# Patient Record
Sex: Female | Born: 1965 | Race: Black or African American | Hispanic: No | Marital: Single | Smoking: Current some day smoker
Health system: Southern US, Community
[De-identification: ages and names within clinical notes are randomized; demographics above are authoritative.]

## PROBLEM LIST (undated history)

## (undated) DIAGNOSIS — J449 Chronic obstructive pulmonary disease, unspecified: Secondary | ICD-10-CM

## (undated) DIAGNOSIS — R569 Unspecified convulsions: Secondary | ICD-10-CM

## (undated) DIAGNOSIS — D128 Benign neoplasm of rectum: Secondary | ICD-10-CM

## (undated) DIAGNOSIS — I509 Heart failure, unspecified: Secondary | ICD-10-CM

## (undated) DIAGNOSIS — G8929 Other chronic pain: Secondary | ICD-10-CM

## (undated) DIAGNOSIS — K222 Esophageal obstruction: Secondary | ICD-10-CM

## (undated) DIAGNOSIS — J939 Pneumothorax, unspecified: Secondary | ICD-10-CM

## (undated) DIAGNOSIS — E119 Type 2 diabetes mellitus without complications: Secondary | ICD-10-CM

## (undated) DIAGNOSIS — R200 Anesthesia of skin: Secondary | ICD-10-CM

## (undated) DIAGNOSIS — E669 Obesity, unspecified: Secondary | ICD-10-CM

## (undated) DIAGNOSIS — K635 Polyp of colon: Secondary | ICD-10-CM

## (undated) DIAGNOSIS — K5790 Diverticulosis of intestine, part unspecified, without perforation or abscess without bleeding: Secondary | ICD-10-CM

## (undated) DIAGNOSIS — I10 Essential (primary) hypertension: Secondary | ICD-10-CM

## (undated) DIAGNOSIS — M1990 Unspecified osteoarthritis, unspecified site: Secondary | ICD-10-CM

## (undated) HISTORY — DX: Chronic obstructive pulmonary disease, unspecified: J44.9

## (undated) HISTORY — DX: Diverticulosis of intestine, part unspecified, without perforation or abscess without bleeding: K57.90

## (undated) HISTORY — PX: TUBAL LIGATION: SHX77

## (undated) HISTORY — DX: Unspecified osteoarthritis, unspecified site: M19.90

## (undated) HISTORY — DX: Heart failure, unspecified: I50.9

## (undated) HISTORY — PX: CHEST TUBE INSERTION: SHX231

## (undated) HISTORY — DX: Polyp of colon: K63.5

## (undated) HISTORY — DX: Type 2 diabetes mellitus without complications: E11.9

## (undated) HISTORY — DX: Benign neoplasm of rectum: D12.8

## (undated) HISTORY — DX: Esophageal obstruction: K22.2

---

## 1998-01-05 ENCOUNTER — Emergency Department (HOSPITAL_COMMUNITY): Admission: EM | Admit: 1998-01-05 | Discharge: 1998-01-05 | Payer: Self-pay | Admitting: *Deleted

## 2004-03-16 ENCOUNTER — Emergency Department (HOSPITAL_COMMUNITY): Admission: EM | Admit: 2004-03-16 | Discharge: 2004-03-16 | Payer: Self-pay | Admitting: Family Medicine

## 2004-04-22 ENCOUNTER — Emergency Department (HOSPITAL_COMMUNITY): Admission: EM | Admit: 2004-04-22 | Discharge: 2004-04-23 | Payer: Self-pay

## 2004-07-23 ENCOUNTER — Emergency Department (HOSPITAL_COMMUNITY): Admission: EM | Admit: 2004-07-23 | Discharge: 2004-07-23 | Payer: Self-pay | Admitting: Emergency Medicine

## 2004-09-22 ENCOUNTER — Emergency Department (HOSPITAL_COMMUNITY): Admission: EM | Admit: 2004-09-22 | Discharge: 2004-09-22 | Payer: Self-pay | Admitting: Family Medicine

## 2005-01-11 ENCOUNTER — Emergency Department (HOSPITAL_COMMUNITY): Admission: EM | Admit: 2005-01-11 | Discharge: 2005-01-11 | Payer: Self-pay | Admitting: Emergency Medicine

## 2005-01-27 ENCOUNTER — Emergency Department (HOSPITAL_COMMUNITY): Admission: EM | Admit: 2005-01-27 | Discharge: 2005-01-27 | Payer: Self-pay | Admitting: Family Medicine

## 2005-01-29 ENCOUNTER — Emergency Department (HOSPITAL_COMMUNITY): Admission: EM | Admit: 2005-01-29 | Discharge: 2005-01-29 | Payer: Self-pay | Admitting: Family Medicine

## 2005-08-13 ENCOUNTER — Emergency Department (HOSPITAL_COMMUNITY): Admission: EM | Admit: 2005-08-13 | Discharge: 2005-08-13 | Payer: Self-pay | Admitting: Emergency Medicine

## 2006-06-09 ENCOUNTER — Ambulatory Visit: Payer: Self-pay | Admitting: Nurse Practitioner

## 2006-07-26 ENCOUNTER — Emergency Department (HOSPITAL_COMMUNITY): Admission: EM | Admit: 2006-07-26 | Discharge: 2006-07-26 | Payer: Self-pay | Admitting: Emergency Medicine

## 2006-07-30 ENCOUNTER — Emergency Department (HOSPITAL_COMMUNITY): Admission: EM | Admit: 2006-07-30 | Discharge: 2006-07-30 | Payer: Self-pay | Admitting: Family Medicine

## 2006-08-02 ENCOUNTER — Emergency Department (HOSPITAL_COMMUNITY): Admission: EM | Admit: 2006-08-02 | Discharge: 2006-08-02 | Payer: Self-pay | Admitting: Family Medicine

## 2006-08-04 ENCOUNTER — Emergency Department (HOSPITAL_COMMUNITY): Admission: EM | Admit: 2006-08-04 | Discharge: 2006-08-04 | Payer: Self-pay | Admitting: Family Medicine

## 2006-10-10 ENCOUNTER — Emergency Department (HOSPITAL_COMMUNITY): Admission: EM | Admit: 2006-10-10 | Discharge: 2006-10-10 | Payer: Self-pay | Admitting: Emergency Medicine

## 2006-12-09 ENCOUNTER — Emergency Department (HOSPITAL_COMMUNITY): Admission: EM | Admit: 2006-12-09 | Discharge: 2006-12-09 | Payer: Self-pay | Admitting: Emergency Medicine

## 2006-12-11 ENCOUNTER — Emergency Department (HOSPITAL_COMMUNITY): Admission: EM | Admit: 2006-12-11 | Discharge: 2006-12-11 | Payer: Self-pay | Admitting: Family Medicine

## 2007-03-11 ENCOUNTER — Emergency Department (HOSPITAL_COMMUNITY): Admission: EM | Admit: 2007-03-11 | Discharge: 2007-03-11 | Payer: Self-pay | Admitting: Emergency Medicine

## 2007-05-07 ENCOUNTER — Emergency Department (HOSPITAL_COMMUNITY): Admission: EM | Admit: 2007-05-07 | Discharge: 2007-05-07 | Payer: Self-pay | Admitting: Emergency Medicine

## 2007-05-19 ENCOUNTER — Emergency Department (HOSPITAL_COMMUNITY): Admission: EM | Admit: 2007-05-19 | Discharge: 2007-05-19 | Payer: Self-pay | Admitting: Emergency Medicine

## 2007-06-07 ENCOUNTER — Encounter: Admission: RE | Admit: 2007-06-07 | Discharge: 2007-09-05 | Payer: Self-pay | Admitting: Internal Medicine

## 2007-06-23 ENCOUNTER — Encounter: Admission: RE | Admit: 2007-06-23 | Discharge: 2007-06-23 | Payer: Self-pay | Admitting: Occupational Medicine

## 2008-02-13 ENCOUNTER — Emergency Department (HOSPITAL_COMMUNITY): Admission: EM | Admit: 2008-02-13 | Discharge: 2008-02-13 | Payer: Self-pay | Admitting: Emergency Medicine

## 2008-03-07 ENCOUNTER — Emergency Department (HOSPITAL_COMMUNITY): Admission: EM | Admit: 2008-03-07 | Discharge: 2008-03-08 | Payer: Self-pay | Admitting: Emergency Medicine

## 2008-03-11 ENCOUNTER — Emergency Department (HOSPITAL_COMMUNITY): Admission: EM | Admit: 2008-03-11 | Discharge: 2008-03-11 | Payer: Self-pay | Admitting: Family Medicine

## 2008-04-11 ENCOUNTER — Emergency Department (HOSPITAL_COMMUNITY): Admission: EM | Admit: 2008-04-11 | Discharge: 2008-04-11 | Payer: Self-pay | Admitting: Emergency Medicine

## 2008-04-26 ENCOUNTER — Emergency Department (HOSPITAL_COMMUNITY): Admission: EM | Admit: 2008-04-26 | Discharge: 2008-04-26 | Payer: Self-pay | Admitting: Family Medicine

## 2008-05-08 ENCOUNTER — Emergency Department (HOSPITAL_COMMUNITY): Admission: EM | Admit: 2008-05-08 | Discharge: 2008-05-08 | Payer: Self-pay | Admitting: Emergency Medicine

## 2008-05-17 ENCOUNTER — Emergency Department (HOSPITAL_COMMUNITY): Admission: EM | Admit: 2008-05-17 | Discharge: 2008-05-17 | Payer: Self-pay | Admitting: Family Medicine

## 2008-06-13 ENCOUNTER — Emergency Department (HOSPITAL_COMMUNITY): Admission: EM | Admit: 2008-06-13 | Discharge: 2008-06-13 | Payer: Self-pay | Admitting: Emergency Medicine

## 2008-06-15 ENCOUNTER — Emergency Department (HOSPITAL_COMMUNITY): Admission: EM | Admit: 2008-06-15 | Discharge: 2008-06-15 | Payer: Self-pay | Admitting: Emergency Medicine

## 2008-07-12 ENCOUNTER — Emergency Department (HOSPITAL_COMMUNITY): Admission: EM | Admit: 2008-07-12 | Discharge: 2008-07-12 | Payer: Self-pay | Admitting: Family Medicine

## 2008-11-23 ENCOUNTER — Emergency Department (HOSPITAL_COMMUNITY): Admission: EM | Admit: 2008-11-23 | Discharge: 2008-11-23 | Payer: Self-pay | Admitting: Emergency Medicine

## 2008-12-18 ENCOUNTER — Emergency Department (HOSPITAL_COMMUNITY): Admission: EM | Admit: 2008-12-18 | Discharge: 2008-12-19 | Payer: Self-pay | Admitting: Emergency Medicine

## 2008-12-21 ENCOUNTER — Emergency Department (HOSPITAL_COMMUNITY): Admission: EM | Admit: 2008-12-21 | Discharge: 2008-12-21 | Payer: Self-pay | Admitting: Emergency Medicine

## 2009-06-28 ENCOUNTER — Emergency Department (HOSPITAL_COMMUNITY): Admission: EM | Admit: 2009-06-28 | Discharge: 2009-06-29 | Payer: Self-pay | Admitting: Emergency Medicine

## 2009-08-29 ENCOUNTER — Emergency Department (HOSPITAL_COMMUNITY): Admission: EM | Admit: 2009-08-29 | Discharge: 2009-08-29 | Payer: Self-pay | Admitting: Family Medicine

## 2009-09-24 ENCOUNTER — Emergency Department (HOSPITAL_COMMUNITY): Admission: EM | Admit: 2009-09-24 | Discharge: 2009-09-24 | Payer: Self-pay | Admitting: Emergency Medicine

## 2010-08-14 ENCOUNTER — Emergency Department (HOSPITAL_COMMUNITY): Admission: EM | Admit: 2010-08-14 | Discharge: 2010-08-14 | Payer: Self-pay | Admitting: Emergency Medicine

## 2011-01-01 LAB — DIFFERENTIAL
Basophils Absolute: 0 10*3/uL (ref 0.0–0.1)
Basophils Relative: 0 % (ref 0–1)
Eosinophils Absolute: 0.5 10*3/uL (ref 0.0–0.7)
Eosinophils Relative: 5 % (ref 0–5)
Lymphocytes Relative: 32 % (ref 12–46)
Lymphs Abs: 3.3 10*3/uL (ref 0.7–4.0)
Monocytes Absolute: 0.5 10*3/uL (ref 0.1–1.0)
Monocytes Relative: 5 % (ref 3–12)
Neutro Abs: 5.8 10*3/uL (ref 1.7–7.7)
Neutrophils Relative %: 58 % (ref 43–77)

## 2011-01-01 LAB — GC/CHLAMYDIA PROBE AMP, GENITAL
Chlamydia, DNA Probe: NEGATIVE
GC Probe Amp, Genital: NEGATIVE

## 2011-01-01 LAB — CBC
HCT: 35.5 % — ABNORMAL LOW (ref 36.0–46.0)
Hemoglobin: 12.2 g/dL (ref 12.0–15.0)
MCHC: 34.4 g/dL (ref 30.0–36.0)
MCV: 93.7 fL (ref 78.0–100.0)
Platelets: 282 10*3/uL (ref 150–400)
RBC: 3.79 MIL/uL — ABNORMAL LOW (ref 3.87–5.11)
RDW: 13.9 % (ref 11.5–15.5)
WBC: 10.1 10*3/uL (ref 4.0–10.5)

## 2011-01-01 LAB — WET PREP, GENITAL
Trich, Wet Prep: NONE SEEN
Yeast Wet Prep HPF POC: NONE SEEN

## 2011-01-01 LAB — RPR: RPR Ser Ql: NONREACTIVE

## 2011-01-08 LAB — URINE MICROSCOPIC-ADD ON

## 2011-01-08 LAB — URINALYSIS, ROUTINE W REFLEX MICROSCOPIC
Bilirubin Urine: NEGATIVE
Glucose, UA: NEGATIVE mg/dL
Ketones, ur: NEGATIVE mg/dL
Nitrite: NEGATIVE
Protein, ur: NEGATIVE mg/dL
Specific Gravity, Urine: 1.037 — ABNORMAL HIGH (ref 1.005–1.030)
Urobilinogen, UA: 1 mg/dL (ref 0.0–1.0)
pH: 6 (ref 5.0–8.0)

## 2011-01-08 LAB — POCT PREGNANCY, URINE: Preg Test, Ur: NEGATIVE

## 2011-01-08 LAB — URINE CULTURE: Colony Count: 15000

## 2011-01-08 LAB — CULTURE, ROUTINE-ABSCESS

## 2011-04-15 ENCOUNTER — Emergency Department (HOSPITAL_COMMUNITY)
Admission: EM | Admit: 2011-04-15 | Discharge: 2011-04-16 | Disposition: A | Discharge status: Home / Self Care | Payer: No Typology Code available for payment source | Attending: Emergency Medicine | Admitting: Emergency Medicine

## 2011-04-15 DIAGNOSIS — M545 Low back pain, unspecified: Secondary | ICD-10-CM | POA: Insufficient documentation

## 2011-04-15 DIAGNOSIS — S335XXA Sprain of ligaments of lumbar spine, initial encounter: Secondary | ICD-10-CM | POA: Insufficient documentation

## 2011-04-15 DIAGNOSIS — M25559 Pain in unspecified hip: Secondary | ICD-10-CM | POA: Insufficient documentation

## 2011-04-15 DIAGNOSIS — M25519 Pain in unspecified shoulder: Secondary | ICD-10-CM | POA: Insufficient documentation

## 2011-04-15 DIAGNOSIS — M1990 Unspecified osteoarthritis, unspecified site: Secondary | ICD-10-CM | POA: Insufficient documentation

## 2011-04-15 DIAGNOSIS — G8929 Other chronic pain: Secondary | ICD-10-CM | POA: Insufficient documentation

## 2011-04-16 ENCOUNTER — Emergency Department (HOSPITAL_COMMUNITY): Payer: Self-pay

## 2011-05-10 ENCOUNTER — Emergency Department (HOSPITAL_COMMUNITY)
Admission: EM | Admit: 2011-05-10 | Discharge: 2011-05-11 | Disposition: A | Discharge status: Home / Self Care | Payer: Self-pay | Attending: Emergency Medicine | Admitting: Emergency Medicine

## 2011-05-10 DIAGNOSIS — Y92009 Unspecified place in unspecified non-institutional (private) residence as the place of occurrence of the external cause: Secondary | ICD-10-CM | POA: Insufficient documentation

## 2011-05-10 DIAGNOSIS — W269XXA Contact with unspecified sharp object(s), initial encounter: Secondary | ICD-10-CM | POA: Insufficient documentation

## 2011-05-10 DIAGNOSIS — S0180XA Unspecified open wound of other part of head, initial encounter: Secondary | ICD-10-CM | POA: Insufficient documentation

## 2011-05-14 ENCOUNTER — Emergency Department (HOSPITAL_COMMUNITY)
Admission: EM | Admit: 2011-05-14 | Discharge: 2011-05-14 | Disposition: A | Discharge status: Home / Self Care | Payer: Self-pay | Attending: Emergency Medicine | Admitting: Emergency Medicine

## 2011-05-14 DIAGNOSIS — Z4802 Encounter for removal of sutures: Secondary | ICD-10-CM | POA: Insufficient documentation

## 2011-06-25 LAB — DIFFERENTIAL
Basophils Absolute: 0
Basophils Relative: 0
Eosinophils Absolute: 0.2
Eosinophils Relative: 2
Lymphocytes Relative: 21
Lymphs Abs: 3.1
Monocytes Absolute: 0.7
Monocytes Relative: 5
Neutro Abs: 10.9 — ABNORMAL HIGH
Neutrophils Relative %: 73

## 2011-06-25 LAB — CBC
HCT: 36.7
Hemoglobin: 12.6
MCHC: 34.4
MCV: 91.7
Platelets: 305
RBC: 4
RDW: 13.6
WBC: 15.1 — ABNORMAL HIGH

## 2011-06-25 LAB — POCT I-STAT, CHEM 8
BUN: 8
Calcium, Ion: 1.22
Chloride: 101
Creatinine, Ser: 0.9
Glucose, Bld: 104 — ABNORMAL HIGH
HCT: 40
Hemoglobin: 13.6
Potassium: 3.4 — ABNORMAL LOW
Sodium: 135
TCO2: 28

## 2011-06-26 LAB — POCT CARDIAC MARKERS
CKMB, poc: 3.1
CKMB, poc: <1 — ABNORMAL LOW
Myoglobin, poc: 61.4
Operator id: 272551
Troponin i, poc: <0.05
Troponin i, poc: <0.05

## 2011-06-29 LAB — GLUCOSE, CAPILLARY

## 2011-07-10 LAB — POCT URINALYSIS DIP (DEVICE)
Bilirubin Urine: NEGATIVE
Glucose, UA: NEGATIVE
Ketones, ur: NEGATIVE
Nitrite: NEGATIVE
Operator id: 247071
pH: 6

## 2011-07-10 LAB — URINE CULTURE
Colony Count: NO GROWTH
Culture: NO GROWTH

## 2011-07-10 LAB — WET PREP, GENITAL
Clue Cells Wet Prep HPF POC: NONE SEEN
WBC, Wet Prep HPF POC: NONE SEEN

## 2011-07-10 LAB — POCT PREGNANCY, URINE: Preg Test, Ur: NEGATIVE

## 2011-07-10 LAB — GC/CHLAMYDIA PROBE AMP, GENITAL: Chlamydia, DNA Probe: NEGATIVE

## 2011-07-10 LAB — RPR: RPR Ser Ql: NONREACTIVE

## 2012-09-16 ENCOUNTER — Encounter (HOSPITAL_COMMUNITY): Payer: Self-pay | Admitting: Adult Health

## 2012-09-16 ENCOUNTER — Emergency Department (HOSPITAL_COMMUNITY)
Admission: EM | Admit: 2012-09-16 | Discharge: 2012-09-17 | Disposition: A | Discharge status: Home / Self Care | Payer: Self-pay | Attending: Emergency Medicine | Admitting: Emergency Medicine

## 2012-09-16 DIAGNOSIS — M545 Low back pain, unspecified: Secondary | ICD-10-CM | POA: Insufficient documentation

## 2012-09-16 DIAGNOSIS — E669 Obesity, unspecified: Secondary | ICD-10-CM | POA: Insufficient documentation

## 2012-09-16 DIAGNOSIS — Z8709 Personal history of other diseases of the respiratory system: Secondary | ICD-10-CM | POA: Insufficient documentation

## 2012-09-16 DIAGNOSIS — F17200 Nicotine dependence, unspecified, uncomplicated: Secondary | ICD-10-CM | POA: Insufficient documentation

## 2012-09-16 DIAGNOSIS — M25519 Pain in unspecified shoulder: Secondary | ICD-10-CM | POA: Insufficient documentation

## 2012-09-16 DIAGNOSIS — M79609 Pain in unspecified limb: Secondary | ICD-10-CM | POA: Insufficient documentation

## 2012-09-16 DIAGNOSIS — G8929 Other chronic pain: Secondary | ICD-10-CM | POA: Insufficient documentation

## 2012-09-16 DIAGNOSIS — M549 Dorsalgia, unspecified: Secondary | ICD-10-CM

## 2012-09-16 HISTORY — DX: Pneumothorax, unspecified: J93.9

## 2012-09-16 NOTE — ED Notes (Signed)
Presents with left leg numbness from the hip to the top of the knee. The numbness began 2 months ago. Pt also c/o right pointer and middle finger numbness that began 2 months ago as well. CMS intact. Pt is ambulatory. She reports that she can not lift both arms up without shoulder discomfort this began when the cold weather came. Pt is ambulatory, alert, oriented and MAEx4.

## 2012-09-16 NOTE — ED Notes (Signed)
Pt with multi. Complaints.  St's she has been having bil shoulder pain for several months st's at times she has pain just lifting a bottle of water.  Pt c/o bil knee pain with numbness in left hip down to knee, st's she has arthritis.  Pt also c/o  3 fingers on right hand being numb for a long time and st's finger nail are starting to come off.

## 2012-09-17 MED ORDER — TRAMADOL HCL 50 MG PO TABS
50.0000 mg | ORAL_TABLET | Freq: Once | ORAL | Status: AC
  Administered 2012-09-17: 50 mg via ORAL
  Filled 2012-09-17: qty 1

## 2012-09-17 MED ORDER — TRAMADOL HCL 50 MG PO TABS: 50.0000 mg | ORAL_TABLET | Freq: Four times a day (QID) | ORAL | Status: DC | PRN

## 2012-09-17 NOTE — ED Provider Notes (Signed)
Medical screening examination/treatment/procedure(s) were performed by non-physician practitioner and as supervising physician I was immediately available for consultation/collaboration.  Olivia Mackie, MD 09/17/12 [phone]

## 2012-09-17 NOTE — ED Provider Notes (Signed)
History     CSN: [id number]  Arrival date & time 09/16/12  2225   First MD Initiated Contact with Patient 09/16/12 2320      Chief Complaint  Patient presents with  . Numbness    (Consider location/radiation/quality/duration/timing/severity/associated sxs/prior treatment) HPI Comments: This is a 46 year old female, who presents emergency department with chief complaint of chronic leg pain and shoulder pain. Patient denies any health problems. She is not in any acute distress. She states that her pain as 5/10. She requests to be checked for diabetes. She's not fasting. Patient has managed her pain with Tylenol. She states that the pain is worsened with cold weather. She denies headache, chest pain, shortness of breath, nausea, vomiting, diarrhea, constipation.  The history is provided by the patient. No language interpreter was used.    Past Medical History  Diagnosis Date  . Pneumothorax     History reviewed. No pertinent past surgical history.  History reviewed. No pertinent family history.  History  Substance Use Topics  . Smoking status: Current Some Day Smoker  . Smokeless tobacco: Not on file  . Alcohol Use: Yes    OB History    Grav Para Term Preterm Abortions TAB SAB Ect Mult Living                  Review of Systems  All other systems reviewed and are negative.    Allergies  Review of patient's allergies indicates no known allergies.  Home Medications   Current Outpatient Rx  Name  Route  Sig  Dispense  Refill  . TRAMADOL HCL 50 MG PO TABS   Oral   Take 1 tablet (50 mg total) by mouth every 6 (six) hours as needed for pain.   15 tablet   0     BP 131/71  Pulse 80  Temp 98.5 F (36.9 C) (Oral)  Resp 18  SpO2 99%  Physical Exam  Nursing note and vitals reviewed. Constitutional: She is oriented to person, place, and time. She appears well-developed and well-nourished.       Obese  HENT:  Head: Normocephalic and atraumatic.  Eyes:  Conjunctivae normal and EOM are normal. Pupils are equal, round, and reactive to light.  Neck: Normal range of motion. Neck supple.  Cardiovascular: Normal rate and regular rhythm.  Exam reveals no gallop and no friction rub.   No murmur heard. Pulmonary/Chest: Effort normal and breath sounds normal. No respiratory distress. She has no wheezes. She has no rales. She exhibits no tenderness.  Abdominal: Soft. Bowel sounds are normal. She exhibits no distension and no mass. There is no tenderness. There is no rebound and no guarding.  Musculoskeletal: Normal range of motion. She exhibits no edema and no tenderness.       Range of motion 5 out of 5, strength 5 out of 5 in all extremities. Tenderness to palpation in lumbar paraspinal muscles.  Neurological: She is alert and oriented to person, place, and time.       Sensation intact bilaterally.  Skin: Skin is warm and dry.  Psychiatric: She has a normal mood and affect. Her behavior is normal. Judgment and thought content normal.    ED Course  Procedures (including critical care time)  Labs Reviewed - No data to display No results found.   1. Back pain       MDM  46 year old female with chronic pain. I recommended that the patient followup with primary care. I've given her the resource  guide. She understands and agrees with the plan. Will also give her tramadol for pain. Patient is stable and ready for discharge. Vital signs and nurses notes been reviewed.        Roxy Horseman, PA-C 09/17/12 [phone]

## 2013-03-22 ENCOUNTER — Emergency Department (HOSPITAL_COMMUNITY)
Admission: EM | Admit: 2013-03-22 | Discharge: 2013-03-22 | Disposition: A | Discharge status: Home / Self Care | Payer: Self-pay | Source: Home / Self Care

## 2013-03-30 ENCOUNTER — Encounter (HOSPITAL_COMMUNITY): Payer: Self-pay | Admitting: Emergency Medicine

## 2013-03-30 ENCOUNTER — Emergency Department (INDEPENDENT_AMBULATORY_CARE_PROVIDER_SITE_OTHER)
Admission: EM | Admit: 2013-03-30 | Discharge: 2013-03-30 | Disposition: A | Discharge status: Home / Self Care | Payer: Self-pay | Source: Home / Self Care | Attending: Emergency Medicine | Admitting: Emergency Medicine

## 2013-03-30 DIAGNOSIS — E611 Iron deficiency: Secondary | ICD-10-CM

## 2013-03-30 DIAGNOSIS — R5383 Other fatigue: Secondary | ICD-10-CM

## 2013-03-30 DIAGNOSIS — R5381 Other malaise: Secondary | ICD-10-CM

## 2013-03-30 DIAGNOSIS — D509 Iron deficiency anemia, unspecified: Secondary | ICD-10-CM

## 2013-03-30 LAB — CBC WITH DIFFERENTIAL/PLATELET
Basophils Absolute: 0 10*3/uL (ref 0.0–0.1)
Basophils Relative: 0 % (ref 0–1)
Eosinophils Relative: 4 % (ref 0–5)
HCT: 43.6 % (ref 36.0–46.0)
Hemoglobin: 15.1 g/dL — ABNORMAL HIGH (ref 12.0–15.0)
MCH: 31.2 pg (ref 26.0–34.0)
MCHC: 34.6 g/dL (ref 30.0–36.0)
MCV: 90.1 fL (ref 78.0–100.0)
Monocytes Absolute: 0.5 10*3/uL (ref 0.1–1.0)
Monocytes Relative: 5 % (ref 3–12)
Neutro Abs: 6.6 10*3/uL (ref 1.7–7.7)
RDW: 13.5 % (ref 11.5–15.5)

## 2013-03-30 LAB — COMPREHENSIVE METABOLIC PANEL
Albumin: 3.5 g/dL (ref 3.5–5.2)
BUN: 15 mg/dL (ref 6–23)
CO2: 25 mEq/L (ref 19–32)
Calcium: 9.2 mg/dL (ref 8.4–10.5)
Chloride: 102 mEq/L (ref 96–112)
Creatinine, Ser: 0.63 mg/dL (ref 0.50–1.10)
GFR calc non Af Amer: >90 mL/min (ref >90)
Total Bilirubin: 0.3 mg/dL (ref 0.3–1.2)

## 2013-03-30 NOTE — ED Provider Notes (Signed)
Medical screening examination/treatment/procedure(s) were performed by non-physician practitioner and as supervising physician I was immediately available for consultation/collaboration.  Naia Ruff, M.D.  Tonda Wiederhold C Avis Mcmahill, MD 03/30/13 1933 

## 2013-03-30 NOTE — ED Provider Notes (Signed)
History    CSN: [id number] Arrival date & time 03/30/13  1439  First MD Initiated Contact with Patient 03/30/13 1557     Chief Complaint  Patient presents with  . Labs Only   (Consider location/radiation/quality/duration/timing/severity/associated sxs/prior Treatment) HPI Comments: 47 year old female presents requesting to have her iron levels checked. She also denies plasma and states that her hematocrits have been falling. She also states she has been getting tired a lot more recently. She has tried changing her diet to maximize her iron consumption and has also tried taking oral iron supplements but these do not seem to have helped her much. She denies any bleeding, dark stools, hematuria. She does admit to sometimes having very frequent bowel movements, but never dark or bloody.  Past Medical History  Diagnosis Date  . Pneumothorax    History reviewed. No pertinent past surgical history. No family history on file. History  Substance Use Topics  . Smoking status: Current Some Day Smoker  . Smokeless tobacco: Not on file  . Alcohol Use: Yes   OB History   Grav Para Term Preterm Abortions TAB SAB Ect Mult Living                 Review of Systems  Constitutional: Positive for fatigue. Negative for fever and chills.  Eyes: Negative for visual disturbance.  Respiratory: Negative for cough and shortness of breath.   Cardiovascular: Negative for chest pain, palpitations and leg swelling.  Gastrointestinal: Negative for nausea, vomiting and abdominal pain.  Endocrine: Negative for polydipsia and polyuria.  Genitourinary: Negative for dysuria, urgency and frequency.  Musculoskeletal: Negative for myalgias and arthralgias.  Skin: Negative for rash.  Neurological: Negative for dizziness, weakness and light-headedness.    Allergies  Review of patient's allergies indicates no known allergies.  Home Medications   Current Outpatient Rx  Name  Route  Sig  Dispense  Refill  .  traMADol (ULTRAM) 50 MG tablet   Oral   Take 1 tablet (50 mg total) by mouth every 6 (six) hours as needed for pain.   15 tablet   0    BP 131/67  Pulse 75  Temp(Src) 98 F (36.7 C) (Oral)  Resp 16  SpO2 100%  LMP 02/28/2013 Physical Exam  Nursing note and vitals reviewed. Constitutional: She is oriented to person, place, and time. Vital signs are normal. She appears well-developed and well-nourished. No distress.  Morbidly obese habitus  HENT:  Head: Atraumatic.  Eyes: EOM are normal. Pupils are equal, round, and reactive to light.  Cardiovascular: Normal rate, regular rhythm and normal heart sounds.  Exam reveals no gallop and no friction rub.   No murmur heard. Pulmonary/Chest: Effort normal and breath sounds normal. No respiratory distress. She has no wheezes. She has no rales.  Abdominal: Soft. There is no tenderness.  Neurological: She is alert and oriented to person, place, and time. She has normal strength.  Skin: Skin is warm and dry. She is not diaphoretic.  Psychiatric: She has a normal mood and affect. Her behavior is normal. Judgment normal.    ED Course  Procedures (including critical care time) Labs Reviewed  CBC WITH DIFFERENTIAL - Abnormal; Notable for the following:    WBC 10.8 (*)    Hemoglobin 15.1 (*)    All other components within normal limits  COMPREHENSIVE METABOLIC PANEL   No results found. 1. Fatigue   2. Iron deficiency     MDM  H&H are both normal. The CMP is  abnormal we will call her. Otherwise she should followup at [facility] for workup of fatigue.   Graylon Good, PA-C 03/30/13 1757

## 2013-03-30 NOTE — ED Notes (Signed)
Patient reports giving plasma routinely and recently the clinic has noted iron dropping and suggested she come to [facility] for evaluation

## 2013-05-19 ENCOUNTER — Emergency Department (HOSPITAL_COMMUNITY)
Admission: EM | Admit: 2013-05-19 | Discharge: 2013-05-19 | Disposition: A | Discharge status: Home / Self Care | Payer: Self-pay | Attending: Emergency Medicine | Admitting: Emergency Medicine

## 2013-05-19 ENCOUNTER — Encounter (HOSPITAL_COMMUNITY): Payer: Self-pay | Admitting: Emergency Medicine

## 2013-05-19 DIAGNOSIS — M5430 Sciatica, unspecified side: Secondary | ICD-10-CM | POA: Insufficient documentation

## 2013-05-19 DIAGNOSIS — M5432 Sciatica, left side: Secondary | ICD-10-CM

## 2013-05-19 DIAGNOSIS — X58XXXA Exposure to other specified factors, initial encounter: Secondary | ICD-10-CM | POA: Insufficient documentation

## 2013-05-19 DIAGNOSIS — M7989 Other specified soft tissue disorders: Secondary | ICD-10-CM

## 2013-05-19 DIAGNOSIS — Y939 Activity, unspecified: Secondary | ICD-10-CM | POA: Insufficient documentation

## 2013-05-19 DIAGNOSIS — Y929 Unspecified place or not applicable: Secondary | ICD-10-CM | POA: Insufficient documentation

## 2013-05-19 DIAGNOSIS — M25569 Pain in unspecified knee: Secondary | ICD-10-CM | POA: Insufficient documentation

## 2013-05-19 DIAGNOSIS — S335XXA Sprain of ligaments of lumbar spine, initial encounter: Secondary | ICD-10-CM | POA: Insufficient documentation

## 2013-05-19 DIAGNOSIS — S39012A Strain of muscle, fascia and tendon of lower back, initial encounter: Secondary | ICD-10-CM

## 2013-05-19 DIAGNOSIS — M79662 Pain in left lower leg: Secondary | ICD-10-CM

## 2013-05-19 DIAGNOSIS — Z8709 Personal history of other diseases of the respiratory system: Secondary | ICD-10-CM | POA: Insufficient documentation

## 2013-05-19 DIAGNOSIS — M79609 Pain in unspecified limb: Secondary | ICD-10-CM | POA: Insufficient documentation

## 2013-05-19 DIAGNOSIS — F17200 Nicotine dependence, unspecified, uncomplicated: Secondary | ICD-10-CM | POA: Insufficient documentation

## 2013-05-19 MED ORDER — HYDROCODONE-ACETAMINOPHEN 5-325 MG PO TABS: 1.0000 | ORAL_TABLET | Freq: Four times a day (QID) | ORAL | Status: DC | PRN

## 2013-05-19 MED ORDER — CYCLOBENZAPRINE HCL 10 MG PO TABS: 10.0000 mg | ORAL_TABLET | Freq: Three times a day (TID) | ORAL | Status: DC | PRN

## 2013-05-19 MED ORDER — PREDNISONE 50 MG PO TABS: 50.0000 mg | ORAL_TABLET | Freq: Every day | ORAL | Status: DC

## 2013-05-19 NOTE — ED Provider Notes (Signed)
Medical screening examination/treatment/procedure(s) were performed by non-physician practitioner and as supervising physician I was immediately available for consultation/collaboration.    Bralee Feldt R Jazae Gandolfi, MD 05/19/13 1407 

## 2013-05-19 NOTE — ED Notes (Signed)
Pt c/o left leg numbness and swelling x 2 days.

## 2013-05-19 NOTE — ED Provider Notes (Signed)
  CSN: [id number]     Arrival date & time 05/19/13  1111 History     First MD Initiated Contact with Patient 05/19/13 1126     Chief Complaint  Patient presents with  . Leg Pain   (Consider location/radiation/quality/duration/timing/severity/associated sxs/prior Treatment) HPI Patient presents emergency department with left calf pain and lower back pain.  Patient, states, that her calf and lower leg has been swollen.  Patient, states, that she has pain with palpation of her lower back.  Patient denies numbness, dizziness, abdominal pain, headache, blurred vision, nausea, vomiting, diarrhea, dysuria, incontinence, fever, or syncope.  Patient, states, that she did not try any medications prior to arrival.  Patient, states, that she does stand on her feet a lot at work.  Patient, states, that nothing seems to make her condition, better, but movement and palpation make her pain, worse. Past Medical History  Diagnosis Date  . Pneumothorax    Past Surgical History  Procedure Laterality Date  . Chest tube insertion     History reviewed. No pertinent family history. History  Substance Use Topics  . Smoking status: Current Some Day Smoker  . Smokeless tobacco: Not on file  . Alcohol Use: Yes   OB History   Grav Para Term Preterm Abortions TAB SAB Ect Mult Living                 Review of Systems All other systems negative except as documented in the HPI. All pertinent positives and negatives as reviewed in the HPI. Allergies  Review of patient's allergies indicates no known allergies.  Home Medications   Current Outpatient Rx  Name  Route  Sig  Dispense  Refill  . naproxen sodium (ANAPROX) 220 MG tablet   Oral   Take 440 mg by mouth daily as needed (For pain).          BP 139/90  Pulse 84  Temp(Src) 98.7 F (37.1 C) (Oral)  Resp 18  SpO2 97% Physical Exam  Nursing note and vitals reviewed. Constitutional: She is oriented to person, place, and time. She appears  well-developed and well-nourished. No distress.  Eyes: Pupils are equal, round, and reactive to light.  Neck: Normal range of motion. Neck supple.  Musculoskeletal:       Lumbar back: She exhibits tenderness.       Back:  Patient has pain in her left calf with some mild swelling in the lower extremity  Neurological: She is alert and oriented to person, place, and time. No sensory deficit. She exhibits normal muscle tone. Coordination and gait normal.  Reflex Scores:      Patellar reflexes are 2+ on the right side and 2+ on the left side.      Achilles reflexes are 2+ on the right side and 2+ on the left side. Skin: Skin is warm and dry. No rash noted.    ED Course   Procedures (including critical care time) Patient had DVT study due to the calf pain.  Patient is advised to use ice and heat on her lower back.  Told to return here as needed.  The patient does not have any neurological deficits. MDM    Carlyle Dolly, PA-C 05/19/13 [phone]

## 2013-05-19 NOTE — Progress Notes (Signed)
Left lower extremity venous duplex completed.  Left:  No evidence of DVT, superficial thrombosis, or Baker's cyst.  Right:  Negative for DVT in the common femoral vein.  

## 2013-12-08 ENCOUNTER — Encounter (HOSPITAL_COMMUNITY): Payer: Self-pay | Admitting: Emergency Medicine

## 2013-12-08 ENCOUNTER — Emergency Department (HOSPITAL_COMMUNITY)
Admission: EM | Admit: 2013-12-08 | Discharge: 2013-12-08 | Disposition: A | Discharge status: Home / Self Care | Payer: BC Managed Care – PPO | Attending: Emergency Medicine | Admitting: Emergency Medicine

## 2013-12-08 ENCOUNTER — Emergency Department (HOSPITAL_COMMUNITY): Payer: BC Managed Care – PPO

## 2013-12-08 DIAGNOSIS — Z791 Long term (current) use of non-steroidal anti-inflammatories (NSAID): Secondary | ICD-10-CM | POA: Insufficient documentation

## 2013-12-08 DIAGNOSIS — M79672 Pain in left foot: Secondary | ICD-10-CM

## 2013-12-08 DIAGNOSIS — Z8709 Personal history of other diseases of the respiratory system: Secondary | ICD-10-CM | POA: Insufficient documentation

## 2013-12-08 DIAGNOSIS — M79609 Pain in unspecified limb: Secondary | ICD-10-CM | POA: Insufficient documentation

## 2013-12-08 DIAGNOSIS — F17200 Nicotine dependence, unspecified, uncomplicated: Secondary | ICD-10-CM | POA: Insufficient documentation

## 2013-12-08 DIAGNOSIS — IMO0002 Reserved for concepts with insufficient information to code with codable children: Secondary | ICD-10-CM | POA: Insufficient documentation

## 2013-12-08 DIAGNOSIS — M5380 Other specified dorsopathies, site unspecified: Secondary | ICD-10-CM | POA: Insufficient documentation

## 2013-12-08 MED ORDER — PREDNISONE 20 MG PO TABS: 40.0000 mg | ORAL_TABLET | Freq: Every day | ORAL | Status: DC

## 2013-12-08 MED ORDER — MELOXICAM 7.5 MG PO TABS: 15.0000 mg | ORAL_TABLET | Freq: Every day | ORAL | Status: DC

## 2013-12-08 MED ORDER — MELOXICAM 15 MG PO TABS
15.0000 mg | ORAL_TABLET | Freq: Once | ORAL | Status: AC
  Administered 2013-12-08: 15 mg via ORAL
  Filled 2013-12-08: qty 1

## 2013-12-08 NOTE — ED Notes (Signed)
Waiting on medication.

## 2013-12-08 NOTE — ED Provider Notes (Signed)
CSN: [id number]     Arrival date & time 12/08/13  1154 History  This chart was scribed for non-physician practitioner Fransisco Beau, PA-C working with Leota Jacobsen, MD by Eston Mould, ED Scribe. This patient was seen in room TR10C/TR10C and the patient's care was started at 1:25 PM .   Chief Complaint  Patient presents with  . Foot Pain   The history is provided by the patient. No language interpreter was used.   HPI Comments: Elizabeth Diaz is a 48 y.o. female who presents to the Emergency Department complaining of ongoing worsening L foot pain that began 2 weeks ago but has worsened recently over the last few days. She describes her pain as sharp. She endorses a burning sensation to the dorsum of her foot. She rates her current pain 8/10 and states walking worsens the pain. Pt states she has tried elevation, soaked in hot water, and has taken OTC Advil, Tylenol, Advar and states the swelling has gone down but she still complains of having pain. Pt denies fever and chills.  Pt also c/o of having lower back spasms lately while at work. She states she does strenuous lifting and work while at work and believes the back spasms are due to her work load. Denies any symptoms currently. No fevers, chills, bladder or bowel incontinence, numbness or weakness, hx of cancer or IVDA.   Past Medical History  Diagnosis Date  . Pneumothorax    Past Surgical History  Procedure Laterality Date  . Chest tube insertion     History reviewed. No pertinent family history. History  Substance Use Topics  . Smoking status: Current Some Day Smoker  . Smokeless tobacco: Not on file  . Alcohol Use: Yes   OB History   Grav Para Term Preterm Abortions TAB SAB Ect Mult Living                 Review of Systems  Constitutional: Negative for fever and chills.  Musculoskeletal: Positive for myalgias.  Skin: Negative for wound.  All other systems reviewed and are negative.   Allergies   Review of patient's allergies indicates no known allergies.  Home Medications   Current Outpatient Rx  Name  Route  Sig  Dispense  Refill  . cyclobenzaprine (FLEXERIL) 10 MG tablet   Oral   Take 1 tablet (10 mg total) by mouth 3 (three) times daily as needed for muscle spasms.   15 tablet   0   . HYDROcodone-acetaminophen (NORCO/VICODIN) 5-325 MG per tablet   Oral   Take 1 tablet by mouth every 6 (six) hours as needed for pain.   15 tablet   0   . meloxicam (MOBIC) 7.5 MG tablet   Oral   Take 2 tablets (15 mg total) by mouth daily.   20 tablet   0   . naproxen sodium (ANAPROX) 220 MG tablet   Oral   Take 440 mg by mouth daily as needed (For pain).         . predniSONE (DELTASONE) 20 MG tablet   Oral   Take 2 tablets (40 mg total) by mouth daily.   10 tablet   0   . predniSONE (DELTASONE) 50 MG tablet   Oral   Take 1 tablet (50 mg total) by mouth daily.   5 tablet   0    BP 125/60  Pulse 76  Temp(Src) 98.4 F (36.9 C) (Oral)  Resp 18  Ht 5\' 1"  (1.549 m)  Wt 265 lb (120.203 kg)  BMI 50.10 kg/m2  SpO2 100%  Physical Exam  Constitutional: She is oriented to person, place, and time. She appears well-developed and well-nourished. No distress.  HENT:  Head: Normocephalic and atraumatic.  Right Ear: External ear normal.  Left Ear: External ear normal.  Nose: Nose normal.  Mouth/Throat: Oropharynx is clear and moist. No oropharyngeal exudate.  Eyes: Conjunctivae and EOM are normal. Pupils are equal, round, and reactive to light.  Neck: Normal range of motion. Neck supple.  Cardiovascular: Normal rate, regular rhythm, normal heart sounds and intact distal pulses.   Pulmonary/Chest: Effort normal and breath sounds normal. No respiratory distress.  Abdominal: Soft. There is no tenderness.  Musculoskeletal: She exhibits no edema.       Right ankle: Normal.       Left ankle: Normal.       Cervical back: Normal.       Thoracic back: Normal.       Lumbar back:  Normal.       Right lower leg: Normal.       Left lower leg: Normal.       Right foot: Normal.       Left foot: She exhibits tenderness (dorsum) and swelling. She exhibits normal range of motion, no bony tenderness, normal capillary refill, no crepitus, no deformity and no laceration.  Neurological: She is alert and oriented to person, place, and time. She has normal strength. No cranial nerve deficit or sensory deficit. Gait normal. GCS eye subscore is 4. GCS verbal subscore is 5. GCS motor subscore is 6.  No pronator drift. Bilateral heel-knee-shin intact.  Skin: Skin is warm and dry. She is not diaphoretic.    ED Course  Procedures Medications  meloxicam (MOBIC) tablet 15 mg (15 mg Oral Given 12/08/13 1404)    DIAGNOSTIC STUDIES: Oxygen Saturation is 98% on RA, normal by my interpretation.    COORDINATION OF CARE: 1:30 PM-Discussed treatment plan which includes X-ray and administer pain medication while in ED. Pt agreed to plan.   1:35 PM-Informed pt of radiology findings, which WNL. Will discharge pt with medication and advised pt to F/U. Pt agreed to plan.  Labs Review Labs Reviewed - No data to display Imaging Review Dg Foot Complete Left  12/08/2013   CLINICAL DATA:  Foot pain without recent trauma  EXAM: LEFT FOOT - COMPLETE 3+ VIEW  COMPARISON:  None.  FINDINGS: There is no evidence of fracture or dislocation. There is no evidence of arthropathy or other focal bone abnormality. Soft tissues are unremarkable.  IMPRESSION: No acute abnormality noted.   Electronically Signed   By: Inez Catalina M.D.   On: 12/08/2013 13:06     EKG Interpretation None      MDM   Final diagnoses:  Left foot pain    Filed Vitals:   12/08/13 1405  BP: 125/60  Pulse: 76  Temp:   Resp: 18    Afebrile, NAD, non-toxic appearing, AAOx4.   1) Patient with back pain.  Normal neuro exam.  Patient can walk without pain. No spasms noted on exam.  No loss of bowel or bladder control.  No  concern for cauda equina.  No fever, night sweats, weight loss, h/o cancer, IVDU.    2) Neurovascularly intact. Normal sensation. Imaging shows no fracture. Directed pt to ice injury, take acetaminophen or ibuprofen for pain, and to elevate and rest the injury when possible. RICE protocol  discussed with patient. Prednisone and mobic  for pain.   I personally performed the services described in this documentation, which was scribed in my presence. The recorded information has been reviewed and is accurate.     Harlow Mares, PA-C 12/08/13 1654

## 2013-12-08 NOTE — ED Notes (Signed)
Pharmacy called

## 2013-12-08 NOTE — ED Notes (Signed)
States shes had pain and swelling in her L foot for past 2 weeks. Hurts to ambulate. Denies any injuries. Cms intact

## 2013-12-08 NOTE — Discharge Instructions (Signed)
Please follow up with your primary care physician in 1-2 days. If you do not have one please call the [facility] number listed above. Please take medications as prescribed. Please read all discharge instructions and return precautions.   Plantar Fasciitis Plantar fasciitis is a common condition that causes foot pain. It is soreness (inflammation) of the band of tough fibrous tissue on the bottom of the foot that runs from the heel bone (calcaneus) to the ball of the foot. The cause of this soreness may be from excessive standing, poor fitting shoes, running on hard surfaces, being overweight, having an abnormal walk, or overuse (this is common in runners) of the painful foot or feet. It is also common in aerobic exercise dancers and ballet dancers. SYMPTOMS  Most people with plantar fasciitis complain of:  Severe pain in the morning on the bottom of their foot especially when taking the first steps out of bed. This pain recedes after a few minutes of walking.  Severe pain is experienced also during walking following a long period of inactivity.  Pain is worse when walking barefoot or up stairs DIAGNOSIS   Your caregiver will diagnose this condition by examining and feeling your foot.  Special tests such as X-rays of your foot, are usually not needed. PREVENTION   Consult a sports medicine professional before beginning a new exercise program.  Walking programs offer a good workout. With walking there is a lower chance of overuse injuries common to runners. There is less impact and less jarring of the joints.  Begin all new exercise programs slowly. If problems or pain develop, decrease the amount of time or distance until you are at a comfortable level.  Wear good shoes and replace them regularly.  Stretch your foot and the heel cords at the back of the ankle (Achilles tendon) both before and after exercise.  Run or exercise on even surfaces that are not hard. For  example, asphalt is better than pavement.  Do not run barefoot on hard surfaces.  If using a treadmill, vary the incline.  Do not continue to workout if you have foot or joint problems. Seek professional help if they do not improve. HOME CARE INSTRUCTIONS   Avoid activities that cause you pain until you recover.  Use ice or cold packs on the problem or painful areas after working out.  Only take over-the-counter or prescription medicines for pain, discomfort, or fever as directed by your caregiver.  Soft shoe inserts or athletic shoes with air or gel sole cushions may be helpful.  If problems continue or become more severe, consult a sports medicine caregiver or your own health care provider. Cortisone is a potent anti-inflammatory medication that may be injected into the painful area. You can discuss this treatment with your caregiver. MAKE SURE YOU:   Understand these instructions.  Will watch your condition.  Will get help right away if you are not doing well or get worse. Document Released: 06/09/2001 Document Revised: 12/07/2011 Document Reviewed: 08/08/2008 [facility] Patient Information 2014 [place], [place].

## 2013-12-08 NOTE — ED Notes (Signed)
PT ambulated with baseline gait; VSS; A&Ox3; no signs of distress; respirations even and unlabored; skin warm and dry; no questions upon discharge.  

## 2013-12-11 NOTE — ED Provider Notes (Signed)
Medical screening examination/treatment/procedure(s) were performed by non-physician practitioner and as supervising physician I was immediately available for consultation/collaboration.   Leota Jacobsen, MD 12/11/13 [phone]

## 2014-02-14 ENCOUNTER — Encounter (HOSPITAL_COMMUNITY): Payer: Self-pay | Admitting: Emergency Medicine

## 2014-02-14 ENCOUNTER — Emergency Department (HOSPITAL_COMMUNITY)
Admission: EM | Admit: 2014-02-14 | Discharge: 2014-02-14 | Disposition: A | Discharge status: Home / Self Care | Payer: BC Managed Care – PPO | Attending: Emergency Medicine | Admitting: Emergency Medicine

## 2014-02-14 DIAGNOSIS — R5381 Other malaise: Secondary | ICD-10-CM | POA: Insufficient documentation

## 2014-02-14 DIAGNOSIS — R42 Dizziness and giddiness: Secondary | ICD-10-CM | POA: Insufficient documentation

## 2014-02-14 DIAGNOSIS — R5383 Other fatigue: Secondary | ICD-10-CM

## 2014-02-14 DIAGNOSIS — M79673 Pain in unspecified foot: Secondary | ICD-10-CM

## 2014-02-14 DIAGNOSIS — Z8709 Personal history of other diseases of the respiratory system: Secondary | ICD-10-CM | POA: Insufficient documentation

## 2014-02-14 DIAGNOSIS — R197 Diarrhea, unspecified: Secondary | ICD-10-CM

## 2014-02-14 DIAGNOSIS — R32 Unspecified urinary incontinence: Secondary | ICD-10-CM | POA: Insufficient documentation

## 2014-02-14 DIAGNOSIS — M25579 Pain in unspecified ankle and joints of unspecified foot: Secondary | ICD-10-CM | POA: Insufficient documentation

## 2014-02-14 DIAGNOSIS — Z87891 Personal history of nicotine dependence: Secondary | ICD-10-CM | POA: Insufficient documentation

## 2014-02-14 DIAGNOSIS — R509 Fever, unspecified: Secondary | ICD-10-CM | POA: Insufficient documentation

## 2014-02-14 LAB — CBC WITH DIFFERENTIAL/PLATELET
BASOS PCT: 0 % (ref 0–1)
Basophils Absolute: 0 10*3/uL (ref 0.0–0.1)
EOS ABS: 0.4 10*3/uL (ref 0.0–0.7)
EOS PCT: 5 % (ref 0–5)
HEMATOCRIT: 36.7 % (ref 36.0–46.0)
HEMOGLOBIN: 12 g/dL (ref 12.0–15.0)
Lymphocytes Relative: 31 % (ref 12–46)
Lymphs Abs: 2.8 10*3/uL (ref 0.7–4.0)
MCH: 30.2 pg (ref 26.0–34.0)
MCHC: 32.7 g/dL (ref 30.0–36.0)
MCV: 92.2 fL (ref 78.0–100.0)
MONO ABS: 0.5 10*3/uL (ref 0.1–1.0)
MONOS PCT: 6 % (ref 3–12)
NEUTROS PCT: 58 % (ref 43–77)
Neutro Abs: 5.1 10*3/uL (ref 1.7–7.7)
Platelets: 301 10*3/uL (ref 150–400)
RBC: 3.98 MIL/uL (ref 3.87–5.11)
RDW: 13.2 % (ref 11.5–15.5)
WBC: 8.8 10*3/uL (ref 4.0–10.5)

## 2014-02-14 LAB — URINALYSIS, ROUTINE W REFLEX MICROSCOPIC
Bilirubin Urine: NEGATIVE
GLUCOSE, UA: NEGATIVE mg/dL
Hgb urine dipstick: NEGATIVE
Ketones, ur: NEGATIVE mg/dL
LEUKOCYTES UA: NEGATIVE
Nitrite: NEGATIVE
PROTEIN: NEGATIVE mg/dL
Specific Gravity, Urine: 1.016 (ref 1.005–1.030)
Urobilinogen, UA: 1 mg/dL (ref 0.0–1.0)
pH: 8 (ref 5.0–8.0)

## 2014-02-14 LAB — CBG MONITORING, ED: Glucose-Capillary: 96 mg/dL (ref 70–99)

## 2014-02-14 LAB — BASIC METABOLIC PANEL
BUN: 10 mg/dL (ref 6–23)
CALCIUM: 9.4 mg/dL (ref 8.4–10.5)
CO2: 29 mEq/L (ref 19–32)
CREATININE: 0.65 mg/dL (ref 0.50–1.10)
Chloride: 104 mEq/L (ref 96–112)
Glucose, Bld: 106 mg/dL — ABNORMAL HIGH (ref 70–99)
Potassium: 4.2 mEq/L (ref 3.7–5.3)
Sodium: 142 mEq/L (ref 137–147)

## 2014-02-14 MED ORDER — LOPERAMIDE HCL 2 MG PO CAPS: 2.0000 mg | ORAL_CAPSULE | Freq: Four times a day (QID) | ORAL | Status: DC | PRN

## 2014-02-14 MED ORDER — NAPROXEN 500 MG PO TABS: 500.0000 mg | ORAL_TABLET | Freq: Two times a day (BID) | ORAL | Status: DC

## 2014-02-14 NOTE — ED Provider Notes (Signed)
CSN: [id number]     Arrival date & time 02/14/14  1527 History   First MD Initiated Contact with Patient 02/14/14 2030     Chief Complaint  Patient presents with  . Dizziness  . Foot Pain  . Urinary Incontinence   HPI Patient has been having trouble with foot pain for the last several months. Patient was seen previously in the emergency department in March. She had an x-ray of her foot that was normal. She states the pain is in her mid foot. It increases with activity. She has not noticed any trouble with swelling or redness. She does not recall any recent injuries.  She has also been having trouble with frequent loose stools. That time she has been incontinent.  She states she's having at least 4 episodes each morning. She denies any recent travel. She denies any fever. She denies any abdominal pain. She denies any antibiotics. She does not have any trouble with nausea or vomiting.  Patient also has had some trouble with generalized fatigue and drowsiness. Patient still able to walk without difficulty. She was unable to go to work tonight because of her symptoms. Past Medical History  Diagnosis Date  . Pneumothorax    Past Surgical History  Procedure Laterality Date  . Chest tube insertion     History reviewed. No pertinent family history. History  Substance Use Topics  . Smoking status: Former Research scientist (life sciences)  . Smokeless tobacco: Not on file  . Alcohol Use: Yes   OB History   Grav Para Term Preterm Abortions TAB SAB Ect Mult Living                 Review of Systems  All other systems reviewed and are negative.     Allergies  Review of patient's allergies indicates no known allergies.  Home Medications   Prior to Admission medications   Medication Sig Start Date End Date Taking? Authorizing Provider   BP 172/75  Pulse 78  Temp(Src) 98 F (36.7 C) (Oral)  Resp 18  Ht 5\' 1"  (1.549 m)  Wt 262 lb 9.6 oz (119.115 kg)  BMI 49.64 kg/m2  SpO2 100%  LMP 02/03/2014 Physical  Exam  Nursing note and vitals reviewed. Constitutional: She appears well-developed and well-nourished. No distress.  Morbidly obese  HENT:  Head: Normocephalic and atraumatic.  Right Ear: External ear normal.  Left Ear: External ear normal.  Eyes: Conjunctivae are normal. Right eye exhibits no discharge. Left eye exhibits no discharge. No scleral icterus.  Neck: Neck supple. No tracheal deviation present.  Cardiovascular: Normal rate, regular rhythm and intact distal pulses.   Pulmonary/Chest: Effort normal and breath sounds normal. No stridor. No respiratory distress. She has no wheezes. She has no rales.  Abdominal: Soft. Bowel sounds are normal. She exhibits no distension. There is no tenderness. There is no rebound and no guarding.  Musculoskeletal: She exhibits no edema.       Lumbar back: She exhibits tenderness. She exhibits no bony tenderness, no swelling and no edema.  Tenderness to palpation left midfoot, no erythema, strong dorsalis pedis pulse, cap refill brisk  Neurological: She is alert. She has normal strength. No cranial nerve deficit (no facial droop, extraocular movements intact, no slurred speech) or sensory deficit. She exhibits normal muscle tone. She displays no seizure activity. Coordination normal.  Equal strength bilateral lower extremities, patient is able to ambulate without difficulty  Skin: Skin is warm and dry. No rash noted.  Psychiatric: She has a normal  mood and affect.    ED Course  Procedures (including critical care time) Labs Review Labs Reviewed  BASIC METABOLIC PANEL - Abnormal; Notable for the following:    Glucose, Bld 106 (*)    All other components within normal limits  CBC WITH DIFFERENTIAL  URINALYSIS, ROUTINE W REFLEX MICROSCOPIC  CBG MONITORING, ED    Imaging Review No results found.   EKG Interpretation None      MDM   Final diagnoses:  Foot pain  Diarrhea    Patient has been having trouble with intermittent episodes of  loose stools. She's not having abdominal pain she said having any fever. She does not have any risk factors for Clostridium difficile. I will have her take antidiarrheal agents I recommend followup with primary care Dr.   She has been having pain in her foot for several months. Previously she had x-rays that were unremarkable. I recommend follow up with orthopedic Dr.  At this time there does not appear to be any evidence of an acute emergency medical condition and the patient appears stable for discharge with appropriate outpatient follow up.     Kathalene Frames, MD 02/14/14 2049

## 2014-02-14 NOTE — ED Notes (Signed)
Dr.Knapp at bedside  

## 2014-02-14 NOTE — Discharge Instructions (Signed)
Diarrhea Diarrhea is frequent loose and watery bowel movements. It can cause you to feel weak and dehydrated. Dehydration can cause you to become tired and thirsty, have a dry mouth, and have decreased urination that often is dark yellow. Diarrhea is a sign of another problem, most often an infection that will not last long. In most cases, diarrhea typically lasts 2 3 days. However, it can last longer if it is a sign of something more serious. It is important to treat your diarrhea as directed by your caregive to lessen or prevent future episodes of diarrhea. CAUSES  Some common causes include:  Gastrointestinal infections caused by viruses, bacteria, or parasites.  Food poisoning or food allergies.  Certain medicines, such as antibiotics, chemotherapy, and laxatives.  Artificial sweeteners and fructose.  Digestive disorders. HOME CARE INSTRUCTIONS  Ensure adequate fluid intake (hydration): have 1 cup (8 oz) of fluid for each diarrhea episode. Avoid fluids that contain simple sugars or sports drinks, fruit juices, whole milk products, and sodas. Your urine should be clear or pale yellow if you are drinking enough fluids. Hydrate with an oral rehydration solution that you can purchase at pharmacies, retail stores, and online. You can prepare an oral rehydration solution at home by mixing the following ingredients together:    tsp table salt.   tsp baking soda.   tsp salt substitute containing potassium chloride.  1  tablespoons sugar.  1 L (34 oz) of water.  Certain foods and beverages may increase the speed at which food moves through the gastrointestinal (GI) tract. These foods and beverages should be avoided and include:  Caffeinated and alcoholic beverages.  High-fiber foods, such as raw fruits and vegetables, nuts, seeds, and whole grain breads and cereals.  Foods and beverages sweetened with sugar alcohols, such as xylitol, sorbitol, and mannitol.  Some foods may be well  tolerated and may help thicken stool including:  Starchy foods, such as rice, toast, pasta, low-sugar cereal, oatmeal, grits, baked potatoes, crackers, and bagels.  Bananas.  Applesauce.  Add probiotic-rich foods to help increase healthy bacteria in the GI tract, such as yogurt and fermented milk products.  Wash your hands well after each diarrhea episode.  Only take over-the-counter or prescription medicines as directed by your caregiver.  Take a warm bath to relieve any burning or pain from frequent diarrhea episodes. SEEK IMMEDIATE MEDICAL CARE IF:   You are unable to keep fluids down.  You have persistent vomiting.  You have blood in your stool, or your stools are black and tarry.  You do not urinate in 6 8 hours, or there is only a small amount of very dark urine.  You have abdominal pain that increases or localizes.  You have weakness, dizziness, confusion, or lightheadedness.  You have a severe headache.  Your diarrhea gets worse or does not get better.  You have a fever or persistent symptoms for more than 2 3 days.  You have a fever and your symptoms suddenly get worse. MAKE SURE YOU:   Understand these instructions.  Will watch your condition.  Will get help right away if you are not doing well or get worse. Document Released: 09/04/2002 Document Revised: 08/31/2012 Document Reviewed: 05/22/2012 [facility] Patient Information 2014 [place], [place].  Diet and Irritable Bowel Syndrome  No cure has been found for irritable bowel syndrome (IBS). Many options are available to treat the symptoms. Your caregiver will give you the best treatments available for your symptoms. He or she will also encourage  you to manage stress and to make changes to your diet. You need to work with your caregiver and Registered Dietician to find the best combination of medicine, diet, counseling, and support to control your symptoms. The following are some diet suggestions. FOODS THAT  MAKE IBS WORSE  Fatty foods, such as [place] fries.  Milk products, such as cheese or ice cream.  Chocolate.  Alcohol.  Caffeine (found in coffee and some sodas).  Carbonated drinks, such as soda. If certain foods cause symptoms, you should eat less of them or stop eating them. FOOD JOURNAL   Keep a journal of the foods that seem to cause distress. Write down:  What you are eating during the day and when.  What problems you are having after eating.  When the symptoms occur in relation to your meals.  What foods always make you feel badly.  Take your notes with you to your caregiver to see if you should stop eating certain foods. FOODS THAT MAKE IBS BETTER Fiber reduces IBS symptoms, especially constipation, because it makes stools soft, bulky, and easier to pass. Fiber is found in bran, bread, cereal, beans, fruit, and vegetables. Examples of foods with fiber include:  Apples.  Peaches.  Pears.  Berries.  Figs.  Broccoli, raw.  Cabbage.  Carrots.  Raw peas.  Kidney beans.  Lima beans.  Whole-grain bread.  Whole-grain cereal. Add foods with fiber to your diet a little at a time. This will let your body get used to them. Too much fiber at once might cause gas and swelling of your abdomen. This can trigger symptoms in a person with IBS. Caregivers usually recommend a diet with enough fiber to produce soft, painless bowel movements. High fiber diets may cause gas and bloating. However, these symptoms often go away within a few weeks, as your body adjusts. In many cases, dietary fiber may lessen IBS symptoms, particularly constipation. However, it may not help pain or diarrhea. High fiber diets keep the colon mildly enlarged (distended) with the added fiber. This may help prevent spasms in the colon. Some forms of fiber also keep water in the stool, thereby preventing hard stools that are difficult to pass.  Besides telling you to eat more foods with fiber, your  caregiver may also tell you to get more fiber by taking a fiber pill or drinking water mixed with a special high fiber powder. An example of this is a natural fiber laxative containing psyllium seed.  TIPS  Large meals can cause cramping and diarrhea in people with IBS. If this happens to you, try eating 4 or 5 small meals a day, or try eating less at each of your usual 3 meals. It may also help if your meals are low in fat and high in carbohydrates. Examples of carbohydrates are pasta, rice, whole-grain breads and cereals, fruits, and vegetables.  If dairy products cause your symptoms to flare up, you can try eating less of those foods. You might be able to handle yogurt better than other dairy products, because it contains bacteria that helps with digestion. Dairy products are an important source of calcium and other nutrients. If you need to avoid dairy products, be sure to talk with a Registered Dietitian about getting these nutrients through other food sources.  Drink enough water and fluids to keep your urine clear or pale yellow. This is important, especially if you have diarrhea. FOR MORE INFORMATION  International Foundation for Functional Gastrointestinal Disorders: www.iffgd.Boundary  Information Clearinghouse: digestive.AmenCredit.is Document Released: 12/05/2003 Document Revised: 12/07/2011 Document Reviewed: 08/22/2007 [facility] Patient Information 2014 [place], [place].

## 2014-02-14 NOTE — ED Notes (Signed)
Pt reports 3 weeks of incontinence of b&b, dizziness, polydipsia, and left sided foot. Incontinence is new for pt, states it happens even when sleeping. Associated with drowsiness and generalized weakness. She is alert, oriented, MAEx4. No wound or injury to foot, CMS intact. Reports some mild headaches over the last few days.  GRips equal bilaterally. No droop or drift.

## 2014-05-04 ENCOUNTER — Encounter: Payer: Self-pay | Admitting: Family Medicine

## 2014-05-04 ENCOUNTER — Ambulatory Visit (INDEPENDENT_AMBULATORY_CARE_PROVIDER_SITE_OTHER): Payer: BC Managed Care – PPO | Admitting: Family Medicine

## 2014-05-04 VITALS — BP 130/70 | HR 74 | Temp 98.7°F | Wt 262.0 lb

## 2014-05-04 DIAGNOSIS — R159 Full incontinence of feces: Secondary | ICD-10-CM | POA: Diagnosis not present

## 2014-05-04 DIAGNOSIS — R52 Pain, unspecified: Secondary | ICD-10-CM | POA: Diagnosis not present

## 2014-05-04 NOTE — Patient Instructions (Addendum)
Thank you for coming in,   They will call you on Monday about the appointment for Gastroenterology.   Follow up with me in 2-4 weeks and we can address your other issues.    Please feel free to call with any questions or concerns at any time, at [phone]. --Dr. Raeford Razor
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
## 2014-05-06 ENCOUNTER — Encounter (HOSPITAL_COMMUNITY): Payer: Self-pay | Admitting: Emergency Medicine

## 2014-05-06 ENCOUNTER — Emergency Department (INDEPENDENT_AMBULATORY_CARE_PROVIDER_SITE_OTHER)
Admission: EM | Admit: 2014-05-06 | Discharge: 2014-05-06 | Disposition: A | Discharge status: Home / Self Care | Payer: BC Managed Care – PPO | Source: Home / Self Care | Attending: Emergency Medicine | Admitting: Emergency Medicine

## 2014-05-06 ENCOUNTER — Emergency Department (HOSPITAL_COMMUNITY)
Admission: EM | Admit: 2014-05-06 | Discharge: 2014-05-06 | Disposition: A | Discharge status: Home / Self Care | Payer: BC Managed Care – PPO | Attending: Emergency Medicine | Admitting: Emergency Medicine

## 2014-05-06 DIAGNOSIS — M65849 Other synovitis and tenosynovitis, unspecified hand: Secondary | ICD-10-CM

## 2014-05-06 DIAGNOSIS — M65839 Other synovitis and tenosynovitis, unspecified forearm: Secondary | ICD-10-CM

## 2014-05-06 DIAGNOSIS — M778 Other enthesopathies, not elsewhere classified: Secondary | ICD-10-CM

## 2014-05-06 DIAGNOSIS — Z52008 Unspecified donor, other blood: Secondary | ICD-10-CM | POA: Insufficient documentation

## 2014-05-06 DIAGNOSIS — R52 Pain, unspecified: Secondary | ICD-10-CM | POA: Insufficient documentation

## 2014-05-06 DIAGNOSIS — Z87891 Personal history of nicotine dependence: Secondary | ICD-10-CM | POA: Insufficient documentation

## 2014-05-06 DIAGNOSIS — R159 Full incontinence of feces: Secondary | ICD-10-CM | POA: Insufficient documentation

## 2014-05-06 DIAGNOSIS — Z8709 Personal history of other diseases of the respiratory system: Secondary | ICD-10-CM | POA: Insufficient documentation

## 2014-05-06 DIAGNOSIS — M25539 Pain in unspecified wrist: Secondary | ICD-10-CM | POA: Insufficient documentation

## 2014-05-06 DIAGNOSIS — Z0189 Encounter for other specified special examinations: Secondary | ICD-10-CM | POA: Insufficient documentation

## 2014-05-06 LAB — HEMOGLOBIN AND HEMATOCRIT, BLOOD
HCT: 35.9 % — ABNORMAL LOW (ref 36.0–46.0)
Hemoglobin: 12.2 g/dL (ref 12.0–15.0)

## 2014-05-06 MED ORDER — DICLOFENAC SODIUM 75 MG PO TBEC: 75.0000 mg | DELAYED_RELEASE_TABLET | Freq: Two times a day (BID) | ORAL | Status: DC

## 2014-05-06 NOTE — Assessment & Plan Note (Signed)
Sphincter tone diminished but sensation intact. FOBT negative performed in office. Doubt atraumatic anal sphincter weakness, could be traumatic but didn't ask about any foreign objects in rectum. Patient has never been pregnant. Reports having diarrhea and not constipation but could have fecal ball. Possible for IBS or IBD  - referral to GI.  - Discussed with Dr. Lindell Noe

## 2014-05-06 NOTE — Discharge Instructions (Signed)
Please be sure to follow up with your family doctor, Dr. Raeford Razor, or any other primary care facility for ongoing healthcare needs, including further need for routine blood work if requested by the plasma center.

## 2014-05-06 NOTE — Discharge Instructions (Signed)
You have tendonitis of your wrist.   Please wear the brace as much as you can at work. STOP the aleve. START diclofenac 1 pill twice a day for the next 2 weeks. Apply ice twice a day.  Follow up with your doctor in 1-2 weeks.

## 2014-05-06 NOTE — ED Provider Notes (Signed)
CSN: [id number]     Arrival date & time 05/06/14  1322 History  This chart was scribed for Noland Fordyce, PA-C, working with Virgel Manifold, MD by Steva Colder, ED Scribe. The patient was seen in room TR05C/TR05C at 1:44 PM.   Chief Complaint  Patient presents with  . labwork      The history is provided by the patient. No language interpreter was used.   HPI Comments: Elizabeth Diaz is a 48 y.o. female who presents to the Emergency Department requesting lab work.She states that she would like to have her hemoglobin checked so that she can began donating plasma again. She states that she has not been able to donated plasma in 3 months because her hemglobin was too low. She states that she came to the ED because everyone else was closed. She states that it has been low for 3 times in a row in the past several weeks. She states that she does not have any medical concerns. She states that she is having symptoms of left wrist pain that were treated earlier today at urgent care but states their lab was not open today. She denies CP, SOB, nausea, vomiting, fever, chills, weakness, and pallor. She adamantly states that all she wants is her hemoglobin to be checked so that she can began to donate plasma again. She states that she does have a PCP but just recently got one and cannot follow up "for a long time" 8/20, but plasma center will not allow her to donate as she has had 3 consecutive low hemaglobin.    Past Medical History  Diagnosis Date  . Pneumothorax    Past Surgical History  Procedure Laterality Date  . Chest tube insertion    . Tubal ligation     Family History  Problem Relation Age of Onset  . Heart failure Father   . Diabetes Maternal Grandmother   . Diabetes Paternal Grandfather   . Diabetes Father    History  Substance Use Topics  . Smoking status: Former Smoker -- 1.50 packs/day for 20 years    Types: Cigarettes    Quit date: 05/04/2009  . Smokeless tobacco: Not on file  .  Alcohol Use: Yes     Comment: drinks liquor every night. 2-3 glasses    OB History   Grav Para Term Preterm Abortions TAB SAB Ect Mult Living                 Review of Systems  Constitutional: Negative for fever and chills.  Respiratory: Negative for shortness of breath.   Cardiovascular: Negative for chest pain.  Gastrointestinal: Negative for nausea and vomiting.  Musculoskeletal: Positive for arthralgias (left wrist pain).  Skin: Negative for pallor.  Neurological: Negative for weakness.  All other systems reviewed and are negative.     Allergies  Review of patient's allergies indicates no known allergies.  Home Medications   Prior to Admission medications   Medication Sig Start Date End Date Taking? Authorizing Provider  diclofenac (VOLTAREN) 75 MG EC tablet Take 1 tablet (75 mg total) by mouth 2 (two) times daily. 05/06/14   Melony Overly, MD   BP 120/72  Pulse 72  Temp(Src) 98.6 F (37 C) (Oral)  Resp 16  Ht 5\' 1"  (1.549 m)  Wt 262 lb (118.842 kg)  BMI 49.53 kg/m2  SpO2 98%  LMP 05/04/2014  Physical Exam  Nursing note and vitals reviewed. Constitutional: She is oriented to person, place, and time. She  appears well-developed and well-nourished.  HENT:  Head: Normocephalic and atraumatic.  Eyes: EOM are normal.  Neck: Normal range of motion.  Cardiovascular: Normal rate.   Pulmonary/Chest: Effort normal.  Musculoskeletal: Normal range of motion.  Neurological: She is alert and oriented to person, place, and time.  Skin: Skin is warm and dry.  Psychiatric: She has a normal mood and affect. Her behavior is normal.    ED Course  Procedures (including critical care time) DIAGNOSTIC STUDIES: Oxygen Saturation is 98% on room air, normal by my interpretation.    COORDINATION OF CARE: 1:46 PM-Discussed treatment plan which includes hemoglobin and hematocrit labs with pt at bedside and pt agreed to plan.   Labs Review Labs Reviewed  HEMOGLOBIN AND HEMATOCRIT,  BLOOD - Abnormal; Notable for the following:    HCT 35.9 (*)    All other components within normal limits    Imaging Review No results found.   EKG Interpretation None      MDM   Final diagnoses:  Blood donor, plasma   Pt is requesting lab work for Hgb/Hct to allow her to donate plasma. Denies symptoms at this time.  Pt persistently requesting labs to be checked as she cannot f/u with PCP until 8/20 and all the other labs are closed today.  Medical records reviewed- no hx of anemia. Pt did not bring reported results from plasma center.  Hgb/Hct performed in ED: unremarkable.  Strongly encouraged PCP f/u for additional routine lab work as the ED is not an appropriate place for routine labs if she is not having any symptoms.   I personally performed the services described in this documentation, which was scribed in my presence. The recorded information has been reviewed and is accurate.    Noland Fordyce, PA-C 05/06/14 1656

## 2014-05-06 NOTE — ED Notes (Signed)
Pt  Reports       l  Hand  Pain     With  Some  Numbness  Present    denys  Any  specefic  Injury        But  She  States  Uses her hand  A  Lot   At  Work

## 2014-05-06 NOTE — Assessment & Plan Note (Signed)
Possible for polyarthritis due to shoulder weakness. Patient is overweight so possible for OA in hips, knees. May be due to deconditioning   - will continue to monitor  - may need to be lab testing if persisting.

## 2014-05-06 NOTE — ED Notes (Signed)
Pt would like to have her hemaglobin checked so the can start donating plasma again.  States she has not donated plasma in several months b/c her hgb was too low.  Has NO medical complaints.

## 2014-05-06 NOTE — ED Notes (Signed)
Wrist splint applied to left arm as instructed by Bridgett Larsson.

## 2014-05-06 NOTE — ED Provider Notes (Signed)
CSN: [id number]     Arrival date & time 05/06/14  1152 History   First MD Initiated Contact with Patient 05/06/14 1207     Chief Complaint  Patient presents with  . Hand Pain   (Consider location/radiation/quality/duration/timing/severity/associated sxs/prior Treatment) HPI She's a 48 year old woman here for evaluation of left wrist pain. She states this has been present for about a week. It is gradually worsening. It is located on the volar aspect of the wrist, worse on the radial side. She does report some intermittent numbness that will go into her hand. Also reports some weakness in her hand. No specific injury. 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 She reports multiple issues with tendinitis in the past.  Past Medical History  Diagnosis Date  . Pneumothorax    Past Surgical History  Procedure Laterality Date  . Chest tube insertion     Family History  Problem Relation Age of Onset  . Heart failure Father   . Diabetes Maternal Grandmother   . Diabetes Paternal Grandfather   . Diabetes Father    History  Substance Use Topics  . Smoking status: Former Smoker -- 1.50 packs/day for 20 years    Types: Cigarettes    Quit date: 05/04/2009  . Smokeless tobacco: Not on file  . Alcohol Use: Yes     Comment: drinks liquor every night. 2-3 glasses    OB History   Grav Para Term Preterm Abortions TAB SAB Ect Mult Living                 Review of Systems  Constitutional: Negative.   Musculoskeletal:       Left wrist pain    Allergies  Review of patient's allergies indicates no known allergies.  Home Medications   Prior to Admission medications   Medication Sig Start Date End Date Taking? Authorizing Provider  diclofenac (VOLTAREN) 75 MG EC tablet Take 1 tablet (75 mg total) by mouth 2 (two) times daily. 05/06/14   Melony Overly, MD   BP 141/73  Pulse 74  Temp(Src) 98.8 F (37.1 C) (Oral)  Resp 20  SpO2 98%   LMP 05/04/2014 Physical Exam  Constitutional: She is oriented to person, place, and time. She appears well-developed and well-nourished. No distress.  Musculoskeletal:  Left wrist: no swelling, erythema, or deformity.  Tender over flexor radialis tendons.  Full range of motion but pain with ulnar deviation.  5-/5 strength in thumb abduction and grip.  Negative Tinel's.   Neurological: She is alert and oriented to person, place, and time.  Skin: Skin is warm and dry. No rash noted.    ED Course  Procedures (including critical care time) Labs Review Labs Reviewed - No data to display  Imaging Review No results found.   MDM   1. Tendonitis of wrist, left    We'll treat conservatively with ice and bracing. Diclofenac 75 mg twice a day for the next 2 weeks. Followup with PCP in 1-2 weeks for recheck.    Melony Overly, MD 05/06/14 1250

## 2014-05-06 NOTE — ED Notes (Signed)
Pt has no medical complaints. PT is requesting lab work so she can give plasma.

## 2014-05-06 NOTE — ED Notes (Signed)
Declined W/C at D/C and was escorted to lobby by RN. 

## 2014-05-09 ENCOUNTER — Encounter (HOSPITAL_COMMUNITY): Payer: Self-pay | Admitting: Emergency Medicine

## 2014-05-09 ENCOUNTER — Emergency Department (HOSPITAL_COMMUNITY)
Admission: EM | Admit: 2014-05-09 | Discharge: 2014-05-09 | Disposition: A | Discharge status: Home / Self Care | Payer: BC Managed Care – PPO | Attending: Emergency Medicine | Admitting: Emergency Medicine

## 2014-05-09 DIAGNOSIS — Z8709 Personal history of other diseases of the respiratory system: Secondary | ICD-10-CM | POA: Diagnosis not present

## 2014-05-09 DIAGNOSIS — Z87891 Personal history of nicotine dependence: Secondary | ICD-10-CM | POA: Diagnosis not present

## 2014-05-09 DIAGNOSIS — Z0000 Encounter for general adult medical examination without abnormal findings: Secondary | ICD-10-CM | POA: Insufficient documentation

## 2014-05-09 NOTE — ED Notes (Signed)
The pt was here Sunday to get her iron checked now she needs a paper filled out from the plasma center

## 2014-05-09 NOTE — ED Provider Notes (Signed)
CSN: [id number]     Arrival date & time 05/09/14  1658 History   First MD Initiated Contact with Patient 05/09/14 1723   This chart was scribed for non-physician practitioner Phineas Inches, [place], working with Dot Lanes, MD by Rosary Lively, ED scribe. This patient was seen in room TR04C/TR04C and the patient's care was started at 5:44 PM.     Chief Complaint  Patient presents with  . needs a paper filled out    The history is provided by the patient. No language interpreter was used.   HPI Comments:  Elizabeth Diaz is a 48 y.o. female who presents to the Emergency Department with paperwork to be approved for plasma donation. Pt states that she was unable to donate because her hematocrit levels were low on her visit to ED on 05/06/2014. Pt states that she does have PCP.   Past Medical History  Diagnosis Date  . Pneumothorax    Past Surgical History  Procedure Laterality Date  . Chest tube insertion    . Tubal ligation     Family History  Problem Relation Age of Onset  . Heart failure Father   . Diabetes Maternal Grandmother   . Diabetes Paternal Grandfather   . Diabetes Father    History  Substance Use Topics  . Smoking status: Former Smoker -- 1.50 packs/day for 20 years    Types: Cigarettes    Quit date: 05/04/2009  . Smokeless tobacco: Not on file  . Alcohol Use: Yes     Comment: drinks liquor every night. 2-3 glasses    OB History   Grav Para Term Preterm Abortions TAB SAB Ect Mult Living                 Review of Systems  Constitutional: Negative for fever, chills and fatigue.  HENT: Negative for trouble swallowing.   Eyes: Negative for visual disturbance.  Respiratory: Negative for shortness of breath.   Cardiovascular: Negative for chest pain and palpitations.  Gastrointestinal: Negative for nausea, vomiting, abdominal pain and diarrhea.  Genitourinary: Negative for dysuria and difficulty urinating.  Musculoskeletal: Negative for arthralgias and  neck pain.  Skin: Negative for color change.  Neurological: Negative for dizziness and weakness.  Psychiatric/Behavioral: Negative for dysphoric mood.  All other systems reviewed and are negative.     Allergies  Review of patient's allergies indicates no known allergies.  Home Medications   Prior to Admission medications   Not on File   BP 144/64  Pulse 85  Temp(Src) 98.6 F (37 C) (Oral)  Resp 18  SpO2 99%  LMP 05/04/2014 Physical Exam  Nursing note and vitals reviewed. Constitutional: She is oriented to person, place, and time. She appears well-developed and well-nourished. No distress.  HENT:  Head: Normocephalic and atraumatic.  Eyes: Conjunctivae and EOM are normal.  Neck: Normal range of motion. Neck supple.  Cardiovascular: Normal rate and regular rhythm.  Exam reveals no gallop and no friction rub.   No murmur heard. Pulmonary/Chest: Effort normal and breath sounds normal. She has no wheezes. She has no rales. She exhibits no tenderness.  Abdominal: Soft. There is no tenderness.  Musculoskeletal: Normal range of motion.  Neurological: She is alert and oriented to person, place, and time.  Speech is goal-oriented. Moves limbs without ataxia.   Skin: Skin is warm and dry.  Psychiatric: She has a normal mood and affect. Her behavior is normal.    ED Course  Procedures  DIAGNOSTIC STUDIES: Oxygen  Saturation is 99% on RA, normal by my interpretation.  COORDINATION OF CARE: 5:47 PM-Discussed treatment plan with pt at bedside and pt agreed to plan.   EKG Interpretation None      MDM   Final diagnoses:  Well adult health check   Patient requesting me to sign a release form for clearance to donate plasma. I am not familiar with the patient and do not know the requirements for safely donating plasma so I will not sign a release for her. Vitals stable and patient afebrile. Patient will follow up with PCP.   I personally performed the services described in  this documentation, which was scribed in my presence. The recorded information has been reviewed and is accurate.      Alvina Chou, [place] 05/09/14 1959

## 2014-05-09 NOTE — ED Notes (Signed)
Pt had low iron in past and unable to donate plasma. Pt reports her iron is normal now and needs a paper filled out stating it is okay for her to donate plasma again.

## 2014-05-10 NOTE — ED Provider Notes (Signed)
Medical screening examination/treatment/procedure(s) were performed by non-physician practitioner and as supervising physician I was immediately available for consultation/collaboration.   EKG Interpretation None       Virgel Manifold, MD 05/10/14 [id number]

## 2014-05-14 ENCOUNTER — Encounter: Payer: Self-pay | Admitting: Family Medicine

## 2014-05-14 ENCOUNTER — Ambulatory Visit (INDEPENDENT_AMBULATORY_CARE_PROVIDER_SITE_OTHER): Payer: BC Managed Care – PPO | Admitting: Family Medicine

## 2014-05-14 VITALS — BP 126/77 | HR 79 | Temp 98.6°F | Wt 260.4 lb

## 2014-05-14 DIAGNOSIS — G8929 Other chronic pain: Secondary | ICD-10-CM | POA: Insufficient documentation

## 2014-05-14 DIAGNOSIS — M549 Dorsalgia, unspecified: Secondary | ICD-10-CM

## 2014-05-14 DIAGNOSIS — M546 Pain in thoracic spine: Secondary | ICD-10-CM

## 2014-05-14 MED ORDER — CYCLOBENZAPRINE HCL 10 MG PO TABS: 5.0000 mg | ORAL_TABLET | Freq: Three times a day (TID) | ORAL | Status: DC | PRN

## 2014-05-14 NOTE — ED Provider Notes (Signed)
Medical screening examination/treatment/procedure(s) were performed by non-physician practitioner and as supervising physician I was immediately available for consultation/collaboration.    Dot Lanes, MD 05/14/14 [id number]

## 2014-05-14 NOTE — Assessment & Plan Note (Signed)
Consistent with Right mid-back muscle strain (Lat vs intercostal) - Likely due to lifting while at work, localized tenderness without radiating symptoms  Plan: 1. Rx Flexeril 10mg  (take half to whole tab) TID PRN 2. Continue Tylenol / Advil alternate, conservative therapy with heating pad / moist heat, relative rest, stretches 3. Wrote note out of work, resume 8/19 4. RTC if worsening or no improvement 2-4 weeks

## 2014-05-14 NOTE — Patient Instructions (Signed)
Dear Elizabeth Diaz, Thank you for coming in to clinic today.  Today we discussed your Back Pain 1. I think that this is a muscle strain. 2. Sent prescription for Flexeril take half to 1 whole tab up to 3 times a day as needed for pain. 3. Take Advil/Ibuprofen 200-400mg  every 6 hours as needed, and you may also alternate with Tylenol for pain relief 4. Use Heating pad or Moist Heat to help ease your pain. Work on daily stretches as discussed to stay active / loose  Please schedule a follow-up appointment with Dr. Raeford Razor for follow-up in 2-4 weeks if no improvement.  If you have any other questions or concerns, please feel free to call the clinic to contact me. You may also schedule an earlier appointment if necessary.  However, if your symptoms get significantly worse, please go to the Emergency Department to seek immediate medical attention.  Elizabeth Putnam, DO [facility]   Muscle Strain A muscle strain is an injury that occurs when a muscle is stretched beyond its normal length. Usually a small number of muscle fibers are torn when this happens. Muscle strain is rated in degrees. First-degree strains have the least amount of muscle fiber tearing and pain. Second-degree and third-degree strains have increasingly more tearing and pain.  Usually, recovery from muscle strain takes 1-2 weeks. Complete healing takes 5-6 weeks.  CAUSES  Muscle strain happens when a sudden, violent force placed on a muscle stretches it too far. This may occur with lifting, sports, or a fall.  RISK FACTORS Muscle strain is especially common in athletes.  SIGNS AND SYMPTOMS At the site of the muscle strain, there may be:  Pain.  Bruising.  Swelling.  Difficulty using the muscle due to pain or lack of normal function. DIAGNOSIS  Your health care provider will perform a physical exam and ask about your medical history. TREATMENT  Often, the best treatment for a muscle strain is  resting, icing, and applying cold compresses to the injured area.  HOME CARE INSTRUCTIONS   Use the PRICE method of treatment to promote muscle healing during the first 2-3 days after your injury. The PRICE method involves:  Protecting the muscle from being injured again.  Restricting your activity and resting the injured body part.  Icing your injury. To do this, put ice in a plastic bag. Place a towel between your skin and the bag. Then, apply the ice and leave it on from 15-20 minutes each hour. After the third day, switch to moist heat packs.  Apply compression to the injured area with a splint or elastic bandage. Be careful not to wrap it too tightly. This may interfere with blood circulation or increase swelling.  Elevate the injured body part above the level of your heart as often as you can.  Only take over-the-counter or prescription medicines for pain, discomfort, or fever as directed by your health care provider.  Warming up prior to exercise helps to prevent future muscle strains. SEEK MEDICAL CARE IF:   You have increasing pain or swelling in the injured area.  You have numbness, tingling, or a significant loss of strength in the injured area. MAKE SURE YOU:   Understand these instructions.  Will watch your condition.  Will get help right away if you are not doing well or get worse. Document Released: 09/14/2005 Document Revised: 07/05/2013 Document Reviewed: 04/13/2013 [facility] Patient Information 2015 [place], [place]. This information is not intended to replace advice given to you  by your health care provider. Make sure you discuss any questions you have with your health care provider.

## 2014-05-14 NOTE — Progress Notes (Signed)
Subjective:     Patient ID: Elizabeth Diaz, female   DOB: 06/16/1966, 48 y.o.   MRN: [id number]  Patient presents for same day appointment.  HPI  Back pain: - Hx chronic back pain, without prior hx of injury, accident, or surgery - Reported that she "thinks she pulled a back muscle" while at work on Friday 05/11/14, routinely lifts heavy boxes / cases at work (> 20 lbs). Denies any acute injury - States increased pain today, difficulty getting out of bed today, did not go to work today - Right lower back / side pain, sharp nagging 7/10 pain, worse with laying down, forward flexion, without radiation to legs - Tried prior rx of Diclofenac (discontinued due to side-effects). Currently not taking Advil or Tylenol - Denies any fevers/chills, new numbness, weakness, or tingling, urinary retention or bowel/bladder incontinence  I have reviewed and updated the following as appropriate: allergies and current medications  Social Hx: - Works at [employer]  Review of Systems  See above HPI     Objective:   Physical Exam  BP 126/77  Pulse 79  Temp(Src) 98.6 F (37 C) (Oral)  Wt 260 lb 6.4 oz (118.117 kg)  LMP 05/04/2014  Gen - obese, well-appearing, NAD Heart - RRR, no murmurs heard MSK - Back: spinous processes non-tender to palpation. Right lower thoracic region - tender paraspinal muscles across to flank with some hypertonicity appreciated vs Left on exam. Seated SLR / Slump test without radicular pain. Ext - non-tender, no edema, peripheral pulses intact +2 b/l Skin - warm, dry, no rashes Neuro - awake, alert, oriented, grossly non-focal, intact muscle strength 5/5 b/l, intact distal sensation to light touch, gait normal     Assessment:     See specific A&P problem list for details.      Plan:     See specific A&P problem list for details.

## 2014-05-21 ENCOUNTER — Ambulatory Visit: Payer: BC Managed Care – PPO | Admitting: Family Medicine

## 2014-06-08 ENCOUNTER — Encounter: Payer: Self-pay | Admitting: Family Medicine

## 2014-06-19 ENCOUNTER — Telehealth: Payer: Self-pay | Admitting: Family Medicine

## 2014-06-19 NOTE — Telephone Encounter (Signed)
Pt called and wanted the doctor to know that an online provider would be contacting him concerning back brace and braces for both knee's. She would like the doctor to approve these so that they can send them to her home. jw

## 2014-07-23 ENCOUNTER — Encounter: Payer: Self-pay | Admitting: Family Medicine

## 2014-07-23 ENCOUNTER — Ambulatory Visit (INDEPENDENT_AMBULATORY_CARE_PROVIDER_SITE_OTHER): Payer: BC Managed Care – PPO | Admitting: Family Medicine

## 2014-07-23 VITALS — BP 132/74 | HR 77 | Temp 98.4°F | Wt 259.0 lb

## 2014-07-23 DIAGNOSIS — R202 Paresthesia of skin: Secondary | ICD-10-CM | POA: Diagnosis not present

## 2014-07-23 DIAGNOSIS — R159 Full incontinence of feces: Secondary | ICD-10-CM | POA: Diagnosis not present

## 2014-07-23 DIAGNOSIS — Z52008 Unspecified donor, other blood: Secondary | ICD-10-CM

## 2014-07-23 DIAGNOSIS — R200 Anesthesia of skin: Secondary | ICD-10-CM

## 2014-07-23 LAB — POCT GLYCOSYLATED HEMOGLOBIN (HGB A1C): Hemoglobin A1C: 6.3

## 2014-07-23 MED ORDER — GABAPENTIN 100 MG PO CAPS: 100.0000 mg | ORAL_CAPSULE | Freq: Three times a day (TID) | ORAL | Status: DC

## 2014-07-23 NOTE — Patient Instructions (Signed)
It was nice to meet you today!  For your leg numbness: -checking labs and MRI -I will call you if your test results are not normal.  Otherwise, I will send you a letter.  If you do not hear from me with in 2 weeks please call our office.     -take gabapentin 100mg  three times daily for the numbness. It may make you sleepy so use caution.  For your stool problems: -call [facility] to set up an appointment. Their number is [phone]  Follow up with Dr. Raeford Razor in 3 weeks for these problems.  Be well, Dr. Ardelia Mems

## 2014-07-23 NOTE — Progress Notes (Signed)
Patient ID: Elizabeth Diaz, female   DOB: Jul 13, 1966, 48 y.o.   MRN: [id number]  HPI:  Pt presents for a same day appointment to discuss left leg numbness/tingling.  Has gone on for months but worse over the last several weeks. Sometimes loses balance but hasn't fallen. Primarily anterior & posterior aspect of left thigh. No hx of diabetes. Feels tired a lot. Has hx of back pain in mid to low back for years. Never had MRI of back. No fevers. Endorses urinary and fecal incontinence for a few years, getting worse. Was referred to to GI at her recent new patient visit but has not yet gotten appointment scheduled. No hx of this numbness before starting a few months ago. Does not wear tight belt.  No hx of diabetes. Occasional blurry vision, uses reading glasses.  She also requests anemia testing today. Has donated plasma in the past and was refused one time due to what sounds like low Hct.   ROS: See HPI  [place]: only current med is flexeril  PHYSICAL EXAM: BP 132/74  Pulse 77  Temp(Src) 98.4 F (36.9 C) (Oral)  Wt 259 lb (117.482 kg) Gen: NAD HEENT: NCAT Heart: RRR no murmurs Lungs: CTAB, NWOB Abdomen: soft nontender Neuro: speech normal. Face symmetric. Full strenght bilat lower ext. No hyperreflexia either side. Sensation intact to light touch bilat but possibly decreased L compared to R. Also slightly decr sensation to cold on L side. No clonus. Straight leg raise neg bilat. Back: no significant low back TTP  ASSESSMENT/PLAN:  1. Left sided leg numbness/tingling - concerning in setting of low back pain with both fecal and urinary incontinence. Not acute in onset, no weakness appreciable on exam today so no need for emergent imaging at this time. Decreased rectal tone documented at recent office visit.  - Will obtain lumbar spine MRI to rule out cord compression/other causes of pt's symptoms.  - Also check vit B12, BMET and A1c to rule out metabolic causes. - Start gabapentin 100mg   TID for symptomatic relief - f/u with PCP in 3 weeks to review results & decide on further plan - Precepted with Dr. Ree Kida who agrees with this plan.   2. Request for anemia testing - check B12, CBC, iron panel today.   3. Fecal & urinary incontinence - previously referred to GI, has not yet scheduled. Will give phone # for [facility] so pt can call and expedite referral process.  FOLLOW UP: F/u in 3 weeks for above problems with PCP.  [place]. Ardelia Mems, [place]

## 2014-07-24 ENCOUNTER — Encounter: Payer: Self-pay | Admitting: Internal Medicine

## 2014-07-24 LAB — CBC
HCT: 35.1 % — ABNORMAL LOW (ref 36.0–46.0)
Hemoglobin: 12.3 g/dL (ref 12.0–15.0)
MCH: 30.3 pg (ref 26.0–34.0)
MCHC: 35 g/dL (ref 30.0–36.0)
MCV: 86.5 fL (ref 78.0–100.0)
PLATELETS: 347 10*3/uL (ref 150–400)
RBC: 4.06 MIL/uL (ref 3.87–5.11)
RDW: 13.9 % (ref 11.5–15.5)
WBC: 9.2 10*3/uL (ref 4.0–10.5)

## 2014-07-24 LAB — IBC PANEL
%SAT: 16 % — AB (ref 20–55)
TIBC: 351 ug/dL (ref 250–470)
UIBC: 294 ug/dL (ref 125–400)

## 2014-07-24 LAB — IRON: Iron: 57 ug/dL (ref 42–145)

## 2014-07-24 LAB — BASIC METABOLIC PANEL
BUN: 10 mg/dL (ref 6–23)
CHLORIDE: 101 meq/L (ref 96–112)
CO2: 30 mEq/L (ref 19–32)
CREATININE: 0.63 mg/dL (ref 0.50–1.10)
Calcium: 8.9 mg/dL (ref 8.4–10.5)
Glucose, Bld: 93 mg/dL (ref 70–99)
Potassium: 4.4 mEq/L (ref 3.5–5.3)
Sodium: 139 mEq/L (ref 135–145)

## 2014-07-24 LAB — VITAMIN B12: Vitamin B-12: 475 pg/mL (ref 211–911)

## 2014-07-27 ENCOUNTER — Telehealth: Payer: Self-pay | Admitting: *Deleted

## 2014-07-27 NOTE — Telephone Encounter (Signed)
Called for pre-cert of MRI appt. Left voicemail.   Rosemarie Ax, MD PGY-2, [facility] 07/27/2014, 11:44 AM

## 2014-07-27 NOTE — Telephone Encounter (Signed)
Kylie from the Pre-service center called needing a pre-cert for MRI appt on Monday 07/30/2014.  Please give her a call when completed at [phone].  Derl Barrow, RN

## 2014-07-30 ENCOUNTER — Ambulatory Visit (HOSPITAL_COMMUNITY)
Admission: RE | Admit: 2014-07-30 | Discharge: 2014-07-30 | Disposition: A | Discharge status: Home / Self Care | Payer: BC Managed Care – PPO | Source: Ambulatory Visit | Attending: Diagnostic Radiology | Admitting: Diagnostic Radiology

## 2014-07-30 DIAGNOSIS — M5136 Other intervertebral disc degeneration, lumbar region: Secondary | ICD-10-CM | POA: Diagnosis not present

## 2014-07-30 DIAGNOSIS — M5124 Other intervertebral disc displacement, thoracic region: Secondary | ICD-10-CM | POA: Insufficient documentation

## 2014-07-30 DIAGNOSIS — M549 Dorsalgia, unspecified: Secondary | ICD-10-CM | POA: Diagnosis present

## 2014-07-30 DIAGNOSIS — M5134 Other intervertebral disc degeneration, thoracic region: Secondary | ICD-10-CM | POA: Insufficient documentation

## 2014-07-30 DIAGNOSIS — R200 Anesthesia of skin: Secondary | ICD-10-CM

## 2014-07-30 DIAGNOSIS — R202 Paresthesia of skin: Secondary | ICD-10-CM

## 2014-08-01 ENCOUNTER — Telehealth: Payer: Self-pay | Admitting: Family Medicine

## 2014-08-01 DIAGNOSIS — M546 Pain in thoracic spine: Secondary | ICD-10-CM

## 2014-08-01 NOTE — Telephone Encounter (Signed)
Pt called and would like to know what her test results and her xray results were. She would also like to have a refill on her flexeril. Elizabeth Diaz

## 2014-08-02 MED ORDER — CYCLOBENZAPRINE HCL 10 MG PO TABS: 5.0000 mg | ORAL_TABLET | Freq: Three times a day (TID) | ORAL | Status: DC | PRN

## 2014-08-02 NOTE — Telephone Encounter (Signed)
MRI showing no evidence of neural compression.  It did show facet degeneration at L4-5 and L5-S1 that could be a cause of lowback pain or referred facet syndrome pain.   Rosemarie Ax, MD PGY-2, [facility] 08/02/2014, 2:35 PM

## 2014-08-03 NOTE — Telephone Encounter (Signed)
LVM for patient to call back to inform of below MRI results

## 2014-08-06 NOTE — Telephone Encounter (Signed)
LVM for patient to call back to inform her of below  ----- Message from Rosemarie Ax, MD sent at 08/02/2014 2:37 PM EST -----    Please call patient and inform that MRI showed chronic changes indicating reasons for back pain but no nerve compression. Refilled Flexeril. Thanks.

## 2014-08-07 NOTE — Telephone Encounter (Signed)
Attempted to call patient but no answer once again. Closing phone note due to multiple attempts to reach patient, please give patient information below if she call back

## 2014-08-09 ENCOUNTER — Emergency Department (HOSPITAL_COMMUNITY): Payer: BC Managed Care – PPO

## 2014-08-09 ENCOUNTER — Emergency Department (HOSPITAL_COMMUNITY)
Admission: EM | Admit: 2014-08-09 | Discharge: 2014-08-09 | Disposition: A | Discharge status: Home / Self Care | Payer: BC Managed Care – PPO | Attending: Emergency Medicine | Admitting: Emergency Medicine

## 2014-08-09 ENCOUNTER — Encounter (HOSPITAL_COMMUNITY): Payer: Self-pay | Admitting: Emergency Medicine

## 2014-08-09 DIAGNOSIS — Z87891 Personal history of nicotine dependence: Secondary | ICD-10-CM | POA: Diagnosis not present

## 2014-08-09 DIAGNOSIS — W19XXXA Unspecified fall, initial encounter: Secondary | ICD-10-CM

## 2014-08-09 DIAGNOSIS — Y998 Other external cause status: Secondary | ICD-10-CM | POA: Insufficient documentation

## 2014-08-09 DIAGNOSIS — M549 Dorsalgia, unspecified: Secondary | ICD-10-CM

## 2014-08-09 DIAGNOSIS — Z8709 Personal history of other diseases of the respiratory system: Secondary | ICD-10-CM | POA: Insufficient documentation

## 2014-08-09 DIAGNOSIS — M25511 Pain in right shoulder: Secondary | ICD-10-CM

## 2014-08-09 DIAGNOSIS — W01198A Fall on same level from slipping, tripping and stumbling with subsequent striking against other object, initial encounter: Secondary | ICD-10-CM | POA: Diagnosis not present

## 2014-08-09 DIAGNOSIS — M779 Enthesopathy, unspecified: Secondary | ICD-10-CM

## 2014-08-09 DIAGNOSIS — S299XXA Unspecified injury of thorax, initial encounter: Secondary | ICD-10-CM | POA: Insufficient documentation

## 2014-08-09 DIAGNOSIS — S4991XA Unspecified injury of right shoulder and upper arm, initial encounter: Secondary | ICD-10-CM | POA: Insufficient documentation

## 2014-08-09 DIAGNOSIS — Y929 Unspecified place or not applicable: Secondary | ICD-10-CM | POA: Diagnosis not present

## 2014-08-09 DIAGNOSIS — Y9301 Activity, walking, marching and hiking: Secondary | ICD-10-CM | POA: Insufficient documentation

## 2014-08-09 MED ORDER — HYDROCODONE-ACETAMINOPHEN 5-325 MG PO TABS: 1.0000 | ORAL_TABLET | Freq: Four times a day (QID) | ORAL | Status: DC | PRN

## 2014-08-09 MED ORDER — HYDROCODONE-ACETAMINOPHEN 5-325 MG PO TABS
1.0000 | ORAL_TABLET | Freq: Once | ORAL | Status: AC
  Administered 2014-08-09: 1 via ORAL
  Filled 2014-08-09: qty 1

## 2014-08-09 MED ORDER — NAPROXEN 500 MG PO TABS: 500.0000 mg | ORAL_TABLET | Freq: Two times a day (BID) | ORAL | Status: DC | PRN

## 2014-08-09 NOTE — ED Provider Notes (Signed)
CSN: [id number]     Arrival date & time 08/09/14  1624 History  This chart was scribed for non-physician practitioner, Zacarias Pontes, PA-C working with Virgel Manifold, MD, by Erling Conte, ED Scribe. This patient was seen in room TR07C/TR07C and the patient's care was started at 4:40 PM.    Chief Complaint  Patient presents with  . Fall  . Back Pain    Patient is a 48 y.o. female presenting with fall, back pain, and shoulder pain. The history is provided by the patient. No language interpreter was used.  Fall This is a new problem. The current episode started 2 days ago. The problem occurs rarely. The problem has not changed since onset.Pertinent negatives include no chest pain, no abdominal pain, no headaches and no shortness of breath.  Back Pain Location:  Thoracic spine Quality: sharp. Radiates to:  Does not radiate Pain severity:  Moderate ("8/10") Pain is:  Same all the time Onset quality:  Sudden Duration:  2 days Timing:  Constant Progression:  Unchanged Chronicity:  Chronic Context: falling   Relieved by:  Heating pad Worsened by:  Movement, lying down and ambulation Ineffective treatments:  NSAIDs Associated symptoms: no abdominal pain, no bladder incontinence, no bowel incontinence, no chest pain, no dysuria, no fever, no headaches, no leg pain, no numbness, no perianal numbness, no tingling and no weakness   Risk factors: obesity   Risk factors: no hx of cancer, no hx of osteoporosis, no lack of exercise, no menopause, not pregnant, no recent surgery, no steroid use and no vascular disease   Shoulder Pain Location:  Shoulder Time since incident:  2 days Injury: yes   Mechanism of injury: fall   Fall:    Fall occurred:  Tripped   Height of fall:  Standing   Impact surface:  [employer] of impact: right shoulder.   Entrapped after fall: no   Shoulder location:  R shoulder Pain details:    Quality:  Aching   Radiates to:  Does not radiate    Severity:  Moderate   Onset quality:  Sudden   Duration:  2 days   Timing:  Constant   Progression:  Unchanged Chronicity:  New Dislocation: no   Foreign body present:  No foreign bodies Associated symptoms: back pain and decreased range of motion   Associated symptoms: no fever, no muscle weakness, no neck pain, no numbness, no stiffness, no swelling and no tingling     HPI Comments: BRITNE BORELLI is a 48 y.o. female who presents to the Emergency Department due to a fall that occurred yesterday. Pt states that ever since she has been having constant, 8/10, "aching", right shoulder pain and "sharp", back pain localized in the middle of her upper back. She states she was walking and tripped over an object on the group and she fell forward and landed on her right shoulder. Denies head inj or LOC. The pain is exacerbated by pressure and movement. Has applied heating pad to her back with mild relief. She also tried Ibuprofen with no relief. She has h/o chronic back pain and tendinitis of her right shoulder. She feels the pain in her shoulder is the same as her tendinitis pain. The pain from her back does not radiate. Dr. Clearance Coots follows her for her chronic back pain and tendinitis of her left shoulder. She has an appt with him tomorrow. She had an MRI of her lumbar spine 3 days ago, prior to the fall.  Pt is still ambulatory without issue. Pt denies any LOC from the fall. She denies any new urinary incontinence, bowel incontinence, cauda equina symptoms, fever, CP, shortness of breath, abd pain, nausea, vomiting, neck pain, swelling, erythema, numbness, tingling, or weakness.   Past Medical History  Diagnosis Date  . Pneumothorax    Past Surgical History  Procedure Laterality Date  . Chest tube insertion    . Tubal ligation     Family History  Problem Relation Age of Onset  . Heart failure Father   . Diabetes Maternal Grandmother   . Diabetes Paternal Grandfather   . Diabetes Father     History  Substance Use Topics  . Smoking status: Former Smoker -- 1.50 packs/day for 20 years    Types: Cigarettes    Quit date: 05/04/2009  . Smokeless tobacco: Not on file  . Alcohol Use: Yes     Comment: drinks liquor every night. 2-3 glasses    OB History    No data available     Review of Systems  Constitutional: Negative for fever and chills.  Respiratory: Negative for shortness of breath.   Cardiovascular: Negative for chest pain.  Gastrointestinal: Negative for nausea, vomiting, abdominal pain and bowel incontinence.       No bowel incontinence  Genitourinary: Negative for bladder incontinence, dysuria and flank pain.       No urinary incontinence  Musculoskeletal: Positive for back pain and arthralgias (right shoulder). Negative for myalgias, joint swelling, gait problem, stiffness, neck pain and neck stiffness.  Skin: Negative for color change and wound.  Neurological: Negative for tingling, syncope, weakness, numbness and headaches.  10 Systems reviewed and all are negative for acute change except as noted in the HPI.      Allergies  Review of patient's allergies indicates no known allergies.  Home Medications   Prior to Admission medications   Medication Sig Start Date End Date Taking? Authorizing Provider  cyclobenzaprine (FLEXERIL) 10 MG tablet Take 0.5-1 tablets (5-10 mg total) by mouth 3 (three) times daily as needed for muscle spasms. 08/02/14   Rosemarie Ax, MD  gabapentin (NEURONTIN) 100 MG capsule Take 1 capsule (100 mg total) by mouth 3 (three) times daily. 07/23/14   Leeanne Rio, MD   Triage Vitals: BP 165/71 mmHg  Pulse 98  Temp(Src) 98.4 F (36.9 C) (Oral)  Resp 18  SpO2 98%  Physical Exam  Constitutional: She is oriented to person, place, and time. Vital signs are normal. She appears well-developed and well-nourished.  Non-toxic appearance. No distress.  HENT:  Head: Normocephalic and atraumatic.  Eyes: Conjunctivae and EOM  are normal. Right eye exhibits no discharge. Left eye exhibits no discharge.  Neck: Normal range of motion. Neck supple. No spinous process tenderness and no muscular tenderness present. No rigidity. No edema, no erythema and normal range of motion present.  FROM intact without spinous process or paraspinous muscle TTP, no bony stepoffs or deformities, no muscle spasms. No rigidity or meningeal signs. No bruising or swelling.  Cardiovascular: Normal rate and intact distal pulses.   Intact distal pulses  Pulmonary/Chest: Effort normal. No respiratory distress.  Abdominal: Normal appearance. She exhibits no distension.  Musculoskeletal:       Right shoulder: She exhibits decreased range of motion (due to pain) and tenderness. She exhibits no bony tenderness, no swelling, no effusion, no crepitus, no deformity, normal pulse and normal strength.       Thoracic back: She exhibits tenderness. She exhibits normal  range of motion, no swelling, no deformity and no spasm.  Cspine as above Thoracic spine with FROM intact with mild midline spinous process and R sided paraspinous muscle TTP, no bony stepoffs or deformities, no muscle spasms. No bruising or swelling. R shoulder with bicipital tendon groove TTP, AROM limited due to pain but PROM full in all directions. Neg apley scratch, neg int/ext rotation vs resistance, neg empty can test. No swelling or crepitus, no deformity. Neg cross arm test. No AC joint TTP Gait WNL. Strength and sensation grossly intact in all extremities.  Neurological: She is alert and oriented to person, place, and time. She has normal strength. No sensory deficit. Gait normal.  Skin: Skin is warm, dry and intact. No bruising and no rash noted. No erythema.  No abrasions No erythema or warmth No bruising  Psychiatric: She has a normal mood and affect. Her behavior is normal.  Nursing note and vitals reviewed.   ED Course  Procedures (including critical care time)  DIAGNOSTIC  STUDIES: Oxygen Saturation is 98% on RA, normal by my interpretation.    COORDINATION OF CARE:    Labs Review Labs Reviewed - No data to display  Imaging Review Dg Thoracic Spine W/swimmers  08/09/2014   CLINICAL DATA:  Fall yesterday with resulting right-sided back pain.  EXAM: THORACIC SPINE - 2 VIEW + SWIMMERS  COMPARISON:  Chest x-ray 04/11/2008  FINDINGS: Vertebral body alignment, heights and disc spaces are within normal. There is mild spondylosis throughout the thoracic spine. There is no compression fracture or subluxation pedicles are intact.  IMPRESSION: No acute findings.  Mild spondylosis throughout the thoracic spine.   Electronically Signed   By: Marin Olp M.D.   On: 08/09/2014 18:30     EKG Interpretation None      MDM   Final diagnoses:  Back pain  Fall  Right shoulder pain  Tendinitis    48y/o female with fall and subsequent acute on chronic R shoulder pain as well as thoracic midline and R sided paraspinous muscle TTP. No focal bony TTP in shoulder, no signs of rotator cuff injury, pain located in bicipital tendon groove c/w aggravation of tendinitis. Extremities neurovascularly intact with soft compartments. Doubt need for imaging of this today. Due to midline back pain, will obtain xray imaging. No red flag s/sx for cord compression, doubt need for emergent MRI. Will give vicodin here for pain. Will reassess after imaging.  6:33 PM Xrays neg for acute change. Pain improved with vicodin. Will place sling for arm pain, discussed RICE and naprosyn/vicodin for pain and f/up at her appt tomorrow with her PCP. Discussed gentle ROM exercises. I explained the diagnosis and have given explicit precautions to return to the ER including for any other new or worsening symptoms. The patient understands and accepts the medical plan as it's been dictated and I have answered their questions. Discharge instructions concerning home care and prescriptions have been given. The  patient is STABLE and is discharged to home in good condition.   I personally performed the services described in this documentation, which was scribed in my presence. The recorded information has been reviewed and is accurate.  BP 165/71 mmHg  Pulse 98  Temp(Src) 98.4 F (36.9 C) (Oral)  Resp 18  SpO2 98%  LMP 07/15/2014  Meds ordered this encounter  Medications  . HYDROcodone-acetaminophen (NORCO/VICODIN) 5-325 MG per tablet 1 tablet    Sig:   . HYDROcodone-acetaminophen (NORCO) 5-325 MG per tablet    Sig:  Take 1-2 tablets by mouth every 6 (six) hours as needed for severe pain.    Dispense:  6 tablet    Refill:  0    Order Specific Question:  Supervising Provider    Answer:  Noemi Chapel D [[id number]]  . naproxen (NAPROSYN) 500 MG tablet    Sig: Take 1 tablet (500 mg total) by mouth 2 (two) times daily as needed for mild pain, moderate pain or headache (TAKE WITH MEALS.).    Dispense:  20 tablet    Refill:  0    Order Specific Question:  Supervising Provider    Answer:  Johnna Acosta [address] [place], PA-C 08/09/14 1836  Virgel Manifold, MD 08/16/14 1013

## 2014-08-09 NOTE — Discharge Instructions (Signed)
Wear shoulder sling for no more than 3 days, then begin performing gentle range of motion exercises. Ice your shoulder throughout the day, using an ice pack for 20 minutes at a time every hour. Alternate between naprosyn and vicodin for pain relief. Do not drive or operate machinery with pain medication use. See your regular doctor at your appointment tomorrow for ongoing care. Return to the ER for changes or worsening symptoms.   Musculoskeletal Pain Musculoskeletal pain is muscle and boney aches and pains. These pains can occur in any part of the body. Your caregiver may treat you without knowing the cause of the pain. They may treat you if blood or urine tests, X-rays, and other tests were normal.  CAUSES There is often not a definite cause or reason for these pains. These pains may be caused by a type of germ (virus). The discomfort may also come from overuse. Overuse includes working out too hard when your body is not fit. Boney aches also come from weather changes. Bone is sensitive to atmospheric pressure changes. HOME CARE INSTRUCTIONS   Ask when your test results will be ready. Make sure you get your test results.  Only take over-the-counter or prescription medicines for pain, discomfort, or fever as directed by your caregiver. If you were given medications for your condition, do not drive, operate machinery or power tools, or sign legal documents for 24 hours. Do not drink alcohol. Do not take sleeping pills or other medications that may interfere with treatment.  Continue all activities unless the activities cause more pain. When the pain lessens, slowly resume normal activities. Gradually increase the intensity and duration of the activities or exercise.  During periods of severe pain, bed rest may be helpful. Lay or sit in any position that is comfortable.  Putting ice on the injured area.  Put ice in a bag.  Place a towel between your skin and the bag.  Leave the ice on for 15 to  20 minutes, 3 to 4 times a day.  Follow up with your caregiver for continued problems and no reason can be found for the pain. If the pain becomes worse or does not go away, it may be necessary to repeat tests or do additional testing. Your caregiver may need to look further for a possible cause. SEEK IMMEDIATE MEDICAL CARE IF:  You have pain that is getting worse and is not relieved by medications.  You develop chest pain that is associated with shortness or breath, sweating, feeling sick to your stomach (nauseous), or throw up (vomit).  Your pain becomes localized to the abdomen.  You develop any new symptoms that seem different or that concern you. MAKE SURE YOU:   Understand these instructions.  Will watch your condition.  Will get help right away if you are not doing well or get worse. Document Released: 09/14/2005 Document Revised: 12/07/2011 Document Reviewed: 05/19/2013 [facility] Patient Information 2015 [place], [place]. This information is not intended to replace advice given to you by your health care provider. Make sure you discuss any questions you have with your health care provider.  Cryotherapy Cryotherapy means treatment with cold. Ice or gel packs can be used to reduce both pain and swelling. Ice is the most helpful within the first 24 to 48 hours after an injury or flare-up from overusing a muscle or joint. Sprains, strains, spasms, burning pain, shooting pain, and aches can all be eased with ice. Ice can also be used when recovering from surgery. Ice  is effective, has very few side effects, and is safe for most people to use. PRECAUTIONS  Ice is not a safe treatment option for people with:  Raynaud phenomenon. This is a condition affecting small blood vessels in the extremities. Exposure to cold may cause your problems to return.  Cold hypersensitivity. There are many forms of cold hypersensitivity, including:  Cold urticaria. Red, itchy hives appear on the skin when  the tissues begin to warm after being iced.  Cold erythema. This is a red, itchy rash caused by exposure to cold.  Cold hemoglobinuria. Red blood cells break down when the tissues begin to warm after being iced. The hemoglobin that carry oxygen are passed into the urine because they cannot combine with blood proteins fast enough.  Numbness or altered sensitivity in the area being iced. If you have any of the following conditions, do not use ice until you have discussed cryotherapy with your caregiver:  Heart conditions, such as arrhythmia, angina, or chronic heart disease.  High blood pressure.  Healing wounds or open skin in the area being iced.  Current infections.  Rheumatoid arthritis.  Poor circulation.  Diabetes. Ice slows the blood flow in the region it is applied. This is beneficial when trying to stop inflamed tissues from spreading irritating chemicals to surrounding tissues. However, if you expose your skin to cold temperatures for too long or without the proper protection, you can damage your skin or nerves. Watch for signs of skin damage due to cold. HOME CARE INSTRUCTIONS Follow these tips to use ice and cold packs safely.  Place a dry or damp towel between the ice and skin. A damp towel will cool the skin more quickly, so you may need to shorten the time that the ice is used.  For a more rapid response, add gentle compression to the ice.  Ice for no more than 10 to 20 minutes at a time. The bonier the area you are icing, the less time it will take to get the benefits of ice.  Check your skin after 5 minutes to make sure there are no signs of a poor response to cold or skin damage.  Rest 20 minutes or more between uses.  Once your skin is numb, you can end your treatment. You can test numbness by very lightly touching your skin. The touch should be so light that you do not see the skin dimple from the pressure of your fingertip. When using ice, most people will feel  these normal sensations in this order: cold, burning, aching, and numbness.  Do not use ice on someone who cannot communicate their responses to pain, such as small children or people with dementia. HOW TO MAKE AN ICE PACK Ice packs are the most common way to use ice therapy. Other methods include ice massage, ice baths, and cryosprays. Muscle creams that cause a cold, tingly feeling do not offer the same benefits that ice offers and should not be used as a substitute unless recommended by your caregiver. To make an ice pack, do one of the following:  Place crushed ice or a bag of frozen vegetables in a sealable plastic bag. Squeeze out the excess air. Place this bag inside another plastic bag. Slide the bag into a pillowcase or place a damp towel between your skin and the bag.  Mix 3 parts water with 1 part rubbing alcohol. Freeze the mixture in a sealable plastic bag. When you remove the mixture from the freezer, it will  be slushy. Squeeze out the excess air. Place this bag inside another plastic bag. Slide the bag into a pillowcase or place a damp towel between your skin and the bag. SEEK MEDICAL CARE IF:  You develop white spots on your skin. This may give the skin a blotchy (mottled) appearance.  Your skin turns blue or pale.  Your skin becomes waxy or hard.  Your swelling gets worse. MAKE SURE YOU:   Understand these instructions.  Will watch your condition.  Will get help right away if you are not doing well or get worse. Document Released: 05/11/2011 Document Revised: 01/29/2014 Document Reviewed: 05/11/2011 [facility] Patient Information 2015 [place], [place]. This information is not intended to replace advice given to you by your health care provider. Make sure you discuss any questions you have with your health care provider.  Shoulder Range of Motion Exercises The shoulder is the most flexible joint in the human body. Because of this it is also the most unstable joint in the  body. All ages can develop shoulder problems. Early treatment of problems is necessary for a good outcome. People react to shoulder pain by decreasing the movement of the joint. After a brief period of time, the shoulder can become "frozen". This is an almost complete loss of the ability to move the damaged shoulder. Following injuries your caregivers can give you instructions on exercises to keep your range of motion (ability to move your shoulder freely), or regain it if it has been lost.  [place]: Codman's Exercise or Pendulum Exercise  This exercise may be performed in a prone (face-down) lying position or standing while leaning on a chair with the opposite arm. Its purpose is to relax the muscles in your shoulder and slowly but surely increase the range of motion and to relieve pain.  Lie on your stomach close to the side edge of the bed. Let your weak arm hang over the edge of the bed. Relax your shoulder, arm and hand. Let your shoulder blade relax and drop down.  Slowly and gently swing your arm forward and back. Do not use your neck muscles; relax them. It might be easier to have someone else gently start swinging your arm.  As pain decreases, increase your swing. To start, arm swing should begin at 15 degree angles. In time and as pain lessens, move to 30-45 degree angles. Start with swinging for about 15 seconds, and work towards swinging for 3 to 5 minutes.  This exercise may also be performed in a standing/bent over position.  Stand and hold onto a sturdy chair with your good arm. Bend forward at the waist and bend your knees slightly to help protect your back. Relax your weak arm, let it hang limp. Relax your shoulder blade and let it drop.  Keep your shoulder relaxed and use body motion to swing your arm in small circles.  Stand up tall and relax.  Repeat motion and change direction of circles.  Start with swinging for about  30 seconds, and work towards swinging for 3 to 5 minutes. STRETCHING EXERCISES:  Lift your arm out in front of you with the elbow bent at 90 degrees. Using your other arm gently pull the elbow forward and across your body.  Bend one arm behind you with the palm facing outward. Using the other arm, hold a towel or rope and reach this arm up above your head, then bend it at the elbow to  move your wrist to behind your neck. Grab the free end of the towel with the hand behind your back. Gently pull the towel up with the hand behind your neck, gradually increasing the pull on the hand behind the small of your back. Then, gradually pull down with the hand behind the small of your back. This will pull the hand and arm behind your neck further. Both shoulders will have an increased range of motion with repetition of this exercise. STRENGTHENING EXERCISES:  Standing with your arm at your side and straight out from your shoulder with the elbow bent at 90 degrees, hold onto a small weight and slowly raise your hand so it points straight up in the air. Repeat this five times to begin with, and gradually increase to ten times. Do this four times per day. As you grow stronger you can gradually increase the weight.  Repeat the above exercise, only this time using an elastic band. Start with your hand up in the air and pull down until your hand is by your side. As you grow stronger, gradually increase the amount you pull by increasing the number or size of the elastic bands. Use the same amount of repetitions.  Standing with your hand at your side and holding onto a weight, gradually lift the hand in front of you until it is over your head. Do the same also with the hand remaining at your side and lift the hand away from your body until it is again over your head. Repeat this five times to begin with, and gradually increase to ten times. Do this four times per day. As you grow stronger you can gradually increase the  weight. Document Released: 06/13/2003 Document Revised: 09/19/2013 Document Reviewed: 09/14/2005 [facility] Patient Information 2015 [place], [place]. This information is not intended to replace advice given to you by your health care provider. Make sure you discuss any questions you have with your health care provider.

## 2014-08-09 NOTE — ED Notes (Signed)
Pt comfortable with discharge and follow up instructions. Pt declines wheelchair, escorted to waiting area by this RN. Prescriptions x2. 

## 2014-08-09 NOTE — ED Notes (Signed)
Pt sts tripped and fell yesterday c/o right shoulder and back pain

## 2014-08-10 ENCOUNTER — Ambulatory Visit (INDEPENDENT_AMBULATORY_CARE_PROVIDER_SITE_OTHER): Payer: BC Managed Care – PPO | Admitting: Family Medicine

## 2014-08-10 ENCOUNTER — Encounter: Payer: Self-pay | Admitting: Family Medicine

## 2014-08-10 VITALS — BP 125/79 | HR 85 | Temp 98.2°F | Ht 61.0 in | Wt 263.0 lb

## 2014-08-10 DIAGNOSIS — R159 Full incontinence of feces: Secondary | ICD-10-CM

## 2014-08-10 DIAGNOSIS — M546 Pain in thoracic spine: Secondary | ICD-10-CM | POA: Diagnosis not present

## 2014-08-10 NOTE — Patient Instructions (Signed)
Thank you for coming in,   Please try to perform the exercises for your back. This is the best thing in order to get rid of the pain.   Physical therapy is always an option.   Tylenol is good for taking away the pain.   Please follow up with me as needed.    Please feel free to call with any questions or concerns at any time, at [phone]. --Dr. Raeford Razor

## 2014-08-10 NOTE — Progress Notes (Signed)
   Subjective:    Patient ID: Elizabeth Diaz, female    DOB: 1966-05-10, 48 y.o.   MRN: [id number]  HPI  Elizabeth Diaz is here for f/u for her MRI.   Recently had MRI completed for some bowel incontenance that has been occurring for several years. She denied any sensation changes and had good rectal tone on previous exam.   She does endorse some right sided lower back pain. It was worse after her fall recently. She endorses no loss of strength or sensation.    Current Outpatient Prescriptions on File Prior to Visit  Medication Sig Dispense Refill  . cyclobenzaprine (FLEXERIL) 10 MG tablet Take 0.5-1 tablets (5-10 mg total) by mouth 3 (three) times daily as needed for muscle spasms. 60 tablet 0  . gabapentin (NEURONTIN) 100 MG capsule Take 1 capsule (100 mg total) by mouth 3 (three) times daily. 90 capsule 0  . HYDROcodone-acetaminophen (NORCO) 5-325 MG per tablet Take 1-2 tablets by mouth every 6 (six) hours as needed for severe pain. 6 tablet 0  . naproxen (NAPROSYN) 500 MG tablet Take 1 tablet (500 mg total) by mouth 2 (two) times daily as needed for mild pain, moderate pain or headache (TAKE WITH MEALS.). 20 tablet 0   No current facility-administered medications on file prior to visit.    Review of Systems See HPI     Objective:   Physical Exam BP 125/79 mmHg  Pulse 85  Temp(Src) 98.2 F (36.8 C) (Oral)  Ht 5\' 1"  (1.549 m)  Wt 263 lb (119.296 kg)  BMI 49.72 kg/m2  LMP 07/15/2014 Gen: NAD, alert, cooperative with exam, obese female  Back:  Palpation: tenderness of paraspinal muscles yes on right side, spinous process no; pelvis no  Hip:  Palpation: tenderness of greater trochanter no Rotation Reduced: internal pain in right lower back with right leg. Normal with left leg, external pain in lower back on right leg but no pain with left leg  Neuro: Strength hip flexion 5/5,  knee extension 5/5, knee flexion 5/5, dorsiflexion 5/5, plantar flexion 5/5 Reflexes: patella  1/2 Bilateral   Straight Leg Raise: negative Sensation to light touch intact: yes Neurovascularly intact     Assessment & Plan:

## 2014-08-12 NOTE — Assessment & Plan Note (Addendum)
Continue to have pain most likely related to muscle strain. MRI revealing no neural compression and negative straight leg raise. She is de-conditioned.  - Need for exercise and stretching.  - ICE or Heat  - tylenol PRN  - Could consider PT  - wrote a note excusing from work until 11/18.

## 2014-08-12 NOTE — Assessment & Plan Note (Signed)
MRI showing no neural compression.  - continue to monitor.

## 2014-08-22 ENCOUNTER — Encounter: Payer: Self-pay | Admitting: Family Medicine

## 2014-08-22 NOTE — Progress Notes (Unsigned)
Patient dropped form (Consent to donate plasma) again because they requested it to be completed and signed on the same form. Please follow up with Patient.

## 2014-08-27 NOTE — Progress Notes (Unsigned)
Form filled out and placed in PCP box for completion.

## 2014-08-29 ENCOUNTER — Telehealth: Payer: Self-pay | Admitting: Family Medicine

## 2014-08-29 ENCOUNTER — Encounter: Payer: Self-pay | Admitting: Internal Medicine

## 2014-08-29 NOTE — Telephone Encounter (Signed)
Filled out form that patient is ok to give plasma.   Rosemarie Ax, MD PGY-2, [facility] 08/29/2014, 4:46 PM

## 2014-08-30 NOTE — Telephone Encounter (Signed)
Left voice message for pt informing her that form is ready for pick up. Derl Barrow, RN

## 2014-09-20 ENCOUNTER — Encounter: Payer: Self-pay | Admitting: Internal Medicine

## 2014-09-20 ENCOUNTER — Ambulatory Visit (INDEPENDENT_AMBULATORY_CARE_PROVIDER_SITE_OTHER): Payer: BC Managed Care – PPO | Admitting: Internal Medicine

## 2014-09-20 VITALS — BP 110/66 | HR 80 | Ht 61.0 in | Wt 268.6 lb

## 2014-09-20 DIAGNOSIS — R194 Change in bowel habit: Secondary | ICD-10-CM

## 2014-09-20 DIAGNOSIS — R159 Full incontinence of feces: Secondary | ICD-10-CM

## 2014-09-20 MED ORDER — PEG-KCL-NACL-NASULF-NA ASC-C 100 G PO SOLR: 1.0000 | Freq: Once | ORAL | Status: DC

## 2014-09-20 NOTE — Patient Instructions (Addendum)
Your colonoscopy is scheduled on 11/01/2014 at 8:30am Separate instructions have been given We also have given you a work note for extra bathroom breaks Use Fiber daily (Metimucil) to bulk up your stools   1TBS Twice a day

## 2014-09-20 NOTE — Progress Notes (Signed)
Patient ID: Elizabeth Diaz, female   DOB: 22-Nov-1965, 48 y.o.   MRN: [id number] HPI: Elizabeth Diaz is a 48 yo female with PMH of arthritis who is seen in consultation at the request of Clearance Coots, M.D. to evaluate fecal incontinence. The patient is here alone today. She reports that she is had trouble with fecal incontinence over the last few years but this seems to be worsening. She states she simply cannot hold "my bowels". There are times when she passes stool and she is not aware of it. Stools are almost always loose. Occasionally she will wake up from sleep in the middle of the night and noticed she has had a small bowel movement. She is having bowel movements 5-10 times a day which are mostly loose but she denies watery diarrhea. She started taking iron pills because she was told when she was trying to donate plasma that her iron was low. With the iron pills her stools have become slightly less loose but she is still having issues with encopresis. She very occasionally has lower abdominal cramping pain but not an ongoing problem. Several months ago she had 2 days of bright red blood per rectum but none recently. She reports a good appetite. No weight loss in fact some weight gain. No nausea or vomiting. Denies a family history of known IBD or colorectal cancer. She has never had a colonoscopy. Only cancer history in her family is maternal grandfather of unknown type. She reports the fecal leakage has interfered with her ability to work and her bosses been questioning why she constantly needs to go to the bathroom. She reports she is scared of losing stool at work. She has had vaginal births for 2 now grown children. She does have regular periods usually heavy.  She did have an MRI of her lower back which showed disc disease.  Past Medical History  Diagnosis Date  . Pneumothorax   . Arthritis     Past Surgical History  Procedure Laterality Date  . Chest tube insertion    . Tubal ligation       Outpatient Prescriptions Prior to Visit  Medication Sig Dispense Refill  . cyclobenzaprine (FLEXERIL) 10 MG tablet Take 0.5-1 tablets (5-10 mg total) by mouth 3 (three) times daily as needed for muscle spasms. 60 tablet 0  . gabapentin (NEURONTIN) 100 MG capsule Take 1 capsule (100 mg total) by mouth 3 (three) times daily. 90 capsule 0  . HYDROcodone-acetaminophen (NORCO) 5-325 MG per tablet Take 1-2 tablets by mouth every 6 (six) hours as needed for severe pain. 6 tablet 0  . naproxen (NAPROSYN) 500 MG tablet Take 1 tablet (500 mg total) by mouth 2 (two) times daily as needed for mild pain, moderate pain or headache (TAKE WITH MEALS.). 20 tablet 0   No facility-administered medications prior to visit.    No Known Allergies  Family History  Problem Relation Age of Onset  . Heart failure Father   . Diabetes Maternal Grandmother   . Diabetes Paternal Grandfather   . Diabetes Father   . Cancer Maternal Grandfather     History  Substance Use Topics  . Smoking status: Former Smoker -- 1.50 packs/day for 20 years    Types: Cigarettes    Quit date: 05/04/2009  . Smokeless tobacco: Never Used  . Alcohol Use: 0.0 oz/week    0 Not specified per week     Comment: drinks liquor every night. 2-3 glasses     ROS: As per  history of present illness, otherwise negative  BP 110/66 mmHg  Pulse 80  Ht 5\' 1"  (1.549 m)  Wt 268 lb 9.6 oz (121.836 kg)  BMI 50.78 kg/m2  LMP 09/10/2014 (Approximate) Constitutional: Well-developed and well-nourished. No distress. HEENT: Normocephalic and atraumatic. Oropharynx is clear and moist. No oropharyngeal exudate. Conjunctivae are normal.  No scleral icterus. Neck: Neck supple. Trachea midline. Cardiovascular: Normal rate, regular rhythm and intact distal pulses. No M/R/G Pulmonary/chest: Effort normal and breath sounds normal. No wheezing, rales or rhonchi. Abdominal: Soft, obese, nontender, nondistended. Bowel sounds active throughout.   Extremities: no clubbing, cyanosis, or edema Neurological: Alert and oriented to person place and time. Skin: Skin is warm and dry. No rashes noted. Psychiatric: Normal mood and affect. Behavior is normal.  RELEVANT LABS AND IMAGING: CBC    Component Value Date/Time   WBC 9.2 07/23/2014 1434   RBC 4.06 07/23/2014 1434   HGB 12.3 07/23/2014 1434   HCT 35.1* 07/23/2014 1434   PLT 347 07/23/2014 1434   MCV 86.5 07/23/2014 1434   MCH 30.3 07/23/2014 1434   MCHC 35.0 07/23/2014 1434   RDW 13.9 07/23/2014 1434   LYMPHSABS 2.8 02/14/2014 1607   MONOABS 0.5 02/14/2014 1607   EOSABS 0.4 02/14/2014 1607   BASOSABS 0.0 02/14/2014 1607    CMP     Component Value Date/Time   NA 139 07/23/2014 1434   K 4.4 07/23/2014 1434   CL 101 07/23/2014 1434   CO2 30 07/23/2014 1434   GLUCOSE 93 07/23/2014 1434   BUN 10 07/23/2014 1434   CREATININE 0.63 07/23/2014 1434   CREATININE 0.65 02/14/2014 1607   CALCIUM 8.9 07/23/2014 1434   PROT 8.0 03/30/2013 1637   ALBUMIN 3.5 03/30/2013 1637   AST 13 03/30/2013 1637   ALT 13 03/30/2013 1637   ALKPHOS 80 03/30/2013 1637   BILITOT 0.3 03/30/2013 1637   GFRNONAA >90 02/14/2014 1607   GFRAA >90 02/14/2014 1607    ASSESSMENT/PLAN: 48 yo female with PMH of arthritis who is seen in consultation at the request of Clearance Coots, M.D. to evaluate fecal incontinence.  1. Fecal incontinence -- I have recommended colonoscopy for direct visualization and rule out structural abnormality causing her symptoms. Prior to sedating her for her colonoscopy I will plan to perform a thorough rectal exam to evaluate anal rectal tone/sphincter pressure. I recommended Benefiber or Metamucil to help bulk her stools and hopefully give her more control. Hemoglobin was normal in October and cells were normocytic. We discussed colonoscopy including the risks and benefits and she is agreeable to proceed. It needs to be performed in the hospital setting due to her BMI. This  will be performed with MAC. If: Structurally normal, consider anorectal manometry.

## 2014-10-22 ENCOUNTER — Other Ambulatory Visit: Payer: Self-pay

## 2014-10-22 ENCOUNTER — Encounter (HOSPITAL_COMMUNITY): Payer: Self-pay | Admitting: *Deleted

## 2014-10-22 DIAGNOSIS — R194 Change in bowel habit: Secondary | ICD-10-CM

## 2014-10-22 DIAGNOSIS — R159 Full incontinence of feces: Secondary | ICD-10-CM

## 2014-10-22 MED ORDER — PEG-KCL-NACL-NASULF-NA ASC-C 100 G PO SOLR
1.0000 | Freq: Once | ORAL | Status: DC
Start: 2014-10-22 — End: 2014-11-01

## 2014-11-01 ENCOUNTER — Ambulatory Visit (HOSPITAL_COMMUNITY): Payer: BLUE CROSS/BLUE SHIELD | Admitting: Registered Nurse

## 2014-11-01 ENCOUNTER — Encounter (HOSPITAL_COMMUNITY)
Admission: RE | Disposition: A | Discharge status: Home / Self Care | Payer: Self-pay | Source: Ambulatory Visit | Attending: Internal Medicine

## 2014-11-01 ENCOUNTER — Encounter (HOSPITAL_COMMUNITY): Payer: Self-pay

## 2014-11-01 ENCOUNTER — Ambulatory Visit (HOSPITAL_COMMUNITY)
Admission: RE | Admit: 2014-11-01 | Discharge: 2014-11-01 | Disposition: A | Discharge status: Home / Self Care | Payer: BLUE CROSS/BLUE SHIELD | Source: Ambulatory Visit | Attending: Internal Medicine | Admitting: Internal Medicine

## 2014-11-01 DIAGNOSIS — Y909 Presence of alcohol in blood, level not specified: Secondary | ICD-10-CM | POA: Insufficient documentation

## 2014-11-01 DIAGNOSIS — Z6841 Body Mass Index (BMI) 40.0 and over, adult: Secondary | ICD-10-CM | POA: Insufficient documentation

## 2014-11-01 DIAGNOSIS — K5730 Diverticulosis of large intestine without perforation or abscess without bleeding: Secondary | ICD-10-CM | POA: Diagnosis not present

## 2014-11-01 DIAGNOSIS — R197 Diarrhea, unspecified: Secondary | ICD-10-CM | POA: Diagnosis not present

## 2014-11-01 DIAGNOSIS — R194 Change in bowel habit: Secondary | ICD-10-CM | POA: Insufficient documentation

## 2014-11-01 DIAGNOSIS — K621 Rectal polyp: Secondary | ICD-10-CM | POA: Diagnosis not present

## 2014-11-01 DIAGNOSIS — R159 Full incontinence of feces: Secondary | ICD-10-CM

## 2014-11-01 DIAGNOSIS — Z87891 Personal history of nicotine dependence: Secondary | ICD-10-CM | POA: Insufficient documentation

## 2014-11-01 DIAGNOSIS — K625 Hemorrhage of anus and rectum: Secondary | ICD-10-CM | POA: Diagnosis present

## 2014-11-01 DIAGNOSIS — K589 Irritable bowel syndrome without diarrhea: Secondary | ICD-10-CM | POA: Insufficient documentation

## 2014-11-01 DIAGNOSIS — D128 Benign neoplasm of rectum: Secondary | ICD-10-CM | POA: Insufficient documentation

## 2014-11-01 DIAGNOSIS — F1099 Alcohol use, unspecified with unspecified alcohol-induced disorder: Secondary | ICD-10-CM | POA: Diagnosis not present

## 2014-11-01 HISTORY — DX: Unspecified convulsions: R56.9

## 2014-11-01 HISTORY — PX: COLONOSCOPY WITH PROPOFOL: SHX5780

## 2014-11-01 HISTORY — DX: Anesthesia of skin: R20.0

## 2014-11-01 SURGERY — COLONOSCOPY WITH PROPOFOL
Anesthesia: Monitor Anesthesia Care

## 2014-11-01 MED ORDER — PROPOFOL 10 MG/ML IV BOLUS
INTRAVENOUS | Status: AC
  Filled 2014-11-01: qty 20

## 2014-11-01 MED ORDER — LACTATED RINGERS IV SOLN
INTRAVENOUS | Status: DC
  Administered 2014-11-01: 1000 mL via INTRAVENOUS

## 2014-11-01 MED ORDER — MIDAZOLAM HCL 2 MG/2ML IJ SOLN
INTRAMUSCULAR | Status: AC
  Filled 2014-11-01: qty 2

## 2014-11-01 MED ORDER — LIDOCAINE HCL (CARDIAC) 20 MG/ML IV SOLN
INTRAVENOUS | Status: DC | PRN
  Administered 2014-11-01: 100 mg via INTRAVENOUS

## 2014-11-01 MED ORDER — PROPOFOL INFUSION 10 MG/ML OPTIME
INTRAVENOUS | Status: DC | PRN
  Administered 2014-11-01: 140 ug/kg/min via INTRAVENOUS

## 2014-11-01 MED ORDER — SODIUM CHLORIDE 0.9 % IV SOLN: INTRAVENOUS | Status: DC

## 2014-11-01 SURGICAL SUPPLY — 21 items

## 2014-11-01 NOTE — Discharge Instructions (Signed)

## 2014-11-01 NOTE — H&P (Signed)
  HPI: Elizabeth Diaz is a 49 yo female with PMH of arthritis, obesity, lumbar disc disease who is seen in the office on 09/20/2014 to evaluate fecal incontinence. She presents today for outpatient colonoscopy. She has had also issues with loose stools and several occasions of bright red blood per rectum. No upper GI symptoms. No significant GI family history.  Past Medical History  Diagnosis Date  . Pneumothorax   . Arthritis   . Seizures     hx of, no seizures last few years  . Numbness     left leg    Past Surgical History  Procedure Laterality Date  . Chest tube insertion    . Tubal ligation       (Not in an outpatient encounter)  No Known Allergies  Family History  Problem Relation Age of Onset  . Heart failure Father   . Diabetes Maternal Grandmother   . Diabetes Paternal Grandfather   . Diabetes Father   . Cancer Maternal Grandfather     History  Substance Use Topics  . Smoking status: Former Smoker -- 1.50 packs/day for 20 years    Types: Cigarettes    Quit date: 05/04/2009  . Smokeless tobacco: Never Used  . Alcohol Use: 0.0 oz/week    0 Not specified per week     Comment: drinks liquor every night. 2-3 glasses     ROS: As per history of present illness, otherwise negative  BP 149/72 mmHg  Pulse 72  Temp(Src) 98 F (36.7 C) (Oral)  Resp 20  Ht 5\' 1"  (1.549 m)  Wt 268 lb (121.564 kg)  BMI 50.66 kg/m2  SpO2 99%  LMP 10/08/2014 Gen: awake, alert, NAD HEENT: anicteric, op clear CV: RRR, no mrg Pulm: CTA b/l Abd: soft, obese, NT/ND, +BS throughout Ext: no c/c, trace LE edema Neuro: nonfocal  RELEVANT LABS AND IMAGING: CBC    Component Value Date/Time   WBC 9.2 07/23/2014 1434   RBC 4.06 07/23/2014 1434   HGB 12.3 07/23/2014 1434   HCT 35.1* 07/23/2014 1434   PLT 347 07/23/2014 1434   MCV 86.5 07/23/2014 1434   MCH 30.3 07/23/2014 1434   MCHC 35.0 07/23/2014 1434   RDW 13.9 07/23/2014 1434   LYMPHSABS 2.8 02/14/2014 1607   MONOABS 0.5  02/14/2014 1607   EOSABS 0.4 02/14/2014 1607   BASOSABS 0.0 02/14/2014 1607    CMP     Component Value Date/Time   NA 139 07/23/2014 1434   K 4.4 07/23/2014 1434   CL 101 07/23/2014 1434   CO2 30 07/23/2014 1434   GLUCOSE 93 07/23/2014 1434   BUN 10 07/23/2014 1434   CREATININE 0.63 07/23/2014 1434   CREATININE 0.65 02/14/2014 1607   CALCIUM 8.9 07/23/2014 1434   PROT 8.0 03/30/2013 1637   ALBUMIN 3.5 03/30/2013 1637   AST 13 03/30/2013 1637   ALT 13 03/30/2013 1637   ALKPHOS 80 03/30/2013 1637   BILITOT 0.3 03/30/2013 1637   GFRNONAA >90 02/14/2014 1607   GFRAA >90 02/14/2014 1607    ASSESSMENT/PLAN: 49 year old presenting for outpatient colonoscopy to evaluate fecal incontinence and loose stools.  1. Fecal incontinence, loose stools -- colonoscopy today with monitored anesthesia care. The nature of the procedure, as well as the risks, benefits, and alternatives were carefully and thoroughly reviewed with the patient. Ample time for discussion and questions allowed. The patient understood, was satisfied, and agreed to proceed.

## 2014-11-01 NOTE — Op Note (Signed)
[facility] [place], [address]   COLONOSCOPY PROCEDURE REPORT  PATIENT: Elizabeth Diaz, Elizabeth Diaz  MR#: [id number] BIRTHDATE: 1966/06/25 , 48  yrs. old GENDER: female ENDOSCOPIST: Jerene Bears, MD REFERRED BY: Clearance Coots, MD PROCEDURE DATE:  11/01/2014 PROCEDURE:   Colonoscopy with biopsy and Colonoscopy with snare polypectomy First Screening Colonoscopy - Avg.  risk and is 50 yrs.  old or older - No.  Prior Negative Screening - Now for repeat screening. N/A  History of Adenoma - Now for follow-up colonoscopy & has been > or = to 3 yrs.  N/A  Polyps Removed Today? Yes. ASA CLASS:   Class III INDICATIONS:fecal incontinence, frequent loose stools, and intermittent rectal bleeding. MEDICATIONS: Monitored anesthesia care and Per Anesthesia  DESCRIPTION OF PROCEDURE:   After the risks benefits and alternatives of the procedure were thoroughly explained, informed consent was obtained.  The digital rectal exam revealed no rectal mass.   The Pentax Adult Colonoscope [id number]  endoscope was introduced through the anus and advanced to the cecum, which was identified by both the appendix and ileocecal valve. No adverse events experienced.   The quality of the prep was good, using MoviPrep  The instrument was then slowly withdrawn as the colon was fully examined.  COLON FINDINGS: Two sessile polyps ranging between 3-19mm in size were found in the rectum.  Polypectomies were performed with a cold snare.  The resection was complete, the polyp tissue was completely retrieved and sent to histology.   There was mild diverticulosis noted in the sigmoid colon.   The colonic mucosa appeared normal throughout the colon.  Multiple random biopsies were performed using cold forceps.  Samples were sent to R/O microscopic colitis. Retroflexion was not performed. The time to cecum=8 minutes 00 seconds.  Withdrawal time=18 minutes 00 seconds.  The scope was withdrawn and the  procedure completed. COMPLICATIONS: There were no immediate complications.  ENDOSCOPIC IMPRESSION: 1.   Two sessile polyps ranging between 3-47mm in size were found in the rectum; polypectomies were performed with a cold snare 2.   Mild diverticulosis was noted in the sigmoid colon 3.   The colonic mucosa appeared normal; multiple random biopsies were performed using cold forceps  RECOMMENDATIONS: 1.  Await pathology results 2.  High fiber diet, continue Benefiber 1 tablespoon twice daily with meals 3.  Office follow with me and if symptoms persist unexplained would recommend anorectal manometry  eSigned:  Jerene Bears, MD 11/01/2014 9:41 AM   cc: The Patient; Clearance Coots, MD

## 2014-11-01 NOTE — Anesthesia Postprocedure Evaluation (Signed)
  Anesthesia Post-op Note  Patient: Elizabeth Diaz  Procedure(s) Performed: Procedure(s): COLONOSCOPY WITH PROPOFOL (N/A)  Patient Location: PACU  Anesthesia Type:MAC  Level of Consciousness: awake and alert   Airway and Oxygen Therapy: Patient Spontanous Breathing  Post-op Pain: none  Post-op Assessment: Post-op Vital signs reviewed  Post-op Vital Signs: Reviewed  Last Vitals:  Filed Vitals:   11/01/14 0932  BP: 125/54  Pulse: 100  Temp: 37 C  Resp: 22    Complications: No apparent anesthesia complications

## 2014-11-01 NOTE — Anesthesia Procedure Notes (Signed)
Date/Time: 11/01/2014 8:50 AM Performed by: Lollie Sails Oxygen Delivery Method: Simple face mask

## 2014-11-01 NOTE — Anesthesia Preprocedure Evaluation (Addendum)
Anesthesia Evaluation  Patient identified by MRN, date of birth, ID band Patient awake    Reviewed: Allergy & Precautions, NPO status , Patient's Chart, lab work & pertinent test results  Airway Mallampati: II  TM Distance: >3 FB Neck ROM: Full    Dental  (+) Teeth Intact   Pulmonary sleep apnea (Likely OSA) , former smoker,  breath sounds clear to auscultation        Cardiovascular negative cardio ROS  Rhythm:Regular Rate:Normal     Neuro/Psych Seizures -,     GI/Hepatic negative GI ROS, Neg liver ROS,   Endo/Other  Morbid obesity  Renal/GU negative Renal ROS     Musculoskeletal  (+) Arthritis -,   Abdominal   Peds  Hematology   Anesthesia Other Findings   Reproductive/Obstetrics                          Anesthesia Physical Anesthesia Plan  ASA: III  Anesthesia Plan: MAC   Post-op Pain Management:    Induction: Intravenous  Airway Management Planned: Natural Airway and Simple Face Mask  Additional Equipment:   Intra-op Plan:   Post-operative Plan:   Informed Consent: I have reviewed the patients History and Physical, chart, labs and discussed the procedure including the risks, benefits and alternatives for the proposed anesthesia with the patient or authorized representative who has indicated his/her understanding and acceptance.     Plan Discussed with: CRNA  Anesthesia Plan Comments:         Anesthesia Quick Evaluation

## 2014-11-01 NOTE — Transfer of Care (Signed)
Immediate Anesthesia Transfer of Care Note  Patient: Elizabeth Diaz  Procedure(s) Performed: Procedure(s): COLONOSCOPY WITH PROPOFOL (N/A)  Patient Location: PACU and Endoscopy Unit  Anesthesia Type:MAC  Level of Consciousness: awake, alert , oriented and patient cooperative  Airway & Oxygen Therapy: Patient Spontanous Breathing and Patient connected to face mask oxygen  Post-op Assessment: Report given to RN, Post -op Vital signs reviewed and stable and Patient moving all extremities  Post vital signs: Reviewed and stable  Last Vitals:  Filed Vitals:   11/01/14 0850  BP: 159/73  Pulse:   Temp:   Resp:     Complications: No apparent anesthesia complications

## 2014-11-02 ENCOUNTER — Encounter (HOSPITAL_COMMUNITY): Payer: Self-pay | Admitting: Internal Medicine

## 2014-11-06 ENCOUNTER — Encounter: Payer: Self-pay | Admitting: Internal Medicine

## 2014-12-07 ENCOUNTER — Encounter: Payer: Self-pay | Admitting: Family Medicine

## 2014-12-07 ENCOUNTER — Ambulatory Visit (INDEPENDENT_AMBULATORY_CARE_PROVIDER_SITE_OTHER): Payer: BLUE CROSS/BLUE SHIELD | Admitting: Family Medicine

## 2014-12-07 VITALS — BP 127/69 | HR 76 | Ht 61.0 in | Wt 261.0 lb

## 2014-12-07 DIAGNOSIS — M25561 Pain in right knee: Secondary | ICD-10-CM

## 2014-12-07 DIAGNOSIS — G8929 Other chronic pain: Secondary | ICD-10-CM | POA: Insufficient documentation

## 2014-12-07 DIAGNOSIS — M25562 Pain in left knee: Secondary | ICD-10-CM | POA: Diagnosis not present

## 2014-12-07 MED ORDER — NAPROXEN 500 MG PO TABS: 500.0000 mg | ORAL_TABLET | Freq: Two times a day (BID) | ORAL | Status: DC

## 2014-12-07 MED ORDER — TRAMADOL HCL 50 MG PO TABS: 50.0000 mg | ORAL_TABLET | Freq: Three times a day (TID) | ORAL | Status: DC | PRN

## 2014-12-07 NOTE — Progress Notes (Signed)
Patient ID: Elizabeth Diaz, female   DOB: 07-10-1966, 49 y.o.   MRN: [id number]    Subjective: CC: bilaterally knee pain HPI: Patient is a 49 y.o. female presenting to clinic today for bilateral knee pain. Concerns today include:  1. EXTREMITY PAIN  Location: Knee pain bilaterally more severe on the right Pain started: A few weeks ago that is progressively worse  Pain is: "Stiff" when standing, sharp pain when walking (goes into calf on the right side). Pain worse when she gets out of bed. Pain will improve when she's moving.  Severity: 8/10 on the right, 5/10 on the left. Pain eases when walking for a while until she stops or sits down.  Medications tried: None  Recent trauma: None (has a h/o arthritis). Fall often due to legs giving out- last fell a few weeks ago. States the frequency of her falls have not increased recently. Similar pain previously: Yes, "for a while" (several years), however getting worse since she's had a URI vs flu  Symptoms Redness: None  Swelling: None  Fever: None  Weakness: Stable from several years ago Weight loss: None Rash: None  Social History: doesn't smoke   ROS: All other systems reviewed and are negative.  Past Medical History Patient Active Problem List   Diagnosis Date Noted  . Knee pain 12/07/2014  . Change in bowel habits   . Benign neoplasm of rectum   . Frequent loose stools   . Right-sided back pain 05/14/2014  . Fecal incontinence 05/06/2014  . Pain 05/06/2014    Medications- reviewed and updated Current Outpatient Prescriptions  Medication Sig Dispense Refill  . cyclobenzaprine (FLEXERIL) 10 MG tablet Take 0.5-1 tablets (5-10 mg total) by mouth 3 (three) times daily as needed for muscle spasms. 60 tablet 0  . gabapentin (NEURONTIN) 100 MG capsule Take 1 capsule (100 mg total) by mouth 3 (three) times daily. 90 capsule 0  . ibuprofen (ADVIL,MOTRIN) 200 MG tablet Take 400-800 mg by mouth every 6 (six) hours as needed for  moderate pain.    . naproxen (NAPROSYN) 500 MG tablet Take 1 tablet (500 mg total) by mouth 2 (two) times daily with a meal. For 5 days, then take as needed for pain. 30 tablet 0  . traMADol (ULTRAM) 50 MG tablet Take 1 tablet (50 mg total) by mouth every 8 (eight) hours as needed. 30 tablet 0   No current facility-administered medications for this visit.    Objective: Office vital signs reviewed. BP 127/69 mmHg  Pulse 76  Ht 5\' 1"  (1.549 m)  Wt 261 lb (118.389 kg)  BMI 49.34 kg/m2   Physical Examination:  General: Awake, alert, well- nourished, NAD MSK: No erythema, warmth, effusions of the knees. Negative grind test. Negative anterior and posterior drawer test. Negative Lachman, normal valgus and varus stress test. Negative McMurray. Normal gait and station. 4 out of 5 strength in knee flexion and extension on the right, 5 out of 5 on the left Skin: dry, intact, no rashes or lesions Neuro: Sensation grossly intact, Normal patellar DTRs  Assessment/Plan: Knee pain This is an obese patient with a known history of arthritis (noted on spinal imaging) presenting with bilateral knee pain, more severe on the right than the left. Physical exam is not concerning for ligament or meniscus problems. Gradual onset of symptoms appears to go against fracture. This is most likely secondary to osteoarthritis. No red flags on exam. -Discussed ice and heat and strengthening of her quadriceps -Naproxen 500 mg  twice a day for 5 days, then as needed -Tramadol 50 mg as needed for pain, discussed that this could make you sleepy -In the future, the patient may require PT and/or a steroid injection -Return to clinic precautions discussed     No orders of the defined types were placed in this encounter.    Meds ordered this encounter  Medications  . DISCONTD: naproxen (NAPROSYN) 500 MG tablet    Sig: Take 1 tablet (500 mg total) by mouth 2 (two) times daily with a meal.    Dispense:  30 tablet     Refill:  0  . traMADol (ULTRAM) 50 MG tablet    Sig: Take 1 tablet (50 mg total) by mouth every 8 (eight) hours as needed.    Dispense:  30 tablet    Refill:  0  . naproxen (NAPROSYN) 500 MG tablet    Sig: Take 1 tablet (500 mg total) by mouth 2 (two) times daily with a meal. For 5 days, then take as needed for pain.    Dispense:  30 tablet    Refill:  [place] PGY-1, [place]

## 2014-12-07 NOTE — Patient Instructions (Addendum)
Arthritis is a disease that causes soreness and inflammation of a joint. It occurs when the cartilage at the affected joint wears down. Cartilage acts as a cushion, covering the ends of bones where they meet to form a joint. Osteoarthritis is the most common form of arthritis. It often occurs in older people. The joints affected most often by this condition include those in the:  Ends of the fingers.  Thumbs.  Neck.  Lower back.  Knees.  Hips. CAUSES  Over time, the cartilage that covers the ends of bones begins to wear away. This causes bone to rub on bone, producing pain and stiffness in the affected joints.  RISK FACTORS Certain factors can increase your chances of having osteoarthritis, including:  Older age.  Excessive body weight.  Overuse of joints.  Previous joint injury. SIGNS AND SYMPTOMS   Pain, swelling, and stiffness in the joint.  Over time, the joint may lose its normal shape.  Small deposits of bone (osteophytes) may grow on the edges of the joint.  Bits of bone or cartilage can break off and float inside the joint space. This may cause more pain and damage. DIAGNOSIS  Your health care provider will do a physical exam and ask about your symptoms. Various tests may be ordered, such as:  X-rays of the affected joint.  An MRI scan.  Blood tests to rule out other types of arthritis.  Joint fluid tests. This involves using a needle to draw fluid from the joint and examining the fluid under a microscope. TREATMENT  Goals of treatment are to control pain and improve joint function. Treatment plans may include:  A prescribed exercise program that allows for rest and joint relief.  A weight control plan.  Pain relief techniques, such as:  Properly applied heat and cold.  Electric pulses delivered to nerve endings under the skin (transcutaneous electrical nerve stimulation [TENS]).  Massage.  Certain nutritional supplements.  Medicines to control  pain, such as:  Acetaminophen.  Nonsteroidal anti-inflammatory drugs (NSAIDs), such as naproxen.  Narcotic or central-acting agents, such as tramadol.  Corticosteroids. These can be given orally or as an injection.  Surgery to reposition the bones and relieve pain (osteotomy) or to remove loose pieces of bone and cartilage. Joint replacement may be needed in advanced states of osteoarthritis. HOME CARE INSTRUCTIONS   Take medicines only as directed by your health care provider.  Maintain a healthy weight. Follow your health care provider's instructions for weight control. This may include dietary instructions.  Exercise as directed. Your health care provider can recommend specific types of exercise. These may include:  Strengthening exercises. These are done to strengthen the muscles that support joints affected by arthritis. They can be performed with weights or with exercise bands to add resistance.  Aerobic activities. These are exercises, such as brisk walking or low-impact aerobics, that get your heart pumping.  Range-of-motion activities. These keep your joints limber.  Balance and agility exercises. These help you maintain daily living skills.  Rest your affected joints as directed by your health care provider.  Keep all follow-up visits as directed by your health care provider. SEEK MEDICAL CARE IF:   Your skin turns red.  You develop a rash in addition to your joint pain.  You have worsening joint pain.  You have a fever along with joint or muscle aches. SEEK IMMEDIATE MEDICAL CARE IF:  You have a significant loss of weight or appetite.  You have night sweats. FOR MORE  [place] of Arthritis and Musculoskeletal and Skin Diseases: www.niams.SouthExposed.es  [employer] on Aging: http://kim-miller.com/  American College of Rheumatology: www.rheumatology.org Document Released: 09/14/2005 Document Revised: 01/29/2014 Document Reviewed:  05/22/2013 [facility] Patient Information 2015 [place], [place]. This information is not intended to replace advice given to you by your health care provider. Make sure you discuss any questions you have with your health care provider.

## 2014-12-07 NOTE — Assessment & Plan Note (Addendum)
This is an obese patient with a known history of arthritis (noted on spinal imaging) presenting with bilateral knee pain, more severe on the right than the left. Physical exam is not concerning for ligament or meniscus problems. Gradual onset of symptoms appears to go against fracture. This is most likely secondary to osteoarthritis. No red flags on exam. -Discussed ice and heat and strengthening of her quadriceps -Naproxen 500 mg twice a day for 5 days, then as needed -Tramadol 50 mg as needed for pain, discussed that this could make you sleepy -In the future, the patient may require PT and/or a steroid injection -Return to clinic precautions discussed

## 2014-12-14 ENCOUNTER — Encounter: Payer: Self-pay | Admitting: *Deleted

## 2014-12-31 ENCOUNTER — Ambulatory Visit: Payer: BLUE CROSS/BLUE SHIELD | Admitting: Internal Medicine

## 2015-01-25 ENCOUNTER — Emergency Department (HOSPITAL_COMMUNITY)
Admission: EM | Admit: 2015-01-25 | Discharge: 2015-01-25 | Disposition: A | Discharge status: Home / Self Care | Payer: BLUE CROSS/BLUE SHIELD | Attending: Emergency Medicine | Admitting: Emergency Medicine

## 2015-01-25 ENCOUNTER — Encounter (HOSPITAL_COMMUNITY): Payer: Self-pay | Admitting: Emergency Medicine

## 2015-01-25 DIAGNOSIS — G40909 Epilepsy, unspecified, not intractable, without status epilepticus: Secondary | ICD-10-CM | POA: Insufficient documentation

## 2015-01-25 DIAGNOSIS — M25561 Pain in right knee: Secondary | ICD-10-CM | POA: Diagnosis not present

## 2015-01-25 DIAGNOSIS — Z86010 Personal history of colonic polyps: Secondary | ICD-10-CM | POA: Diagnosis not present

## 2015-01-25 DIAGNOSIS — Z87891 Personal history of nicotine dependence: Secondary | ICD-10-CM | POA: Insufficient documentation

## 2015-01-25 DIAGNOSIS — M1990 Unspecified osteoarthritis, unspecified site: Secondary | ICD-10-CM | POA: Diagnosis not present

## 2015-01-25 DIAGNOSIS — M25562 Pain in left knee: Secondary | ICD-10-CM | POA: Diagnosis present

## 2015-01-25 DIAGNOSIS — Z8709 Personal history of other diseases of the respiratory system: Secondary | ICD-10-CM | POA: Diagnosis not present

## 2015-01-25 DIAGNOSIS — Z79899 Other long term (current) drug therapy: Secondary | ICD-10-CM | POA: Diagnosis not present

## 2015-01-25 MED ORDER — TRAMADOL HCL 50 MG PO TABS: 50.0000 mg | ORAL_TABLET | Freq: Four times a day (QID) | ORAL | Status: DC | PRN

## 2015-01-25 NOTE — Discharge Instructions (Signed)

## 2015-01-25 NOTE — ED Provider Notes (Signed)
CSN: [id number]     Arrival date & time 01/25/15  0935 History  This chart was scribed for non-physician practitioner, Glendell Docker, NP, working with Daleen Bo, MD by Ladene Artist, ED Scribe. This patient was seen in room TR07C/TR07C and the patient's care was started at 10:07 AM.   Chief Complaint  Patient presents with  . Knee Pain   The history is provided by the patient. No language interpreter was used.   HPI Comments: Elizabeth Diaz is a 49 y.o. female, with a h/o arthritis, seizures, who presents to the Emergency Department complaining of gradually worsening, 8/10 bilateral knee pain for the past few days. Pt reports stiffness with standing for long periods of time and within 5 minutes of sitting. She also reports bilateral hip pain. Pt denies recent falls or injury. She states that her PCP prescribes her medication for pain but she is currently out of medication. No h/o DM.  PCP: Rosemarie Ax, MD  Past Medical History  Diagnosis Date  . Pneumothorax   . Arthritis   . Seizures     hx of, no seizures last few years  . Numbness     left leg  . Hyperplastic colon polyp    Past Surgical History  Procedure Laterality Date  . Chest tube insertion    . Tubal ligation    . Colonoscopy with propofol N/A 11/01/2014    Procedure: COLONOSCOPY WITH PROPOFOL;  Surgeon: Jerene Bears, MD;  Location: [facility];  Service: Gastroenterology;  Laterality: N/A;   Family History  Problem Relation Age of Onset  . Heart failure Father   . Diabetes Maternal Grandmother   . Diabetes Paternal Grandfather   . Diabetes Father   . Cancer Maternal Grandfather     unknown type   History  Substance Use Topics  . Smoking status: Former Smoker -- 1.50 packs/day for 20 years    Types: Cigarettes    Quit date: 05/04/2009  . Smokeless tobacco: Never Used  . Alcohol Use: 0.0 oz/week    0 Standard drinks or equivalent per week     Comment: drinks liquor every night. 2-3 glasses    OB  History    No data available     Review of Systems  Musculoskeletal: Positive for arthralgias.  All other systems reviewed and are negative.  Allergies  Review of patient's allergies indicates no known allergies.  Home Medications   Prior to Admission medications   Medication Sig Start Date End Date Taking? Authorizing Provider  cyclobenzaprine (FLEXERIL) 10 MG tablet Take 0.5-1 tablets (5-10 mg total) by mouth 3 (three) times daily as needed for muscle spasms. 08/02/14   Rosemarie Ax, MD  gabapentin (NEURONTIN) 100 MG capsule Take 1 capsule (100 mg total) by mouth 3 (three) times daily. 07/23/14   Leeanne Rio, MD  ibuprofen (ADVIL,MOTRIN) 200 MG tablet Take 400-800 mg by mouth every 6 (six) hours as needed for moderate pain.    Historical Provider, MD  naproxen (NAPROSYN) 500 MG tablet Take 1 tablet (500 mg total) by mouth 2 (two) times daily with a meal. For 5 days, then take as needed for pain. 12/07/14   Archie Patten, MD  traMADol (ULTRAM) 50 MG tablet Take 1 tablet (50 mg total) by mouth every 8 (eight) hours as needed. 12/07/14   Archie Patten, MD   BP 129/64 mmHg  Pulse 85  Temp(Src) 97.5 F (36.4 C) (Oral)  Resp 20  SpO2 100% Physical  Exam  Constitutional: She is oriented to person, place, and time. She appears well-developed and well-nourished. No distress.  HENT:  Head: Normocephalic and atraumatic.  Eyes: Conjunctivae and EOM are normal.  Neck: Neck supple. No tracheal deviation present.  Cardiovascular: Normal rate.   Pulmonary/Chest: Effort normal. No respiratory distress.  Musculoskeletal: Normal range of motion.  No redness swelling or warmth to bilateral knee. Pt has full rom  Neurological: She is alert and oriented to person, place, and time.  Skin: Skin is warm and dry.  Psychiatric: She has a normal mood and affect. Her behavior is normal.  Nursing note and vitals reviewed.  ED Course  Procedures (including critical care time) DIAGNOSTIC  STUDIES: Oxygen Saturation is 100% on RA, normal by my interpretation.    COORDINATION OF CARE: 10:09 AM-Discussed treatment plan which includes Ultram with pt at bedside and pt agreed to plan.   Labs Review Labs Reviewed - No data to display  Imaging Review No results found.   EKG Interpretation None      MDM   Final diagnoses:  Arthralgia of both knees    Will treat with ultram for pain and pt can follow up with pcp for continued symptoms. Without acute injury doubt that pt needs imaging at this time  I personally performed the services described in this documentation, which was scribed in my presence. The recorded information has been reviewed and is accurate.    Glendell Docker, NP 01/25/15 [place], MD 01/25/15 [phone]

## 2015-01-25 NOTE — ED Notes (Signed)
PA at bedside.

## 2015-01-25 NOTE — ED Notes (Signed)
PT ambulated with baseline gait; VSS; A&Ox3; no signs of distress; respirations even and unlabored; skin warm and dry; no questions upon discharge.  

## 2015-01-25 NOTE — ED Notes (Signed)
Pt has hx of arthritis; could not get in with her MD. 8/10 bilateral knee and hip pain.

## 2015-01-28 ENCOUNTER — Encounter: Payer: Self-pay | Admitting: Family Medicine

## 2015-01-28 ENCOUNTER — Ambulatory Visit (INDEPENDENT_AMBULATORY_CARE_PROVIDER_SITE_OTHER): Payer: BLUE CROSS/BLUE SHIELD | Admitting: Family Medicine

## 2015-01-28 VITALS — Temp 98.8°F | Ht 61.0 in | Wt 259.0 lb

## 2015-01-28 DIAGNOSIS — M25561 Pain in right knee: Secondary | ICD-10-CM

## 2015-01-28 DIAGNOSIS — M25562 Pain in left knee: Secondary | ICD-10-CM

## 2015-01-28 DIAGNOSIS — R42 Dizziness and giddiness: Secondary | ICD-10-CM | POA: Diagnosis not present

## 2015-01-28 MED ORDER — METHYLPREDNISOLONE ACETATE 40 MG/ML IJ SUSP: 80.0000 mg | Freq: Once | INTRAMUSCULAR | Status: DC

## 2015-01-28 MED ORDER — METHYLPREDNISOLONE ACETATE 40 MG/ML IJ SUSP
80.0000 mg | Freq: Once | INTRAMUSCULAR | Status: AC
  Administered 2015-01-28: 80 mg via INTRA_ARTICULAR

## 2015-01-28 NOTE — Assessment & Plan Note (Signed)
Injections performed today.  Naproxen and tramadol not helping. 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   - naproxen PRN  - ICE and home modalities  - if no improvement then return and try PT, consider referral to sports med for orthotics. If trial of injections shows no improvement then consider referral to ports med for synvisc

## 2015-01-28 NOTE — Patient Instructions (Signed)
Thank you for coming in,   Please come back on May 9th at 10:15 am if you would like your toe nail removed.   Please schedule a pap smear in the near future.    Your knees should start feeling better soon.  Ice, elevation and compression will help reduce your pain.    If you have any redness or develop a fever, please call us or schedule a visit soon.   Please bring all of your medications with you to each visit.    Please feel free to call with any questions or concerns at any time, at [phone]. --Dr. Raeford Razor  Knee Injection Joint injections are shots. Your caregiver will place a needle into your knee joint. The needle is used to put medicine into the joint. These shots can be used to help treat different painful knee conditions such as osteoarthritis, bursitis, local flare-ups of rheumatoid arthritis, and pseudogout. Anti-inflammatory medicines such as corticosteroids and anesthetics are the most common medicines used for joint and soft tissue injections.  PROCEDURE  The skin over the kneecap will be cleaned with an antiseptic solution.  Your caregiver will inject a small amount of a local anesthetic (a medicine like Novocaine) just under the skin in the area that was cleaned.  After the area becomes numb, a second injection is done. This second injection usually includes an anesthetic and an anti-inflammatory medicine called a steroid or cortisone. The needle is carefully placed in between the kneecap and the knee, and the medicine is injected into the joint space.  After the injection is done, the needle is removed. Your caregiver may place a bandage over the injection site. The whole procedure takes no more than a couple of minutes. BEFORE THE PROCEDURE  Wash all of the skin around the entire knee area. Try to remove any loose, scaling skin. There is no other specific preparation necessary unless advised otherwise by your caregiver. LET YOUR CAREGIVER KNOW ABOUT:    Allergies.  Medications taken including herbs, eye drops, over the counter medications, and creams.  Use of steroids (by mouth or creams).  Possible pregnancy, if applicable.  Previous problems with anesthetics or Novocaine.  History of blood clots (thrombophlebitis).  History of bleeding or blood problems.  Previous surgery.  Other health problems. RISKS AND COMPLICATIONS Side effects from cortisone shots are rare. They include:   Slight bruising of the skin.  Shrinkage of the normal fatty tissue under the skin where the shot was given.  Increase in pain after the shot.  Infection.  Weakening of tendons or tendon rupture.  Allergic reaction to the medicine.  Diabetics may have a temporary increase in their blood sugar after a shot.  Cortisone can temporarily weaken the immune system. While receiving these shots, you should not get certain vaccines. Also, avoid contact with anyone who has chickenpox or measles. Especially if you have never had these diseases or have not been previously immunized. Your immune system may not be strong enough to fight off the infection while the cortisone is in your system. AFTER THE PROCEDURE   You can go home after the procedure.  You may need to put ice on the joint 15-20 minutes every 3 or 4 hours until the pain goes away.  You may need to put an elastic bandage on the joint. HOME CARE INSTRUCTIONS   Only take over-the-counter or prescription medicines for pain, discomfort, or fever as directed by your caregiver.  You should avoid stressing the joint. Unless  advised otherwise, avoid activities that put a lot of pressure on a knee joint, such as:  Jogging.  Bicycling.  Recreational climbing.  Hiking.  Laying down and elevating the leg/knee above the level of your heart can help to minimize swelling. SEEK MEDICAL CARE IF:   You have repeated or worsening swelling.  There is drainage from the puncture area.  You  develop red streaking that extends above or below the site where the needle was inserted. SEEK IMMEDIATE MEDICAL CARE IF:   You develop a fever.  You have pain that gets worse even though you are taking pain medicine.  The area is red and warm, and you have trouble moving the joint. MAKE SURE YOU:   Understand these instructions.  Will watch your condition.  Will get help right away if you are not doing well or get worse. Document Released: 12/06/2006 Document Revised: 12/07/2011 Document Reviewed: 09/02/2007 [facility] Patient Information 2015 [place], [place]. This information is not intended to replace advice given to you by your health care provider. Make sure you discuss any questions you have with your health care provider.

## 2015-01-28 NOTE — Progress Notes (Signed)
Subjective:    Patient ID: Elizabeth Diaz, female    DOB: 04-28-1966, 49 y.o.   MRN: [id number]  HPI  Elizabeth Diaz is here for knee pain and dizziness .  EXTREMITY PAIN  Location: Both knees  Pain started: always hurt but now is everyday, constant  Pain is: sharp Severity: 6-7/10 Medications tried: Tried naproxen and tramadol with no improvement  Recent trauma: sometime her left leg gives out. No locking  Similar pain previously: no   Symptoms Redness:no Swelling:no Fever: no Weakness: feels generalized fatigue  Weight loss: no Rash: no  Dizziness: lightheadedness that started recently. Occuring in the morning or when she is sitting. Occurs when she is standing up. No reports of room spinning. Just taking tramadol for her pain.    Current Outpatient Prescriptions on File Prior to Visit  Medication Sig Dispense Refill  . cyclobenzaprine (FLEXERIL) 10 MG tablet Take 0.5-1 tablets (5-10 mg total) by mouth 3 (three) times daily as needed for muscle spasms. 60 tablet 0  . gabapentin (NEURONTIN) 100 MG capsule Take 1 capsule (100 mg total) by mouth 3 (three) times daily. 90 capsule 0  . ibuprofen (ADVIL,MOTRIN) 200 MG tablet Take 400-800 mg by mouth every 6 (six) hours as needed for moderate pain.    . naproxen (NAPROSYN) 500 MG tablet Take 1 tablet (500 mg total) by mouth 2 (two) times daily with a meal. For 5 days, then take as needed for pain. 30 tablet 0  . traMADol (ULTRAM) 50 MG tablet Take 1 tablet (50 mg total) by mouth every 6 (six) hours as needed. 15 tablet 0   No current facility-administered medications on file prior to visit.    SHx: drink EtOH occasionally   Health Maintenance: Needs pap smear   Review of Systems See HPI     Objective:   Physical Exam Temp(Src) 98.8 F (37.1 C) (Oral)  Ht 5\' 1"  (1.549 m)  Wt 259 lb (117.482 kg)  BMI 48.96 kg/m2  LMP 12/25/2014 Gen: NAD, alert, cooperative with exam, obese  CV: regular rate  Pulm: non labored    Neuro: CN 2-12 intact MSK: no TTP on greater trochanter b/l, +2 DTR's in patellar b/l, normal plantar and dorsiflexion, normal grip strength  Knee Exam:  Laterality: right Appearance: no erythema, ecchymosis  Edema: no   Tenderness: yes  lateral, medial joint line  Range of Motion: Passive Extension: normal Flexion:normal Active Extension: normal Flexion: normal Laxity: none Maneuvers: Lachman's: neg McMurray's: neg Patellar Compression: neg Strength:  Quadricep: 5/5 Hamstring: 5/5 Gait: normal Neurovascularly intact  Knee Exam:  Laterality: left Appearance: no erythema, ecchymosis  Edema: no   Tenderness: yes  lateral, medial joint line  Range of Motion: Passive Extension: normal Flexion:normal Active Extension: normal Flexion: normal Laxity: none Maneuvers: Lachman's: neg McMurray's: neg Patellar Compression: neg  Strength:  Quadricep: 5/5 Hamstring: 5/5 Neurovascularly intact   INJECTION: Right knee infection  Patient was given informed consent, signed copy in the chart. Appropriate time out was taken. Area prepped and draped in usual sterile fashion. One cc of methylprednisolone 40 mg/ml plus four cc of 1% lidocaine without epinephrine was injected into the right knee joint using a(n) perpendicular infralateral approach. The patient tolerated the procedure well. There were no complications. Post procedure instructions were given.   INJECTION: Left knee injection  Patient was given informed consent, signed copy in the chart. Appropriate time out was taken. Area prepped and draped in usual sterile fashion. One cc of methylprednisolone  40 mg/ml plus four cc of 1% lidocaine without epinephrine was injected into the left knee joint using a(n) perpendicular infralateral approach. The patient tolerated the procedure well. There were no complications. Post procedure instructions were given.      Assessment & Plan:

## 2015-01-28 NOTE — Assessment & Plan Note (Signed)
Unsure of cause. Could be associated with BPPV or 2/2 postural changes. Neuro exam WNL. - will continue to monitor

## 2015-01-29 ENCOUNTER — Other Ambulatory Visit: Payer: Self-pay

## 2015-01-29 DIAGNOSIS — Z1231 Encounter for screening mammogram for malignant neoplasm of breast: Secondary | ICD-10-CM

## 2015-02-04 ENCOUNTER — Ambulatory Visit
Admission: RE | Admit: 2015-02-04 | Discharge: 2015-02-04 | Disposition: A | Discharge status: Home / Self Care | Payer: BLUE CROSS/BLUE SHIELD | Source: Ambulatory Visit

## 2015-02-04 DIAGNOSIS — Z1231 Encounter for screening mammogram for malignant neoplasm of breast: Secondary | ICD-10-CM

## 2015-02-12 ENCOUNTER — Encounter: Payer: Self-pay | Admitting: Family Medicine

## 2015-02-12 ENCOUNTER — Ambulatory Visit (INDEPENDENT_AMBULATORY_CARE_PROVIDER_SITE_OTHER): Payer: BLUE CROSS/BLUE SHIELD | Admitting: Family Medicine

## 2015-02-12 VITALS — BP 133/71 | HR 88 | Temp 98.2°F | Ht 59.75 in | Wt 264.6 lb

## 2015-02-12 DIAGNOSIS — R202 Paresthesia of skin: Secondary | ICD-10-CM | POA: Diagnosis not present

## 2015-02-12 DIAGNOSIS — R200 Anesthesia of skin: Secondary | ICD-10-CM

## 2015-02-12 DIAGNOSIS — M25561 Pain in right knee: Secondary | ICD-10-CM

## 2015-02-12 DIAGNOSIS — M25562 Pain in left knee: Secondary | ICD-10-CM

## 2015-02-12 MED ORDER — MELOXICAM 7.5 MG PO TABS: 7.5000 mg | ORAL_TABLET | Freq: Every day | ORAL | Status: DC

## 2015-02-12 MED ORDER — TRAMADOL HCL 50 MG PO TABS: 50.0000 mg | ORAL_TABLET | Freq: Four times a day (QID) | ORAL | Status: DC | PRN

## 2015-02-12 MED ORDER — GABAPENTIN 100 MG PO CAPS: 100.0000 mg | ORAL_CAPSULE | Freq: Three times a day (TID) | ORAL | Status: DC

## 2015-02-12 NOTE — Progress Notes (Signed)
   Subjective:    Patient ID: Elizabeth Diaz, female    DOB: 1966-04-01, 49 y.o.   MRN: [id number]  HPI  Elizabeth Diaz is here for b/l knee pain.   Knee pain: recently received injections in both knees. She had mild improvement for two days then the pain came back. She is standing at her work and is exacerbating her pain. Denies any trauma or injury in there interim. She denies any fever, chills or weight loss. No imaging to review upon chart review.    Current Outpatient Prescriptions on File Prior to Visit  Medication Sig Dispense Refill  . cyclobenzaprine (FLEXERIL) 10 MG tablet Take 0.5-1 tablets (5-10 mg total) by mouth 3 (three) times daily as needed for muscle spasms. 60 tablet 0  . ibuprofen (ADVIL,MOTRIN) 200 MG tablet Take 400-800 mg by mouth every 6 (six) hours as needed for moderate pain.    . naproxen (NAPROSYN) 500 MG tablet Take 1 tablet (500 mg total) by mouth 2 (two) times daily with a meal. For 5 days, then take as needed for pain. 30 tablet 0  . traMADol (ULTRAM) 50 MG tablet Take 1 tablet (50 mg total) by mouth every 6 (six) hours as needed. 15 tablet 0   No current facility-administered medications on file prior to visit.   Review of Systems See HPI     Objective:   Physical Exam BP 133/71 mmHg  Pulse 88  Temp(Src) 98.2 F (36.8 C) (Oral)  Ht 4' 11.75" (1.518 m)  Wt 264 lb 9 oz (120.005 kg)  BMI 52.08 kg/m2  LMP 01/28/2015 (Approximate) Gen: NAD, alert, cooperative with exam, morbid obese  Skin: no rashes, normal turgor  Neuro: no gross deficits.  MSK: no erythema, ecchymosis of b/l LE. No deformity or edema of b/l LE.  TTP on medial and lateral joint line of b/l knee  No laxity Normal ROM b/l  5/5 strength with knee flexion and extension b/l  +2 DTR's patellar b/l  Normal plantar and dorsiflexion b/l  Neurovascularly intact  Normal McMurray's  Normal gait      Assessment & Plan:

## 2015-02-12 NOTE — Patient Instructions (Signed)
Thank you for coming in,   I will start mobic and gabapentin. Please let me know if there is any improvement in your pain.   I will also make a referral to Physical therapy.   Please bring all of your medications with you to each visit.    Please feel free to call with any questions or concerns at any time, at [phone]. --Dr. Raeford Razor

## 2015-02-13 ENCOUNTER — Encounter: Payer: Self-pay | Admitting: Family Medicine

## 2015-02-13 NOTE — Assessment & Plan Note (Addendum)
Pain still present. Stemming from her Obesity and deconditioning.  - stop narpoxen, start mobic  - start gabapentin  - refilled tramadol PRN  - referral to PT  - if no improvement consider imaging and referral to sports med  - may benefit from referral to nutritionist

## 2015-02-26 ENCOUNTER — Ambulatory Visit: Payer: BLUE CROSS/BLUE SHIELD | Admitting: Internal Medicine

## 2015-02-27 ENCOUNTER — Ambulatory Visit: Payer: BLUE CROSS/BLUE SHIELD | Attending: Family Medicine | Admitting: Physical Therapy

## 2015-02-27 DIAGNOSIS — R269 Unspecified abnormalities of gait and mobility: Secondary | ICD-10-CM

## 2015-02-27 DIAGNOSIS — M25562 Pain in left knee: Secondary | ICD-10-CM | POA: Insufficient documentation

## 2015-02-27 DIAGNOSIS — M25561 Pain in right knee: Secondary | ICD-10-CM

## 2015-02-27 DIAGNOSIS — R293 Abnormal posture: Secondary | ICD-10-CM | POA: Diagnosis not present

## 2015-02-27 DIAGNOSIS — R29898 Other symptoms and signs involving the musculoskeletal system: Secondary | ICD-10-CM

## 2015-02-27 NOTE — Patient Instructions (Signed)
   Kristoffer Leamon PT, DPT, LAT, ATC  [facility] Phone: [phone]     

## 2015-02-27 NOTE — Therapy (Signed)
[place], [place], [address] Phone: [phone]   Fax:  [phone]  Physical Therapy Evaluation  Patient Details  Name: Elizabeth Diaz MRN: [id number] Date of Birth: 29-Mar-1966 Referring Provider:  Rosemarie Ax, MD  Encounter Date: 02/27/2015      PT End of Session - 02/27/15 1735    Visit Number 1   Number of Visits 16   Date for PT Re-Evaluation 04/24/15   PT Start Time 1630   PT Stop Time 0254   PT Time Calculation (min) 68 min   Activity Tolerance Patient limited by pain;Patient limited by fatigue   Behavior During Therapy [facility] for tasks assessed/performed      Past Medical History  Diagnosis Date  . Pneumothorax   . Arthritis   . Seizures     hx of, no seizures last few years  . Numbness     left leg  . Hyperplastic colon polyp   . Diverticulosis     Past Surgical History  Procedure Laterality Date  . Chest tube insertion    . Tubal ligation    . Colonoscopy with propofol N/A 11/01/2014    Procedure: COLONOSCOPY WITH PROPOFOL;  Surgeon: Jerene Bears, MD;  Location: [facility];  Service: Gastroenterology;  Laterality: N/A;    There were no vitals filed for this visit.  Visit Diagnosis:  Bilateral knee pain - Plan: PT plan of care cert/re-cert  Leg weakness, bilateral - Plan: PT plan of care cert/re-cert  Abnormality of gait - Plan: PT plan of care cert/re-cert  Posture abnormality - Plan: PT plan of care cert/re-cert      Subjective Assessment - 02/27/15 1635    Subjective pt is a 49 y.o F with CC of bil knee pain that has been going on for a lont time with the L>R but the R one is gradually getting worse.    Limitations Sitting;Standing;Walking;House hold activities;Lifting   How long can you sit comfortably? 5-10 min   How long can you stand comfortably? extreme difficulty unable to do comforably   How long can you walk comfortably? extreme difficulty unable to do  comforably   Diagnostic tests last time was in 2009: impression was unremarkable.    Patient Stated Goals to relieve some of the pain and pressure   Currently in Pain? Yes   Pain Score 9    Pain Location Knee   Pain Orientation Right;Left   Pain Descriptors / Indicators Aching;Other (Comment);Sharp;Constant  L burns   Pain Type Chronic pain   Pain Onset More than a month ago   Pain Frequency Constant   Aggravating Factors  walking, standing, steps, transitions sit <> stand   Pain Relieving Factors laying down, tramadol, drinking   Effect of Pain on Daily Activities limited endurance, and weakness            [facility] PT Assessment - 02/27/15 1644    Assessment   Medical Diagnosis bil knee pain   Onset Date/Surgical Date --  being going on for a while   Hand Dominance Right   Next MD Visit --  02/28/2015   Prior Therapy yes   Precautions   Precautions None   Restrictions   Weight Bearing Restrictions No   Balance Screen   Has the patient fallen in the past 6 months Yes   How many times? 1   Has the patient had a decrease in activity level because of a fear of falling?  Yes   Is the patient reluctant to leave their home because of a fear of falling?  No   Home Environment   Living Environment Private residence   Living Arrangements Alone   Type of [place] to enter   Entrance Stairs-Number of Steps 5   Entrance Stairs-Rails Left   Home Layout One level   Prior Function   Level of Independence Independent;Independent with household mobility without device;Independent with community mobility without device;Independent with homemaking with ambulation;Independent with gait;Independent with transfers   Cognition   Overall Cognitive Status Within Functional Limits for tasks assessed   Observation/Other Assessments   Focus on Therapeutic Outcomes (FOTO)  72% limitation  predicted 50%   Posture/Postural Control   Posture/Postural Control Postural  limitations   Postural Limitations Rounded Shoulders;Forward head;Decreased lumbar lordosis   ROM / Strength   AROM / PROM / Strength AROM;PROM;Strength   AROM   AROM Assessment Site Knee   Right/Left Knee Right;Left   Right Knee Extension -3  extreme difficulty getting into position   Right Knee Flexion 90  extreme difficulty getting into position   Left Knee Extension 118  pain during movement   Left Knee Flexion 0   PROM   PROM Assessment Site Knee   Right/Left Knee Right;Left   Left Knee Extension --  right knee   Strength   Overall Strength Unable to assess  due to increased pain during assessment   Strength Assessment Site Knee   Right/Left Knee Left;Right   Palpation   Patella mobility Hypomobile with pain during palpation and movement, during palpation of the R knee pt exhibit incresaed fear of pain pushing the PT's hand away.    Palpation comment tenderness located at the lateral aspect of the Left knee and ant aspect of the the R knee   Ambulation/Gait   Ambulation/Gait Yes   Gait Pattern Step-to pattern;Decreased step length - right;Decreased step length - left;Decreased stance time - right;Decreased stance time - left;Wide base of support;Antalgic;Shuffle;Abducted- right;Abducted - left   Ambulation Surface Level                           PT Education - 02/27/15 1735    Education provided Yes   Education Details evaluation findings, POC, Goals, HEP   Person(s) Educated Patient   Methods Explanation   Comprehension Verbalized understanding          PT Short Term Goals - 02/27/15 1757    PT SHORT TERM GOAL #1   Title pt will be I with basic HEP (03/29/2015)   Time 4   Period Weeks   Status New   PT SHORT TERM GOAL #2   Title pt will increase bil knee AROM by > 10 degrees to assist with funcitonal exercise progression (03/29/2015)   Time 4   Period Weeks   Status New   PT SHORT TERM GOAL #3   Title she will increase bil knee strength to  >4-/5 with < 6/10 pain to help promote functional strength and endurance (03/29/2015)   Time 4   Period Weeks   Status New   PT SHORT TERM GOAL #4   Title She will be able to verbalize and demonsrate techniques to reduce bil knee inflammation via RICE method (03/29/2015)   Time 4   Period Weeks   Status New   PT SHORT TERM GOAL #5   Title She will increase her FOTO  score by > 5 points to assist with increaed functional capacity (03/29/2015)   Time 4   Period Weeks   Status New           PT Long Term Goals - 02/27/15 1759    PT LONG TERM GOAL #1   Title pt will be I with advanced HEP progression as of last exercises given upon discharge (04/29/2015)   Time 8   Period Weeks   Status New   PT LONG TERM GOAL #2   Title pt will be able to tolerated standing/walking for > 10 min with LRAD for safety to assist with funcitonal community ambulation. (04/29/2015)   Time 8   Period Weeks   Status New   PT LONG TERM GOAL #3   Title She will increase bil knee AROM to [facility] to assist with gait efficicney and safety to progress (04/29/2015)   Time 8   Period Weeks   Status New   PT LONG TERM GOAL #4   Title She will Increase bil knee strength to > 4/5 to help with safety during weight bearing activities and funcitonal gait/edurance (04/29/2015)   Time 8   Period Weeks   Status New   PT LONG TERM GOAL #5   Title She will increase her FOTO score to > 50  to assist with increased functional capacity upon discharge (04/29/2015)   Time [place] - 02/27/15 1750    Clinical Impression Statement Malaia presents to OPPT with report of bil knee pain that has been going on for a while with the R>L. She demonstrates limited AROM/PROM secondary to increased pain that occurs during and at endrange. MMT was deferred due to the pain level during AROM assessment. She currently ambulates with an antalgic step to gait pattern with limited step lenght bil reqirig HHA from  the wall or the PT. Limited evaluation due to the amount of pain the pt was in. performed E-stim and heat which she reported felt good but didn't last when she began walking.  PT discussed the benefit of an AD such as a RW to alleviate pressure of the knees while walking as well as providing safety, as well as aquatic exercises to help promote improved movement with decreased pain. Plan to submit request for RW. She would benefit from skilled physical therapy to maximize her function and decrease her pain by addressing the impairments listed.    Pt will benefit from skilled therapeutic intervention in order to improve on the following deficits Decreased balance;Decreased activity tolerance;Decreased strength;Decreased mobility;Increased edema;Decreased endurance;Decreased range of motion;Abnormal gait;Obesity;Pain;Impaired tone;Increased fascial restricitons;Difficulty walking;Postural dysfunction;Improper body mechanics;Increased muscle spasms;Decreased safety awareness;Hypomobility   Rehab Potential Fair   PT Frequency 2x / week   PT Duration 8 weeks   PT Treatment/Interventions ADLs/Self Care Home Management;Cryotherapy;Electrical Stimulation;Iontophoresis 4mg /ml Dexamethasone;Moist Heat;Ultrasound;Gait training;Stair training;Neuromuscular re-education;Balance training;Therapeutic exercise;Therapeutic activities;Patient/family education;Dry needling;Taping;Vasopneumatic Device;Manual techniques;Passive range of motion   PT Next Visit Plan assess response to HEP, modalities PRN, knee mobility and strengthening, try to get prescription for RW, 10 MWT?   PT Home Exercise Plan see HEP handout   Consulted and Agree with Plan of Care Patient         Problem List Patient Active Problem List   Diagnosis Date Noted  . Dizziness 01/28/2015  . Knee pain 12/07/2014  . Change in bowel habits   . Benign neoplasm  of rectum   . Frequent loose stools   . Right-sided back pain 05/14/2014  . Fecal  incontinence 05/06/2014   Starr [place] PT, DPT, LAT, ATC  02/27/2015  6:09 PM     [facility] [address] [place], [place], [address] Phone: [phone]   Fax:  [phone]

## 2015-02-28 ENCOUNTER — Encounter: Payer: BLUE CROSS/BLUE SHIELD | Admitting: Family Medicine

## 2015-03-04 ENCOUNTER — Ambulatory Visit: Payer: BLUE CROSS/BLUE SHIELD | Admitting: Physical Therapy

## 2015-03-04 DIAGNOSIS — M25561 Pain in right knee: Secondary | ICD-10-CM | POA: Diagnosis not present

## 2015-03-04 DIAGNOSIS — R269 Unspecified abnormalities of gait and mobility: Secondary | ICD-10-CM

## 2015-03-04 DIAGNOSIS — R293 Abnormal posture: Secondary | ICD-10-CM

## 2015-03-04 DIAGNOSIS — R29898 Other symptoms and signs involving the musculoskeletal system: Secondary | ICD-10-CM

## 2015-03-04 DIAGNOSIS — M25562 Pain in left knee: Principal | ICD-10-CM

## 2015-03-04 NOTE — Therapy (Signed)
[place], [place], [address] Phone: [phone]   Fax:  [phone]  Physical Therapy Treatment  Patient Details  Name: Elizabeth Diaz MRN: [id number] Date of Birth: [birth year] Referring Provider:  Rosemarie Ax, MD  Encounter Date: 03/04/2015      PT End of Session - 03/04/15 1723    Visit Number 2   Number of Visits 16   Date for PT Re-Evaluation 04/24/15   PT Start Time 1630   PT Stop Time 1720   PT Time Calculation (min) 50 min   Activity Tolerance Patient tolerated treatment well   Behavior During Therapy [facility] for tasks assessed/performed      Past Medical History  Diagnosis Date  . Pneumothorax   . Arthritis   . Seizures     hx of, no seizures last few years  . Numbness     left leg  . Hyperplastic colon polyp   . Diverticulosis     Past Surgical History  Procedure Laterality Date  . Chest tube insertion    . Tubal ligation    . Colonoscopy with propofol N/A 11/01/2014    Procedure: COLONOSCOPY WITH PROPOFOL;  Surgeon: Elizabeth Bears, MD;  Location: [facility];  Service: Gastroenterology;  Laterality: N/A;    There were no vitals filed for this visit.  Visit Diagnosis:  Bilateral knee pain  Leg weakness, bilateral  Abnormality of gait  Posture abnormality      Subjective Assessment - 03/04/15 1643    Subjective "I am still feeling sore, in the knee. I am iced it and can tell that it has been decreasing in swelling"   Currently in Pain? Yes   Pain Score 8    Pain Location Knee   Pain Orientation Right;Left   Pain Descriptors / Indicators Aching   Pain Type Chronic pain   Pain Onset More than a month ago   Pain Frequency Constant            [facility] PT Assessment - 03/04/15 0001    Standardized Balance Assessment   10 Meter Walk 30 sec  .33 m/s                     [facility] Adult PT Treatment/Exercise - 03/04/15 0001    Knee/Hip Exercises: Supine   Quad Sets AROM;Strengthening;Both;2 sets;10 reps  with e-stim for pain relief   Short Arc Quad Sets AROM;Strengthening;Both;2 sets;10 reps   Modalities   Modalities Moist Heat;Electrical Stimulation   Moist Heat Therapy   Number Minutes Moist Heat 15 Minutes   Moist Heat Location Knee  bil   Electrical Stimulation   Electrical Stimulation Location R ant knee   Electrical Stimulation Action IFC   Electrical Stimulation Parameters 100% scan x 15 min to tolerance   Electrical Stimulation Goals Pain   Manual Therapy   Manual Therapy Joint mobilization;Soft tissue mobilization;Taping   Joint Mobilization grade 1-2 knee mobilization P<>A and patellar mobilizations   Soft tissue mobilization STM over the bil quads   McConnell for the R knee, medial mcconnell tape                  PT Short Term Goals - 02/27/15 1757    PT SHORT TERM GOAL #1   Title pt will be I with basic HEP (03/29/2015)   Time 4   Period Weeks   Status New   PT SHORT TERM GOAL #2   Title pt  will increase bil knee AROM by > 10 degrees to assist with funcitonal exercise progression (03/29/2015)   Time 4   Period Weeks   Status New   PT SHORT TERM GOAL #3   Title she will increase bil knee strength to >4-/5 with < 6/10 pain to help promote functional strength and endurance (03/29/2015)   Time 4   Period Weeks   Status New   PT SHORT TERM GOAL #4   Title She will be able to verbalize and demonsrate techniques to reduce bil knee inflammation via RICE method (03/29/2015)   Time 4   Period Weeks   Status New   PT SHORT TERM GOAL #5   Title She will increase her FOTO score by > 5 points to assist with increaed functional capacity (03/29/2015)   Time 4   Period Weeks   Status New           PT Long Term Goals - 02/27/15 1759    PT LONG TERM GOAL #1   Title pt will be I with advanced HEP progression as of last exercises given upon discharge (04/29/2015)   Time 8   Period Weeks   Status New   PT LONG TERM GOAL  #2   Title pt will be able to tolerated standing/walking for > 10 min with LRAD for safety to assist with funcitonal community ambulation. (04/29/2015)   Time 8   Period Weeks   Status New   PT LONG TERM GOAL #3   Title She will increase bil knee AROM to [facility] to assist with gait efficicney and safety to progress (04/29/2015)   Time 8   Period Weeks   Status New   PT LONG TERM GOAL #4   Title She will Increase bil knee strength to > 4/5 to help with safety during weight bearing activities and funcitonal gait/edurance (04/29/2015)   Time 8   Period Weeks   Status New   PT LONG TERM GOAL #5   Title She will increase her FOTO score to > 50  to assist with increased functional capacity upon discharge (04/29/2015)   Time 8   Period Weeks   Status New               Plan - 03/04/15 1724    Clinical Impression Statement Elizabeth Diaz continues to demonstrate increased pain in the bil knees with R>L. 10 MWT was assessed and she scored .33 m/s which is below community ambulaiton for safety.  PT sent a referral request to get an AD to help allieviate some pressure on her knees and am currently waiting to hear back from the [facility]. she continues to report increased pain during any and all strengthening and mobilization activities.  She may benefit from getting an orthopedic consult due to the severity of her symptoms including after heat and e-stim.    PT Next Visit Plan assess response to mcConnell tapeing, modalities PRN, knee/ patellar mobility and strengthening,    Consulted and Agree with Plan of Care Patient        Problem List Patient Active Problem List   Diagnosis Date Noted  . Dizziness 01/28/2015  . Knee pain 12/07/2014  . Change in bowel habits   . Benign neoplasm of rectum   . Frequent loose stools   . Right-sided back pain 05/14/2014  . Fecal incontinence 05/06/2014   Elizabeth Diaz PT, DPT, LAT, ATC  03/04/2015  5:31 PM    [facility] [address]  [place], [place], [address] Phone: [phone]   Fax:  [phone]

## 2015-03-07 ENCOUNTER — Ambulatory Visit: Payer: BLUE CROSS/BLUE SHIELD | Admitting: Physical Therapy

## 2015-03-11 NOTE — Progress Notes (Signed)
Patient ID: Elizabeth Diaz, female   DOB: [birth year], 49 y.o.   MRN: [id number]  The patient's chart has been reviewed by Dr. Hilarie Fredrickson  and the recommendations are noted below.  Follow-up advised. Schedule patient for next available appointment.

## 2015-03-12 ENCOUNTER — Ambulatory Visit: Payer: BLUE CROSS/BLUE SHIELD | Admitting: Physical Therapy

## 2015-03-12 ENCOUNTER — Other Ambulatory Visit: Payer: Self-pay | Admitting: Family Medicine

## 2015-03-12 DIAGNOSIS — R293 Abnormal posture: Secondary | ICD-10-CM

## 2015-03-12 DIAGNOSIS — M25561 Pain in right knee: Secondary | ICD-10-CM

## 2015-03-12 DIAGNOSIS — M25562 Pain in left knee: Principal | ICD-10-CM

## 2015-03-12 DIAGNOSIS — R269 Unspecified abnormalities of gait and mobility: Secondary | ICD-10-CM

## 2015-03-12 DIAGNOSIS — R29898 Other symptoms and signs involving the musculoskeletal system: Secondary | ICD-10-CM

## 2015-03-12 NOTE — Patient Instructions (Signed)
   Copyright  VHI. All rights reserved.  HIP: Flexion / KNEE: Extension, Straight Leg Raise   Raise leg, keeping knee straight. Perform slowly. _10__ reps per set, _1-2__ sets per day, _7__ days per week   Copyright  VHI. All rights reserved.  Heel Slide   Bend knee and pull heel toward buttocks. Hold __5__ seconds. Return. Repeat with other knee. Repeat _10___ times. Do _2___ sessions per day.  http://[email or web address].exer.us/372   Copyright  VHI. All rights reserved.     Raise leg until knee is straight. __10_ reps per set, _2__ sets per day, _7__ days per week  Copyright  VHI. All rights reserved.  Short Arc [employer] a large can or rolled towel under leg. Straighten knee and leg. Hold ____ seconds. Repeat with other leg. Repeat ____ times. Do ____ sessions per day.  http://gt2.exer.us/365   Copyright  VHI. All rights reserved.  Quad Set   Slowly tighten muscles on thigh of straight leg while counting out loud to __5__. Repeat with other leg. Repeat _10-20___ times. Do _2___ sessions per day.  http://gt2.exer.us/361   Copyright  VHI. All rights reserved.

## 2015-03-12 NOTE — Telephone Encounter (Signed)
Needs refill on tramadol She realllllllly needs it and wants it as soon as possible She wants it called in to the [facility] [address] She would like to have it refilled within the next hour

## 2015-03-12 NOTE — Therapy (Signed)
[place], [place], [address] Phone: [phone]   Fax:  [phone]  Physical Therapy Treatment  Patient Details  Name: Elizabeth Diaz MRN: [id number] Date of Birth: 1966/04/22 Referring Provider:  Rosemarie Ax, MD  Encounter Date: 03/12/2015      PT End of Session - 03/12/15 1719    Visit Number 3   Number of Visits 16   Date for PT Re-Evaluation 04/24/15   PT Start Time 0430   PT Stop Time 0518   PT Time Calculation (min) 48 min      Past Medical History  Diagnosis Date  . Pneumothorax   . Arthritis   . Seizures     hx of, no seizures last few years  . Numbness     left leg  . Hyperplastic colon polyp   . Diverticulosis     Past Surgical History  Procedure Laterality Date  . Chest tube insertion    . Tubal ligation    . Colonoscopy with propofol N/A 11/01/2014    Procedure: COLONOSCOPY WITH PROPOFOL;  Surgeon: Jerene Bears, MD;  [facility];  Service: Gastroenterology;  Laterality: N/A;    There were no vitals filed for this visit.  Visit Diagnosis:  Bilateral knee pain  Leg weakness, bilateral  Abnormality of gait  Posture abnormality          [facility] PT Assessment - 03/12/15 1711    AROM   Right Knee Flexion 95                             PT Education - 03/12/15 1712    Education provided Yes   Education Details HEP knee level 1   Person(s) Educated Patient   Methods Explanation;Handout   Comprehension Verbalized understanding          PT Short Term Goals - 02/27/15 1757    PT SHORT TERM GOAL #1   Title pt will be I with basic HEP (03/29/2015)   Time 4   Period Weeks   Status New   PT SHORT TERM GOAL #2   Title pt will increase bil knee AROM by > 10 degrees to assist with funcitonal exercise progression (03/29/2015)   Time 4   Period Weeks   Status New   PT SHORT TERM GOAL #3   Title she will increase bil knee strength  to >4-/5 with < 6/10 pain to help promote functional strength and endurance (03/29/2015)   Time 4   Period Weeks   Status New   PT SHORT TERM GOAL #4   Title She will be able to verbalize and demonsrate techniques to reduce bil knee inflammation via RICE method (03/29/2015)   Time 4   Period Weeks   Status New   PT SHORT TERM GOAL #5   Title She will increase her FOTO score by > 5 points to assist with increaed functional capacity (03/29/2015)   Time 4   Period Weeks   Status New           PT Long Term Goals - 02/27/15 1759    PT LONG TERM GOAL #1   Title pt will be I with advanced HEP progression as of last exercises given upon discharge (04/29/2015)   Time 8   Period Weeks   Status New   PT LONG TERM GOAL #2   Title pt will be able to tolerated  standing/walking for > 10 min with LRAD for safety to assist with funcitonal community ambulation. (04/29/2015)   Time 8   Period Weeks   Status New   PT LONG TERM GOAL #3   Title She will increase bil knee AROM to [facility] to assist with gait efficicney and safety to progress (04/29/2015)   Time 8   Period Weeks   Status New   PT LONG TERM GOAL #4   Title She will Increase bil knee strength to > 4/5 to help with safety during weight bearing activities and funcitonal gait/edurance (04/29/2015)   Time 8   Period Weeks   Status New   PT LONG TERM GOAL #5   Title She will increase her FOTO score to > 50  to assist with increased functional capacity upon discharge (04/29/2015)   Time 8   Period Weeks   Status New               Plan - 03/12/15 1720    Clinical Impression Statement Pt continues to rate 10/10 pain with right knee and 5/10 left knee. Issued pt RW today to decrease weight bearing through RLE. Pt reports pain is so bad already she cannot really tell if it is helping. Pt able to perform level one knee strengthening exercises during IFC and HMP. Issued for HEP as tolerated.    PT Next Visit Plan review HEP, try different tape? ionto  order signed? see previous plan        Problem List Patient Active Problem List   Diagnosis Date Noted  . Dizziness 01/28/2015  . Knee pain 12/07/2014  . Change in bowel habits   . Benign neoplasm of rectum   . Frequent loose stools   . Right-sided back pain 05/14/2014  . Fecal incontinence 05/06/2014    Dorene Ar, PTA 03/12/2015, 5:25 PM  [facility] [address] [place], [place], [address] Phone: [phone]   Fax:  [phone]

## 2015-03-13 ENCOUNTER — Telehealth: Payer: Self-pay | Admitting: Family Medicine

## 2015-03-13 NOTE — Telephone Encounter (Signed)
Form placed in PCP box. Zimmerman Rumple, Waylon Koffler D, CMA  

## 2015-03-13 NOTE — Telephone Encounter (Signed)
Patient dropped off a referral form to be filled out by the doctor.  Please call when completed.

## 2015-03-13 NOTE — Telephone Encounter (Signed)
[place] to call in tramadol 50 mg Q6 PRN #30.   Rosemarie Ax, MD PGY-2, [facility] 03/13/2015, 12:01 PM

## 2015-03-14 ENCOUNTER — Ambulatory Visit: Payer: BLUE CROSS/BLUE SHIELD | Admitting: Physical Therapy

## 2015-03-14 DIAGNOSIS — M25561 Pain in right knee: Secondary | ICD-10-CM

## 2015-03-14 DIAGNOSIS — R29898 Other symptoms and signs involving the musculoskeletal system: Secondary | ICD-10-CM

## 2015-03-14 DIAGNOSIS — R269 Unspecified abnormalities of gait and mobility: Secondary | ICD-10-CM

## 2015-03-14 DIAGNOSIS — R293 Abnormal posture: Secondary | ICD-10-CM

## 2015-03-14 DIAGNOSIS — M25562 Pain in left knee: Principal | ICD-10-CM

## 2015-03-14 NOTE — Therapy (Signed)
[place], [place], [address] Phone: [phone]   Fax:  [phone]  Physical Therapy Treatment  Patient Details  Name: Elizabeth Diaz MRN: [id number] Date of Birth: [birth year] Referring Provider:  Rosemarie Ax, MD  Encounter Date: 03/14/2015      PT End of Session - 03/14/15 1631    Visit Number 4   Number of Visits 16   Date for PT Re-Evaluation 04/24/15   PT Start Time 0037   PT Stop Time 1641   PT Time Calculation (min) 56 min   Activity Tolerance Patient limited by pain      Past Medical History  Diagnosis Date  . Pneumothorax   . Arthritis   . Seizures     hx of, no seizures last few years  . Numbness     left leg  . Hyperplastic colon polyp   . Diverticulosis     Past Surgical History  Procedure Laterality Date  . Chest tube insertion    . Tubal ligation    . Colonoscopy with propofol N/A 11/01/2014    Procedure: COLONOSCOPY WITH PROPOFOL;  Surgeon: Jerene Bears, MD;  Location: [facility];  Service: Gastroenterology;  Laterality: N/A;    There were no vitals filed for this visit.  Visit Diagnosis:  Bilateral knee pain  Leg weakness, bilateral  Abnormality of gait  Posture abnormality      Subjective Assessment - 03/14/15 1553    Subjective LLE stays numb all the time.  Both knees hurt today, not as bad as the other day.   Currently in Pain? Yes   Pain Score 4   Rt. 6/10 , L.t 4/10   Pain Orientation Left;Right   Pain Type Chronic pain   Pain Onset More than a month ago   Pain Frequency Constant           [facility] Adult PT Treatment/Exercise - 03/14/15 1555    Knee/Hip Exercises: Supine   Quad Sets Strengthening;Both;2 sets;10 reps   Short Arc Quad Sets Strengthening;Both;1 set;20 reps   Hip Adduction Isometric --   Knee Flexion AAROM;Both;1 set;10 reps   Moist Heat Therapy   Number Minutes Moist Heat 15 Minutes   Moist Heat Location Knee   Ultrasound    Ultrasound Location medial Rt. knee   Ultrasound Parameters 20%, 1.0, 5 min 1 MHZ   Ultrasound Goals Pain   Iontophoresis   Type of Iontophoresis Dexamethasone   Location Rt., knee   Dose 4   Time patch     Self care: practiced lifting wide object with staggered stance and alos small lighter object with single leg lift.  Able to demo with cues.  Bends back vs knees.  Educated on safety and needs to call help at work when possible.             PT Education - 03/14/15 1604    Education provided Yes   Education Details review HEP and lifting for work    [employer]) Educated Patient   Methods Explanation;Demonstration;Handout   Comprehension Verbalized understanding;Returned demonstration;Verbal cues required          PT Short Term Goals - 03/14/15 1634    PT SHORT TERM GOAL #1   Title pt will be I with basic HEP (03/29/2015)   Status Partially Met   PT SHORT TERM GOAL #2   Title pt will increase bil knee AROM by > 10 degrees to assist with funcitonal exercise progression (03/29/2015)  Status On-going   PT SHORT TERM GOAL #3   Title she will increase bil knee strength to >4-/5 with < 6/10 pain to help promote functional strength and endurance (03/29/2015)   Status On-going   PT SHORT TERM GOAL #4   Title She will be able to verbalize and demonsrate techniques to reduce bil knee inflammation via RICE method (03/29/2015)   Status On-going   PT SHORT TERM GOAL #5   Title She will increase her FOTO score by > 5 points to assist with increaed functional capacity (03/29/2015)   Status Unable to assess           PT Long Term Goals - 03/14/15 1634    PT LONG TERM GOAL #1   Title pt will be I with advanced HEP progression as of last exercises given upon discharge (04/29/2015)   Status On-going   PT LONG TERM GOAL #2   Title pt will be able to tolerated standing/walking for > 10 min with LRAD for safety to assist with funcitonal community ambulation. (04/29/2015)   Status On-going    PT LONG TERM GOAL #3   Title She will increase bil knee AROM to [facility] to assist with gait efficicney and safety to progress (04/29/2015)   Status On-going   PT LONG TERM GOAL #4   Title She will Increase bil knee strength to > 4/5 to help with safety during weight bearing activities and funcitonal gait/edurance (04/29/2015)   Status On-going   PT LONG TERM GOAL #5   Title She will increase her FOTO score to > 50  to assist with increased functional capacity upon discharge (04/29/2015)   Status Unable to assess               Plan - 03/14/15 1632    Clinical Impression Statement Patient able to tolerate ther ex today without IFC. Addressed lifting at work to reduce pain in back and knees, needed mod cueing. Reminded pt to use walker when not at work to ease pain.     PT Next Visit Plan assess [place], ionto and progress as tol   PT Home Exercise Plan see HEP handout   Consulted and Agree with Plan of Care Patient        Problem List Patient Active Problem List   Diagnosis Date Noted  . Dizziness 01/28/2015  . Knee pain 12/07/2014  . Change in bowel habits   . Benign neoplasm of rectum   . Frequent loose stools   . Right-sided back pain 05/14/2014  . Fecal incontinence 05/06/2014    PAA,JENNIFER 03/14/2015, 4:35 PM  [facility] [address] [place], [place], [address] Phone: [phone]   Fax:  [phone]  Raeford Razor, PT 03/14/2015 4:37 PM Phone: [phone] Fax: [phone]

## 2015-03-14 NOTE — Patient Instructions (Signed)
One Knee   Slide object up one thigh, and hold close at waist level with both hands before standing up.   Copyright  VHI. All rights reserved.  Lifting Principles .Maintain proper posture and head alignment. .Slide object as close as possible before lifting. .Move obstacles out of the way. .Test before lifting; ask for help if too heavy. .Tighten stomach muscles without holding breath. .Use smooth movements; do not jerk. .Use legs to do the work, and pivot with feet. .Distribute the work load symmetrically and close to the center of trunk. .Push instead of pull whenever possible.  Copyright  VHI. All rights reserved.  Deep Squat   Squat and lift with both arms held against upper trunk. Tighten stomach muscles without holding breath. Use smooth movements to avoid jerking.  Copyright  VHI. All rights reserved.  Low Shelf   Squat down, and bring item close to lift.   Copyright  VHI. All rights reserved.    

## 2015-03-18 ENCOUNTER — Encounter (HOSPITAL_COMMUNITY): Payer: Self-pay | Admitting: Family Medicine

## 2015-03-18 ENCOUNTER — Emergency Department (HOSPITAL_COMMUNITY)
Admission: EM | Admit: 2015-03-18 | Discharge: 2015-03-18 | Disposition: A | Discharge status: Home / Self Care | Payer: BLUE CROSS/BLUE SHIELD | Attending: Emergency Medicine | Admitting: Emergency Medicine

## 2015-03-18 ENCOUNTER — Ambulatory Visit: Payer: BLUE CROSS/BLUE SHIELD | Admitting: Physical Therapy

## 2015-03-18 ENCOUNTER — Emergency Department (HOSPITAL_COMMUNITY): Payer: BLUE CROSS/BLUE SHIELD

## 2015-03-18 DIAGNOSIS — R200 Anesthesia of skin: Secondary | ICD-10-CM | POA: Insufficient documentation

## 2015-03-18 DIAGNOSIS — M1990 Unspecified osteoarthritis, unspecified site: Secondary | ICD-10-CM | POA: Diagnosis not present

## 2015-03-18 DIAGNOSIS — Z87891 Personal history of nicotine dependence: Secondary | ICD-10-CM | POA: Diagnosis not present

## 2015-03-18 DIAGNOSIS — M25562 Pain in left knee: Principal | ICD-10-CM

## 2015-03-18 DIAGNOSIS — G8929 Other chronic pain: Secondary | ICD-10-CM | POA: Diagnosis not present

## 2015-03-18 DIAGNOSIS — Z8709 Personal history of other diseases of the respiratory system: Secondary | ICD-10-CM | POA: Insufficient documentation

## 2015-03-18 DIAGNOSIS — R269 Unspecified abnormalities of gait and mobility: Secondary | ICD-10-CM

## 2015-03-18 DIAGNOSIS — Z86010 Personal history of colonic polyps: Secondary | ICD-10-CM | POA: Diagnosis not present

## 2015-03-18 DIAGNOSIS — M25561 Pain in right knee: Secondary | ICD-10-CM

## 2015-03-18 DIAGNOSIS — R293 Abnormal posture: Secondary | ICD-10-CM

## 2015-03-18 DIAGNOSIS — R29898 Other symptoms and signs involving the musculoskeletal system: Secondary | ICD-10-CM

## 2015-03-18 MED ORDER — LIDO-CAPSAICIN-MEN-METHYL SAL 0.5-0.035-5-20 % EX PTCH: 1.0000 | MEDICATED_PATCH | Freq: Two times a day (BID) | CUTANEOUS | Status: DC | PRN

## 2015-03-18 MED ORDER — HYDROCODONE-ACETAMINOPHEN 5-325 MG PO TABS
1.0000 | ORAL_TABLET | Freq: Once | ORAL | Status: AC
  Administered 2015-03-18: 1 via ORAL
  Filled 2015-03-18: qty 1

## 2015-03-18 NOTE — Discharge Instructions (Signed)
Read the information below.  Use the prescribed medication as directed.  Please discuss all new medications with your pharmacist.  You may return to the Emergency Department at any time for worsening condition or any new symptoms that concern you.    If you develop uncontrolled pain, weakness or numbness of the extremity, severe discoloration of the skin, or you are unable to move your knee or walk, return to the ER for a recheck.      Knee Pain The knee is the complex joint between your thigh and your lower leg. It is made up of bones, tendons, ligaments, and cartilage. The bones that make up the knee are:  The femur in the thigh.  The tibia and fibula in the lower leg.  The patella or kneecap riding in the groove on the lower femur. CAUSES  Knee pain is a common complaint with many causes. A few of these causes are:  Injury, such as:  A ruptured ligament or tendon injury.  Torn cartilage.  Medical conditions, such as:  Gout  Arthritis  Infections  Overuse, over training, or overdoing a physical activity. Knee pain can be minor or severe. Knee pain can accompany debilitating injury. Minor knee problems often respond well to self-care measures or get well on their own. More serious injuries may need medical intervention or even surgery. SYMPTOMS The knee is complex. Symptoms of knee problems can vary widely. Some of the problems are:  Pain with movement and weight bearing.  Swelling and tenderness.  Buckling of the knee.  Inability to straighten or extend your knee.  Your knee locks and you cannot straighten it.  Warmth and redness with pain and fever.  Deformity or dislocation of the kneecap. DIAGNOSIS  Determining what is wrong may be very straight forward such as when there is an injury. It can also be challenging because of the complexity of the knee. Tests to make a diagnosis may include:  Your caregiver taking a history and doing a physical exam.  Routine  X-rays can be used to rule out other problems. X-rays will not reveal a cartilage tear. Some injuries of the knee can be diagnosed by:  Arthroscopy a surgical technique by which a small video camera is inserted through tiny incisions on the sides of the knee. This procedure is used to examine and repair internal knee joint problems. Tiny instruments can be used during arthroscopy to repair the torn knee cartilage (meniscus).  Arthrography is a radiology technique. A contrast liquid is directly injected into the knee joint. Internal structures of the knee joint then become visible on X-ray film.  An MRI scan is a non X-ray radiology procedure in which magnetic fields and a computer produce two- or three-dimensional images of the inside of the knee. Cartilage tears are often visible using an MRI scanner. MRI scans have largely replaced arthrography in diagnosing cartilage tears of the knee.  Blood work.  Examination of the fluid that helps to lubricate the knee joint (synovial fluid). This is done by taking a sample out using a needle and a syringe. TREATMENT The treatment of knee problems depends on the cause. Some of these treatments are:  Depending on the injury, proper casting, splinting, surgery, or physical therapy care will be needed.  Give yourself adequate recovery time. Do not overuse your joints. If you begin to get sore during workout routines, back off. Slow down or do fewer repetitions.  For repetitive activities such as cycling or running, maintain your  strength and nutrition.  Alternate muscle groups. For example, if you are a weight lifter, work the upper body on one day and the lower body the next.  Either tight or weak muscles do not give the proper support for your knee. Tight or weak muscles do not absorb the stress placed on the knee joint. Keep the muscles surrounding the knee strong.  Take care of mechanical problems.  If you have flat feet, orthotics or special shoes  may help. See your caregiver if you need help.  Arch supports, sometimes with wedges on the inner or outer aspect of the heel, can help. These can shift pressure away from the side of the knee most bothered by osteoarthritis.  A brace called an "unloader" brace also may be used to help ease the pressure on the most arthritic side of the knee.  If your caregiver has prescribed crutches, braces, wraps or ice, use as directed. The acronym for this is PRICE. This means protection, rest, ice, compression, and elevation.  Nonsteroidal anti-inflammatory drugs (NSAIDs), can help relieve pain. But if taken immediately after an injury, they may actually increase swelling. Take NSAIDs with food in your stomach. Stop them if you develop stomach problems. Do not take these if you have a history of ulcers, stomach pain, or bleeding from the bowel. Do not take without your caregiver's approval if you have problems with fluid retention, heart failure, or kidney problems.  For ongoing knee problems, physical therapy may be helpful.  Glucosamine and chondroitin are over-the-counter dietary supplements. Both may help relieve the pain of osteoarthritis in the knee. These medicines are different from the usual anti-inflammatory drugs. Glucosamine may decrease the rate of cartilage destruction.  Injections of a corticosteroid drug into your knee joint may help reduce the symptoms of an arthritis flare-up. They may provide pain relief that lasts a few months. You may have to wait a few months between injections. The injections do have a small increased risk of infection, water retention, and elevated blood sugar levels.  Hyaluronic acid injected into damaged joints may ease pain and provide lubrication. These injections may work by reducing inflammation. A series of shots may give relief for as long as 6 months.  Topical painkillers. Applying certain ointments to your skin may help relieve the pain and stiffness of  osteoarthritis. Ask your pharmacist for suggestions. Many over the-counter products are approved for temporary relief of arthritis pain.  In some countries, doctors often prescribe topical NSAIDs for relief of chronic conditions such as arthritis and tendinitis. A review of treatment with NSAID creams found that they worked as well as oral medications but without the serious side effects. PREVENTION  Maintain a healthy weight. Extra pounds put more strain on your joints.  Get strong, stay limber. Weak muscles are a common cause of knee injuries. Stretching is important. Include flexibility exercises in your workouts.  Be smart about exercise. If you have osteoarthritis, chronic knee pain or recurring injuries, you may need to change the way you exercise. This does not mean you have to stop being active. If your knees ache after jogging or playing basketball, consider switching to swimming, water aerobics, or other low-impact activities, at least for a few days a week. Sometimes limiting high-impact activities will provide relief.  Make sure your shoes fit well. Choose footwear that is right for your sport.  Protect your knees. Use the proper gear for knee-sensitive activities. Use kneepads when playing volleyball or laying carpet. Buckle your seat  belt every time you drive. Most shattered kneecaps occur in car accidents.  Rest when you are tired. SEEK MEDICAL CARE IF:  You have knee pain that is continual and does not seem to be getting better.  SEEK IMMEDIATE MEDICAL CARE IF:  Your knee joint feels hot to the touch and you have a high fever. MAKE SURE YOU:   Understand these instructions.  Will watch your condition.  Will get help right away if you are not doing well or get worse. Document Released: 07/12/2007 Document Revised: 12/07/2011 Document Reviewed: 07/12/2007 [facility] Patient Information 2015 [place], [place]. This information is not intended to replace advice given to you by  your health care provider. Make sure you discuss any questions you have with your health care provider.

## 2015-03-18 NOTE — ED Notes (Signed)
Pt reports injury to rt knee during physical therapy. Pt reports pain 9/10.

## 2015-03-18 NOTE — ED Provider Notes (Signed)
CSN: [id number]     Arrival date & time 03/18/15  1727 History  This chart was scribed for non-physician provider Clayton Bibles, PA-C, working with Sherwood Gambler, MD by Irene Pap, ED Scribe. This patient was seen in room TR10C/TR10C and patient care was started at 7:12 PM.    Chief Complaint  Patient presents with  . Knee Pain   The history is provided by the patient. No language interpreter was used.    HPI Comments: Elizabeth Diaz is a 49 y.o. Female with a history of chronic right knee pain who presents to the Emergency Department complaining of right knee pain onset one year ago. Pt was sent from physical therapy for further evaluation. She states that her physical therapist did a lot of treatments and states that her knee is swollen. She reports that her tramadol is not working.  Pt states that the pain is sharp in the inside of her knee; states that her leg has been giving out on her. She denies any new falls or injuries. She denies leg swelling, chest pain , or SOB.  No weakness or numbness of the legs. Pt is seen at [facility] practice for bilateral chronic knee pain. Her medication was switched from naproxen to Mobic. She is also on Tramadol and was started on Gabapentin. They also gave cortisone injections. She states that she has been going to physical therapy twice a week this month; states that she has been given a walker.   Past Medical History  Diagnosis Date  . Pneumothorax   . Arthritis   . Seizures     hx of, no seizures last few years  . Numbness     left leg  . Hyperplastic colon polyp   . Diverticulosis    Past Surgical History  Procedure Laterality Date  . Chest tube insertion    . Tubal ligation    . Colonoscopy with propofol N/A 11/01/2014    Procedure: COLONOSCOPY WITH PROPOFOL;  Surgeon: Jerene Bears, MD;  Location: [facility];  Service: Gastroenterology;  Laterality: N/A;   Family History  Problem Relation Age of Onset  . Heart failure Father   .  Diabetes Maternal Grandmother   . Diabetes Paternal Grandfather   . Diabetes Father   . Cancer Maternal Grandfather     unknown type   History  Substance Use Topics  . Smoking status: Former Smoker -- 1.50 packs/day for 20 years    Types: Cigarettes    Quit date: 05/04/2009  . Smokeless tobacco: Never Used  . Alcohol Use: 0.0 oz/week    0 Standard drinks or equivalent per week     Comment: drinks liquor every night. 2-3 glasses    OB History    No data available     Review of Systems  Constitutional: Negative for fever and chills.  Respiratory: Negative for shortness of breath.   Cardiovascular: Negative for chest pain and leg swelling.  Musculoskeletal: Positive for joint swelling and arthralgias.  Skin: Negative for color change, pallor, rash and wound.  Allergic/Immunologic: Negative for immunocompromised state.  Neurological: Positive for light-headedness and numbness. Negative for weakness.  Hematological: Does not bruise/bleed easily.  Psychiatric/Behavioral: Negative for self-injury.   Allergies  Review of patient's allergies indicates no known allergies.  Home Medications   Prior to Admission medications   Medication Sig Start Date End Date Taking? Authorizing Provider  cyclobenzaprine (FLEXERIL) 10 MG tablet Take 0.5-1 tablets (5-10 mg total) by mouth 3 (three) times daily  as needed for muscle spasms. Patient not taking: Reported on 02/27/2015 08/02/14   Rosemarie Ax, MD  gabapentin (NEURONTIN) 100 MG capsule Take 1 capsule (100 mg total) by mouth 3 (three) times daily. Patient not taking: Reported on 02/27/2015 02/12/15   Rosemarie Ax, MD  ibuprofen (ADVIL,MOTRIN) 200 MG tablet Take 400-800 mg by mouth every 6 (six) hours as needed for moderate pain.    Historical Provider, MD  meloxicam (MOBIC) 7.5 MG tablet Take 1 tablet (7.5 mg total) by mouth daily. Patient not taking: Reported on 02/27/2015 02/12/15   Rosemarie Ax, MD  naproxen (NAPROSYN) 500 MG tablet  Take 1 tablet (500 mg total) by mouth 2 (two) times daily with a meal. For 5 days, then take as needed for pain. Patient not taking: Reported on 02/27/2015 12/07/14   Archie Patten, MD  traMADol (ULTRAM) 50 MG tablet Take 1 tablet (50 mg total) by mouth every 6 (six) hours as needed. 02/12/15   Rosemarie Ax, MD   BP 138/85 mmHg  Pulse 73  Temp(Src) 98.1 F (36.7 C) (Oral)  Ht 5' (1.524 m)  Wt 240 lb (108.863 kg)  BMI 46.87 kg/m2  SpO2 96%  LMP 02/20/2015   Physical Exam  Constitutional: She appears well-developed and well-nourished. No distress.  HENT:  Head: Normocephalic and atraumatic.  Neck: Neck supple.  Pulmonary/Chest: Effort normal.  Musculoskeletal:  Right knee: Tenderness inferior medially to patella; no erythema, edema or warmth; no laxity of the joint; distal pulses and sensations intact.  Neurological: She is alert.  Skin: She is not diaphoretic.  Nursing note and vitals reviewed.   ED Course  Procedures (including critical care time) DIAGNOSTIC STUDIES: Oxygen Saturation is 96% on RA, normal by my interpretation.    COORDINATION OF CARE: 7:16 PM-Discussed treatment plan which includes x-ray with pt at bedside and pt agreed to plan.   Labs Review Labs Reviewed - No data to display  Imaging Review Dg Knee Complete 4 Views Right  03/18/2015   CLINICAL DATA:  Right knee injury today during physical therapy. Right knee pain, swelling.  EXAM: RIGHT KNEE - COMPLETE 4+ VIEW  COMPARISON:  None.  FINDINGS: Mild spurring within the right knee. Mild joint space narrowing in the medial and lateral compartments. No acute bony abnormality. Specifically, no fracture, subluxation, or dislocation. Soft tissues are intact. No joint effusion.  IMPRESSION: No acute bony abnormality.   Electronically Signed   By: Rolm Baptise M.D.   On: 03/18/2015 19:48     EKG Interpretation None      MDM   Final diagnoses:  Chronic knee pain, right    Afebrile, nontoxic patient with  chronic bilateral knee pain, R>L.  She is morbidly obese and spends long hours on her feet on concrete floors.  No new injury.  Neurovascularly intact.  Xray unremarkable.    D/C home with knee sleeve, lidocaine patches, orthopedic follow up (pt had already made appointment).  Norco given in ED, no prescription for home.  Discussed result, findings, treatment, and follow up  with patient.  Pt given return precautions.  Pt verbalizes understanding and agrees with plan.       I personally performed the services described in this documentation, which was scribed in my presence. The recorded information has been reviewed and is accurate.    Clayton Bibles, PA-C 03/18/15 2102  Sherwood Gambler, MD 03/22/15 1028

## 2015-03-18 NOTE — ED Notes (Signed)
Pt here for right knee pain. sts hx of chronic right knee pain but worsening. sts she took some tramadol last night and it made her sick. sts sent here from physical therapy.

## 2015-03-18 NOTE — ED Notes (Signed)
Patient able to ambulate. Patient ambulated to restroom without any assitance.

## 2015-03-18 NOTE — Therapy (Signed)
Elizabeth Diaz, [place], [address] Phone: [phone]   Fax:  [phone]  Physical Therapy Treatment  Patient Details  Name: Elizabeth Diaz MRN: [id number] Date of Birth: [birth year] Referring Provider:  Rosemarie Ax, MD  Encounter Date: 03/18/2015      PT End of Session - 03/18/15 1724    Visit Number 5   Number of Visits 16   Date for PT Re-Evaluation 04/24/15   PT Start Time 1630   PT Stop Time 1710   PT Time Calculation (min) 40 min   Activity Tolerance Patient tolerated treatment well   Behavior During Therapy [facility] for tasks assessed/performed      Past Medical History  Diagnosis Date  . Pneumothorax   . Arthritis   . Seizures     hx of, no seizures last few years  . Numbness     left leg  . Hyperplastic colon polyp   . Diverticulosis     Past Surgical History  Procedure Laterality Date  . Chest tube insertion    . Tubal ligation    . Colonoscopy with propofol N/A 11/01/2014    Procedure: COLONOSCOPY WITH PROPOFOL;  Surgeon: Jerene Bears, MD;  Location: [facility];  Service: Gastroenterology;  Laterality: N/A;    There were no vitals filed for this visit.  Visit Diagnosis:  Bilateral knee pain  Leg weakness, bilateral  Abnormality of gait  Posture abnormality      Subjective Assessment - 03/18/15 1652    Subjective " my knees are really acting up today, and have trouble taking the pain medication it really made my stomach hurt"   Currently in Pain? Yes   Pain Score 10-Worst pain ever   Pain Location Knee   Pain Orientation Left;Right   Pain Descriptors / Indicators Aching   Pain Type Chronic pain   Pain Onset More than a month ago   Pain Frequency Constant   Aggravating Factors  weight bearing activities, movement,                          [facility] Adult PT Treatment/Exercise - 03/18/15 0001    Knee/Hip Exercises: Supine   Quad Sets  Strengthening;Both;2 sets;10 reps   Modalities   Modalities Cryotherapy   Moist Heat Therapy   Number Minutes Moist Heat 15 Minutes   Moist Heat Location Knee  Left knee   Cryotherapy   Number Minutes Cryotherapy 8 Minutes   Cryotherapy Location Knee   Type of Cryotherapy Other (comment)  ice cup massage over medial knee   Electrical Stimulation   Electrical Stimulation Location R ant knee   Electrical Stimulation Action IFC   Electrical Stimulation Parameters 15 min, 100% scan, to tolerance   Electrical Stimulation Goals Pain   Manual Therapy   Joint Mobilization grade 1 knee mobilization P<>A and patellar mobilizations  to assist with decreased pain                PT Education - 03/18/15 1722    Education provided Yes   Education Details due to pain response being high and unrelenting may need to visit the ER to help decrease pain    Person(s) Educated Patient   Methods Explanation   Comprehension Verbalized understanding          PT Short Term Goals - 03/14/15 1634    PT SHORT TERM GOAL #1   Title pt will be I  with basic HEP (03/29/2015)   Status Partially Met   PT SHORT TERM GOAL #2   Title pt will increase bil knee AROM by > 10 degrees to assist with funcitonal exercise progression (03/29/2015)   Status On-going   PT SHORT TERM GOAL #3   Title she will increase bil knee strength to >4-/5 with < 6/10 pain to help promote functional strength and endurance (03/29/2015)   Status On-going   PT SHORT TERM GOAL #4   Title She will be able to verbalize and demonsrate techniques to reduce bil knee inflammation via RICE method (03/29/2015)   Status On-going   PT SHORT TERM GOAL #5   Title She will increase her FOTO score by > 5 points to assist with increaed functional capacity (03/29/2015)   Status Unable to assess           PT Long Term Goals - 03/14/15 1634    PT LONG TERM GOAL #1   Title pt will be I with advanced HEP progression as of last exercises given upon  discharge (04/29/2015)   Status On-going   PT LONG TERM GOAL #2   Title pt will be able to tolerated standing/walking for > 10 min with LRAD for safety to assist with funcitonal community ambulation. (04/29/2015)   Status On-going   PT LONG TERM GOAL #3   Title She will increase bil knee AROM to [facility] to assist with gait efficicney and safety to progress (04/29/2015)   Status On-going   PT LONG TERM GOAL #4   Title She will Increase bil knee strength to > 4/5 to help with safety during weight bearing activities and funcitonal gait/edurance (04/29/2015)   Status On-going   PT LONG TERM GOAL #5   Title She will increase her FOTO score to > 50  to assist with increased functional capacity upon discharge (04/29/2015)   Status Unable to assess               Plan - 03/18/15 1724    Clinical Impression Statement Elizabeth Diaz presents to therapy today with report of increased pain in her R knee rated at a 10/10 that is unrelenting. Attempted to perform e-stim and ice on the R knee which did not change the pain status, mobilization grade 1 did nothing to help her pain response. Due to the nature of her pain level being so elevated and nothing help recommended that she go to the ER to see if there was anything they could do to help with relief. Called [employer] to get her transportation due to her [place] transportation. Plan to follow up with her next visit to see how things went.   PT Next Visit Plan assess [place], ionto and progress as tol. Assess trip to ER        Problem List Patient Active Problem List   Diagnosis Date Noted  . Dizziness 01/28/2015  . Knee pain 12/07/2014  . Change in bowel habits   . Benign neoplasm of rectum   . Frequent loose stools   . Right-sided back pain 05/14/2014  . Fecal incontinence 05/06/2014   Starr Lake PT, DPT, LAT, ATC  03/18/2015  5:31 PM    [facility] [address] [place], [place],  [address] Phone: [phone]   Fax:  [phone]

## 2015-03-20 ENCOUNTER — Ambulatory Visit: Payer: BLUE CROSS/BLUE SHIELD | Admitting: Physical Therapy

## 2015-03-22 NOTE — Telephone Encounter (Signed)
Form completed and placed in Tamika's box.   Rosemarie Ax, MD PGY-2, [facility] 03/22/2015, 11:28 AM

## 2015-03-22 NOTE — Telephone Encounter (Signed)
Pt informed that form was completed. Form faxed to number provided and patient will pick up the original copy.  Derl Barrow, RN

## 2015-03-25 ENCOUNTER — Ambulatory Visit: Payer: BLUE CROSS/BLUE SHIELD | Admitting: Physical Therapy

## 2015-03-25 DIAGNOSIS — R29898 Other symptoms and signs involving the musculoskeletal system: Secondary | ICD-10-CM

## 2015-03-25 DIAGNOSIS — M25562 Pain in left knee: Principal | ICD-10-CM

## 2015-03-25 DIAGNOSIS — M25561 Pain in right knee: Secondary | ICD-10-CM | POA: Diagnosis not present

## 2015-03-25 DIAGNOSIS — R293 Abnormal posture: Secondary | ICD-10-CM

## 2015-03-25 DIAGNOSIS — R269 Unspecified abnormalities of gait and mobility: Secondary | ICD-10-CM

## 2015-03-25 NOTE — Therapy (Signed)
[place], [place], [address] Phone: [phone]   Fax:  [phone]  Physical Therapy Treatment  Patient Details  Name: Elizabeth Diaz MRN: [id number] Date of Birth: 1966/05/09 Referring Provider:  Rosemarie Ax, MD  Encounter Date: 03/25/2015      PT End of Session - 03/25/15 1719    Visit Number 6   Number of Visits 16   Date for PT Re-Evaluation 04/24/15   PT Start Time 1630   PT Stop Time 0254   PT Time Calculation (min) 45 min   Activity Tolerance Patient tolerated treatment well   Behavior During Therapy [facility] for tasks assessed/performed      Past Medical History  Diagnosis Date  . Pneumothorax   . Arthritis   . Seizures     hx of, no seizures last few years  . Numbness     left leg  . Hyperplastic colon polyp   . Diverticulosis     Past Surgical History  Procedure Laterality Date  . Chest tube insertion    . Tubal ligation    . Colonoscopy with propofol N/A 11/01/2014    Procedure: COLONOSCOPY WITH PROPOFOL;  Surgeon: Jerene Bears, MD;  Location: [facility];  Service: Gastroenterology;  Laterality: N/A;    There were no vitals filed for this visit.  Visit Diagnosis:  Bilateral knee pain  Leg weakness, bilateral  Abnormality of gait  Posture abnormality      Subjective Assessment - 03/25/15 1629    Subjective "things have been going ok, I couldn't hardly walk yesterday which I believe it was due to walking alot of saturday"   Currently in Pain? Yes   Pain Score 8    Pain Location Knee   Pain Orientation Right   Pain Descriptors / Indicators Aching   Pain Type Chronic pain   Pain Onset More than a month ago   Pain Frequency Constant                         [facility] Adult PT Treatment/Exercise - 03/25/15 1637    Knee/Hip Exercises: Seated   Long Arc Quad AROM;Strengthening;Both;2 sets;10 reps   Knee/Hip Exercises: Supine   Quad Sets  Strengthening;Both;2 sets;10 reps   Short Arc [employer] AROM;Strengthening;2 sets;10 reps   Bridges AROM;Strengthening;Both;2 sets;10 reps  with bolster beneath knees   Other Supine Knee/Hip Exercises clam shells 2 x10 with red theraband.    Iontophoresis   Type of Iontophoresis Dexamethasone   Location Rt., knee   Dose 1cc   Time 6 hour stat patch   Manual Therapy   Joint Mobilization grade 1 knee mobilization P<>A and patellar mobilizations   McConnell taping to push the patella down evenly to help dispurse even pressure along underside ofthe patella.                 PT Education - 03/25/15 1718    Education provided Yes   Education Details Iontophoresis, and mcconnel taping to help dispurse even pressure on the patella   Person(s) Educated Patient   Methods Explanation   Comprehension Verbalized understanding          PT Short Term Goals - 03/25/15 1722    PT SHORT TERM GOAL #1   Title pt will be I with basic HEP (03/29/2015)   Time 4   Period Weeks   Status Achieved   PT SHORT TERM GOAL #2  Title pt will increase bil knee AROM by > 10 degrees to assist with funcitonal exercise progression (03/29/2015)   Time 4   Period Weeks   Status On-going   PT SHORT TERM GOAL #3   Title she will increase bil knee strength to >4-/5 with < 6/10 pain to help promote functional strength and endurance (03/29/2015)   Time 4   Period Weeks   Status On-going   PT SHORT TERM GOAL #4   Title She will be able to verbalize and demonsrate techniques to reduce bil knee inflammation via RICE method (03/29/2015)   Time 4   Period Weeks   Status On-going   PT SHORT TERM GOAL #5   Title She will increase her FOTO score by > 5 points to assist with increaed functional capacity (03/29/2015)   Time 4   Period Weeks   Status On-going           PT Long Term Goals - 03/25/15 1722    PT LONG TERM GOAL #1   Title pt will be I with advanced HEP progression as of last exercises given upon  discharge (04/29/2015)   Time 8   Period Weeks   Status On-going   PT LONG TERM GOAL #2   Title pt will be able to tolerated standing/walking for > 10 min with LRAD for safety to assist with funcitonal community ambulation. (04/29/2015)   Period Weeks   Status On-going   PT LONG TERM GOAL #3   Title She will increase bil knee AROM to [facility] to assist with gait efficicney and safety to progress (04/29/2015)   Time 8   Period Weeks   Status On-going   PT LONG TERM GOAL #4   Title She will Increase bil knee strength to > 4/5 to help with safety during weight bearing activities and funcitonal gait/edurance (04/29/2015)   Time 8   Period Weeks   Status On-going   PT LONG TERM GOAL #5   Title She will increase her FOTO score to > 50  to assist with increased functional capacity upon discharge (04/29/2015)   Time 8   Period Weeks   Status On-going               Plan - 03/25/15 1719    Clinical Impression Statement Elizabeth Diaz reports a little more relief since the last visit at an 8/10. She was able to perform LAQ, and SAQ as well as bridges with bolster underneath the knee with minimal increase in pain and sypmtoms. performed a mcconnel tape job to push down the patella to help even dispursing even pressure throughout the underside of the patella which she reported no pain with walking.    PT Next Visit Plan ionto and progress as tolerated with strengthening, mcconnell taping?   Consulted and Agree with Plan of Care Patient        Problem List Patient Active Problem List   Diagnosis Date Noted  . Dizziness 01/28/2015  . Knee pain 12/07/2014  . Change in bowel habits   . Benign neoplasm of rectum   . Frequent loose stools   . Right-sided back pain 05/14/2014  . Fecal incontinence 05/06/2014   Starr Lake PT, DPT, LAT, ATC  03/25/2015  5:24 PM    [facility] [address] [place], [place], [address] Phone: [phone]   Fax:   [phone]

## 2015-03-28 ENCOUNTER — Ambulatory Visit: Payer: BLUE CROSS/BLUE SHIELD | Admitting: Physical Therapy

## 2015-03-28 DIAGNOSIS — M25561 Pain in right knee: Secondary | ICD-10-CM

## 2015-03-28 DIAGNOSIS — M25562 Pain in left knee: Principal | ICD-10-CM

## 2015-03-28 DIAGNOSIS — R269 Unspecified abnormalities of gait and mobility: Secondary | ICD-10-CM

## 2015-03-28 DIAGNOSIS — R293 Abnormal posture: Secondary | ICD-10-CM

## 2015-03-28 DIAGNOSIS — R29898 Other symptoms and signs involving the musculoskeletal system: Secondary | ICD-10-CM

## 2015-03-28 NOTE — Therapy (Signed)
[place], [place], [address] Phone: [phone]   Fax:  [phone]  Physical Therapy Treatment  Patient Details  Name: Elizabeth Diaz MRN: [id number] Date of Birth: September 04, 1966 Referring Provider:  Rosemarie Ax, MD  Encounter Date: 03/28/2015      PT End of Session - 03/28/15 1521    Visit Number 7   Number of Visits 16   Date for PT Re-Evaluation 04/24/15   PT Start Time 0317   PT Stop Time 0409   PT Time Calculation (min) 52 min      Past Medical History  Diagnosis Date  . Pneumothorax   . Arthritis   . Seizures     hx of, no seizures last few years  . Numbness     left leg  . Hyperplastic colon polyp   . Diverticulosis     Past Surgical History  Procedure Laterality Date  . Chest tube insertion    . Tubal ligation    . Colonoscopy with propofol N/A 11/01/2014    Procedure: COLONOSCOPY WITH PROPOFOL;  Surgeon: Jerene Bears, MD;  Location: [facility];  Service: Gastroenterology;  Laterality: N/A;    There were no vitals filed for this visit.  Visit Diagnosis:  Bilateral knee pain  Leg weakness, bilateral  Abnormality of gait  Posture abnormality      Subjective Assessment - 03/28/15 1519    Subjective Right 7/10 left 2/10 knee pain   Currently in Pain? Yes   Pain Score 7    Pain Location Knee   Pain Orientation Right   Pain Type Chronic pain   Pain Frequency Constant   Aggravating Factors  weight bearing, different position at work   Pain Relieving Factors laying down, tramadol            [facility] PT Assessment - 03/28/15 0001    Observation/Other Assessments   Focus on Therapeutic Outcomes (FOTO)  67% limited (72% limited    AROM   Right Knee Extension -3   Right Knee Flexion 100   Left Knee Extension 0   Left Knee Flexion 125   Strength   Right/Left Knee Right;Left   Right Knee Flexion 4-/5   Right Knee Extension 3+/5   Left Knee Flexion 4+/5   Left  Knee Extension 4+/5                     [facility] Adult PT Treatment/Exercise - 03/28/15 1522    Knee/Hip Exercises: Standing   Knee Flexion Right;10 reps   Knee/Hip Exercises: Seated   Long Arc Quad AROM;Strengthening;Both;2 sets;10 reps   Long Arc Quad Weight 3 lbs.  left   Knee/Hip Exercises: Supine   Short Arc Quad Sets AROM;Strengthening;2 sets;10 reps   Short Arc Quad Sets Limitations 5#  left , no wt on right   Modalities   Modalities Cryotherapy   Cryotherapy   Number Minutes Cryotherapy 15 Minutes   Cryotherapy Location Knee   Type of Cryotherapy Ice pack   Electrical Stimulation   Electrical Stimulation Location R ant knee   Electrical Stimulation Action IFC   Electrical Stimulation Parameters 15 min level 24   Electrical Stimulation Goals Pain                  PT Short Term Goals - 03/28/15 1544    PT SHORT TERM GOAL #1   Title pt will be I with basic HEP (03/29/2015)   Time  4   Period Weeks   Status Achieved   PT SHORT TERM GOAL #2   Title pt will increase bil knee AROM by > 10 degrees to assist with funcitonal exercise progression (03/29/2015)   Time 4   Period Weeks   Status Achieved   PT SHORT TERM GOAL #3   Title she will increase bil knee strength to >4-/5 with < 6/10 pain to help promote functional strength and endurance (03/29/2015)   Time 4   Period Weeks   Status Partially Met   PT SHORT TERM GOAL #4   Title She will be able to verbalize and demonsrate techniques to reduce bil knee inflammation via RICE method (03/29/2015)   Time 4   Period Weeks   Status Achieved   PT SHORT TERM GOAL #5   Title She will increase her FOTO score by > 5 points to assist with increaed functional capacity (03/29/2015)   Time 4   Period Weeks   Status Achieved           PT Long Term Goals - 03/28/15 1547    PT LONG TERM GOAL #1   Title pt will be I with advanced HEP progression as of last exercises given upon discharge (04/29/2015)   Time 8    Period Weeks   Status On-going   PT LONG TERM GOAL #2   Title pt will be able to tolerated standing/walking for > 10 min with LRAD for safety to assist with funcitonal community ambulation. (04/29/2015)   Time 8   Period Weeks   Status Achieved   PT LONG TERM GOAL #3   Title She will increase bil knee AROM to [facility] to assist with gait efficicney and safety to progress (04/29/2015)   Time 8   Period Weeks   Status Partially Met   PT LONG TERM GOAL #4   Title She will Increase bil knee strength to > 4/5 to help with safety during weight bearing activities and funcitonal gait/edurance (04/29/2015)   Time 8   Period Weeks   Status Partially Met   PT LONG TERM GOAL #5   Title She will increase her FOTO score to > 50  to assist with increased functional capacity upon discharge (04/29/2015)   Time 8   Period Weeks   Status On-going               Plan - 03/28/15 1614    Clinical Impression Statement Pt reports improvements in pain and ability to manage pain. She tolerated ROM and MMT testing today.  She improved her FOTO score by 5 points. She is able to tolerate ambulating 10 minutes or more with LRAD. She uses a RW in her home. She is unable to take it to work. She continues with antalgic gait and high levels of right knee pain. She tolerated resistance with left knee strengthening today and performed AROm on right knee with improved ROM and strength. LTG# 3, 4 partially met. Most Stgs Met.    PT Next Visit Plan issue RW, see what orthopedic said        Problem List Patient Active Problem List   Diagnosis Date Noted  . Dizziness 01/28/2015  . Knee pain 12/07/2014  . Change in bowel habits   . Benign neoplasm of rectum   . Frequent loose stools   . Right-sided back pain 05/14/2014  . Fecal incontinence 05/06/2014    Dorene Ar, PTA 03/28/2015, 5:13 PM  [facility]  [place], [place], [address] Phone:  [phone]   Fax:  [phone]

## 2015-03-29 ENCOUNTER — Ambulatory Visit: Payer: BLUE CROSS/BLUE SHIELD | Admitting: Family Medicine

## 2015-04-03 ENCOUNTER — Ambulatory Visit: Payer: BLUE CROSS/BLUE SHIELD | Admitting: Family Medicine

## 2015-04-04 ENCOUNTER — Ambulatory Visit: Payer: BLUE CROSS/BLUE SHIELD | Admitting: Physical Therapy

## 2015-04-05 ENCOUNTER — Encounter (HOSPITAL_COMMUNITY): Payer: Self-pay

## 2015-04-05 ENCOUNTER — Emergency Department (INDEPENDENT_AMBULATORY_CARE_PROVIDER_SITE_OTHER)
Admission: EM | Admit: 2015-04-05 | Discharge: 2015-04-05 | Disposition: A | Discharge status: Home / Self Care | Payer: BLUE CROSS/BLUE SHIELD | Source: Home / Self Care

## 2015-04-05 DIAGNOSIS — R600 Localized edema: Secondary | ICD-10-CM

## 2015-04-05 MED ORDER — FUROSEMIDE 40 MG PO TABS
40.0000 mg | ORAL_TABLET | Freq: Every day | ORAL | Status: DC | PRN
Start: 2015-04-05 — End: 2015-12-12

## 2015-04-05 NOTE — ED Notes (Signed)
C/o pain in both knees. Swelling in both legs and feet R>L . Swelling always went away on its own

## 2015-04-05 NOTE — Discharge Instructions (Signed)
Please return in 3 days if the swelling persists or worsens.  Try to keep legs elevated as much as possible and avoid salty foods like [place], [place], or pizza.

## 2015-04-05 NOTE — ED Provider Notes (Signed)
CSN: [id number]     Arrival date & time 04/05/15  1714 History   First MD Initiated Contact with Patient 04/05/15 1746     Chief Complaint  Patient presents with  . Leg Swelling   (Consider location/radiation/quality/duration/timing/severity/associated sxs/prior Treatment) Patient is a 49 y.o. female presenting with leg pain. The history is provided by the patient. No language interpreter was used.  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  She's seeing an orthopedic doctor for chronic right knee pain  Past Medical History  Diagnosis Date  . Pneumothorax   . Arthritis   . Seizures     hx of, no seizures last few years  . Numbness     left leg  . Hyperplastic colon polyp   . Diverticulosis    Past Surgical History  Procedure Laterality Date  . Chest tube insertion    . Tubal ligation    . Colonoscopy with propofol N/A 11/01/2014    Procedure: COLONOSCOPY WITH PROPOFOL;  Surgeon: Jerene Bears, MD;  Location: [facility];  Service: Gastroenterology;  Laterality: N/A;   Family History  Problem Relation Age of Onset  . Heart failure Father   . Diabetes Maternal Grandmother   . Diabetes Paternal Grandfather   . Diabetes Father   . Cancer Maternal Grandfather     unknown type   History  Substance Use Topics  . Smoking status: Former Smoker -- 1.50 packs/day for 20 years    Types:  Cigarettes    Quit date: 05/04/2009  . Smokeless tobacco: Never Used  . Alcohol Use: 0.0 oz/week    0 Standard drinks or equivalent per week     Comment: drinks liquor every night. 2-3 glasses    OB History    No data available     Review of Systems  Constitutional: Negative.   HENT: Negative.   Eyes: Negative.   Respiratory: Negative.   Cardiovascular: Negative.   Gastrointestinal: Negative.   Genitourinary: Negative.   Musculoskeletal: Positive for joint swelling, arthralgias, gait problem and stiffness.  Skin: Negative.   Neurological: Positive for numbness.    Allergies  Review of patient's allergies indicates no known allergies.  Home Medications   Prior to Admission medications   Medication Sig Start Date End Date Taking? Authorizing Provider  traMADol (ULTRAM) 50 MG tablet Take 1 tablet (50 mg total) by mouth every 6 (six) hours as needed. 02/12/15  Yes Rosemarie Ax, MD  cyclobenzaprine (FLEXERIL) 10 MG tablet Take 0.5-1 tablets (5-10 mg total) by mouth 3 (three) times daily as needed for muscle spasms. Patient not taking: Reported on 02/27/2015 08/02/14   Rosemarie Ax, MD  gabapentin (NEURONTIN) 100 MG capsule Take 1 capsule (100 mg total) by mouth 3 (three) times daily. Patient not  taking: Reported on 02/27/2015 02/12/15   Rosemarie Ax, MD  ibuprofen (ADVIL,MOTRIN) 200 MG tablet Take 400-800 mg by mouth every 6 (six) hours as needed for moderate pain.    Historical Provider, MD  Lido-Capsaicin-Men-Methyl Sal 0.5-0.035-5-20 % [facility] Apply 1 patch topically 2 (two) times daily as needed (pain). 03/18/15   Clayton Bibles, PA-C  meloxicam (MOBIC) 7.5 MG tablet Take 1 tablet (7.5 mg total) by mouth daily. Patient not taking: Reported on 02/27/2015 02/12/15   Rosemarie Ax, MD  naproxen (NAPROSYN) 500 MG tablet Take 1 tablet (500 mg total) by mouth 2 (two) times daily with a meal. For 5 days, then take as needed for pain. Patient not taking: Reported on 02/27/2015 12/07/14    Archie Patten, MD   BP 138/79 mmHg  Pulse 86  Temp(Src) 98.5 F (36.9 C) (Oral)  Resp 18  SpO2 98%  LMP 02/20/2015 Physical Exam  Constitutional: She appears well-developed and well-nourished.  HENT:  Head: Normocephalic and atraumatic.  Neck: Normal range of motion. Neck supple. No thyromegaly present.  Cardiovascular: Normal rate, regular rhythm, normal heart sounds and intact distal pulses.   Pulmonary/Chest: Effort normal and breath sounds normal.  Musculoskeletal: She exhibits edema and tenderness.  2 to 3+ pitting edema both lower legs.  Tender with pretibial pressure.  Nontender calves.  Skin: Skin is warm and dry. No rash noted. No erythema.  Psychiatric: She has a normal mood and affect.  Nursing note and vitals reviewed.   ED Course  Procedures (including critical care time) Labs Review Labs Reviewed - No data to display  Imaging Review No results found.   MDM  Pedal edema without specific cause.  The hot weather may play a role as her job requires 8-9 hours on her feet daily.  Will provide Lasix for prn use and keep OOW with legs elevated until Monday.  Salt restrictions discussed  Robyn Haber, MD    Robyn Haber, MD 04/05/15 Carollee Massed

## 2015-04-08 ENCOUNTER — Ambulatory Visit (INDEPENDENT_AMBULATORY_CARE_PROVIDER_SITE_OTHER): Payer: BLUE CROSS/BLUE SHIELD | Admitting: Family Medicine

## 2015-04-08 ENCOUNTER — Encounter: Payer: Self-pay | Admitting: Family Medicine

## 2015-04-08 VITALS — BP 118/42 | HR 76 | Temp 98.3°F | Wt 267.0 lb

## 2015-04-08 DIAGNOSIS — R3981 Functional urinary incontinence: Secondary | ICD-10-CM | POA: Diagnosis not present

## 2015-04-08 DIAGNOSIS — R32 Unspecified urinary incontinence: Secondary | ICD-10-CM | POA: Insufficient documentation

## 2015-04-08 DIAGNOSIS — R159 Full incontinence of feces: Secondary | ICD-10-CM

## 2015-04-08 DIAGNOSIS — M25562 Pain in left knee: Secondary | ICD-10-CM

## 2015-04-08 DIAGNOSIS — G8929 Other chronic pain: Secondary | ICD-10-CM

## 2015-04-08 DIAGNOSIS — M25561 Pain in right knee: Secondary | ICD-10-CM

## 2015-04-08 DIAGNOSIS — R600 Localized edema: Secondary | ICD-10-CM | POA: Diagnosis not present

## 2015-04-08 MED ORDER — TRAMADOL HCL 50 MG PO TABS: 50.0000 mg | ORAL_TABLET | Freq: Four times a day (QID) | ORAL | Status: DC | PRN

## 2015-04-08 MED ORDER — CYCLOBENZAPRINE HCL 10 MG PO TABS: 5.0000 mg | ORAL_TABLET | Freq: Three times a day (TID) | ORAL | Status: DC | PRN

## 2015-04-08 NOTE — Assessment & Plan Note (Signed)
Recent acute on chronic worsening b/l lower ext pitting edema, worse within past few weeks. May be multifactorial and related to limited mobility due to knee pain, h/o obesity, likely component of chronic venous stasis. No active CP / SOB or orthopnea. Previous lab work-up without clear etiology. Kidney / Liver fxn stable within past 1 year.  Plan: 1. Elevation above heart level, now off work for up to 2 weeks, stay active but relative rest. 2. Low salt diet 3. May take Lasix rx by Urgent Care (fill rx first) then take daily PRN, not for >3 days in a row unless improving and has follow-up arranged. 4. Re-check BMET at follow-up

## 2015-04-08 NOTE — Assessment & Plan Note (Signed)
Chronic prior h/o urinary incontinence, now worse with limited mobility and unable to make it to bathroom in time. - No recent worsening or other red flags  Plan: 1. Treat primary problem, knee pain / mobility

## 2015-04-08 NOTE — Patient Instructions (Signed)
Dear Elizabeth Diaz, Thank you for coming in to clinic today.  1. I think your problems are all related to your chronic knee pain - Important to follow with orthopedic surgeon. Continue physical therapy - given Flexeril and refill Tramadol rx today - Please fill Celebrex from Ortho and start taking, get patch for pain relief - May take Tylenol Extra Strength 500mg  tabs - take 1-2 every 6 hours as needed (MAX 8 in 24 hours) - DO NOT TAKE ANY ADVIL / IBUPROFEN (no mobic, meloxicam, naproxen, aleve) - while on celebrex - If you fill prior lasix rx, take 1 tablet as needed daily for a few days then stop - VERY important to elevated legs above heart level  If symptoms worsening, may need to follow-up sooner.  Please schedule a follow-up appointment with Dr. Raeford Razor in 2 to 4 weeks, also with [facility] ortho  If you have any other questions or concerns, please feel free to call the clinic to contact me. You may also schedule an earlier appointment if necessary.  However, if your symptoms get significantly worse, please go to the Emergency Department to seek immediate medical attention.  [name], [place]

## 2015-04-08 NOTE — Assessment & Plan Note (Signed)
Stable without worsening. Work-up previously negative for spinal cord compression or cauda equina. Followed by GI further work-up (recent negative colonoscopy)

## 2015-04-08 NOTE — Assessment & Plan Note (Addendum)
Chronic persistent knee pain R>L, most consistent with underlying OA, likely flare with trigger standing >9 hours at work. - Last imaging R-knee x-ray (6/20) d/t injury at PT with mild spurring and bi-compartment degeneration, otherwise no acute. - Failed multiple medical therapies, currently in PT, followed by Ortho may need surgical consultation  Plan: 1. Start taking previously rx Celebrex as prescribed by Ortho 2. Follow-up with Ortho within next 2-4 weeks, may need to discuss potential surgical options 3. Refilled Flexeril and Tramadol PRN per request. May take Tylenol 4. Avoid all other NSAIDs on celebrex 5. Relative rest, use walker, avoid prolonged standing or strenuous activity 6. Note written out of work x 2 weeks, unable to perform job 7. RTC 2-4 weeks re-evaluation, determine if can return to work

## 2015-04-08 NOTE — Progress Notes (Signed)
   Subjective:    Patient ID: Elizabeth Diaz, female    DOB: 17-Mar-1966, 49 y.o.   MRN: [id number]  Patient presents for a same day appointment.  HPI  RIGHT KNEE PAIN, CHRONIC: - Known chronic h/o R>L knee pain for >6 months, previously with known OA in spine, history suggestive of arthritis in knees. Followed by Frederik Pear (unsure when next f/u). S/p previous b/l knee steroid inj (without significant relief), reportedly waiting on "gel" (likely synvisc) injections pending authorization. - Has followed up at [facility] multiple times since 10/2014 for same complaint, trial therapies included Naproxen, Mobic, Tramadol, Gabapentin, all without significant relief. Did improve with Flexeril some. - Currently going to PT regularly with some improvement. Using walker to ambulate, limited mobility due to pain - Last Right Knee X-ray 6/20 (at PT due to reported injury), results with no acute bony abnormality or fracture, identified mild spurring with mild jt space narrowing med/lat compartments. No joint effusion. - Today presents with same current R-knee pain, which had improved with rest over weekend.  Now she was unable to go to work due to knee pain. Stated she works at [employer] and difficulty standing 9 hours without breaks. Recently went to Ortho given Celebrex and lidoderm patch but has not filled rx, plans to soon. - Admits bowel / bladder incontinence (see below) - Denies any fevers/chills, unintentional wt loss, urinary retention, numbness, weakness, tingling  B/l LE EDEMA: - History of some b/l lower ext edema. Recently worse than previous, went to [facility] on 7/8 for lower leg / pedal edema, given Lasix PRN and advised to elevate. Patient has not filled Lasix rx. Improved with elevation but worse after ambulates. - no history of DVT/PE - Denies claudication symptoms, asymmetry of swelling, redness, warmth, fevers  URINARY / BOWEL INCONTINENCE: - Fecal/bowel incontinence has been chronic problem  for past few months, followed by GI had colonoscopy negative, previously with diarrhea and loose stools. Now taking iron and has more solid stools. She has had work-up with MRI imaging and ruled out spinal cord compression. No change in current symptom. - Currently complains of urinary continence due to unable to make it to bathroom in time due to her R-knee pain and limited mobility - Admits to feeling urge to urinate and can hold / control bladder for limited time only  I have reviewed and updated the following as appropriate: allergies and current medications  Social Hx: - Currently living alone, ambulates with walker  Review of Systems  See above HPI    Objective:   Physical Exam  BP 118/42 mmHg  Pulse 76  Temp(Src) 98.3 F (36.8 C) (Oral)  Wt 267 lb (121.11 kg)  LMP 03/29/2015 (Exact Date)  Gen - chronically ill, obese, well-appearing otherwise and cooperative, NAD Heart - RRR, no murmurs heard Lungs - CTAB, no wheezing, crackles, or rhonchi. Normal work of breathing. Abd - soft, NTND, no masses, +active BS MSK: Right Knee mild effusion compared to Left, without erythema, mild anterior generalized +TTP without clear localization, pain with active ROM limited extension and reduced flexion, +crepitus. Ext - b/l +1 pitting edema below knees to feet, peripheral pulses intact +2 b/l Skin - warm, dry, no rashes Neuro - awake, alert, oriented, grossly non-focal, intact muscle strength 5/5 b/l grip and lower ext dorsiflexion, intact distal sensation to light touch, gait slow with walker antalgic with R-knee     Assessment & Plan:   See specific A&P problem list for details.

## 2015-04-09 NOTE — ED Notes (Signed)
Patient requested a copy of work note

## 2015-04-10 ENCOUNTER — Telehealth: Payer: Self-pay | Admitting: Family Medicine

## 2015-04-10 NOTE — Telephone Encounter (Signed)
Patient requests PCP to complete and sign Disability Form ASAP. Please, follow up with Patient.

## 2015-04-11 NOTE — Telephone Encounter (Signed)
Put in PCP's box. Ottis Stain, CMA

## 2015-04-15 NOTE — Telephone Encounter (Signed)
Form completed and placed up front.   Rosemarie Ax, MD PGY-3, [facility] 04/15/2015, 1:55 PM

## 2015-04-15 NOTE — Telephone Encounter (Signed)
Spoke to patient. Informed that form is up front, ready to be picked up. Ottis Stain, CMA

## 2015-04-16 ENCOUNTER — Ambulatory Visit: Payer: BLUE CROSS/BLUE SHIELD | Attending: Family Medicine | Admitting: Physical Therapy

## 2015-04-16 DIAGNOSIS — M25561 Pain in right knee: Secondary | ICD-10-CM

## 2015-04-16 DIAGNOSIS — R269 Unspecified abnormalities of gait and mobility: Secondary | ICD-10-CM

## 2015-04-16 DIAGNOSIS — R293 Abnormal posture: Secondary | ICD-10-CM | POA: Diagnosis present

## 2015-04-16 DIAGNOSIS — R29898 Other symptoms and signs involving the musculoskeletal system: Secondary | ICD-10-CM | POA: Diagnosis present

## 2015-04-16 DIAGNOSIS — M25562 Pain in left knee: Secondary | ICD-10-CM | POA: Insufficient documentation

## 2015-04-16 NOTE — Patient Instructions (Signed)
Straight Leg Raise   Tighten stomach and slowly raise locked right leg _12___ inches from floor. Repeat __5-10__ times per set. Do _2___ sets per session. Do _2___ sessions per day.    Knee Flexion: Resisted (Sitting)   Sit with band under left foot and looped around ankle of supported leg. Pull unsupported leg back. Repeat _10___ times per set. Do __2__ sets per session. Do _2___ sessions per day.  http://orth.exer.us/695   Copyright  VHI. All rights reserved.

## 2015-04-16 NOTE — Therapy (Signed)
[place], [place], [address] Phone: [phone]   Fax:  [phone]  Physical Therapy Treatment  Patient Details  Name: Elizabeth Diaz MRN: [id number] Date of Birth: [birth year] Referring Provider:  Rosemarie Ax, MD  Encounter Date: 04/16/2015      PT End of Session - 04/16/15 1648    Visit Number 8   Number of Visits 16   Date for PT Re-Evaluation 04/24/15   PT Start Time 0444   PT Stop Time 0530   PT Time Calculation (min) 46 min      Past Medical History  Diagnosis Date  . Pneumothorax   . Arthritis   . Seizures     hx of, no seizures last few years  . Numbness     left leg  . Hyperplastic colon polyp   . Diverticulosis     Past Surgical History  Procedure Laterality Date  . Chest tube insertion    . Tubal ligation    . Colonoscopy with propofol N/A 11/01/2014    Procedure: COLONOSCOPY WITH PROPOFOL;  Surgeon: Jerene Bears, MD;  Location: [facility];  Service: Gastroenterology;  Laterality: N/A;    There were no vitals filed for this visit.  Visit Diagnosis:  Bilateral knee pain  Leg weakness, bilateral  Abnormality of gait  Posture abnormality      Subjective Assessment - 04/16/15 1645    Subjective Right 5/10 left not hurting now, but it was the other day. The doctor took me out of work for 2 weeks. I have been doing my exercises   Currently in Pain? Yes   Pain Score 5    Pain Location Knee   Pain Orientation Right   Pain Descriptors / Indicators Sore;Aching   Pain Frequency Constant   Aggravating Factors  standing   Pain Relieving Factors laying down, tramadol,             [facility] PT Assessment - 04/16/15 1652    AROM   Right Knee Flexion 117   Strength   Strength Assessment Site Hip   Right/Left Hip Right;Left   Right Hip Flexion 4-/5   Left Hip Flexion 5/5   Right Knee Flexion 4-/5   Right Knee Extension 3+/5   Left Knee Flexion 5/5   Left Knee  Extension 5/5                     [facility] Adult PT Treatment/Exercise - 04/16/15 1654    Knee/Hip Exercises: Seated   Long Arc Quad Strengthening;Right;2 sets;10 reps   Long Arc Quad Weight 2 lbs.  right   Other Seated Knee/Hip Exercises seated hamstring curl with red band x 10 difficult and painful   Knee/Hip Exercises: Supine   Quad Sets 5 reps   Quad Sets Limitations painful   Short Arc Quad Sets Strengthening;Right;1 set;10 reps   Short Arc Quad Sets Limitations 3#  right   Straight Leg Raises 2 sets;5 reps   Other Supine Knee/Hip Exercises hip flexion x 10 right   Modalities   Modalities Cryotherapy   Cryotherapy   Number Minutes Cryotherapy 15 Minutes   Cryotherapy Location Knee   Type of Cryotherapy Ice pack                PT Education - 04/16/15 1716    Education provided Yes   Education Details hamstring curls with red band and SLR   Person(s) Educated Patient   Methods  Explanation;Handout   Comprehension Verbalized understanding          PT Short Term Goals - 03/28/15 1544    PT SHORT TERM GOAL #1   Title pt will be I with basic HEP (03/29/2015)   Time 4   Period Weeks   Status Achieved   PT SHORT TERM GOAL #2   Title pt will increase bil knee AROM by > 10 degrees to assist with funcitonal exercise progression (03/29/2015)   Time 4   Period Weeks   Status Achieved   PT SHORT TERM GOAL #3   Title she will increase bil knee strength to >4-/5 with < 6/10 pain to help promote functional strength and endurance (03/29/2015)   Time 4   Period Weeks   Status Partially Met   PT SHORT TERM GOAL #4   Title She will be able to verbalize and demonsrate techniques to reduce bil knee inflammation via RICE method (03/29/2015)   Time 4   Period Weeks   Status Achieved   PT SHORT TERM GOAL #5   Title She will increase her FOTO score by > 5 points to assist with increaed functional capacity (03/29/2015)   Time 4   Period Weeks   Status Achieved            PT Long Term Goals - 03/28/15 1547    PT LONG TERM GOAL #1   Title pt will be I with advanced HEP progression as of last exercises given upon discharge (04/29/2015)   Time 8   Period Weeks   Status On-going   PT LONG TERM GOAL #2   Title pt will be able to tolerated standing/walking for > 10 min with LRAD for safety to assist with funcitonal community ambulation. (04/29/2015)   Time 8   Period Weeks   Status Achieved   PT LONG TERM GOAL #3   Title She will increase bil knee AROM to [facility] to assist with gait efficicney and safety to progress (04/29/2015)   Time 8   Period Weeks   Status Partially Met   PT LONG TERM GOAL #4   Title She will Increase bil knee strength to > 4/5 to help with safety during weight bearing activities and funcitonal gait/edurance (04/29/2015)   Time 8   Period Weeks   Status Partially Met   PT LONG TERM GOAL #5   Title She will increase her FOTO score to > 50  to assist with increased functional capacity upon discharge (04/29/2015)   Time 8   Period Weeks   Status On-going               Plan - 04/16/15 1719    Clinical Impression Statement Pt reports the MD took her out of work until July 25th. The MD is trying to get gel injections approved for her knees. She is Electronics engineer. She presents with increased left knee strength  but continued right knee weakness. Her AROM for flexion has improved in her right knee to nearly the same as her left.    PT Next Visit Plan continue right knee strength        Problem List Patient Active Problem List   Diagnosis Date Noted  . Bilateral lower extremity edema 04/08/2015  . Urinary incontinence 04/08/2015  . Dizziness 01/28/2015  . Chronic knee pain 12/07/2014  . Change in bowel habits   . Benign neoplasm of rectum   . Frequent loose stools   . Right-sided back pain  05/14/2014  . Fecal incontinence 05/06/2014    Dorene Ar, PTA 04/16/2015, 5:22 PM  [facility] [address] [place], [place], [address] Phone: [phone]   Fax:  [phone]

## 2015-04-18 ENCOUNTER — Telehealth: Payer: Self-pay | Admitting: Family Medicine

## 2015-04-18 ENCOUNTER — Ambulatory Visit: Payer: BLUE CROSS/BLUE SHIELD | Admitting: Physical Therapy

## 2015-04-18 NOTE — Telephone Encounter (Signed)
Nurse with [employer]: Wants to verify is the pt was putlled out of work 04-03-15 and when she will be able to return to work How long will she be out?

## 2015-04-19 ENCOUNTER — Ambulatory Visit (INDEPENDENT_AMBULATORY_CARE_PROVIDER_SITE_OTHER): Payer: BLUE CROSS/BLUE SHIELD | Admitting: Family Medicine

## 2015-04-19 VITALS — BP 133/54 | HR 91 | Temp 98.6°F | Ht 60.0 in | Wt 261.5 lb

## 2015-04-19 DIAGNOSIS — G8929 Other chronic pain: Secondary | ICD-10-CM

## 2015-04-19 DIAGNOSIS — M25569 Pain in unspecified knee: Secondary | ICD-10-CM

## 2015-04-19 NOTE — Telephone Encounter (Signed)
[employer] spoke with the information desk. Gave them information that they're requesting.. And if they need any further material and they will fax to our office.  Rosemarie Ax, MD PGY-3, [facility] 04/19/2015, 8:48 AM

## 2015-04-19 NOTE — Progress Notes (Addendum)
   Subjective:     HPI  Elizabeth Diaz is here for chronic knee pain.   Knee pain: present for 1 year and getting worse. She has seen orthopedic and awaiting to get gel injection.  Physical therapist has recommended that she use her walker all the time. Has pain at night. No injury or trauma in the interim.  - works at [employer]. Works as a Veterinary surgeon.  - activities include washing dishes. Set up breakfast. Prepping food. Sweeps and mops - she is walking on her feet during all of these activities. Arrives as 6 am and leaves at 2 pm . - she is unable to use a walker during this.  - currently short staffed and unable to receive breaks.  - engaged in PT until the end of Aug  - short term disability paperwork has been completed  - she is on flexeril, tramadol and celebrex.  - has received steroid injections in the past w/out relief.    Health Maintenance:  Health Maintenance Due  Topic Date Due  . PAP SMEAR  12/27/1983  . TETANUS/TDAP  12/26/1984    -  reports that she quit smoking about 5 years ago. Her smoking use included Cigarettes. She has a 30 pack-year smoking history. She has never used smokeless tobacco. - Review of Systems: Per HPI. All other systems reviewed and are negative. - Past Medical History: Patient Active Problem List   Diagnosis Date Noted  . Bilateral lower extremity edema 04/08/2015  . Urinary incontinence 04/08/2015  . Dizziness 01/28/2015  . Chronic knee pain 12/07/2014  . Change in bowel habits   . Benign neoplasm of rectum   . Frequent loose stools   . Right-sided back pain 05/14/2014  . Fecal incontinence 05/06/2014   - Medications: reviewed and updated Current Outpatient Prescriptions  Medication Sig Dispense Refill  . cyclobenzaprine (FLEXERIL) 10 MG tablet Take 0.5-1 tablets (5-10 mg total) by mouth 3 (three) times daily as needed for muscle spasms. 60 tablet 0  . furosemide (LASIX) 40 MG tablet Take 1 tablet (40 mg total) by mouth daily as needed  for edema. (Patient not taking: Reported on 04/08/2015) 30 tablet 0  . gabapentin (NEURONTIN) 100 MG capsule Take 1 capsule (100 mg total) by mouth 3 (three) times daily. (Patient not taking: Reported on 02/27/2015) 90 capsule 0  . Lido-Capsaicin-Men-Methyl Sal 0.5-0.035-5-20 % PTCH Apply 1 patch topically 2 (two) times daily as needed (pain). (Patient not taking: Reported on 04/08/2015) 30 patch 0  . traMADol (ULTRAM) 50 MG tablet Take 1 tablet (50 mg total) by mouth every 6 (six) hours as needed. 30 tablet 0   No current facility-administered medications for this visit.     Review of Systems See HPI     Objective:   Physical Exam BP 133/54 mmHg  Pulse 91  Temp(Src) 98.6 F (37 C) (Oral)  Ht 5' (1.524 m)  Wt 261 lb 8 oz (118.616 kg)  BMI 51.07 kg/m2  LMP 04/17/2015 Gen: NAD, alert, cooperative with exam,  Neuro: no gross deficits.  MSK: no erythema or ecchymosis of left or right knee  TTP along medial and lateral joint line of right and left knee  Full ROM of b/l knees  5/5 strength in LE b/l  Neurovascularly intact b/l  Crepitus palpated b/l     Assessment & Plan:

## 2015-04-19 NOTE — Patient Instructions (Signed)
Thank you for coming in,   Please follow up with me at the end of September.   Please bring all of your medications with you to each visit.    Please feel free to call with any questions or concerns at any time, at [phone]. --Dr. Raeford Razor

## 2015-04-20 NOTE — Assessment & Plan Note (Signed)
Paperwork has been filled out for short term disability  Has FMLA paperwork to fill out today  She has PT sessions until the end of Aug and has f/u with ortho for gel injections Provided note for work to keep her out of work until Sept 30 at which point these interventions will be done and can re-evalute her statis.  She will continued to use walker, medications, and home exercises  - f/u at the end of Sept

## 2015-04-22 ENCOUNTER — Ambulatory Visit: Payer: BLUE CROSS/BLUE SHIELD | Admitting: Physical Therapy

## 2015-04-22 ENCOUNTER — Telehealth: Payer: Self-pay | Admitting: Family Medicine

## 2015-04-22 DIAGNOSIS — M25562 Pain in left knee: Principal | ICD-10-CM

## 2015-04-22 DIAGNOSIS — R29898 Other symptoms and signs involving the musculoskeletal system: Secondary | ICD-10-CM

## 2015-04-22 DIAGNOSIS — R269 Unspecified abnormalities of gait and mobility: Secondary | ICD-10-CM

## 2015-04-22 DIAGNOSIS — M25561 Pain in right knee: Secondary | ICD-10-CM

## 2015-04-22 DIAGNOSIS — R293 Abnormal posture: Secondary | ICD-10-CM

## 2015-04-22 NOTE — Telephone Encounter (Signed)
Elizabeth Diaz is here because she states that she left some paperwork to be filled out on Friday with her PCP. She would like to know the status of these forms as she needs them no later than Wednesday for her place of employment. Please call the pt and inform her of when they are ready. Thank you, Elizabeth Diaz, ASA

## 2015-04-22 NOTE — Therapy (Signed)
[place], [place], [address] Phone: [phone]   Fax:  [phone]  Physical Therapy Treatment / Renewal  Patient Details  Name: Elizabeth Diaz MRN: [id number] Date of Birth: 1966/05/31 Referring Provider:  Rosemarie Ax, MD  Encounter Date: 04/22/2015      PT End of Session - 04/22/15 1620    Visit Number 9   Number of Visits 17   Date for PT Re-Evaluation 05/20/15   PT Start Time 8144   PT Stop Time 1655   PT Time Calculation (min) 50 min   Activity Tolerance Patient tolerated treatment well;Patient limited by pain   Behavior During Therapy [facility] for tasks assessed/performed      Past Medical History  Diagnosis Date  . Pneumothorax   . Arthritis   . Seizures     hx of, no seizures last few years  . Numbness     left leg  . Hyperplastic colon polyp   . Diverticulosis     Past Surgical History  Procedure Laterality Date  . Chest tube insertion    . Tubal ligation    . Colonoscopy with propofol N/A 11/01/2014    Procedure: COLONOSCOPY WITH PROPOFOL;  Surgeon: Jerene Bears, MD;  Location: [facility];  Service: Gastroenterology;  Laterality: N/A;    There were no vitals filed for this visit.  Visit Diagnosis:  Bilateral knee pain - Plan: PT plan of care cert/re-cert  Leg weakness, bilateral - Plan: PT plan of care cert/re-cert  Abnormality of gait - Plan: PT plan of care cert/re-cert  Posture abnormality - Plan: PT plan of care cert/re-cert      Subjective Assessment - 04/22/15 1607    Subjective "Saturday I couldn't get out of bed because of the pain which may have been from cleaning" pt reports her PCP took her out of work until the end of September.    Currently in Pain? Yes   Pain Score 6    Pain Location Knee   Pain Orientation Right   Pain Descriptors / Indicators Aching;Sore   Pain Type Chronic pain   Pain Onset More than a month ago   Pain Frequency Constant   Aggravating Factors  standing, walking   Pain Relieving Factors laying down, tramadol            [facility] PT Assessment - 04/22/15 0001    Assessment   Medical Diagnosis bil knee pain   Hand Dominance Right   Prior Therapy yes   Precautions   Precautions None   Restrictions   Weight Bearing Restrictions No   Balance Screen   Has the patient fallen in the past 6 months No   Has the patient had a decrease in activity level because of a fear of falling?  No   Is the patient reluctant to leave their home because of a fear of falling?  No   Home Environment   Living Environment Private residence   Living Arrangements Alone   Type of [place] to enter   Entrance Stairs-Number of Steps [place] One level   [place] - 2 wheels   Prior Function   Level of Independence Independent;Independent with household mobility without device;Independent with community mobility without device;Independent with homemaking with ambulation;Independent with gait;Independent with transfers   Observation/Other Assessments   Focus on Therapeutic Outcomes (FOTO)  76% limited   AROM  Right Knee Extension -3   Right Knee Flexion 115   Left Knee Extension 0   Left Knee Flexion 128   Strength   Right Knee Flexion 3+/5  due to increased pain   Right Knee Extension 3+/5  pain during testing   Left Knee Flexion 5/5   Left Knee Extension 5/5                             PT Education - 04/22/15 1651    Education provided Yes   Education Details sit to stand exercises added to HEP, and continue with POC   Person(s) Educated Patient   Methods Explanation   Comprehension Verbalized understanding          PT Short Term Goals - 04/22/15 1638    PT SHORT TERM GOAL #1   Title pt will be I with basic HEP (03/29/2015)   Time 4   Period Weeks   Status Achieved   PT SHORT TERM GOAL #2   Title pt will increase  bil knee AROM by > 10 degrees to assist with funcitonal exercise progression (03/29/2015)   Time 4   Period Weeks   Status Achieved   PT SHORT TERM GOAL #3   Title she will increase bil knee strength to >4-/5 with < 6/10 pain to help promote functional strength and endurance (03/29/2015)   Baseline 3+/5 in R knee secondary to pain during testing   Time 4   Period Weeks   Status Partially Met   PT SHORT TERM GOAL #4   Title She will be able to verbalize and demonsrate techniques to reduce bil knee inflammation via RICE method (03/29/2015)   Time 4   Period Weeks   Status Achieved   PT SHORT TERM GOAL #5   Title She will increase her FOTO score by > 5 points to assist with increaed functional capacity (03/29/2015)   Time 4   Period Weeks   Status Achieved           PT Long Term Goals - 04/22/15 1639    PT LONG TERM GOAL #1   Title pt will be I with advanced HEP progression as of last exercises given upon discharge (04/29/2015)   Time 8   Period Weeks   Status On-going   PT LONG TERM GOAL #2   Title pt will be able to tolerated standing/walking for > 10 min with LRAD for safety to assist with funcitonal community ambulation. (04/29/2015)   Time 8   Period Weeks   Status Achieved   PT LONG TERM GOAL #3   Title She will increase bil knee AROM to [facility] to assist with gait efficicney and safety to progress (04/29/2015)   Time 8   Period Weeks   Status Partially Met   PT LONG TERM GOAL #4   Title She will Increase bil knee strength to > 4/5 to help with safety during weight bearing activities and funcitonal gait/edurance (04/29/2015)   Time 8   Period Weeks   Status Partially Met   PT LONG TERM GOAL #5   Title She will increase her FOTO score to > 50  to assist with increased functional capacity upon discharge (04/29/2015)   Time 8   Period Weeks   Status On-going               Plan - 04/22/15 1653    Clinical Impression Statement Sharae reports that  she has been taken out of work  by her primary MD until the end of september. No new goals me this visit. she continues to demonstrate limited R knee AROM and strength secondary to pain noted during testing. She continues to demonstrate an antalgic gait pattern. todays visit was limited due to pt having GI issues requiring multiple visits to the bathroom. she would benefit from continued therapy to progress with her current POC.   PT Next Visit Plan continue right knee strength, mobiliity, CKC strengthening if tolerated.    PT Home Exercise Plan sit to stand exercises   Consulted and Agree with Plan of Care Patient        Problem List Patient Active Problem List   Diagnosis Date Noted  . Bilateral lower extremity edema 04/08/2015  . Urinary incontinence 04/08/2015  . Dizziness 01/28/2015  . Chronic knee pain 12/07/2014  . Change in bowel habits   . Benign neoplasm of rectum   . Frequent loose stools   . Right-sided back pain 05/14/2014  . Fecal incontinence 05/06/2014   Starr Lake PT, DPT, LAT, ATC  04/22/2015  5:03 PM    [facility] [address] [place], [place], [address] Phone: [phone]   Fax:  [phone]

## 2015-04-22 NOTE — Patient Instructions (Signed)
   Nicolus Ose PT, DPT, LAT, ATC  [facility] Phone: [phone]     

## 2015-04-24 ENCOUNTER — Ambulatory Visit: Payer: BLUE CROSS/BLUE SHIELD | Admitting: Physical Therapy

## 2015-04-24 DIAGNOSIS — M25562 Pain in left knee: Principal | ICD-10-CM

## 2015-04-24 DIAGNOSIS — M25561 Pain in right knee: Secondary | ICD-10-CM

## 2015-04-24 DIAGNOSIS — R29898 Other symptoms and signs involving the musculoskeletal system: Secondary | ICD-10-CM

## 2015-04-24 DIAGNOSIS — R269 Unspecified abnormalities of gait and mobility: Secondary | ICD-10-CM

## 2015-04-24 DIAGNOSIS — R293 Abnormal posture: Secondary | ICD-10-CM

## 2015-04-24 NOTE — Telephone Encounter (Signed)
Ms. Zendejas is calling and checking on the status of these forms. She states that if she cannot turn them into her place of employment by tomorrow 04/25/2015 then she WILL lose her employment. Sadie Reynolds, ASA

## 2015-04-24 NOTE — Therapy (Signed)
[place], [place], [address] Phone: [phone]   Fax:  [phone]  Physical Therapy Treatment  Patient Details  Name: Elizabeth Diaz MRN: [id number] Date of Birth: 04-28-1966 Referring Provider:  Rosemarie Ax, MD  Encounter Date: 04/24/2015      PT End of Session - 04/24/15 1718    Visit Number 10   Number of Visits 17   Date for PT Re-Evaluation 05/20/15   PT Start Time 1630   PT Stop Time 0867   PT Time Calculation (min) 45 min   Activity Tolerance Patient tolerated treatment well   Behavior During Therapy [facility] for tasks assessed/performed      Past Medical History  Diagnosis Date  . Pneumothorax   . Arthritis   . Seizures     hx of, no seizures last few years  . Numbness     left leg  . Hyperplastic colon polyp   . Diverticulosis     Past Surgical History  Procedure Laterality Date  . Chest tube insertion    . Tubal ligation    . Colonoscopy with propofol N/A 11/01/2014    Procedure: COLONOSCOPY WITH PROPOFOL;  Surgeon: Jerene Bears, MD;  Location: [facility];  Service: Gastroenterology;  Laterality: N/A;    There were no vitals filed for this visit.  Visit Diagnosis:  Bilateral knee pain  Leg weakness, bilateral  Abnormality of gait  Posture abnormality      Subjective Assessment - 04/24/15 1645    Subjective "I haven't been able to get any relief today and last night because of the pain" pt presents to therapy today using her RW.   Currently in Pain? Yes   Pain Score 7    Pain Location Knee   Pain Orientation Right   Pain Descriptors / Indicators Aching   Pain Onset More than a month ago   Pain Frequency Constant   Aggravating Factors  standing, walking   Pain Relieving Factors laying down, tramadol                         [facility] Adult PT Treatment/Exercise - 04/24/15 1646    Knee/Hip Exercises: Aerobic   Stationary Bike Nu Step L3 x 8 min    Knee/Hip Exercises: Standing   Heel Raises Both;2 sets;10 reps   Forward Step Up Right;2 sets;5 reps;Step Height: 2"   Knee/Hip Exercises: Seated   Long Arc Quad Strengthening;Right;2 sets;10 reps   Long Arc Quad Weight 2 lbs.   Other Seated Knee/Hip Exercises seated hamstring curl with red band x 10, seated marching 2 x 15 with 2#   Knee/Hip Exercises: Supine   Quad Sets 5 reps   Heel Slides AROM;AAROM;Right;1 set;5 reps   Bridges AROM;Strengthening;Both;10 reps;1 set  with bolster underneath the knees   Straight Leg Raises 2 sets;5 reps   Other Supine Knee/Hip Exercises --   Iontophoresis   Type of Iontophoresis Dexamethasone   Location Rt., knee   Dose 1cc   Time 6 hour stat patch                PT Education - 04/24/15 1718    Education provided Yes   Education Details HEP review   Person(s) Educated Patient   Methods Explanation   Comprehension Verbalized understanding          PT Short Term Goals - 04/22/15 1638    PT SHORT TERM GOAL #1  Title pt will be I with basic HEP (03/29/2015)   Time 4   Period Weeks   Status Achieved   PT SHORT TERM GOAL #2   Title pt will increase bil knee AROM by > 10 degrees to assist with funcitonal exercise progression (03/29/2015)   Time 4   Period Weeks   Status Achieved   PT SHORT TERM GOAL #3   Title she will increase bil knee strength to >4-/5 with < 6/10 pain to help promote functional strength and endurance (03/29/2015)   Baseline 3+/5 in R knee secondary to pain during testing   Time 4   Period Weeks   Status Partially Met   PT SHORT TERM GOAL #4   Title She will be able to verbalize and demonsrate techniques to reduce bil knee inflammation via RICE method (03/29/2015)   Time 4   Period Weeks   Status Achieved   PT SHORT TERM GOAL #5   Title She will increase her FOTO score by > 5 points to assist with increaed functional capacity (03/29/2015)   Time 4   Period Weeks   Status Achieved           PT Long Term  Goals - 04/22/15 1639    PT LONG TERM GOAL #1   Title pt will be I with advanced HEP progression as of last exercises given upon discharge (04/29/2015)   Time 8   Period Weeks   Status On-going   PT LONG TERM GOAL #2   Title pt will be able to tolerated standing/walking for > 10 min with LRAD for safety to assist with funcitonal community ambulation. (04/29/2015)   Time 8   Period Weeks   Status Achieved   PT LONG TERM GOAL #3   Title She will increase bil knee AROM to [facility] to assist with gait efficicney and safety to progress (04/29/2015)   Time 8   Period Weeks   Status Partially Met   PT LONG TERM GOAL #4   Title She will Increase bil knee strength to > 4/5 to help with safety during weight bearing activities and funcitonal gait/edurance (04/29/2015)   Time 8   Period Weeks   Status Partially Met   PT LONG TERM GOAL #5   Title She will increase her FOTO score to > 50  to assist with increased functional capacity upon discharge (04/29/2015)   Time 8   Period Weeks   Status On-going               Plan - 04/24/15 1718    Clinical Impression Statement Melisa reports some increased pain in her knees and is utilizing her RW today for support due to the pain. She was able to perform all exercises with report of mild pain in the R knee and bil hips following SLR and bridges with bolster beneath the knees. began working on step-ups with 2 inch step, which she had mild difficulty performing with the R LE compared bil.   PT Next Visit Plan continue right knee strength, mobiliity, CKC strengthening if tolerated.    PT Home Exercise Plan HEP review   Consulted and Agree with Plan of Care Patient        Problem List Patient Active Problem List   Diagnosis Date Noted  . Bilateral lower extremity edema 04/08/2015  . Urinary incontinence 04/08/2015  . Dizziness 01/28/2015  . Chronic knee pain 12/07/2014  . Change in bowel habits   . Benign neoplasm of rectum   .  Frequent loose stools    . Right-sided back pain 05/14/2014  . Fecal incontinence 05/06/2014   Starr Lake PT, DPT, LAT, ATC  04/24/2015  5:21 PM    [facility] [address] [place], [place], [address] Phone: [phone]   Fax:  [phone]

## 2015-04-25 NOTE — Telephone Encounter (Signed)
Form completed and placed up front. Patient to pick up.   Rosemarie Ax, MD PGY-3, [facility] 04/25/2015, 2:11 PM

## 2015-04-29 ENCOUNTER — Ambulatory Visit: Payer: BLUE CROSS/BLUE SHIELD | Attending: Family Medicine | Admitting: Physical Therapy

## 2015-04-29 DIAGNOSIS — R269 Unspecified abnormalities of gait and mobility: Secondary | ICD-10-CM | POA: Diagnosis present

## 2015-04-29 DIAGNOSIS — M25562 Pain in left knee: Secondary | ICD-10-CM | POA: Insufficient documentation

## 2015-04-29 DIAGNOSIS — M25561 Pain in right knee: Secondary | ICD-10-CM | POA: Diagnosis present

## 2015-04-29 DIAGNOSIS — R293 Abnormal posture: Secondary | ICD-10-CM | POA: Diagnosis present

## 2015-04-29 DIAGNOSIS — R29898 Other symptoms and signs involving the musculoskeletal system: Secondary | ICD-10-CM | POA: Diagnosis present

## 2015-04-29 NOTE — Therapy (Signed)
[place], [place], [address] Phone: [phone]   Fax:  [phone]  Physical Therapy Treatment  Patient Details  Name: Elizabeth Diaz MRN: [id number] Date of Birth: 09/13/1966 Referring Provider:  Rosemarie Ax, MD  Encounter Date: 04/29/2015      PT End of Session - 04/29/15 1755    Visit Number 11   Number of Visits 17   Date for PT Re-Evaluation 05/20/15   PT Start Time 1630   PT Stop Time 6712   PT Time Calculation (min) 45 min   Activity Tolerance Patient tolerated treatment well   Behavior During Therapy [facility] for tasks assessed/performed      Past Medical History  Diagnosis Date  . Pneumothorax   . Arthritis   . Seizures     hx of, no seizures last few years  . Numbness     left leg  . Hyperplastic colon polyp   . Diverticulosis     Past Surgical History  Procedure Laterality Date  . Chest tube insertion    . Tubal ligation    . Colonoscopy with propofol N/A 11/01/2014    Procedure: COLONOSCOPY WITH PROPOFOL;  Surgeon: Jerene Bears, MD;  Location: [facility];  Service: Gastroenterology;  Laterality: N/A;    There were no vitals filed for this visit.  Visit Diagnosis:  Bilateral knee pain  Leg weakness, bilateral  Abnormality of gait  Posture abnormality      Subjective Assessment - 04/29/15 1639    Subjective "both my knees are bothering me since the last Friday and the pain has been getting worse" pt reports that both knees are giving her a hard time and reports pain as a "25/10"   Currently in Pain? Yes   Pain Score 10-Worst pain ever   Pain Location Knee   Pain Orientation Right;Left   Pain Descriptors / Indicators Aching   Pain Type Chronic pain   Pain Onset More than a month ago   Pain Frequency Constant   Aggravating Factors  standing, walking   Pain Relieving Factors laying down, tramadol                         [facility] Adult PT  Treatment/Exercise - 04/29/15 1644    Knee/Hip Exercises: Aerobic   Stationary Bike Nu Step L1 x 10 min   Knee/Hip Exercises: Standing   Heel Raises Both;2 sets;10 reps   Knee/Hip Exercises: Seated   Long Arc Quad Strengthening;Right;2 sets;15 reps  no weight due to increased pain today   Other Seated Knee/Hip Exercises seated hamstring curl with red band x 10, seated marching 2 x 15 with 2#   Knee/Hip Exercises: Supine   Quad Sets 5 reps   Heel Slides AROM;AAROM;Right;1 set;5 reps   Bridges AROM;Strengthening;Both;10 reps;1 set  with bolster be   Straight Leg Raises 2 sets;5 reps   Iontophoresis   Type of Iontophoresis Dexamethasone   Location Rt., knee   Dose 1cc   Time 6 hour stat patch                PT Education - 04/29/15 1755    Education provided Yes   Education Details HEP review   Person(s) Educated Patient   Methods Explanation   Comprehension Verbalized understanding          PT Short Term Goals - 04/22/15 1638    PT SHORT TERM GOAL #1   Title  pt will be I with basic HEP (03/29/2015)   Time 4   Period Weeks   Status Achieved   PT SHORT TERM GOAL #2   Title pt will increase bil knee AROM by > 10 degrees to assist with funcitonal exercise progression (03/29/2015)   Time 4   Period Weeks   Status Achieved   PT SHORT TERM GOAL #3   Title she will increase bil knee strength to >4-/5 with < 6/10 pain to help promote functional strength and endurance (03/29/2015)   Baseline 3+/5 in R knee secondary to pain during testing   Time 4   Period Weeks   Status Partially Met   PT SHORT TERM GOAL #4   Title She will be able to verbalize and demonsrate techniques to reduce bil knee inflammation via RICE method (03/29/2015)   Time 4   Period Weeks   Status Achieved   PT SHORT TERM GOAL #5   Title She will increase her FOTO score by > 5 points to assist with increaed functional capacity (03/29/2015)   Time 4   Period Weeks   Status Achieved           PT Long  Term Goals - 04/22/15 1639    PT LONG TERM GOAL #1   Title pt will be I with advanced HEP progression as of last exercises given upon discharge (04/29/2015)   Time 8   Period Weeks   Status On-going   PT LONG TERM GOAL #2   Title pt will be able to tolerated standing/walking for > 10 min with LRAD for safety to assist with funcitonal community ambulation. (04/29/2015)   Time 8   Period Weeks   Status Achieved   PT LONG TERM GOAL #3   Title She will increase bil knee AROM to [facility] to assist with gait efficicney and safety to progress (04/29/2015)   Time 8   Period Weeks   Status Partially Met   PT LONG TERM GOAL #4   Title She will Increase bil knee strength to > 4/5 to help with safety during weight bearing activities and funcitonal gait/edurance (04/29/2015)   Time 8   Period Weeks   Status Partially Met   PT LONG TERM GOAL #5   Title She will increase her FOTO score to > 50  to assist with increased functional capacity upon discharge (04/29/2015)   Time 8   Period Weeks   Status On-going               Plan - 04/29/15 1755    Clinical Impression Statement Seylah states she is increased pain today due to have to walk/stand more than usual. She presents today without her RW and is demonstrating an incresed antalgic gait pattern. She was able to complete all  exercises except for hamstring curls on her RLE due to increased pain during the activity. Attempted to utilize he to help calm down and reduce any additional swelling present today which seemed to help some.    PT Next Visit Plan continue right knee strength, mobiliity, CKC strengthening if tolerated.    PT Home Exercise Plan HEP review   Consulted and Agree with Plan of Care Patient        Problem List Patient Active Problem List   Diagnosis Date Noted  . Bilateral lower extremity edema 04/08/2015  . Urinary incontinence 04/08/2015  . Dizziness 01/28/2015  . Chronic knee pain 12/07/2014  . Change in bowel habits   .  Benign neoplasm of rectum   . Frequent loose stools   . Right-sided back pain 05/14/2014  . Fecal incontinence 05/06/2014   Starr Lake PT, DPT, LAT, ATC  04/29/2015  5:58 PM    [place] [facility] [facility] [address] [place], [place], [address] Phone: [phone]   Fax:  [phone]

## 2015-05-01 ENCOUNTER — Ambulatory Visit: Payer: BLUE CROSS/BLUE SHIELD | Admitting: Physical Therapy

## 2015-05-01 DIAGNOSIS — R29898 Other symptoms and signs involving the musculoskeletal system: Secondary | ICD-10-CM

## 2015-05-01 DIAGNOSIS — M25561 Pain in right knee: Secondary | ICD-10-CM | POA: Diagnosis not present

## 2015-05-01 DIAGNOSIS — M25562 Pain in left knee: Principal | ICD-10-CM

## 2015-05-01 DIAGNOSIS — R269 Unspecified abnormalities of gait and mobility: Secondary | ICD-10-CM

## 2015-05-01 DIAGNOSIS — R293 Abnormal posture: Secondary | ICD-10-CM

## 2015-05-01 NOTE — Therapy (Signed)
[place], [place], [address] Phone: [phone]   Fax:  [phone]  Physical Therapy Treatment  Patient Details  Name: Elizabeth Diaz MRN: [id number] Date of Birth: September 24, 1966 Referring Provider:  Rosemarie Ax, MD  Encounter Date: 05/01/2015      PT End of Session - 05/01/15 1705    Visit Number 12   Number of Visits 17   Date for PT Re-Evaluation 05/20/15   PT Start Time 1630   PT Stop Time 1725   PT Time Calculation (min) 55 min   Activity Tolerance Patient tolerated treatment well   Behavior During Therapy [facility] for tasks assessed/performed      Past Medical History  Diagnosis Date  . Pneumothorax   . Arthritis   . Seizures     hx of, no seizures last few years  . Numbness     left leg  . Hyperplastic colon polyp   . Diverticulosis     Past Surgical History  Procedure Laterality Date  . Chest tube insertion    . Tubal ligation    . Colonoscopy with propofol N/A 11/01/2014    Procedure: COLONOSCOPY WITH PROPOFOL;  Surgeon: Jerene Bears, MD;  Location: [facility];  Service: Gastroenterology;  Laterality: N/A;    There were no vitals filed for this visit.  Visit Diagnosis:  Bilateral knee pain  Leg weakness, bilateral  Abnormality of gait  Posture abnormality      Subjective Assessment - 05/01/15 1643    Subjective "I am really having trouble with both of my knees today"   Currently in Pain? Yes   Pain Score 9    Pain Location Knee   Pain Orientation Right;Left   Pain Descriptors / Indicators Aching;Sharp;Sore   Pain Type Chronic pain   Pain Onset More than a month ago   Pain Frequency Constant   Aggravating Factors  standing, walking   Pain Relieving Factors laying down, resting, tramadol                         [facility] Adult PT Treatment/Exercise - 05/01/15 0001    Self-Care   Self-Care Other Self-Care Comments   Other Self-Care Comments   performing walking in the pool to allow the natural buoyancy of the water to help relieve some tension/ weight on the knee to allow for more activity.    Knee/Hip Exercises: Aerobic   Stationary Bike Nu Step L4 x 10 min   Knee/Hip Exercises: Seated   Long Arc Quad Strengthening;Right;2 sets;15 reps   Other Seated Knee/Hip Exercises seated hamstring curl with red band x 10, seated marching 2 x 15 with 2#   Knee/Hip Exercises: Supine   Quad Sets 5 reps   Heel Slides AROM;AAROM;Right;2 sets;10 reps  bil   Cryotherapy   Number Minutes Cryotherapy 10 Minutes   Cryotherapy Location Knee   Type of Cryotherapy Ice pack  bil   Iontophoresis   Type of Iontophoresis --   Location --   Dose --   Time --   Manual Therapy   Joint Mobilization grade 1 knee mobilization P<>A and patellar mobilizations  to help decrease pain   Soft tissue mobilization instrument STM over the bil quads, and trigger point release of the lateral and vastus muscles                PT Education - 05/01/15 1729    Education provided Yes  Education Details benefit of walking in water to alleviate pressure on the knee   Person(s) Educated Patient   Methods Explanation   Comprehension Verbalized understanding          PT Short Term Goals - 05/01/15 1731    PT SHORT TERM GOAL #1   Title pt will be I with basic HEP (03/29/2015)   Time 4   Period Weeks   Status Achieved   PT SHORT TERM GOAL #2   Title pt will increase bil knee AROM by > 10 degrees to assist with funcitonal exercise progression (03/29/2015)   Time 4   Period Weeks   Status Achieved   PT SHORT TERM GOAL #3   Title she will increase bil knee strength to >4-/5 with < 6/10 pain to help promote functional strength and endurance (03/29/2015)   Baseline 3+/5 in R knee secondary to pain during testing   Time 4   Period Weeks   Status Partially Met   PT SHORT TERM GOAL #4   Title She will be able to verbalize and demonsrate techniques to reduce  bil knee inflammation via RICE method (03/29/2015)   Time 4   Period Weeks   Status Achieved   PT SHORT TERM GOAL #5   Title She will increase her FOTO score by > 5 points to assist with increaed functional capacity (03/29/2015)   Time 4   Period Weeks   Status Achieved           PT Long Term Goals - 05/01/15 1731    PT LONG TERM GOAL #1   Title pt will be I with advanced HEP progression as of last exercises given upon discharge (04/29/2015)   Time 8   Period Weeks   Status On-going   PT LONG TERM GOAL #2   Title pt will be able to tolerated standing/walking for > 10 min with LRAD for safety to assist with funcitonal community ambulation. (04/29/2015)   Time 8   Period Weeks   Status Achieved   PT LONG TERM GOAL #3   Title She will increase bil knee AROM to [facility] to assist with gait efficicney and safety to progress (04/29/2015)   Time 8   Period Weeks   Status Partially Met   PT LONG TERM GOAL #4   Title She will Increase bil knee strength to > 4/5 to help with safety during weight bearing activities and funcitonal gait/edurance (04/29/2015)   Time 8   Period Weeks   Status Partially Met   PT LONG TERM GOAL #5   Title She will increase her FOTO score to > 50  to assist with increased functional capacity upon discharge (04/29/2015)   Time 8   Period Weeks   Status On-going               Plan - 05/01/15 1705    Clinical Impression Statement Krisalyn continues to demonstrate increased pain in the R knee and pain beging to gradually get worse in her left knee. She arrived walking with her RW, which she states really helps her at home. Attempted to perform instrument asissted STM over bil quads and trigger point release. she reported some relief but continues to have 9/10 pain. Spoke with pt that we haven't seen much improvement with her current level of function following the last 12 visits and infact her pain seems be gradually worsening and moving to her L knee. Educated that if she  continues to demonstrate no functional  improvement or decreased pain than we may have exhausted the benefits of physical therapy and she may have to return to the doctor for further assessment and other options. pt agreed with this.    PT Next Visit Plan continue right knee strength, mobiliity, CKC strengthening if tolerated, assess instrument assisted STM response   PT Home Exercise Plan walking in the pool   Consulted and Agree with Plan of Care Patient        Problem List Patient Active Problem List   Diagnosis Date Noted  . Bilateral lower extremity edema 04/08/2015  . Urinary incontinence 04/08/2015  . Dizziness 01/28/2015  . Chronic knee pain 12/07/2014  . Change in bowel habits   . Benign neoplasm of rectum   . Frequent loose stools   . Right-sided back pain 05/14/2014  . Fecal incontinence 05/06/2014   Starr Lake PT, DPT, LAT, ATC  05/01/2015  5:35 PM    [place], [place], [address] Phone: [phone]   Fax:  [phone]

## 2015-05-02 ENCOUNTER — Telehealth: Payer: Self-pay | Admitting: Family Medicine

## 2015-05-02 NOTE — Telephone Encounter (Signed)
FMLA paperwork was filled out for pt; employer calling to retrieve information regarding pt visits on form. The department to reach is at [phone] "option 2". Please contact, thank you, Fonda Kinder, ASA

## 2015-05-03 NOTE — Telephone Encounter (Signed)
We do not have ROI to contact employer about office visits.  Called pt and relayed the following.  She is very appreciative for the call and she is going to call the number and see what they need, she will then come by and complete ROI if still needed. Elizabeth Diaz, [name]

## 2015-05-06 ENCOUNTER — Ambulatory Visit: Payer: BLUE CROSS/BLUE SHIELD

## 2015-05-06 DIAGNOSIS — M25562 Pain in left knee: Principal | ICD-10-CM

## 2015-05-06 DIAGNOSIS — R29898 Other symptoms and signs involving the musculoskeletal system: Secondary | ICD-10-CM

## 2015-05-06 DIAGNOSIS — R269 Unspecified abnormalities of gait and mobility: Secondary | ICD-10-CM

## 2015-05-06 DIAGNOSIS — M25561 Pain in right knee: Secondary | ICD-10-CM | POA: Diagnosis not present

## 2015-05-06 NOTE — Therapy (Signed)
Elizabeth Diaz, [place], [address] Phone: [phone]   Fax:  [phone]  Physical Therapy Treatment  Patient Details  Name: JOYLEEN Diaz MRN: [id number] Date of Birth: [birth year] Referring Provider:  Rosemarie Ax, MD  Encounter Date: 05/06/2015      PT End of Session - 05/06/15 1712    Visit Number 13   Number of Visits 17   Date for PT Re-Evaluation 05/20/15   PT Start Time 0430   PT Stop Time 0525   PT Time Calculation (min) 55 min   Activity Tolerance Patient tolerated treatment well   Behavior During Therapy [facility] for tasks assessed/performed      Past Medical History  Diagnosis Date  . Pneumothorax   . Arthritis   . Seizures     hx of, no seizures last few years  . Numbness     left leg  . Hyperplastic colon polyp   . Diverticulosis     Past Surgical History  Procedure Laterality Date  . Chest tube insertion    . Tubal ligation    . Colonoscopy with propofol N/A 11/01/2014    Procedure: COLONOSCOPY WITH PROPOFOL;  Surgeon: Jerene Bears, MD;  Location: [facility];  Service: Gastroenterology;  Laterality: N/A;    There were no vitals filed for this visit.  Visit Diagnosis:  Bilateral knee pain  Leg weakness, bilateral  Abnormality of gait      Subjective Assessment - 05/06/15 1635    Subjective im doing well today. REsted off feet last day and had a leg massage. Feels better today. Waiting for injections of gel but i don't know when i'll get them.    Currently in Pain? Yes   Pain Score 4    Pain Location Knee   Pain Orientation Right;Left   Pain Descriptors / Indicators Aching;Sharp   Pain Type Chronic pain   Pain Onset More than a month ago   Pain Frequency Constant   Aggravating Factors  Being on feet   Pain Relieving Factors resting off feet   Multiple Pain Sites No                         [facility] Adult PT Treatment/Exercise - 05/06/15 1642    Knee/Hip Exercises: Aerobic   Stationary Bike Nu Step L4 x 8 min   Knee/Hip Exercises: Standing   Functional Squat 1 set;10 reps   Functional Squat Limitations hip hige sit to stand   Knee/Hip Exercises: Seated   Long Arc Quad Strengthening;Right;Left;2 sets;15 reps   Long Arc Quad Weight 3 lbs.   Long [employer] Limitations 5 sec hold   Other Seated Knee/Hip Exercises seated hamstring curl with red band x 20, seated marching 2 x 15 with 3#   Knee/Hip Exercises: Supine   Short Arc Quad Sets Strengthening;Right;Left;1 set;15 reps  with QS and pressure into bolster.    Cryotherapy   Number Minutes Cryotherapy 12 Minutes   Cryotherapy Location Knee   Type of Cryotherapy Ice pack                  PT Short Term Goals - 05/01/15 1731    PT SHORT TERM GOAL #1   Title pt will be I with basic HEP (03/29/2015)   Time 4   Period Weeks   Status Achieved   PT SHORT TERM GOAL #2   Title pt will increase bil knee AROM by >  10 degrees to assist with funcitonal exercise progression (03/29/2015)   Time 4   Period Weeks   Status Achieved   PT SHORT TERM GOAL #3   Title she will increase bil knee strength to >4-/5 with < 6/10 pain to help promote functional strength and endurance (03/29/2015)   Baseline 3+/5 in R knee secondary to pain during testing   Time 4   Period Weeks   Status Partially Met   PT SHORT TERM GOAL #4   Title She will be able to verbalize and demonsrate techniques to reduce bil knee inflammation via RICE method (03/29/2015)   Time 4   Period Weeks   Status Achieved   PT SHORT TERM GOAL #5   Title She will increase her FOTO score by > 5 points to assist with increaed functional capacity (03/29/2015)   Time 4   Period Weeks   Status Achieved           PT Long Term Goals - 05/01/15 1731    PT LONG TERM GOAL #1   Title pt will be I with advanced HEP progression as of last exercises given upon discharge (04/29/2015)   Time 8   Period Weeks   Status On-going   PT LONG  TERM GOAL #2   Title pt will be able to tolerated standing/walking for > 10 min with LRAD for safety to assist with funcitonal community ambulation. (04/29/2015)   Time 8   Period Weeks   Status Achieved   PT LONG TERM GOAL #3   Title She will increase bil knee AROM to [facility] to assist with gait efficicney and safety to progress (04/29/2015)   Time 8   Period Weeks   Status Partially Met   PT LONG TERM GOAL #4   Title She will Increase bil knee strength to > 4/5 to help with safety during weight bearing activities and funcitonal gait/edurance (04/29/2015)   Time 8   Period Weeks   Status Partially Met   PT LONG TERM GOAL #5   Title She will increase her FOTO score to > 50  to assist with increased functional capacity upon discharge (04/29/2015)   Time 8   Period Weeks   Status On-going               Plan - 05/06/15 1712    Clinical Impression Statement She was better today nad was able to handle weight without sognificant incr pain.,  she was able to do the hip hinge from mat but this  gave her some sharp paind so after 10 reps wie stopped   PT Next Visit Plan continue right knee strength, mobiliity, CKC strengthening if tolerated, assess instrument assisted STM response   Consulted and Agree with Plan of Care Patient        Problem List Patient Active Problem List   Diagnosis Date Noted  . Bilateral lower extremity edema 04/08/2015  . Urinary incontinence 04/08/2015  . Dizziness 01/28/2015  . Chronic knee pain 12/07/2014  . Change in bowel habits   . Benign neoplasm of rectum   . Frequent loose stools   . Right-sided back pain 05/14/2014  . Fecal incontinence 05/06/2014    Darrel Hoover PT 05/06/2015, 5:14 PM  [facility] [address] [place], [place], [address] Phone: [phone]   Fax:  [phone]

## 2015-05-08 ENCOUNTER — Ambulatory Visit: Payer: BLUE CROSS/BLUE SHIELD | Admitting: Physical Therapy

## 2015-05-09 ENCOUNTER — Other Ambulatory Visit: Payer: Self-pay | Admitting: Family Medicine

## 2015-05-09 DIAGNOSIS — G8929 Other chronic pain: Secondary | ICD-10-CM

## 2015-05-09 DIAGNOSIS — M25561 Pain in right knee: Secondary | ICD-10-CM

## 2015-05-09 DIAGNOSIS — M25562 Pain in left knee: Principal | ICD-10-CM

## 2015-05-09 NOTE — Telephone Encounter (Signed)
Tramadol refill request 

## 2015-05-13 ENCOUNTER — Other Ambulatory Visit: Payer: Self-pay | Admitting: Family Medicine

## 2015-05-13 DIAGNOSIS — M25561 Pain in right knee: Secondary | ICD-10-CM

## 2015-05-13 DIAGNOSIS — G8929 Other chronic pain: Secondary | ICD-10-CM

## 2015-05-13 DIAGNOSIS — M25562 Pain in left knee: Principal | ICD-10-CM

## 2015-05-13 MED ORDER — TRAMADOL HCL 50 MG PO TABS: 50.0000 mg | ORAL_TABLET | Freq: Four times a day (QID) | ORAL | Status: DC | PRN

## 2015-05-13 NOTE — Telephone Encounter (Signed)
Tramadol called in.   Rosemarie Ax, MD PGY-3, [facility] 05/13/2015, 8:29 PM

## 2015-05-15 ENCOUNTER — Ambulatory Visit: Payer: BLUE CROSS/BLUE SHIELD | Admitting: Family Medicine

## 2015-05-20 ENCOUNTER — Ambulatory Visit: Payer: BLUE CROSS/BLUE SHIELD | Admitting: Physical Therapy

## 2015-05-20 DIAGNOSIS — R29898 Other symptoms and signs involving the musculoskeletal system: Secondary | ICD-10-CM

## 2015-05-20 DIAGNOSIS — R269 Unspecified abnormalities of gait and mobility: Secondary | ICD-10-CM

## 2015-05-20 DIAGNOSIS — R293 Abnormal posture: Secondary | ICD-10-CM

## 2015-05-20 DIAGNOSIS — M25561 Pain in right knee: Secondary | ICD-10-CM

## 2015-05-20 DIAGNOSIS — M25562 Pain in left knee: Principal | ICD-10-CM

## 2015-05-20 NOTE — Therapy (Signed)
[place], [place], [address] Phone: [phone]   Fax:  [phone]  Physical Therapy Treatment  Patient Details  Name: Elizabeth Diaz MRN: [id number] Date of Birth: 04/18/1966 Referring Provider:  Rosemarie Ax, MD  Encounter Date: 05/20/2015      PT End of Session - 05/20/15 1430    Visit Number 14   Number of Visits 17   Date for PT Re-Evaluation 05/20/15   PT Start Time 1414   PT Stop Time 1500   PT Time Calculation (min) 46 min   Activity Tolerance Patient limited by pain   Behavior During Therapy [facility] for tasks assessed/performed      Past Medical History  Diagnosis Date  . Pneumothorax   . Arthritis   . Seizures     hx of, no seizures last few years  . Numbness     left leg  . Hyperplastic colon polyp   . Diverticulosis     Past Surgical History  Procedure Laterality Date  . Chest tube insertion    . Tubal ligation    . Colonoscopy with propofol N/A 11/01/2014    Procedure: COLONOSCOPY WITH PROPOFOL;  Surgeon: Jerene Bears, MD;  Location: [facility];  Service: Gastroenterology;  Laterality: N/A;    There were no vitals filed for this visit.  Visit Diagnosis:  Bilateral knee pain  Leg weakness, bilateral  Abnormality of gait  Posture abnormality      Subjective Assessment - 05/20/15 1427    Subjective pt reports that she will be getting her 3rd bout of injections in her knee on 05/23/2015. She states that she is doing any better with the injections and just wants her physician to replace her knee   Currently in Pain? Yes   Pain Score 5    Pain Location Knee   Pain Orientation Right;Left   Pain Descriptors / Indicators Sharp;Aching   Pain Type Chronic pain   Pain Onset More than a month ago   Pain Frequency Constant   Aggravating Factors  being on the feet, laying down, and movement   Pain Relieving Factors N/A            [facility] PT Assessment - 05/20/15 0001     Observation/Other Assessments   Focus on Therapeutic Outcomes (FOTO)  76% limited   AROM   Right Knee Extension -3   Right Knee Flexion 108   Left Knee Extension 0   Left Knee Flexion 128   Strength   Right Hip Flexion 4-/5   Right Hip Extension 4-/5   Right Hip ABduction 4+/5   Right Hip ADduction 4+/5   Right Knee Flexion 3+/5  pain during testing   Right Knee Extension 3+/5  pain during testing   Left Knee Flexion 5/5   Left Knee Extension 5/5                               PT Short Term Goals - 05/20/15 1517    PT SHORT TERM GOAL #1   Title pt will be I with basic HEP (03/29/2015)   Time 4   Period Weeks   Status Achieved   PT SHORT TERM GOAL #2   Title pt will increase bil knee AROM by > 10 degrees to assist with funcitonal exercise progression (03/29/2015)   Time 4   Period Weeks   Status Achieved   PT SHORT  TERM GOAL #3   Title she will increase bil knee strength to >4-/5 with < 6/10 pain to help promote functional strength and endurance (03/29/2015)   Baseline 3+/5 in R knee secondary to pain during testing   Time 4   Period Weeks   Status Partially Met   PT SHORT TERM GOAL #4   Title She will be able to verbalize and demonsrate techniques to reduce bil knee inflammation via RICE method (03/29/2015)   Period Weeks   Status Achieved   PT SHORT TERM GOAL #5   Title She will increase her FOTO score by > 5 points to assist with increaed functional capacity (03/29/2015)   Time 4   Period Weeks   Status Achieved           PT Long Term Goals - 05/20/15 1517    PT LONG TERM GOAL #1   Title pt will be I with advanced HEP progression as of last exercises given upon discharge (04/29/2015)   Time 8   Period Weeks   Status Partially Met   PT LONG TERM GOAL #2   Title pt will be able to tolerated standing/walking for > 10 min with LRAD for safety to assist with funcitonal community ambulation. (04/29/2015)   Time 8   Period Weeks   Status Achieved    PT LONG TERM GOAL #3   Title She will increase bil knee AROM to [facility] to assist with gait efficicney and safety to progress (04/29/2015)   Time 8   Period Weeks   Status Partially Met   PT LONG TERM GOAL #4   Title She will Increase bil knee strength to > 4/5 to help with safety during weight bearing activities and funcitonal gait/edurance (04/29/2015)   Time 8   Period Weeks   Status Partially Met   PT LONG TERM GOAL #5   Title She will increase her FOTO score to > 50  to assist with increased functional capacity upon discharge (04/29/2015)   Time 8   Period Weeks   Status On-going               Plan - 05/20/15 1513    Clinical Impression Statement Lorianna reports to therapy today stating she has gotten some shots in the knee of the hylagan and has noticed no changes in her function or pain. No new goals have been met. she continues to required assistance of a [facility] for stability due to pain. She has made no progress with AROM or strength since the last assessment. Due to lack of progress and continued pain educated that she may need to return to her physician for further assessment and options for pain control, pt agreed with this and reported she plans to discuss with her Dr what her options are.  pt will be discharged from pt Today.    PT Next Visit Plan discharge   PT Home Exercise Plan HEP review   Consulted and Agree with Plan of Care Patient        Problem List Patient Active Problem List   Diagnosis Date Noted  . Bilateral lower extremity edema 04/08/2015  . Urinary incontinence 04/08/2015  . Dizziness 01/28/2015  . Chronic knee pain 12/07/2014  . Change in bowel habits   . Benign neoplasm of rectum   . Frequent loose stools   . Right-sided back pain 05/14/2014  . Fecal incontinence 05/06/2014   Starr Lake PT, DPT, LAT, ATC  05/20/2015  3:19 PM  [place], [place],  [address] Phone: [phone]   Fax:  [phone]                         PHYSICAL THERAPY DISCHARGE SUMMARY  Visits from Start of Care: 14  Current functional level related to goals / functional outcomes: FOTO 76% limited   Remaining deficits: See goals   Education / Equipment: HEP   Plan: Patient agrees to discharge.  Patient goals were partially met. Patient is being discharged due to lack of progress.  ?????

## 2015-05-22 MED ORDER — CYCLOBENZAPRINE HCL 10 MG PO TABS: 5.0000 mg | ORAL_TABLET | Freq: Three times a day (TID) | ORAL | Status: DC | PRN

## 2015-05-22 MED ORDER — GABAPENTIN 100 MG PO CAPS: 100.0000 mg | ORAL_CAPSULE | Freq: Three times a day (TID) | ORAL | Status: DC

## 2015-05-22 NOTE — Telephone Encounter (Signed)
Needs flexeril and gabapentin refilled and sent to the [facility] on [address]

## 2015-05-27 ENCOUNTER — Ambulatory Visit: Payer: BLUE CROSS/BLUE SHIELD | Admitting: Family Medicine

## 2015-06-10 ENCOUNTER — Ambulatory Visit (INDEPENDENT_AMBULATORY_CARE_PROVIDER_SITE_OTHER): Payer: BLUE CROSS/BLUE SHIELD | Admitting: Family Medicine

## 2015-06-10 ENCOUNTER — Encounter: Payer: Self-pay | Admitting: Family Medicine

## 2015-06-10 VITALS — BP 112/49 | HR 82 | Temp 98.5°F | Ht 60.0 in | Wt 268.0 lb

## 2015-06-10 DIAGNOSIS — M25562 Pain in left knee: Secondary | ICD-10-CM

## 2015-06-10 DIAGNOSIS — M25561 Pain in right knee: Secondary | ICD-10-CM

## 2015-06-10 DIAGNOSIS — R7309 Other abnormal glucose: Secondary | ICD-10-CM

## 2015-06-10 DIAGNOSIS — Z23 Encounter for immunization: Secondary | ICD-10-CM

## 2015-06-10 DIAGNOSIS — Z0000 Encounter for general adult medical examination without abnormal findings: Secondary | ICD-10-CM

## 2015-06-10 DIAGNOSIS — R7303 Prediabetes: Secondary | ICD-10-CM

## 2015-06-10 DIAGNOSIS — E119 Type 2 diabetes mellitus without complications: Secondary | ICD-10-CM

## 2015-06-10 DIAGNOSIS — G8929 Other chronic pain: Secondary | ICD-10-CM

## 2015-06-10 LAB — CBC
HCT: 38.6 % (ref 36.0–46.0)
HEMOGLOBIN: 12.8 g/dL (ref 12.0–15.0)
MCH: 30.1 pg (ref 26.0–34.0)
MCHC: 33.2 g/dL (ref 30.0–36.0)
MCV: 90.8 fL (ref 78.0–100.0)
MPV: 10.8 fL (ref 8.6–12.4)
Platelets: 301 10*3/uL (ref 150–400)
RBC: 4.25 MIL/uL (ref 3.87–5.11)
RDW: 13.7 % (ref 11.5–15.5)
WBC: 9.2 10*3/uL (ref 4.0–10.5)

## 2015-06-10 LAB — LIPID PANEL
Cholesterol: 148 mg/dL (ref 125–200)
HDL: 45 mg/dL — AB (ref >=46)
LDL Cholesterol: 74 mg/dL (ref <130)
TRIGLYCERIDES: 143 mg/dL (ref <150)
Total CHOL/HDL Ratio: 3.3 Ratio (ref <=5.0)
VLDL: 29 mg/dL (ref <30)

## 2015-06-10 LAB — BASIC METABOLIC PANEL WITH GFR
BUN: 9 mg/dL (ref 7–25)
CHLORIDE: 104 mmol/L (ref 98–110)
CO2: 27 mmol/L (ref 20–31)
Calcium: 8 mg/dL — ABNORMAL LOW (ref 8.6–10.2)
Creat: 0.63 mg/dL (ref 0.50–1.10)
GFR, Est African American: >89 mL/min (ref >=60)
GFR, Est Non African American: >89 mL/min (ref >=60)
Glucose, Bld: 94 mg/dL (ref 65–99)
POTASSIUM: 4.2 mmol/L (ref 3.5–5.3)
SODIUM: 139 mmol/L (ref 135–146)

## 2015-06-10 LAB — POCT GLYCOSYLATED HEMOGLOBIN (HGB A1C): HEMOGLOBIN A1C: 6

## 2015-06-10 LAB — TSH: TSH: 2.163 u[IU]/mL (ref 0.350–4.500)

## 2015-06-10 MED ORDER — TRAMADOL HCL 50 MG PO TABS: 50.0000 mg | ORAL_TABLET | Freq: Four times a day (QID) | ORAL | Status: DC | PRN

## 2015-06-10 MED ORDER — GABAPENTIN 300 MG PO CAPS: 300.0000 mg | ORAL_CAPSULE | Freq: Three times a day (TID) | ORAL | Status: DC

## 2015-06-10 NOTE — Assessment & Plan Note (Signed)
Overweight but hard for her to exercise with knee pain  She wanted to defer Pap today as she felt uncomfortable  Labs today  Given sheet for ophthalmology  Tdap done today  Declined flu and PNA vaccine

## 2015-06-10 NOTE — Progress Notes (Signed)
Subjective:    Elizabeth Diaz - 49 y.o. female MRN [id number]  Date of birth: May 11, 1966  HPI  Elizabeth Diaz is here for annual physical.  Annual Gynecological Exam  [id number]  Wt Readings from Last 3 Encounters:  06/10/15 268 lb (121.564 kg)  04/19/15 261 lb 8 oz (118.616 kg)  04/08/15 267 lb (121.11 kg)   Last period: 09/09/2016Current.   Regular periods: irregular Varies from 3-7 days.  Heavy bleeding: yes. Using 5-6 pads per day. Normal is 3-4 per day   Sexually active: no Birth control or hormonal therapy: no Hx of STD: no Dyspareunia: No Hot flashes: yes, mostly during the day  Vaginal discharge: normal for her  Dysuria: No   Last mammogram: last 2016  Breast mass or concerns: No Last Pap: greater than 3 years.   History of abnormal pap: No  FH of breast, uterine, ovarian, colon cancer: No  B/L knee pain:  She has been discharged.  PT has not helped.  She is now having pain in her hips, back and now having more pain in her right knee Needs refill of tramadol.  Has been seen at [facility] and received hydralronic acid injections with no improvement.  Her right knee doesn't lock in place but has given out from time to time.  She has been awoken out of her sleep from pain.  She has been placed out of work until Sept 30.  Short term disability runs out in January 2017 She is doing knee exercises.  Taking tramadol 50 mg 2 pills every 6 hours PRN.  Prediabetes:  She tries to eat correctly  Does some exercise in the water  Denies any polydipsia or polyuria    Health Maintenance:  Health Maintenance Due  Topic Date Due  . PNEUMOCOCCAL POLYSACCHARIDE VACCINE (1) 12/27/1967  . FOOT EXAM  12/27/1975  . OPHTHALMOLOGY EXAM  12/27/1975  . URINE MICROALBUMIN  12/27/1975  . PAP SMEAR  12/27/1986  . HEMOGLOBIN A1C  01/22/2015  . INFLUENZA VACCINE  04/29/2015    -  reports that she quit smoking about 6 years ago. Her smoking use included Cigarettes. She has a  30 pack-year smoking history. She has never used smokeless tobacco. - Review of Systems: Per HPI. - Past Medical History: Patient Active Problem List   Diagnosis Date Noted  . Prediabetes 06/10/2015  . Annual physical exam 06/10/2015  . Bilateral lower extremity edema 04/08/2015  . Urinary incontinence 04/08/2015  . Dizziness 01/28/2015  . Chronic knee pain 12/07/2014  . Change in bowel habits   . Benign neoplasm of rectum   . Frequent loose stools   . Right-sided back pain 05/14/2014  . Fecal incontinence 05/06/2014   - Medications: reviewed and updated Current Outpatient Prescriptions  Medication Sig Dispense Refill  . cyclobenzaprine (FLEXERIL) 10 MG tablet Take 0.5-1 tablets (5-10 mg total) by mouth 3 (three) times daily as needed for muscle spasms. 60 tablet 0  . furosemide (LASIX) 40 MG tablet Take 1 tablet (40 mg total) by mouth daily as needed for edema. (Patient not taking: Reported on 04/08/2015) 30 tablet 0  . gabapentin (NEURONTIN) 300 MG capsule Take 1 capsule (300 mg total) by mouth 3 (three) times daily. 90 capsule 3  . Lido-Capsaicin-Men-Methyl Sal 0.5-0.035-5-20 % PTCH Apply 1 patch topically 2 (two) times daily as needed (pain). (Patient not taking: Reported on 04/08/2015) 30 patch 0  . traMADol (ULTRAM) 50 MG tablet Take 1 tablet (50 mg total) by mouth every  6 (six) hours as needed. 30 tablet 0   No current facility-administered medications for this visit.     Review of Systems See HPI     Objective:   Physical Exam BP 112/49 mmHg  Pulse 82  Temp(Src) 98.5 F (36.9 C) (Oral)  Ht 5' (1.524 m)  Wt 268 lb (121.564 kg)  BMI 52.34 kg/m2  LMP 06/07/2015 Gen: NAD, alert, cooperative with exam, morbidly obese  HEENT: NCAT, PERRL, clear conjunctiva,  CV: RRR, good S1/S2, no murmur, no edema, capillary refill brisk  Resp: CTABL, no wheezes, non-labored Skin: no rashes, normal turgor  Neuro: no gross deficits.  Psych:  alert and oriented    Assessment & Plan:     Prediabetes Controlled.  Improvement in A1c  - continue dietary changes  - exercises in water aerobics.    Chronic knee pain Followed by [facility]  No improvement with conservative measures (PT, steroid injections, medications)  Imaging shows mild OA changes  - refilled tramadol  - increased gabapentin. Had discussion about changing to cymbalta  - advised to use tylenol as the main medication and tramadol as the PRN instead of scheduled  - she feels that she cannot perform her job and they don't have any light duty activities for her. She would like to have a note for keeping out of work until January at which time she would like to have an evaluation for long term disability. Will provide note to keep out of work until January   - she may need to have the discussion about replacement with her ortho   Annual physical exam Overweight but hard for her to exercise with knee pain  She wanted to defer Pap today as she felt uncomfortable  Labs today  Given sheet for ophthalmology  Tdap done today  Declined flu and PNA vaccine

## 2015-06-10 NOTE — Assessment & Plan Note (Signed)
Followed by [facility]  No improvement with conservative measures (PT, steroid injections, medications)  Imaging shows mild OA changes  - refilled tramadol  - increased gabapentin. Had discussion about changing to cymbalta  - advised to use tylenol as the main medication and tramadol as the PRN instead of scheduled  - she feels that she cannot perform her job and they don't have any light duty activities for her. She would like to have a note for keeping out of work until January at which time she would like to have an evaluation for long term disability. Will provide note to keep out of work until January   - she may need to have the discussion about replacement with her ortho

## 2015-06-10 NOTE — Patient Instructions (Signed)
Thank you for coming in,   Increase the gabapentin.  Make sure that you are not driving the day after until you get use to the medication.   Please bring all of your medications with you to each visit.    Please feel free to call with any questions or concerns at any time, at [phone]. --Dr. Raeford Razor  Diet Recommendations for Pre-Diabetes   Starchy (carb) foods include: Bread, rice, pasta, potatoes, corn, crackers, bagels, muffins, all baked goods.   Protein foods include: Meat, fish, poultry, eggs, dairy foods, and beans such as pinto and kidney beans (beans also provide carbohydrate).   1. Eat at least 3 meals and 1-2 snacks per day. Never go more than 4-5 hours while awake without eating.  2. Limit starchy foods to TWO per meal and ONE per snack. ONE portion of a starchy  food is equal to the following:   - ONE slice of bread (or its equivalent, such as half of a hamburger bun).   - 1/2 cup of a "scoopable" starchy food such as potatoes or rice.   - 1 OUNCE (28 grams) of starchy snack foods such as crackers or pretzels (look on label).   - 15 grams of carbohydrate as shown on food label.  3. Both lunch and dinner should include a protein food, a carb food, and vegetables.   - Obtain twice as many veg's as protein or carbohydrate foods for both lunch and dinner.   - Try to keep frozen veg's on hand for a quick vegetable serving.     - Fresh or frozen veg's are best.  4. Breakfast should always include protein.

## 2015-06-10 NOTE — Assessment & Plan Note (Signed)
Controlled.  Improvement in A1c  - continue dietary changes  - exercises in water aerobics.

## 2015-06-11 ENCOUNTER — Encounter: Payer: Self-pay | Admitting: Family Medicine

## 2015-06-13 ENCOUNTER — Other Ambulatory Visit: Payer: Self-pay | Admitting: Family Medicine

## 2015-06-13 DIAGNOSIS — G8929 Other chronic pain: Secondary | ICD-10-CM

## 2015-06-13 DIAGNOSIS — M25561 Pain in right knee: Secondary | ICD-10-CM

## 2015-06-13 DIAGNOSIS — M25562 Pain in left knee: Principal | ICD-10-CM

## 2015-06-13 NOTE — Telephone Encounter (Signed)
Need refill on tramadol °

## 2015-06-17 ENCOUNTER — Encounter: Payer: Self-pay | Admitting: Family Medicine

## 2015-06-17 ENCOUNTER — Ambulatory Visit (INDEPENDENT_AMBULATORY_CARE_PROVIDER_SITE_OTHER): Payer: BLUE CROSS/BLUE SHIELD | Admitting: Family Medicine

## 2015-06-17 ENCOUNTER — Other Ambulatory Visit (HOSPITAL_COMMUNITY)
Admission: RE | Admit: 2015-06-17 | Discharge: 2015-06-17 | Disposition: A | Discharge status: Home / Self Care | Payer: BLUE CROSS/BLUE SHIELD | Source: Ambulatory Visit | Attending: Family Medicine | Admitting: Family Medicine

## 2015-06-17 VITALS — BP 117/67 | HR 89 | Temp 98.5°F | Ht 60.0 in | Wt 262.1 lb

## 2015-06-17 DIAGNOSIS — Z124 Encounter for screening for malignant neoplasm of cervix: Secondary | ICD-10-CM

## 2015-06-17 DIAGNOSIS — Z01419 Encounter for gynecological examination (general) (routine) without abnormal findings: Secondary | ICD-10-CM | POA: Diagnosis present

## 2015-06-17 DIAGNOSIS — Z1151 Encounter for screening for human papillomavirus (HPV): Secondary | ICD-10-CM | POA: Insufficient documentation

## 2015-06-17 DIAGNOSIS — Z0000 Encounter for general adult medical examination without abnormal findings: Secondary | ICD-10-CM

## 2015-06-17 NOTE — Progress Notes (Signed)
   Subjective:    Elizabeth Diaz - 49 y.o. female MRN [id number]  Date of birth: 02-Jun-1966  HPI  Elizabeth Diaz is here for pap smear.   Annual gyn exam was conducted on 06/10/15.   She reports no changes since that visit. She didn't want a pap smear at that visit since she was actively on her cycle.  Health Maintenance:  Health Maintenance Due  Topic Date Due  . PNEUMOCOCCAL POLYSACCHARIDE VACCINE (1) 12/27/1967  . FOOT EXAM  12/27/1975  . OPHTHALMOLOGY EXAM  12/27/1975  . URINE MICROALBUMIN  12/27/1975  . PAP SMEAR  12/27/1986    -  reports that she quit smoking about 6 years ago. Her smoking use included Cigarettes. She has a 30 pack-year smoking history. She has never used smokeless tobacco. - Review of Systems: Per HPI. - Past Medical History: Patient Active Problem List   Diagnosis Date Noted  . Prediabetes 06/10/2015  . Annual physical exam 06/10/2015  . Bilateral lower extremity edema 04/08/2015  . Urinary incontinence 04/08/2015  . Dizziness 01/28/2015  . Chronic knee pain 12/07/2014  . Change in bowel habits   . Benign neoplasm of rectum   . Frequent loose stools   . Right-sided back pain 05/14/2014  . Fecal incontinence 05/06/2014   - Medications: reviewed and updated Current Outpatient Prescriptions  Medication Sig Dispense Refill  . cyclobenzaprine (FLEXERIL) 10 MG tablet Take 0.5-1 tablets (5-10 mg total) by mouth 3 (three) times daily as needed for muscle spasms. 60 tablet 0  . furosemide (LASIX) 40 MG tablet Take 1 tablet (40 mg total) by mouth daily as needed for edema. (Patient not taking: Reported on 04/08/2015) 30 tablet 0  . gabapentin (NEURONTIN) 300 MG capsule Take 1 capsule (300 mg total) by mouth 3 (three) times daily. 90 capsule 3  . Lido-Capsaicin-Men-Methyl Sal 0.5-0.035-5-20 % PTCH Apply 1 patch topically 2 (two) times daily as needed (pain). (Patient not taking: Reported on 04/08/2015) 30 patch 0  . traMADol (ULTRAM) 50 MG tablet Take 1  tablet (50 mg total) by mouth every 6 (six) hours as needed. 30 tablet 0   No current facility-administered medications for this visit.     Review of Systems See HPI     Objective:   Physical Exam BP 117/67 mmHg  Pulse 89  Temp(Src) 98.5 F (36.9 C) (Oral)  Ht 5' (1.524 m)  Wt 262 lb 1.6 oz (118.888 kg)  BMI 51.19 kg/m2  LMP 06/07/2015 Gen: NAD, alert, cooperative with exam, morbidly obese  GU: > External: no lesions > Vagina: no blood in vault > Cervix: no lesion;     Assessment & Plan:   Annual physical exam Pap done today.  - will call with results.

## 2015-06-17 NOTE — Assessment & Plan Note (Signed)
Pap done today.  - will call with results.

## 2015-06-17 NOTE — Patient Instructions (Signed)
Thank you for coming in,   I will call or send a letter with the results from today  Please bring all of your medications with you to each visit.   Sign up for My Chart to have easy access to your labs results, and communication with your Primary care physician   Please feel free to call with any questions or concerns at any time, at [phone]. --Dr. Raeford Razor

## 2015-06-18 LAB — CYTOLOGY - PAP

## 2015-06-19 ENCOUNTER — Encounter: Payer: Self-pay | Admitting: Family Medicine

## 2015-07-02 ENCOUNTER — Other Ambulatory Visit: Payer: BLUE CROSS/BLUE SHIELD

## 2015-07-02 ENCOUNTER — Ambulatory Visit (INDEPENDENT_AMBULATORY_CARE_PROVIDER_SITE_OTHER): Payer: BLUE CROSS/BLUE SHIELD | Admitting: Internal Medicine

## 2015-07-02 ENCOUNTER — Encounter: Payer: Self-pay | Admitting: Internal Medicine

## 2015-07-02 VITALS — BP 110/68 | HR 72 | Ht 60.0 in | Wt 269.0 lb

## 2015-07-02 DIAGNOSIS — K529 Noninfective gastroenteritis and colitis, unspecified: Secondary | ICD-10-CM | POA: Diagnosis not present

## 2015-07-02 DIAGNOSIS — R1013 Epigastric pain: Secondary | ICD-10-CM

## 2015-07-02 DIAGNOSIS — R159 Full incontinence of feces: Secondary | ICD-10-CM

## 2015-07-02 DIAGNOSIS — K219 Gastro-esophageal reflux disease without esophagitis: Secondary | ICD-10-CM | POA: Diagnosis not present

## 2015-07-02 MED ORDER — PANTOPRAZOLE SODIUM 40 MG PO TBEC: 40.0000 mg | DELAYED_RELEASE_TABLET | Freq: Every day | ORAL | Status: DC

## 2015-07-02 NOTE — Progress Notes (Signed)
Subjective:    Patient ID: Elizabeth Diaz, female    DOB: Jan 28, 1966, 49 y.o.   MRN: [id number]  HPI Elizabeth Diaz is a 49 year old female with chronic loose stools who returns for follow-up. She was initially seen in December 2015 and evaluation including colonoscopy performed on 11/01/2014. This showed 2 rectal polyps which were removed and found to be hyperplastic. Mild sigmoid diverticulosis in otherwise normal colonic mucosa. Random biopsies were negative for microscopic colitis. She has continued to have 5-10 loose stools per day including accidents and encopresis. She tried Metamucil and is also been started on oral iron. This is cost the first bowel movement of the day to be formed and occasionally hard but subsequent bowel movements throughout the day are loose. Again bowel movements 5-10 times per day. Nonbloody non-melenic. She's developed some mid and upper abdominal pain which occurs on a daily basis last 5-10 minutes. Doesn't seem to be associated with eating. She recalls a history of ulcer disease. She is having trouble with frequent heartburn and regurgitation. Denies dysphagia or odynophagia. Was using times and then baking soda. Not using H2 blocker or PPI at this point. Denies weight loss.   Review of Systems As per history of present illness, otherwise negative  Current Medications, Allergies, Past Medical History, Past Surgical History, Family History and Social History were reviewed in [employer] record.     Objective:   Physical Exam BP 110/68 mmHg  Pulse 72  Ht 5' (1.524 m)  Wt 269 lb (122.018 kg)  BMI 52.54 kg/m2  LMP 06/07/2015 (Approximate) Constitutional: Well-developed and well-nourished. No distress. HEENT: Normocephalic and atraumatic.  Conjunctivae are normal.  No scleral icterus. Neck: Neck supple. Trachea midline. Cardiovascular: Normal rate, regular rhythm and intact distal pulses.  Pulmonary/chest: Effort normal and breath  sounds normal. No wheezing, rales or rhonchi. Abdominal: Soft, obese, nontender, nondistended. Bowel sounds active throughout.  Extremities: no clubbing, cyanosis, or edema Lymphadenopathy: No cervical adenopathy noted. Neurological: Alert and oriented to person place and time. Skin: Skin is warm and dry. No rashes noted. Psychiatric: Normal mood and affect. Behavior is normal.  CBC    Component Value Date/Time   WBC 9.2 06/10/2015 1441   RBC 4.25 06/10/2015 1441   HGB 12.8 06/10/2015 1441   HCT 38.6 06/10/2015 1441   PLT 301 06/10/2015 1441   MCV 90.8 06/10/2015 1441   MCH 30.1 06/10/2015 1441   MCHC 33.2 06/10/2015 1441   RDW 13.7 06/10/2015 1441   LYMPHSABS 2.8 02/14/2014 1607   MONOABS 0.5 02/14/2014 1607   EOSABS 0.4 02/14/2014 1607   BASOSABS 0.0 02/14/2014 1607   Lab Results  Component Value Date   TSH 2.163 06/10/2015   CMP     Component Value Date/Time   NA 139 06/10/2015 1441   K 4.2 06/10/2015 1441   CL 104 06/10/2015 1441   CO2 27 06/10/2015 1441   GLUCOSE 94 06/10/2015 1441   BUN 9 06/10/2015 1441   CREATININE 0.63 06/10/2015 1441   CREATININE 0.65 02/14/2014 1607   CALCIUM 8.0* 06/10/2015 1441   PROT 8.0 03/30/2013 1637   ALBUMIN 3.5 03/30/2013 1637   AST 13 03/30/2013 1637   ALT 13 03/30/2013 1637   ALKPHOS 80 03/30/2013 1637   BILITOT 0.3 03/30/2013 1637   GFRNONAA >89 06/10/2015 1441   GFRNONAA >90 02/14/2014 1607   GFRAA >89 06/10/2015 1441   GFRAA >90 02/14/2014 1607       Assessment & Plan:  49 year old female  with chronic loose stools who returns for follow-up.   1. Chronic loose stools/occasional encopresis/new mid and upper abdominal pain and reflux -- begin pantoprazole 40 mg daily. Side effect of diarrhea mentioned and if diarrhea worsens she is asked to notify me. Upper endoscopy recommended. We discussed the risks, benefits and alternatives and she is agreeable to proceed. Celiac panel today. Stool studies to include fecal elastase,  fecal white blood cell count and GI pathogen panel. 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

## 2015-07-02 NOTE — Patient Instructions (Signed)
Your Endoscopy is scheduled at [facility] on 07/29/2015 at 11:30am  Separate instructions have been given Go to the basement for labs today  We have sent Protonix to your pharmacy today

## 2015-07-03 ENCOUNTER — Encounter (HOSPITAL_COMMUNITY): Payer: Self-pay | Admitting: *Deleted

## 2015-07-03 ENCOUNTER — Emergency Department (HOSPITAL_COMMUNITY)
Admission: EM | Admit: 2015-07-03 | Discharge: 2015-07-03 | Disposition: A | Discharge status: Home / Self Care | Payer: BLUE CROSS/BLUE SHIELD | Attending: Emergency Medicine | Admitting: Emergency Medicine

## 2015-07-03 DIAGNOSIS — Z87891 Personal history of nicotine dependence: Secondary | ICD-10-CM | POA: Diagnosis not present

## 2015-07-03 DIAGNOSIS — Z8719 Personal history of other diseases of the digestive system: Secondary | ICD-10-CM | POA: Diagnosis not present

## 2015-07-03 DIAGNOSIS — Z8709 Personal history of other diseases of the respiratory system: Secondary | ICD-10-CM | POA: Diagnosis not present

## 2015-07-03 DIAGNOSIS — M545 Low back pain: Secondary | ICD-10-CM | POA: Insufficient documentation

## 2015-07-03 DIAGNOSIS — G8929 Other chronic pain: Secondary | ICD-10-CM | POA: Insufficient documentation

## 2015-07-03 DIAGNOSIS — Z79899 Other long term (current) drug therapy: Secondary | ICD-10-CM | POA: Insufficient documentation

## 2015-07-03 DIAGNOSIS — E669 Obesity, unspecified: Secondary | ICD-10-CM | POA: Insufficient documentation

## 2015-07-03 DIAGNOSIS — M25561 Pain in right knee: Secondary | ICD-10-CM | POA: Insufficient documentation

## 2015-07-03 HISTORY — DX: Obesity, unspecified: E66.9

## 2015-07-03 LAB — TISSUE TRANSGLUTAMINASE, IGA: TISSUE TRANSGLUTAMINASE AB, IGA: 1 U/mL (ref <4)

## 2015-07-03 LAB — IGA: IGA: 178 mg/dL (ref 69–380)

## 2015-07-03 MED ORDER — KETOROLAC TROMETHAMINE 60 MG/2ML IM SOLN
60.0000 mg | Freq: Once | INTRAMUSCULAR | Status: AC
Start: 2015-07-03 — End: 2015-07-03
  Administered 2015-07-03: 60 mg via INTRAMUSCULAR
  Filled 2015-07-03: qty 2

## 2015-07-03 NOTE — ED Provider Notes (Signed)
CSN: [id number]     Arrival date & time 07/03/15  1625 History  By signing my name below, I, Meriel Pica, attest that this documentation has been prepared under the direction and in the presence of Domenic Moras, PA-C.  Electronically Signed: Meriel Pica, ED Scribe. 07/03/2015. 5:11 PM.    Chief Complaint  Patient presents with  . Back Pain  . Knee Pain   The history is provided by the patient. No language interpreter was used.   HPI Comments: Elizabeth Diaz is a 49 y.o. female, with a PMhx of osteoarthritis, left leg numbness, and obesity, who presents to the Emergency Department complaining of constant diffuse back pain and constant, 8/10, throbbing right knee pain that is chronic in nature and has been present for several years. The pt receives cortisone injections from her PCP. Additionally she is followed by Teresa Coombs, MD at [facility] where she was diagnosed with osteoarthritis in her right knee. She states she recently finished a course of hyaluronic acid injections from orthopaedist and on her last visit she had fluid removed from her right knee. She is also prescribed celebrex, flexeril and tramadol for management of her chronic arthralgias. Pt denies experiencing any relief with various treatments and medications. 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 Pt is ambulatory with a cane at baseline. Denies fevers or rashes, new numbness or weakness, history of IV drug abuse or PMhx of cancer.    Past Medical History  Diagnosis Date  . Pneumothorax   . Arthritis   . Seizures (HCC)     hx of, no seizures last few years  . Numbness     left leg  . Hyperplastic colon polyp   . Diverticulosis   . Obesity    Past Surgical History  Procedure Laterality Date  . Chest tube insertion    . Tubal ligation    . Colonoscopy with propofol N/A 11/01/2014    Procedure: COLONOSCOPY WITH  PROPOFOL;  Surgeon: Jerene Bears, MD;  Location: [facility] ENDOSCOPY;  Service: Gastroenterology;  Laterality: N/A;   Family History  Problem Relation Age of Onset  . Heart failure Father   . Diabetes Maternal Grandmother   . Diabetes Paternal Grandfather   . Diabetes Father   . Cancer Maternal Grandfather     unknown type   Social History  Substance Use Topics  . Smoking status: Former Smoker -- 1.50 packs/day for 20 years    Types: Cigarettes    Quit date: 05/04/2009  . Smokeless tobacco: Never Used  . Alcohol Use: 0.0 oz/week    0 Standard drinks or equivalent per week     Comment: drinks liquor every night. 2-3 glasses    OB History    No data available     Review of Systems  Constitutional: Negative for fever.  Musculoskeletal: Positive for back pain and arthralgias ( right knee).  Skin: Negative for rash.  Neurological: Negative for weakness and numbness.   Allergies  Review of patient's allergies indicates no known allergies.  Home Medications   Prior to Admission medications   Medication Sig Start Date End Date Taking? Authorizing Provider  celecoxib (CELEBREX) 200 MG capsule Take 200 mg by mouth 2 (two) times daily.    Historical Provider, MD  cyclobenzaprine (FLEXERIL) 10 MG tablet Take 0.5-1 tablets (5-10 mg total) by mouth 3 (three) times daily as needed for muscle spasms.  05/22/15   Rosemarie Ax, MD  furosemide (LASIX) 40 MG tablet Take 1 tablet (40 mg total) by mouth daily as needed for edema. 04/05/15   Robyn Haber, MD  gabapentin (NEURONTIN) 300 MG capsule Take 1 capsule (300 mg total) by mouth 3 (three) times daily. 06/10/15   Rosemarie Ax, MD  pantoprazole (PROTONIX) 40 MG tablet Take 1 tablet (40 mg total) by mouth daily. 07/02/15   Jerene Bears, MD  traMADol (ULTRAM) 50 MG tablet Take 1 tablet (50 mg total) by mouth every 6 (six) hours as needed. 06/10/15   Rosemarie Ax, MD   BP 135/81 mmHg  Pulse 105  Temp(Src) 98.3 F (36.8 C) (Oral)  Resp 18   SpO2 98%  LMP 06/07/2015 (Approximate) Physical Exam  Constitutional: She is oriented to person, place, and time. She appears well-developed and well-nourished.  HENT:  Head: Normocephalic and atraumatic.  Eyes: EOM are normal. Pupils are equal, round, and reactive to light.  Neck: Neck supple.  Cardiovascular: Normal rate and regular rhythm.   Pulmonary/Chest: No respiratory distress. She has no wheezes.  Abdominal: Soft. There is no tenderness.  Musculoskeletal: Normal range of motion. She exhibits tenderness. She exhibits no edema.  Right knee; tenderness noted to anterior knee and patellar region, mild effusion noted, no warmth or erythema, negative anterior and posterior drawer test, pain with Varus maneuver, normal valgus, sensation intact, pedal pulse intact. Pain along midline spine, more significant at lumbar region, no overlying skin changes, no crepitus or step-off.   Neurological: She is alert and oriented to person, place, and time. No cranial nerve deficit.  Skin: Skin is warm and dry. No erythema.  Psychiatric: She has a normal mood and affect.  Nursing note and vitals reviewed.   ED Course  Procedures  DIAGNOSTIC STUDIES: Oxygen Saturation is 98% on RA, normal by my interpretation.    COORDINATION OF CARE: 5:01 PM Discussed treatment plan with pt at bedside and pt agreed to plan. Will order Toradol injection and instructed pt to follow up with orthopedist for management of chronic right knee pain.    MDM   Final diagnoses:  Chronic low back pain  Chronic knee pain, right    BP 135/81 mmHg  Pulse 105  Temp(Src) 98.3 F (36.8 C) (Oral)  Resp 18  SpO2 98%  LMP 06/07/2015 (Approximate)   I, Mykenzie Ebanks, personally performed the services described in this documentation. All medical record entries made by the scribe were at my direction and in my presence.  I have reviewed the chart and discharge instructions and agree that the record reflects my personal  performance and is accurate and complete. Randall Rampersad.  07/03/2015. 5:20 PM.      Domenic Moras, PA-C 07/03/15 1720  Debby Freiberg, MD 07/04/15 1535

## 2015-07-03 NOTE — ED Notes (Signed)
Pt reports pain to right knee and muscle spasms to back and legs. Reports hx of same.

## 2015-07-03 NOTE — Discharge Instructions (Signed)

## 2015-07-09 ENCOUNTER — Ambulatory Visit (INDEPENDENT_AMBULATORY_CARE_PROVIDER_SITE_OTHER): Payer: BLUE CROSS/BLUE SHIELD | Admitting: Family Medicine

## 2015-07-09 ENCOUNTER — Encounter: Payer: Self-pay | Admitting: Family Medicine

## 2015-07-09 VITALS — BP 126/72 | HR 84 | Ht 60.0 in | Wt 275.0 lb

## 2015-07-09 DIAGNOSIS — G8929 Other chronic pain: Secondary | ICD-10-CM

## 2015-07-09 DIAGNOSIS — M25561 Pain in right knee: Secondary | ICD-10-CM

## 2015-07-09 DIAGNOSIS — M25562 Pain in left knee: Secondary | ICD-10-CM

## 2015-07-09 MED ORDER — TRAMADOL HCL 50 MG PO TABS: 50.0000 mg | ORAL_TABLET | Freq: Four times a day (QID) | ORAL | Status: DC | PRN

## 2015-07-09 MED ORDER — CYCLOBENZAPRINE HCL 10 MG PO TABS: 5.0000 mg | ORAL_TABLET | Freq: Three times a day (TID) | ORAL | Status: DC | PRN

## 2015-07-09 MED ORDER — MORPHINE SULFATE 10 MG/ML IJ SOLN
2.0000 mg | Freq: Once | INTRAMUSCULAR | Status: AC
  Administered 2015-07-09: 2 mg via INTRAMUSCULAR

## 2015-07-09 MED ORDER — GABAPENTIN 300 MG PO CAPS: 600.0000 mg | ORAL_CAPSULE | Freq: Three times a day (TID) | ORAL | Status: DC

## 2015-07-09 NOTE — Patient Instructions (Signed)
We will refill your tramadol and flexeril today. We will also increase your gabapentin.  Please take 2 pills at night, 1 pill in the morning, and 1 pill in the middle of the day for the next 3 days. If this goes well, please increase to 2 pills at night, 2 pills in the morning, and 1 pill in the middle of the day for the next 3 days. If this goes well, please increase to 2 pills three times per day.  You should follow up with your orthopedist and Dr Raeford Razor for your pain management.  Take care,  Dr Jerline Pain

## 2015-07-09 NOTE — Progress Notes (Signed)
    Subjective:  Elizabeth Diaz is a 49 y.o. female who presents to the [facility] today with a chief complaint of acute on chronic knee pain.   HPI:  Knee Pain Patient with a long standing history of chronic knee pain. Has previously been managed by her PCP and by [facility]. Patient reports that she has had significant pain in knees since going to work last Monday. Went to the emergency department 6 days ago and received toradol injection. Has previously has several injections in her knees and back and reports that none of these have helped in the past. Has had PT in the past which did not help.  Was not able to go to work today because of her knee pain. Reports that her job requires her to be on her feet for 9 hours at a time, which she can not tolerate. Tried to see her orthopedist today, but did not have copay and was not seen.  Pain described as sharp and achey. Rated as 9/10 today. Pain is in bilateral knees, with right>left. Has been taking celebrex per her orthopedist. Is also on flexeril and tramadol which help minimally. Recently increased her dose of gabapentin to 300mg  tid.   No fevers or chills. No weakness.   ROS: Per HPI   Objective:  Physical Exam: BP 126/72 mmHg  Pulse 84  Ht 5' (1.524 m)  Wt 275 lb (124.739 kg)  BMI 53.71 kg/m2  LMP 07/06/2015 (Approximate)  Gen: NAD, resting comfortably Lungs: NWOB MSK: -Right Knee: No deformities or effusions. Tenderness to lateral joint line. No warmth or erythema. Mild crepitus noted. FROM. Strength and sensation intact.  -Left Knee: No deformities of effusions. Tenderness along joint line. No warmth or erythema. Mild crepitus noted. FROM. Strength and sensation intact.  -Gait: Walks with a cane. No limp noted.  Skin: warm, dry Neuro: grossly normal, moves all extremities Psych: Normal affect and thought content  Assessment/Plan:  Chronic knee pain Secondary to OA. Refilled tramadol and flexeril today. Gave 2mg  of morphine  in office today given acute flare - not able to give toradol as patient on chronic NSAIDs already. Will not provide home narcotics for chronic pain. Will titrate up gabapentin to 600mg  tid. Instructed patient to follow up with orthopedist.    Algis Greenhouse. Jerline Pain, [facility] Resident PGY-2 07/09/2015 4:31 PM

## 2015-07-09 NOTE — Assessment & Plan Note (Addendum)
Secondary to OA. Refilled tramadol and flexeril today. Gave 2mg  of morphine in office today given acute flare - not able to give toradol as patient on chronic NSAIDs already. Will not provide home narcotics for chronic pain. Will titrate up gabapentin to 600mg  tid. Instructed patient to follow up with orthopedist.

## 2015-07-15 ENCOUNTER — Emergency Department (INDEPENDENT_AMBULATORY_CARE_PROVIDER_SITE_OTHER)
Admission: EM | Admit: 2015-07-15 | Discharge: 2015-07-15 | Disposition: A | Discharge status: Home / Self Care | Payer: BLUE CROSS/BLUE SHIELD | Source: Home / Self Care | Attending: Family Medicine | Admitting: Family Medicine

## 2015-07-15 ENCOUNTER — Encounter (HOSPITAL_COMMUNITY): Payer: Self-pay | Admitting: Emergency Medicine

## 2015-07-15 DIAGNOSIS — G8929 Other chronic pain: Secondary | ICD-10-CM

## 2015-07-15 DIAGNOSIS — M25569 Pain in unspecified knee: Secondary | ICD-10-CM

## 2015-07-15 MED ORDER — HYDROCODONE-ACETAMINOPHEN 5-325 MG PO TABS: 1.0000 | ORAL_TABLET | Freq: Four times a day (QID) | ORAL | Status: DC | PRN

## 2015-07-15 NOTE — ED Notes (Signed)
Pt here with chronic bilateral knee pain with past injections and gel shots States she needs co-pay to see Orthopedist Has appt with PCP tomorrow Hervey Ard, throbbing pain noted

## 2015-07-15 NOTE — ED Provider Notes (Signed)
CSN: [id number]     Arrival date & time 07/15/15  1917 History   First MD Initiated Contact with Patient 07/15/15 1944     Chief Complaint  Patient presents with  . Knee Pain   (Consider location/radiation/quality/duration/timing/severity/associated sxs/prior Treatment) Patient is a 49 y.o. female presenting with knee pain. The history is provided by the patient.  Knee Pain  is a 49 year old woman with chronic knee pain. She works as a Estate manager/land agent for [employer] and has been to see the orthopedist multiple times. She's had injections of cortisone injections of gel. Nothing has really worked. She has also received injections in her back thinking that some the pain might be coming from sciatica. That did not work either.  Currently her work schedule was reduced from 9 hours to 6 hours to accommodate the pain in her knees. Today she could not get out of bed and get to work because of the knee pain.  Patient is currently taking gabapentin 300 mg 3 times a day.  Patient has an appointment with her doctor tomorrow. She hopes to develop a long-term plan at that time.  Past Medical History  Diagnosis Date  . Pneumothorax   . Arthritis   . Seizures (HCC)     hx of, no seizures last few years  . Numbness     left leg  . Hyperplastic colon polyp   . Diverticulosis   . Obesity    Past Surgical History  Procedure Laterality Date  . Chest tube insertion    . Tubal ligation    . Colonoscopy with propofol N/A 11/01/2014    Procedure: COLONOSCOPY WITH PROPOFOL;  Surgeon: Jerene Bears, MD;  Location: [facility];  Service: Gastroenterology;  Laterality: N/A;   Family History  Problem Relation Age of Onset  . Heart failure Father   . Diabetes Maternal Grandmother   . Diabetes Paternal Grandfather   . Diabetes Father   . Cancer Maternal Grandfather     unknown type   Social History  Substance Use Topics  . Smoking status: Former Smoker -- 1.50 packs/day for 20 years    Types:  Cigarettes    Quit date: 05/04/2009  . Smokeless tobacco: Never Used  . Alcohol Use: 0.0 oz/week    0 Standard drinks or equivalent per week     Comment: drinks liquor every night. 2-3 glasses    OB History    No data available     Review of Systems  Constitutional: Negative.   HENT: Negative.   Eyes: Negative.   Respiratory: Negative.   Cardiovascular: Negative.   Genitourinary: Negative.   Skin: Negative.     Allergies  Review of patient's allergies indicates no known allergies.  Home Medications   Prior to Admission medications   Medication Sig Start Date End Date Taking? Authorizing Provider  celecoxib (CELEBREX) 200 MG capsule Take 200 mg by mouth 2 (two) times daily.    Historical Provider, MD  cyclobenzaprine (FLEXERIL) 10 MG tablet Take 0.5-1 tablets (5-10 mg total) by mouth 3 (three) times daily as needed for muscle spasms. 07/09/15   Vivi Barrack, MD  furosemide (LASIX) 40 MG tablet Take 1 tablet (40 mg total) by mouth daily as needed for edema. 04/05/15   Robyn Haber, MD  gabapentin (NEURONTIN) 300 MG capsule Take 2 capsules (600 mg total) by mouth 3 (three) times daily. 07/09/15   Vivi Barrack, MD  pantoprazole (PROTONIX) 40 MG tablet Take 1 tablet (40 mg total) by  mouth daily. 07/02/15   Jerene Bears, MD  traMADol (ULTRAM) 50 MG tablet Take 1 tablet (50 mg total) by mouth every 6 (six) hours as needed. 07/09/15   Vivi Barrack, MD   Meds Ordered and Administered this Visit  Medications - No data to display  LMP 07/06/2015 (Approximate) No data found.   Physical Exam  HENT:  Head: Normocephalic.  Right Ear: External ear normal.  Left Ear: External ear normal.  Eyes: Conjunctivae are normal. Pupils are equal, round, and reactive to light.  Neck: Normal range of motion. Neck supple.  Musculoskeletal: She exhibits no edema.  Skin: Skin is warm and dry.  Nursing note and vitals reviewed.  this is a morbidly obese woman in no acute distress. She  sitting in a wheelchair during the exam and interview. Knee exam reveals mild synovial thickening of the right knee without effusion. There is no effusion in the left knee. There is no overlying skin rash either knee and range of motion of each is normal. Straight leg raising is negative. She has no muscle wasting or obvious deformity of the knees. There is no localized tenderness of the knees.  ED Course  Procedures (including critical care time)   MDM  Assessment: Very little we have to offer this woman tonight. She needs to have a long-term plan formulated for her chronic knee pain by her orthopedist and primary care doctor  Plan: Work note for today and tomorrow Short dose of hydrocodone to get her through until her doctor visit tomorrow.  Signed, Robyn Haber M.D.   Robyn Haber, MD 07/15/15 2017

## 2015-07-15 NOTE — Discharge Instructions (Signed)
Keep your appointment with your doctor tomorrow to help formulate a long-term plan for the knee pain

## 2015-07-16 ENCOUNTER — Other Ambulatory Visit: Payer: BLUE CROSS/BLUE SHIELD

## 2015-07-16 ENCOUNTER — Telehealth: Payer: Self-pay | Admitting: Family Medicine

## 2015-07-16 DIAGNOSIS — K529 Noninfective gastroenteritis and colitis, unspecified: Secondary | ICD-10-CM

## 2015-07-16 DIAGNOSIS — M25561 Pain in right knee: Secondary | ICD-10-CM

## 2015-07-16 DIAGNOSIS — M25562 Pain in left knee: Principal | ICD-10-CM

## 2015-07-16 NOTE — Telephone Encounter (Signed)
Pt is calling because she needs to see her doctor. Dr. Raeford Razor doesn't have any appointments until 10/31 and she does not want to see anyone except him. She needs the doctor to write her out of work until 10/10/15. She was seen yesterday at [facility] and she wants to come in today and be seen. Please call patient because she is very upset to explain or make an appointment. jw

## 2015-07-16 NOTE — Telephone Encounter (Signed)
Pt calling once more about this request. Elizabeth Diaz, ASA

## 2015-07-16 NOTE — Telephone Encounter (Signed)
Spoke with patient about her pain and inability to work. She is requesting out of work until January. I will write her out of work for the next 3 weeks so she can have an evaluation by OT for functional occupational testing.   Called and informed of note at front desk.   Rosemarie Ax, MD PGY-3, [facility] 07/16/2015, 3:11 PM

## 2015-07-18 LAB — FECAL LACTOFERRIN, QUANT: LACTOFERRIN: NEGATIVE

## 2015-07-22 ENCOUNTER — Encounter (HOSPITAL_COMMUNITY): Payer: Self-pay | Admitting: *Deleted

## 2015-07-29 ENCOUNTER — Ambulatory Visit (HOSPITAL_COMMUNITY)
Admission: RE | Admit: 2015-07-29 | Discharge: 2015-07-29 | Disposition: A | Discharge status: Home / Self Care | Payer: BLUE CROSS/BLUE SHIELD | Source: Ambulatory Visit | Attending: Internal Medicine | Admitting: Internal Medicine

## 2015-07-29 ENCOUNTER — Encounter (HOSPITAL_COMMUNITY): Payer: Self-pay | Admitting: Internal Medicine

## 2015-07-29 ENCOUNTER — Encounter (HOSPITAL_COMMUNITY)
Admission: RE | Disposition: A | Discharge status: Home / Self Care | Payer: Self-pay | Source: Ambulatory Visit | Attending: Internal Medicine

## 2015-07-29 ENCOUNTER — Ambulatory Visit (HOSPITAL_COMMUNITY): Payer: BLUE CROSS/BLUE SHIELD | Admitting: Anesthesiology

## 2015-07-29 DIAGNOSIS — B9681 Helicobacter pylori [H. pylori] as the cause of diseases classified elsewhere: Secondary | ICD-10-CM | POA: Diagnosis not present

## 2015-07-29 DIAGNOSIS — R1013 Epigastric pain: Secondary | ICD-10-CM | POA: Insufficient documentation

## 2015-07-29 DIAGNOSIS — Z87891 Personal history of nicotine dependence: Secondary | ICD-10-CM | POA: Diagnosis not present

## 2015-07-29 DIAGNOSIS — R7303 Prediabetes: Secondary | ICD-10-CM | POA: Insufficient documentation

## 2015-07-29 DIAGNOSIS — K529 Noninfective gastroenteritis and colitis, unspecified: Secondary | ICD-10-CM | POA: Insufficient documentation

## 2015-07-29 DIAGNOSIS — K2950 Unspecified chronic gastritis without bleeding: Secondary | ICD-10-CM | POA: Insufficient documentation

## 2015-07-29 DIAGNOSIS — Z6841 Body Mass Index (BMI) 40.0 and over, adult: Secondary | ICD-10-CM | POA: Diagnosis not present

## 2015-07-29 DIAGNOSIS — Z79899 Other long term (current) drug therapy: Secondary | ICD-10-CM | POA: Diagnosis not present

## 2015-07-29 DIAGNOSIS — R569 Unspecified convulsions: Secondary | ICD-10-CM | POA: Insufficient documentation

## 2015-07-29 DIAGNOSIS — R197 Diarrhea, unspecified: Secondary | ICD-10-CM | POA: Diagnosis not present

## 2015-07-29 DIAGNOSIS — G4730 Sleep apnea, unspecified: Secondary | ICD-10-CM | POA: Insufficient documentation

## 2015-07-29 DIAGNOSIS — K219 Gastro-esophageal reflux disease without esophagitis: Secondary | ICD-10-CM

## 2015-07-29 DIAGNOSIS — M1990 Unspecified osteoarthritis, unspecified site: Secondary | ICD-10-CM | POA: Diagnosis not present

## 2015-07-29 DIAGNOSIS — Z8711 Personal history of peptic ulcer disease: Secondary | ICD-10-CM | POA: Insufficient documentation

## 2015-07-29 HISTORY — PX: ESOPHAGOGASTRODUODENOSCOPY (EGD) WITH PROPOFOL: SHX5813

## 2015-07-29 SURGERY — ESOPHAGOGASTRODUODENOSCOPY (EGD) WITH PROPOFOL
Anesthesia: Monitor Anesthesia Care

## 2015-07-29 MED ORDER — SODIUM CHLORIDE 0.9 % IV SOLN: INTRAVENOUS | Status: DC

## 2015-07-29 MED ORDER — LACTATED RINGERS IV SOLN
INTRAVENOUS | Status: DC
  Administered 2015-07-29: 10:00:00 via INTRAVENOUS

## 2015-07-29 MED ORDER — PROPOFOL 500 MG/50ML IV EMUL
INTRAVENOUS | Status: DC | PRN
  Administered 2015-07-29: 75 ug/kg/min via INTRAVENOUS

## 2015-07-29 MED ORDER — PROPOFOL 10 MG/ML IV BOLUS
INTRAVENOUS | Status: AC
  Filled 2015-07-29: qty 20

## 2015-07-29 MED ORDER — PROPOFOL 10 MG/ML IV BOLUS
INTRAVENOUS | Status: DC | PRN
  Administered 2015-07-29: 50 mg via INTRAVENOUS
  Administered 2015-07-29 (×2): 20 mg via INTRAVENOUS

## 2015-07-29 SURGICAL SUPPLY — 14 items

## 2015-07-29 NOTE — H&P (View-Only) (Signed)
Subjective:    Patient ID: Elizabeth Diaz, female    DOB: [birth year], 49 y.o.   MRN: [id number]  HPI Elizabeth Diaz is a 49 year old female with chronic loose stools who returns for follow-up. She was initially seen in December 2015 and evaluation including colonoscopy performed on 11/01/2014. This showed 2 rectal polyps which were removed and found to be hyperplastic. Mild sigmoid diverticulosis in otherwise normal colonic mucosa. Random biopsies were negative for microscopic colitis. She has continued to have 5-10 loose stools per day including accidents and encopresis. She tried Metamucil and is also been started on oral iron. This is cost the first bowel movement of the day to be formed and occasionally hard but subsequent bowel movements throughout the day are loose. Again bowel movements 5-10 times per day. Nonbloody non-melenic. She's developed some mid and upper abdominal pain which occurs on a daily basis last 5-10 minutes. Doesn't seem to be associated with eating. She recalls a history of ulcer disease. She is having trouble with frequent heartburn and regurgitation. Denies dysphagia or odynophagia. Was using times and then baking soda. Not using H2 blocker or PPI at this point. Denies weight loss.   Review of Systems As per history of present illness, otherwise negative  Current Medications, Allergies, Past Medical History, Past Surgical History, Family History and Social History were reviewed in [employer] record.     Objective:   Physical Exam BP 110/68 mmHg  Pulse 72  Ht 5' (1.524 m)  Wt 269 lb (122.018 kg)  BMI 52.54 kg/m2  LMP 06/07/2015 (Approximate) Constitutional: Well-developed and well-nourished. No distress. HEENT: Normocephalic and atraumatic.  Conjunctivae are normal.  No scleral icterus. Neck: Neck supple. Trachea midline. Cardiovascular: Normal rate, regular rhythm and intact distal pulses.  Pulmonary/chest: Effort normal and breath  sounds normal. No wheezing, rales or rhonchi. Abdominal: Soft, obese, nontender, nondistended. Bowel sounds active throughout.  Extremities: no clubbing, cyanosis, or edema Lymphadenopathy: No cervical adenopathy noted. Neurological: Alert and oriented to person place and time. Skin: Skin is warm and dry. No rashes noted. Psychiatric: Normal mood and affect. Behavior is normal.  CBC    Component Value Date/Time   WBC 9.2 06/10/2015 1441   RBC 4.25 06/10/2015 1441   HGB 12.8 06/10/2015 1441   HCT 38.6 06/10/2015 1441   PLT 301 06/10/2015 1441   MCV 90.8 06/10/2015 1441   MCH 30.1 06/10/2015 1441   MCHC 33.2 06/10/2015 1441   RDW 13.7 06/10/2015 1441   LYMPHSABS 2.8 02/14/2014 1607   MONOABS 0.5 02/14/2014 1607   EOSABS 0.4 02/14/2014 1607   BASOSABS 0.0 02/14/2014 1607   Lab Results  Component Value Date   TSH 2.163 06/10/2015   CMP     Component Value Date/Time   NA 139 06/10/2015 1441   K 4.2 06/10/2015 1441   CL 104 06/10/2015 1441   CO2 27 06/10/2015 1441   GLUCOSE 94 06/10/2015 1441   BUN 9 06/10/2015 1441   CREATININE 0.63 06/10/2015 1441   CREATININE 0.65 02/14/2014 1607   CALCIUM 8.0* 06/10/2015 1441   PROT 8.0 03/30/2013 1637   ALBUMIN 3.5 03/30/2013 1637   AST 13 03/30/2013 1637   ALT 13 03/30/2013 1637   ALKPHOS 80 03/30/2013 1637   BILITOT 0.3 03/30/2013 1637   GFRNONAA >89 06/10/2015 1441   GFRNONAA >90 02/14/2014 1607   GFRAA >89 06/10/2015 1441   GFRAA >90 02/14/2014 1607       Assessment & Plan:  49 year old female  with chronic loose stools who returns for follow-up.   1. Chronic loose stools/occasional encopresis/new mid and upper abdominal pain and reflux -- begin pantoprazole 40 mg daily. Side effect of diarrhea mentioned and if diarrhea worsens she is asked to notify me. Upper endoscopy recommended. We discussed the risks, benefits and alternatives and she is agreeable to proceed. Celiac panel today. Stool studies to include fecal elastase,  fecal white blood cell count and GI pathogen panel. 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

## 2015-07-29 NOTE — Anesthesia Postprocedure Evaluation (Signed)
  Anesthesia Post-op Note  Patient: Elizabeth Diaz  Procedure(s) Performed: Procedure(s) (LRB): ESOPHAGOGASTRODUODENOSCOPY (EGD) WITH PROPOFOL (N/A)  Patient Location: PACU  Anesthesia Type: MAC  Level of Consciousness: awake and alert   Airway and Oxygen Therapy: Patient Spontanous Breathing  Post-op Pain: mild  Post-op Assessment: Post-op Vital signs reviewed, Patient's Cardiovascular Status Stable, Respiratory Function Stable, Patent Airway and No signs of Nausea or vomiting  Last Vitals:  Filed Vitals:   07/29/15 1220  BP: 134/105  Pulse: 79  Temp:   Resp: 25    Post-op Vital Signs: stable   Complications: No apparent anesthesia complications

## 2015-07-29 NOTE — Anesthesia Preprocedure Evaluation (Addendum)
Anesthesia Evaluation  Patient identified by MRN, date of birth, ID band Patient awake    Reviewed: Allergy & Precautions, H&P , NPO status , Patient's Chart, lab work & pertinent test results  Airway Mallampati: III  TM Distance: >3 FB Neck ROM: Full    Dental no notable dental hx. (+) Teeth Intact, Dental Advisory Given   Pulmonary sleep apnea (Likely OSA) , former smoker,  History pneumothorax   Pulmonary exam normal breath sounds clear to auscultation       Cardiovascular Exercise Tolerance: Good negative cardio ROS Normal cardiovascular exam Rhythm:Regular Rate:Normal     Neuro/Psych Seizures -, Well Controlled,  No seizures last few years negative neurological ROS  negative psych ROS   GI/Hepatic negative GI ROS, Neg liver ROS,   Endo/Other  Morbid obesityprediabetes  Renal/GU negative Renal ROS  negative genitourinary   Musculoskeletal  (+) Arthritis ,   Abdominal (+) + obese,   Peds  Hematology negative hematology ROS (+)   Anesthesia Other Findings   Reproductive/Obstetrics negative OB ROS                            Anesthesia Physical Anesthesia Plan  ASA: IV  Anesthesia Plan: MAC   Post-op Pain Management:    Induction:   Airway Management Planned:   Additional Equipment:   Intra-op Plan:   Post-operative Plan:   Informed Consent: I have reviewed the patients History and Physical, chart, labs and discussed the procedure including the risks, benefits and alternatives for the proposed anesthesia with the patient or authorized representative who has indicated his/her understanding and acceptance.   Dental Advisory Given  Plan Discussed with: CRNA and Surgeon  Anesthesia Plan Comments:         Anesthesia Quick Evaluation

## 2015-07-29 NOTE — Discharge Instructions (Signed)

## 2015-07-29 NOTE — Op Note (Signed)
[facility] [address] [place], [address]   ENDOSCOPY PROCEDURE REPORT  PATIENT: Elizabeth Diaz, Elizabeth Diaz  MR#: [id number] BIRTHDATE: 06-10-1966 , 49  yrs. old GENDER: female ENDOSCOPIST: Jerene Bears, MD PROCEDURE DATE:  07/29/2015 PROCEDURE:  EGD, diagnostic and EGD w/ biopsy ASA CLASS:     Class III INDICATIONS:  epigastric pain and chronic diarrhea, history of PUD.  MEDICATIONS: Monitored anesthesia care and Per Anesthesia TOPICAL ANESTHETIC: none  DESCRIPTION OF PROCEDURE: After the risks benefits and alternatives of the procedure were thoroughly explained, informed consent was obtained.  The Pentax Gastroscope [id number] endoscope was introduced through the mouth and advanced to the second portion of the duodenum , Without limitations.  The instrument was slowly withdrawn as the mucosa was fully examined.  ESOPHAGUS: The mucosa of the esophagus appeared normal.   A partial Schatzki's ring was found at the gastroesophageal junction and was widely open.  No evidence of Barrett's change.  Small, 1-2 cm, hiatal hernia noted.  STOMACH: The mucosa of the stomach appeared normal.  Cold forcep biopsies were taken at the gastric body, antrum and angularis to evaluate for h.  pylori.  DUODENUM: The duodenal mucosa showed no abnormalities in the bulb and 2nd part of the duodenum.  Cold forcep biopsies were taken in the second portion. Retroflexed views revealed a hiatal hernia.     The scope was then withdrawn from the patient and the procedure completed.  COMPLICATIONS: There were no immediate complications.  ENDOSCOPIC IMPRESSION: 1.   The mucosa of the esophagus appeared normal 2.   Partial and non-obstructing Schatzki's ring was found at the gastroesophageal junction 3.   The mucosa of the stomach appeared normal; multiple biopsies to exclude H. pylori 4.   The duodenal mucosa showed no abnormalities in the bulb and 2nd part of the duodenum; multiple  biopsies  RECOMMENDATIONS: 1.  Await biopsy results 2.  If biopsies unrevealing, then consider trial of Viberzi 100 mg twice daily for persistent loose stools/diarrhea  eSigned:  Jerene Bears, MD 07/29/2015 11:58 AM  CC: the patient, PCP

## 2015-07-29 NOTE — Transfer of Care (Signed)
Immediate Anesthesia Transfer of Care Note  Patient: Elizabeth Diaz  Procedure(s) Performed: Procedure(s): ESOPHAGOGASTRODUODENOSCOPY (EGD) WITH PROPOFOL (N/A)  Patient Location: PACU  Anesthesia Type:MAC  Level of Consciousness:  sedated, patient cooperative and responds to stimulation  Airway & Oxygen Therapy:Patient Spontanous Breathing and Patient connected to face mask oxgen  Post-op Assessment:  Report given to PACU RN and Post -op Vital signs reviewed and stable  Post vital signs:  Reviewed and stable  Last Vitals:  Filed Vitals:   07/29/15 1203  BP:   Pulse: 97  Temp:   Resp: 21    Complications: No apparent anesthesia complications

## 2015-07-29 NOTE — Interval H&P Note (Signed)
History and Physical Interval Note: Patient presents for upper endoscopy to evaluate epigastric abdominal pain, reported history of ulcers but also ongoing chronic loose stools and diarrhea. She was started on pantoprazole which helped her heartburn significantly but over the past 6 days she stopped all of her medications. She was worried about her medications building up tolerance within her body and no longer working. Her plan was to be off all medicines for 1 week and then resume. The nature of the procedure, as well as the risks, benefits, and alternatives were carefully and thoroughly reviewed with the patient. Ample time for discussion and questions allowed. The patient understood, was satisfied, and agreed to proceed.     07/29/2015 10:22 AM  Elizabeth Diaz  has presented today for surgery, with the diagnosis of GERD  The various methods of treatment have been discussed with the patient and family. After consideration of risks, benefits and other options for treatment, the patient has consented to  Procedure(s): ESOPHAGOGASTRODUODENOSCOPY (EGD) WITH PROPOFOL (N/A) as a surgical intervention .  The patient's history has been reviewed, patient examined, no change in status, stable for surgery.  I have reviewed the patient's chart and labs.  Questions were answered to the patient's satisfaction.     Sarabeth Benton M

## 2015-07-30 ENCOUNTER — Other Ambulatory Visit: Payer: Self-pay

## 2015-07-30 ENCOUNTER — Encounter (HOSPITAL_COMMUNITY): Payer: Self-pay | Admitting: Internal Medicine

## 2015-07-30 ENCOUNTER — Telehealth: Payer: Self-pay | Admitting: Internal Medicine

## 2015-07-30 MED ORDER — PANTOPRAZOLE SODIUM 40 MG PO TBEC: 40.0000 mg | DELAYED_RELEASE_TABLET | Freq: Two times a day (BID) | ORAL | Status: DC

## 2015-07-30 MED ORDER — BIS SUBCIT-METRONID-TETRACYC 140-125-125 MG PO CAPS: 3.0000 | ORAL_CAPSULE | Freq: Three times a day (TID) | ORAL | Status: DC

## 2015-07-31 ENCOUNTER — Other Ambulatory Visit: Payer: Self-pay | Admitting: Sports Medicine

## 2015-07-31 DIAGNOSIS — M545 Low back pain, unspecified: Secondary | ICD-10-CM

## 2015-07-31 MED ORDER — BIS SUBCIT-METRONID-TETRACYC 140-125-125 MG PO CAPS: 3.0000 | ORAL_CAPSULE | Freq: Three times a day (TID) | ORAL | Status: DC

## 2015-07-31 NOTE — Telephone Encounter (Signed)
Patient advised that she may pick up a full 10 day course sample from our office. Sample placed at front desk. Patient verbalizes understanding.

## 2015-08-02 ENCOUNTER — Telehealth: Payer: Self-pay | Admitting: Family Medicine

## 2015-08-02 NOTE — Telephone Encounter (Signed)
Dr. Raeford Razor took pt out of work until 08/06/15. Patient will need a letter from Dr. Raeford Razor stating she can return to work. Also, FMLA paperwork has been faxed to the office to be completed by Raeford Razor. Please advise.

## 2015-08-05 NOTE — Telephone Encounter (Signed)
Pt is calling to check the status of the letter to return to work and also the [employer]. Please call patient ASAP since she doesn't want to loose her job. Elizabeth Diaz

## 2015-08-05 NOTE — Telephone Encounter (Signed)
Needs a letter stating dr releases her to go back to work Nov 8 Would like to pick up letter this afternoon  Job was suppose to fax dr Katherina Right papers. Wants to know if he received them.  They need to be in on Nov 9

## 2015-08-06 ENCOUNTER — Ambulatory Visit
Admission: RE | Admit: 2015-08-06 | Discharge: 2015-08-06 | Disposition: A | Discharge status: Home / Self Care | Payer: BLUE CROSS/BLUE SHIELD | Source: Ambulatory Visit | Attending: Sports Medicine | Admitting: Sports Medicine

## 2015-08-06 DIAGNOSIS — M545 Low back pain, unspecified: Secondary | ICD-10-CM

## 2015-08-06 NOTE — Telephone Encounter (Signed)
Pt is calling again about the letter to go back to work and her FMLA papers. She will be fired if she doesn't if she has these papers. If the doctor wants her to stay out of work then say that or write the letter so she can return to work. She also needs the FMLA papers by 08/07/15. Please call her ASAP. Elizabeth Diaz

## 2015-08-07 ENCOUNTER — Encounter: Payer: Self-pay | Admitting: *Deleted

## 2015-08-07 NOTE — Telephone Encounter (Signed)
Contacted pt and informed her that PCP did not have the FMLA papers at which time pt stated that they were LOA (Leave of Absence) papers.  I gave her the fax number so she can have them faxed here again for PCP to fill out.  Also a letter has been placed up front for pt to pick up which states she can return to work on 08/07/15 on light duty per PCP. Katharina Caper, Teondra Newburg D, [place]

## 2015-08-16 ENCOUNTER — Emergency Department (HOSPITAL_COMMUNITY)
Admission: EM | Admit: 2015-08-16 | Discharge: 2015-08-16 | Disposition: A | Discharge status: Home / Self Care | Payer: BLUE CROSS/BLUE SHIELD | Attending: Emergency Medicine | Admitting: Emergency Medicine

## 2015-08-16 ENCOUNTER — Encounter (HOSPITAL_COMMUNITY): Payer: Self-pay | Admitting: Emergency Medicine

## 2015-08-16 DIAGNOSIS — Z79899 Other long term (current) drug therapy: Secondary | ICD-10-CM | POA: Insufficient documentation

## 2015-08-16 DIAGNOSIS — M1990 Unspecified osteoarthritis, unspecified site: Secondary | ICD-10-CM | POA: Insufficient documentation

## 2015-08-16 DIAGNOSIS — M25561 Pain in right knee: Secondary | ICD-10-CM | POA: Diagnosis not present

## 2015-08-16 DIAGNOSIS — G40909 Epilepsy, unspecified, not intractable, without status epilepticus: Secondary | ICD-10-CM | POA: Diagnosis not present

## 2015-08-16 DIAGNOSIS — M25562 Pain in left knee: Secondary | ICD-10-CM | POA: Insufficient documentation

## 2015-08-16 DIAGNOSIS — Z8719 Personal history of other diseases of the digestive system: Secondary | ICD-10-CM | POA: Diagnosis not present

## 2015-08-16 DIAGNOSIS — E669 Obesity, unspecified: Secondary | ICD-10-CM | POA: Insufficient documentation

## 2015-08-16 DIAGNOSIS — Z8709 Personal history of other diseases of the respiratory system: Secondary | ICD-10-CM | POA: Diagnosis not present

## 2015-08-16 DIAGNOSIS — Z87891 Personal history of nicotine dependence: Secondary | ICD-10-CM | POA: Insufficient documentation

## 2015-08-16 DIAGNOSIS — G8929 Other chronic pain: Secondary | ICD-10-CM | POA: Diagnosis not present

## 2015-08-16 DIAGNOSIS — Z86010 Personal history of colonic polyps: Secondary | ICD-10-CM | POA: Diagnosis not present

## 2015-08-16 NOTE — ED Notes (Signed)
Chronic knee pain that worsened when patient felt a "pop" when walking yesterday.   Patient denies other symptoms.

## 2015-08-16 NOTE — ED Notes (Signed)
Fall Risk armband applied.

## 2015-08-16 NOTE — ED Notes (Signed)
Pt reports that she has an appointment with ortho-Dr. Paulla Fore at [facility] on Monday.

## 2015-08-16 NOTE — Discharge Instructions (Signed)
Chronic Pain  Chronic pain can be defined as pain that is off and on and lasts for 3-6 months or longer. Many things cause chronic pain, which can make it difficult to make a diagnosis. There are many treatment options available for chronic pain. However, finding a treatment that works well for you may require trying various approaches until the right one is found. Many people benefit from a combination of two or more types of treatment to control their pain.  SYMPTOMS   Chronic pain can occur anywhere in the body and can range from mild to very severe. Some types of chronic pain include:  · Headache.  · Low back pain.  · Cancer pain.  · Arthritis pain.  · Neurogenic pain. This is pain resulting from damage to nerves.   People with chronic pain may also have other symptoms such as:  · Depression.  · Anger.  · Insomnia.  · Anxiety.  DIAGNOSIS   Your health care provider will help diagnose your condition over time. In many cases, the initial focus will be on excluding possible conditions that could be causing the pain. Depending on your symptoms, your health care provider may order tests to diagnose your condition. Some of these tests may include:   · Blood tests.    · CT scan.    · MRI.    · X-rays.    · Ultrasounds.    · Nerve conduction studies.    You may need to see a specialist.   TREATMENT   Finding treatment that works well may take time. You may be referred to a pain specialist. He or she may prescribe medicine or therapies, such as:   · Mindful meditation or yoga.  · Shots (injections) of numbing or pain-relieving medicines into the spine or area of pain.  · Local electrical stimulation.  · Acupuncture.    · Massage therapy.    · Aroma, color, light, or sound therapy.    · Biofeedback.    · Working with a physical therapist to keep from getting stiff.    · Regular, gentle exercise.    · Cognitive or behavioral therapy.    · Group support.    Sometimes, surgery may be recommended.   HOME CARE INSTRUCTIONS    · Take all medicines as directed by your health care provider.    · Lessen stress in your life by relaxing and doing things such as listening to calming music.    · Exercise or be active as directed by your health care provider.    · Eat a healthy diet and include things such as vegetables, fruits, fish, and lean meats in your diet.    · Keep all follow-up appointments with your health care provider.    · Attend a support group with others suffering from chronic pain.  SEEK MEDICAL CARE IF:   · Your pain gets worse.    · You develop a new pain that was not there before.    · You cannot tolerate medicines given to you by your health care provider.    · You have new symptoms since your last visit with your health care provider.    SEEK IMMEDIATE MEDICAL CARE IF:   · You feel weak.    · You have decreased sensation or numbness.    · You lose control of bowel or bladder function.    · Your pain suddenly gets much worse.    · You develop shaking.  · You develop chills.  · You develop confusion.  · You develop chest pain.  · You develop shortness of breath.    MAKE SURE YOU:  ·   Document Revised: 05/17/2013 Document Reviewed: 03/10/2013 Elsevier Interactive Patient Education 2016 [place].  Cryotherapy Cryotherapy means treatment with cold. Ice or gel packs can be used to reduce both pain and swelling. Ice is the most helpful within the first 24 to 48 hours after an injury or flare-up from overusing a muscle or joint. Sprains, strains, spasms, burning pain, shooting pain, and aches can all be eased with ice. Ice can also be used when recovering from surgery. Ice is effective, has  very few side effects, and is safe for most people to use. PRECAUTIONS  Ice is not a safe treatment option for people with:  Raynaud phenomenon. This is a condition affecting small blood vessels in the extremities. Exposure to cold may cause your problems to return.  Cold hypersensitivity. There are many forms of cold hypersensitivity, including:  Cold urticaria. Red, itchy hives appear on the skin when the tissues begin to warm after being iced.  Cold erythema. This is a red, itchy rash caused by exposure to cold.  Cold hemoglobinuria. Red blood cells break down when the tissues begin to warm after being iced. The hemoglobin that carry oxygen are passed into the urine because they cannot combine with blood proteins fast enough.  Numbness or altered sensitivity in the area being iced. If you have any of the following conditions, do not use ice until you have discussed cryotherapy with your caregiver:  Heart conditions, such as arrhythmia, angina, or chronic heart disease.  High blood pressure.  Healing wounds or open skin in the area being iced.  Current infections.  Rheumatoid arthritis.  Poor circulation.  Diabetes. Ice slows the blood flow in the region it is applied. This is beneficial when trying to stop inflamed tissues from spreading irritating chemicals to surrounding tissues. However, if you expose your skin to cold temperatures for too long or without the proper protection, you can damage your skin or nerves. Watch for signs of skin damage due to cold. HOME CARE INSTRUCTIONS Follow these tips to use ice and cold packs safely.  Place a dry or damp towel between the ice and skin. A damp towel will cool the skin more quickly, so you may need to shorten the time that the ice is used.  For a more rapid response, add gentle compression to the ice.  Ice for no more than 10 to 20 minutes at a time. The bonier the area you are icing, the less time it will take to get the  benefits of ice.  Check your skin after 5 minutes to make sure there are no signs of a poor response to cold or skin damage.  Rest 20 minutes or more between uses.  Once your skin is numb, you can end your treatment. You can test numbness by very lightly touching your skin. The touch should be so light that you do not see the skin dimple from the pressure of your fingertip. When using ice, most people will feel these normal sensations in this order: cold, burning, aching, and numbness.  Do not use ice on someone who cannot communicate their responses to pain, such as small children or people with dementia. HOW TO MAKE AN ICE PACK Ice packs are the most common way to use ice therapy. Other methods include ice massage, ice baths, and cryosprays. Muscle creams that cause a cold, tingly feeling do not offer the same benefits that ice offers and should not be used as a substitute unless recommended by your caregiver. To  make an ice pack, do one of the following:  Place crushed ice or a bag of frozen vegetables in a sealable plastic bag. Squeeze out the excess air. Place this bag inside another plastic bag. Slide the bag into a pillowcase or place a damp towel between your skin and the bag.  Mix 3 parts water with 1 part rubbing alcohol. Freeze the mixture in a sealable plastic bag. When you remove the mixture from the freezer, it will be slushy. Squeeze out the excess air. Place this bag inside another plastic bag. Slide the bag into a pillowcase or place a damp towel between your skin and the bag. SEEK MEDICAL CARE IF:  You develop white spots on your skin. This may give the skin a blotchy (mottled) appearance.  Your skin turns blue or pale.  Your skin becomes waxy or hard.  Your swelling gets worse. MAKE SURE YOU:   Understand these instructions.  Will watch your condition.  Will get help right away if you are not doing well or get worse.   This information is not intended to replace  advice given to you by your health care provider. Make sure you discuss any questions you have with your health care provider.   Document Released: 05/11/2011 Document Revised: 10/05/2014 Document Reviewed: 05/11/2011 Elsevier Interactive Patient Education 2016 [employer]  Knee Pain Knee pain is a very common symptom and can have many causes. Knee pain often goes away when you follow your health care provider's instructions for relieving pain and discomfort at home. However, knee pain can develop into a condition that needs treatment. Some conditions may include:  Arthritis caused by wear and tear (osteoarthritis).  Arthritis caused by swelling and irritation (rheumatoid arthritis or gout).  A cyst or growth in your knee.  An infection in your knee joint.  An injury that will not heal.  Damage, swelling, or irritation of the tissues that support your knee (torn ligaments or tendinitis). If your knee pain continues, additional tests may be ordered to diagnose your condition. Tests may include X-rays or other imaging studies of your knee. You may also need to have fluid removed from your knee. Treatment for ongoing knee pain depends on the cause, but treatment may include:  Medicines to relieve pain or swelling.  Steroid injections in your knee.  Physical therapy.  Surgery. HOME CARE INSTRUCTIONS  Take medicines only as directed by your health care provider.  Rest your knee and keep it raised (elevated) while you are resting.  Do not do things that cause or worsen pain.  Avoid high-impact activities or exercises, such as running, jumping rope, or doing jumping jacks.  Apply ice to the knee area:  Put ice in a plastic bag.  Place a towel between your skin and the bag.  Leave the ice on for 20 minutes, 2-3 times a day.  Ask your health care provider if you should wear an elastic knee support.  Keep a pillow under your knee when you sleep.  Lose weight if you are  overweight. Extra weight can put pressure on your knee.  Do not use any tobacco products, including cigarettes, chewing tobacco, or electronic cigarettes. If you need help quitting, ask your health care provider. Smoking may slow the healing of any bone and joint problems that you may have. SEEK MEDICAL CARE IF:  Your knee pain continues, changes, or gets worse.  You have a fever along with knee pain.  Your knee buckles or locks up.  Your knee becomes more swollen. SEEK IMMEDIATE MEDICAL CARE IF:   Your knee joint feels hot to the touch.  You have chest pain or trouble breathing.   This information is not intended to replace advice given to you by your health care provider. Make sure you discuss any questions you have with your health care provider.    Follow up with your orthopedist on Monday for re-evaluation. Return to the emergency department if your joint becomes warm to the touch or red, if it becomes swollen, or if you run a fever.

## 2015-08-16 NOTE — ED Provider Notes (Signed)
CSN: [id number]     Arrival date & time 08/16/15  1022 History  By signing my name below, I, Meriel Pica, attest that this documentation has been prepared under the direction and in the presence of [employer], PA-C. Electronically Signed: Meriel Pica, ED Scribe. 08/16/2015. 10:49 AM.   Chief Complaint  Patient presents with  . Knee Pain   The history is provided by the patient. No language interpreter was used.   HPI Comments: Elizabeth Diaz is a 50 y.o. female, with a PMhx of chronic bilateral knee pain, chronic back pain, arthritis, and obesity, who presents to the Emergency Department complaining of an exacerbation of her chronic anterior and posterior left knee pain onset last night. Pt reports last night she was walking around her house when she felt a pop in her anterior left knee which she attributes to her exacerbation of pain. Pt did not fall at that time. Pt is ambulatory with a cane at baseline and has been able to continue ambulating with a cane since the incident. She received an MRI 3 days ago and is awaiting review from her PCP for further treatment of her chronic pain. She is prescribed gabapentin, tramadol, flexeril, and Norco 5-325mg  for chronic pain and lasix for chronic swelling in BLE. The pt also endorses chronic back spasms at baseline. She has tingling in her left upper leg at baseline but denies any new or worsening numbness, tingling, or weakness in LLE.  The pt has a follow up with her orthopaedist doctor in 3 days. No overlying skin changes or new swelling. No fever.   Orthopaedist: Dr. Paulla Fore PCP: Dr. Raeford Razor   Past Medical History  Diagnosis Date  . Pneumothorax   . Arthritis   . Numbness     left leg  . Hyperplastic colon polyp   . Diverticulosis   . Obesity   . Seizures ([place])     hx of, no seizures last few years   Past Surgical History  Procedure Laterality Date  . Chest tube insertion    . Tubal ligation    . Colonoscopy with propofol  N/A 11/01/2014    Procedure: COLONOSCOPY WITH PROPOFOL;  Surgeon: Jerene Bears, MD;  Location: [facility];  Service: Gastroenterology;  Laterality: N/A;  . Esophagogastroduodenoscopy (egd) with propofol N/A 07/29/2015    Procedure: ESOPHAGOGASTRODUODENOSCOPY (EGD) WITH PROPOFOL;  Surgeon: Jerene Bears, MD;  Location: [facility];  Service: Gastroenterology;  Laterality: N/A;   Family History  Problem Relation Age of Onset  . Heart failure Father   . Diabetes Maternal Grandmother   . Diabetes Paternal Grandfather   . Diabetes Father   . Cancer Maternal Grandfather     unknown type   Social History  Substance Use Topics  . Smoking status: Former Smoker -- 1.50 packs/day for 20 years    Types: Cigarettes    Quit date: 05/04/2009  . Smokeless tobacco: Never Used  . Alcohol Use: 0.0 oz/week    0 Standard drinks or equivalent per week     Comment: drinks liquor every night. 2-3 glasses    OB History    No data available     Review of Systems  Constitutional: Negative for fever.  Musculoskeletal: Positive for arthralgias ( left knee). Negative for joint swelling.  Neurological: Negative for weakness and numbness.  All other systems reviewed and are negative.  Allergies  Review of patient's allergies indicates no known allergies.  Home Medications   Prior to Admission medications  Medication Sig Start Date End Date Taking? Authorizing Provider  bismuth-metronidazole-tetracycline [facility]) [phone] MG capsule Take 3 capsules by mouth 4 (four) times daily -  before meals and at bedtime. 07/31/15   Jerene Bears, MD  celecoxib (CELEBREX) 200 MG capsule Take 200 mg by mouth 2 (two) times daily.    Historical Provider, MD  cyclobenzaprine (FLEXERIL) 10 MG tablet Take 0.5-1 tablets (5-10 mg total) by mouth 3 (three) times daily as needed for muscle spasms. 07/09/15   Vivi Barrack, MD  furosemide (LASIX) 40 MG tablet Take 1 tablet (40 mg total) by mouth daily as needed for edema.  04/05/15   Robyn Haber, MD  gabapentin (NEURONTIN) 300 MG capsule Take 2 capsules (600 mg total) by mouth 3 (three) times daily. 07/09/15   Vivi Barrack, MD  HYDROcodone-acetaminophen (NORCO) 5-325 MG tablet Take 1 tablet by mouth every 6 (six) hours as needed. 07/15/15   Robyn Haber, MD  pantoprazole (PROTONIX) 40 MG tablet Take 1 tablet (40 mg total) by mouth daily. 07/02/15   Jerene Bears, MD  pantoprazole (PROTONIX) 40 MG tablet Take 1 tablet (40 mg total) by mouth 2 (two) times daily. 07/30/15   Jerene Bears, MD   BP 128/71 mmHg  Pulse 93  Temp(Src) 98.3 F (36.8 C) (Oral)  Resp 20  Ht 5' (1.524 m)  Wt 265 lb (120.203 kg)  BMI 51.75 kg/m2  SpO2 96% Physical Exam  Constitutional: She is oriented to person, place, and time. She appears well-developed and well-nourished. No distress.  HENT:  Head: Normocephalic and atraumatic.  Eyes: Conjunctivae are normal. Right eye exhibits no discharge. Left eye exhibits no discharge. No scleral icterus.  Cardiovascular: Normal rate.   Pulmonary/Chest: Effort normal.  Musculoskeletal:  Negative anterior/poster drawer bilaterally. Negative ballottement test. No varus or valgus laxity. No crepitus. No pain with flexion or extension. No TTP of knees or ankles.    Neurological: She is alert and oriented to person, place, and time. Coordination normal.  Skin: Skin is warm and dry. No rash noted. She is not diaphoretic. No erythema. No pallor.  Psychiatric: She has a normal mood and affect. Her behavior is normal.  Nursing note and vitals reviewed.  ED Course  Procedures  DIAGNOSTIC STUDIES: Oxygen Saturation is 96% on RA, adequate by my interpretation.    COORDINATION OF CARE: 10:46 AM Discussed treatment plan with pt at bedside and pt agreed to plan. Advised pt to continue taking her home medications and apply ice as needed. She has follow up with her orthopaedist in 3 days. Pt agrees to this plan.    MDM   Final diagnoses:  Chronic  knee pain, left  Pt here with knee pain onset 2 weeks ago. No new injury or trauma. Will obtain xray.   Patient X-Ray negative for obvious fracture or dislocation. Pain managed in ED. Pt advised to follow up with ortho if symptoms persist. Patient given ace wrap while in ED, conservative therapy recommended and discussed. Patient will be dc home & is agreeable with above plan.  I personally performed the services described in this documentation, which was scribed in my presence. The recorded information has been reviewed and is accurate.     Dondra Spry [place], PA-C 08/16/15 [facility], MD 08/19/15 [phone]

## 2015-08-19 ENCOUNTER — Ambulatory Visit (INDEPENDENT_AMBULATORY_CARE_PROVIDER_SITE_OTHER): Payer: BLUE CROSS/BLUE SHIELD | Admitting: Family Medicine

## 2015-08-19 VITALS — BP 143/77 | HR 106 | Temp 98.4°F | Wt 272.2 lb

## 2015-08-19 DIAGNOSIS — G8929 Other chronic pain: Secondary | ICD-10-CM | POA: Diagnosis not present

## 2015-08-19 DIAGNOSIS — M25569 Pain in unspecified knee: Secondary | ICD-10-CM | POA: Diagnosis not present

## 2015-08-19 DIAGNOSIS — K219 Gastro-esophageal reflux disease without esophagitis: Secondary | ICD-10-CM | POA: Diagnosis not present

## 2015-08-19 DIAGNOSIS — R197 Diarrhea, unspecified: Secondary | ICD-10-CM | POA: Diagnosis not present

## 2015-08-19 NOTE — Assessment & Plan Note (Signed)
Orthopedics thinks pain may be coming from her back and obtain MRI. She has follow-up with them today. MRI: showing degenerative changes.  Paperwork filled out for her insurance today. Information included limitations with job and the tasks that she is able to do Note provided keeping out of work until 10/28/2015 She will follow-up in January

## 2015-08-19 NOTE — Patient Instructions (Addendum)
Thank you for coming in,   Please follow up in January  Please bring all of your medications with you to each visit.   Sign up for My Chart to have easy access to your labs results, and communication with your Primary care physician   Please feel free to call with any questions or concerns at any time, at [phone]. --Dr. Raeford Razor

## 2015-08-19 NOTE — Assessment & Plan Note (Signed)
Recently diagnosed with H. pylori and has completed treatment - Still taking pantoprazole

## 2015-08-19 NOTE — Assessment & Plan Note (Signed)
Diagnosed with IBS diarrhea predominance Is being followed by GI

## 2015-08-19 NOTE — Progress Notes (Signed)
Subjective:    Elizabeth Diaz - 49 y.o. female MRN [id number]  Date of birth: 1966/07/27  HPI  Elizabeth Diaz is here for chronic knee pain and GI follow-up.  Knee pain  Location: Both knees  Pain started: always hurt but now is everyday, constant  Pain is: sharp Severity: 6-7/10 Medications tried: Tried naproxen and tramadol with no improvement  Recent trauma: sometime her left leg gives out. No locking  Similar pain previously: no  Symptoms Redness:no Swelling:no Fever: no Weakness: feels generalized fatigue  Weight loss: no Rash: no  Loose stools/Encoporesis:  She is also diagnosed with IBS (diarrhea predominance).  She is having concerns and that she remains incontinent and her bilateral knee pain does not allow her for a quick movement to the bathroom when she is having symptoms  Upper abdominal pain and reflux: He suddenly has had an EGD done. She was diagnosed with H. pylori and has completed treatment. Started on pantoprazole 40 mg daily by gastroenterology in October  Health Maintenance:  Health Maintenance Due  Topic Date Due  . PNEUMOCOCCAL POLYSACCHARIDE VACCINE (1) 12/27/1967  . FOOT EXAM  12/27/1975  . OPHTHALMOLOGY EXAM  12/27/1975  . URINE MICROALBUMIN  12/27/1975    -  reports that she quit smoking about 6 years ago. Her smoking use included Cigarettes. She has a 30 pack-year smoking history. She has never used smokeless tobacco. - Review of Systems: Per HPI. - Past Medical History: Patient Active Problem List   Diagnosis Date Noted  . Chronic diarrhea   . Gastroesophageal reflux disease without esophagitis   . Abdominal pain, epigastric   . Prediabetes 06/10/2015  . Annual physical exam 06/10/2015  . Bilateral lower extremity edema 04/08/2015  . Urinary incontinence 04/08/2015  . Dizziness 01/28/2015  . Chronic knee pain 12/07/2014  . Change in bowel habits   . Benign neoplasm of rectum   . Frequent loose stools   . Right-sided  back pain 05/14/2014  . Fecal incontinence 05/06/2014   - Medications: reviewed and updated Current Outpatient Prescriptions  Medication Sig Dispense Refill  . bismuth-metronidazole-tetracycline (PYLERA) 140-125-125 MG capsule Take 3 capsules by mouth 4 (four) times daily -  before meals and at bedtime. 120 capsule 0  . celecoxib (CELEBREX) 200 MG capsule Take 200 mg by mouth 2 (two) times daily.    . cyclobenzaprine (FLEXERIL) 10 MG tablet Take 0.5-1 tablets (5-10 mg total) by mouth 3 (three) times daily as needed for muscle spasms. 60 tablet 0  . furosemide (LASIX) 40 MG tablet Take 1 tablet (40 mg total) by mouth daily as needed for edema. 30 tablet 0  . gabapentin (NEURONTIN) 300 MG capsule Take 2 capsules (600 mg total) by mouth 3 (three) times daily. 90 capsule 3  . HYDROcodone-acetaminophen (NORCO) 5-325 MG tablet Take 1 tablet by mouth every 6 (six) hours as needed. 10 tablet 0  . pantoprazole (PROTONIX) 40 MG tablet Take 1 tablet (40 mg total) by mouth daily. 30 tablet 3  . pantoprazole (PROTONIX) 40 MG tablet Take 1 tablet (40 mg total) by mouth 2 (two) times daily. 20 tablet 0   No current facility-administered medications for this visit.     Review of Systems See HPI     Objective:   Physical Exam BP 143/77 mmHg  Pulse 106  Temp(Src) 98.4 F (36.9 C) (Oral)  Wt 272 lb 3.2 oz (123.469 kg) Gen: NAD, alert, cooperative with exam, HEENT: NCAT, EOMI, clear conjunctiva, CV: Tachycardic  Resp:  non-labored Skin: no rashes, normal turgor  Neuro: no gross deficits.  Psych:  alert and oriented  Spent 30 minutes face to face with greater than 50% of the time counseling.       Assessment & Plan:   Chronic knee pain Orthopedics thinks pain may be coming from her back and obtain MRI. She has follow-up with them today. MRI: showing degenerative changes.  Paperwork filled out for her insurance today. Information included limitations with job and the tasks that she is able  to do Note provided keeping out of work until 10/28/2015 She will follow-up in January   Frequent loose stools Diagnosed with IBS diarrhea predominance Is being followed by GI  Gastroesophageal reflux disease without esophagitis Recently diagnosed with H. pylori and has completed treatment - Still taking pantoprazole

## 2015-08-21 ENCOUNTER — Telehealth: Payer: Self-pay | Admitting: Family Medicine

## 2015-08-21 ENCOUNTER — Encounter: Payer: Self-pay | Admitting: Family Medicine

## 2015-08-21 NOTE — Telephone Encounter (Signed)
Letter placed up front.   Rosemarie Ax, MD PGY-3, [facility] 08/21/2015, 3:48 PM

## 2015-08-26 NOTE — Telephone Encounter (Signed)
Pt informed. Sharon T Saunders, CMA  

## 2015-09-13 ENCOUNTER — Telehealth: Payer: Self-pay | Admitting: Family Medicine

## 2015-09-13 DIAGNOSIS — M25561 Pain in right knee: Secondary | ICD-10-CM

## 2015-09-13 DIAGNOSIS — M25562 Pain in left knee: Principal | ICD-10-CM

## 2015-09-13 DIAGNOSIS — G8929 Other chronic pain: Secondary | ICD-10-CM

## 2015-09-13 NOTE — Telephone Encounter (Signed)
Pt called and needs a refill on her Flexeril called in. jw °

## 2015-09-16 ENCOUNTER — Encounter: Payer: Self-pay | Admitting: *Deleted

## 2015-09-16 MED ORDER — CYCLOBENZAPRINE HCL 10 MG PO TABS: 5.0000 mg | ORAL_TABLET | Freq: Three times a day (TID) | ORAL | Status: DC | PRN

## 2015-10-01 ENCOUNTER — Other Ambulatory Visit: Payer: Self-pay | Admitting: Family Medicine

## 2015-10-01 ENCOUNTER — Ambulatory Visit: Payer: BLUE CROSS/BLUE SHIELD

## 2015-10-01 NOTE — Progress Notes (Signed)
Opened in error

## 2015-10-02 ENCOUNTER — Ambulatory Visit (INDEPENDENT_AMBULATORY_CARE_PROVIDER_SITE_OTHER): Payer: BLUE CROSS/BLUE SHIELD | Admitting: Internal Medicine

## 2015-10-02 ENCOUNTER — Encounter: Payer: Self-pay | Admitting: Internal Medicine

## 2015-10-02 ENCOUNTER — Other Ambulatory Visit: Payer: BLUE CROSS/BLUE SHIELD

## 2015-10-02 VITALS — BP 96/60 | HR 68 | Ht 60.0 in | Wt 288.0 lb

## 2015-10-02 DIAGNOSIS — K580 Irritable bowel syndrome with diarrhea: Secondary | ICD-10-CM | POA: Diagnosis not present

## 2015-10-02 DIAGNOSIS — K219 Gastro-esophageal reflux disease without esophagitis: Secondary | ICD-10-CM | POA: Diagnosis not present

## 2015-10-02 DIAGNOSIS — B9681 Helicobacter pylori [H. pylori] as the cause of diseases classified elsewhere: Secondary | ICD-10-CM | POA: Diagnosis not present

## 2015-10-02 DIAGNOSIS — A048 Other specified bacterial intestinal infections: Secondary | ICD-10-CM

## 2015-10-02 MED ORDER — ELUXADOLINE 100 MG PO TABS: 1.0000 | ORAL_TABLET | Freq: Two times a day (BID) | ORAL | Status: DC

## 2015-10-02 NOTE — Patient Instructions (Signed)
Your physician has requested that you go to the basement for the following lab work before leaving today: H Pylori stool antigen, fecal elastase  We have sent the following medications to your pharmacy for you to pick up at your convenience: Viberzi 100 mg twice daily with food  Continue Pantoprazole (AFTER submitting stool specimen).  Follow up with Dr Hilarie Fredrickson in 3-4 months.

## 2015-10-02 NOTE — Progress Notes (Signed)
Subjective:    Patient ID: Elizabeth Diaz, female    DOB: 09-Feb-1966, 50 y.o.   MRN: [id number]  HPI Elizabeth Diaz is a  50 year old female with chronic loose stools with fecal urgency an upper abdominal pain who is here for follow-up. She was last seen in October 2016 after which she had upper endoscopy. Endoscopy was unremarkable but biopsies positive for H. Pylori gastritis. She was treated with Pylera which she completed. She is using pantoprazole 40 mg which helps with heartburn but she is using this off and on usually 2-3 weeks at a time. She's been off for 2 weeks. It does help controlling heartburn and indigestion. She has still had ongoing issues with 5-10 loose stools per day including accidents and urgency. This is associated with upper and mid abdominal discomfort which is transiently relieved by bowel movement. No definite trigger. She has gained weight and unsure why. Denies lower extremity edema or orthopnea. No fevers or chills. Previously no response to Metamucil.   Review of Systems  as per history of present illness, otherwise negative  Current Medications, Allergies, Past Medical History, Past Surgical History, Family History and Social History were reviewed in [employer] record.     Objective:   Physical Exam BP 96/60 mmHg  Pulse 68  Ht 5' (1.524 m)  Wt 288 lb (130.636 kg)  BMI 56.25 kg/m2  LMP 10/01/2015 Constitutional: Well-developed and well-nourished. No distress. HEENT: Normocephalic and atraumatic. Marland Kitchen Conjunctivae are normal.  No scleral icterus. Neck: Neck supple. Trachea midline. Cardiovascular: Normal rate, regular rhythm and intact distal pulses. No M/R/G Pulmonary/chest: Effort normal and breath sounds normal. No wheezing, rales or rhonchi. Abdominal: Soft, obese, nontender, nondistended. Bowel sounds active throughout.  Extremities: no clubbing, cyanosis, or edema. Neurological: Alert and oriented to person place and  time. Skin: Skin is warm and dry.  Psychiatric: Normal mood and affect. Behavior is normal.  CBC    Component Value Date/Time   WBC 9.2 06/10/2015 1441   RBC 4.25 06/10/2015 1441   HGB 12.8 06/10/2015 1441   HCT 38.6 06/10/2015 1441   PLT 301 06/10/2015 1441   MCV 90.8 06/10/2015 1441   MCH 30.1 06/10/2015 1441   MCHC 33.2 06/10/2015 1441   RDW 13.7 06/10/2015 1441   LYMPHSABS 2.8 02/14/2014 1607   MONOABS 0.5 02/14/2014 1607   EOSABS 0.4 02/14/2014 1607   BASOSABS 0.0 02/14/2014 1607    CMP     Component Value Date/Time   NA 139 06/10/2015 1441   K 4.2 06/10/2015 1441   CL 104 06/10/2015 1441   CO2 27 06/10/2015 1441   GLUCOSE 94 06/10/2015 1441   BUN 9 06/10/2015 1441   CREATININE 0.63 06/10/2015 1441   CREATININE 0.65 02/14/2014 1607   CALCIUM 8.0* 06/10/2015 1441   PROT 8.0 03/30/2013 1637   ALBUMIN 3.5 03/30/2013 1637   AST 13 03/30/2013 1637   ALT 13 03/30/2013 1637   ALKPHOS 80 03/30/2013 1637   BILITOT 0.3 03/30/2013 1637   GFRNONAA >89 06/10/2015 1441   GFRNONAA >90 02/14/2014 1607   GFRAA >89 06/10/2015 1441   GFRAA >90 02/14/2014 1607    Celiac panel neg Duodenal bx neg Colon bx neg for MC  Fecal elastase ordered and "collected" but labs says "we never received"     Assessment & Plan:  50 year old female with chronic loose stools with fecal urgency an upper abdominal pain who is here for follow-up.  1. IBS-D --  Ongoing issues with  irritable bowel with diarrhea predominance. Previous workup unrevealing. I'm going to try her on Viberzi 100 g twice a day with food. We discussed possible side effects including bloating, constipation and abdominal pain. If any of these symptoms result she is asked to notify me immediately she voices understanding   2. H. Pylori gastritis --  Treated with Pylera. I've recommended stool antigen to confirm eradication which can be collected at this time as she is already off PPI for greater than 10 days.   3. GERD --   Good response pantoprazole. Can resume 40 mg daily for 2-4 weeks at a time as needed for indigestion.  GERD diet. No Barrett's esophagus on recent endoscopy   3-4 month follow-up, sooner if necessary 25 minutes spent with the patient today. Greater than 50% was spent in counseling and coordination of care with the patient

## 2015-10-04 ENCOUNTER — Encounter: Payer: Self-pay | Admitting: Family Medicine

## 2015-10-04 ENCOUNTER — Ambulatory Visit (INDEPENDENT_AMBULATORY_CARE_PROVIDER_SITE_OTHER): Payer: BLUE CROSS/BLUE SHIELD | Admitting: Family Medicine

## 2015-10-04 VITALS — BP 139/74 | HR 75 | Temp 98.1°F | Ht 60.0 in | Wt 275.2 lb

## 2015-10-04 DIAGNOSIS — K589 Irritable bowel syndrome without diarrhea: Secondary | ICD-10-CM | POA: Diagnosis not present

## 2015-10-04 DIAGNOSIS — Z8659 Personal history of other mental and behavioral disorders: Secondary | ICD-10-CM | POA: Diagnosis not present

## 2015-10-04 DIAGNOSIS — R7303 Prediabetes: Secondary | ICD-10-CM

## 2015-10-04 DIAGNOSIS — G4719 Other hypersomnia: Secondary | ICD-10-CM | POA: Diagnosis not present

## 2015-10-04 DIAGNOSIS — L608 Other nail disorders: Secondary | ICD-10-CM

## 2015-10-04 LAB — POCT GLYCOSYLATED HEMOGLOBIN (HGB A1C): Hemoglobin A1C: 6.1

## 2015-10-04 NOTE — Progress Notes (Signed)
Subjective:    Elizabeth Diaz - 50 y.o. female MRN [id number]  Date of birth: 07-22-1966  HPI  Elizabeth Diaz is here for several issues.  Daytime sleepiness/Poor sleep:  She reports not being able to sleep at night.  She is able to go to sleep but not maintain  Reports snoring and unsure if gasping.  Having tiredness and fatigue during the day. She is unsure if this is related to her medications or poor sleep  She watches tv in bed.  Doesn't have any other electronics in bed with her.  She has seen another provider at her orthopedic office and recommend that she get a sleep study.   Presenting Issue:  Depressed mood   Report of symptoms: reports anhendonia, history of depression and on medication  Duration of CURRENT symptoms: last few months. Took herself off medications 2004  Age of onset of first mood disturbance: 50 years old   Impact on function: able to clean and going to store or activities  Psychiatric History - Diagnoses: Depression  - Hospitalizations:  [facility] for manic depression and [facility]. Several admissions.  - Pharmacotherapy: hasn't taken 2004  - Outpatient therapy: rehab previously   Family history of psychiatric issues: Father's side   Current and history of substance use: history of cocaine and alcohol abuse. Was able to quit 2009. Has been to rehab but she didn't like it. Quit cold [place]   PHQ-9: 21  IBS:  Has been seen by her GI doctor recently and is starting a new medications  Continues to have incontincee  Having more accidents since she has continued knee pain.   Pre-diabetes:  Having numbness in her fingertips and toes.  Last A1c at 6.0   Toe nail and finger nail changes:  This has been an ongoing issue.  Denies any pain or trauma to affected fingers or toes.  She is able to remove her nails with minimal effort.  Has not tried anything for this.   Health Maintenance:  - A1c today  - foot exam today.  Health Maintenance  Due  Topic Date Due  . PNEUMOCOCCAL POLYSACCHARIDE VACCINE (1) 12/27/1967  . OPHTHALMOLOGY EXAM  12/27/1975  . URINE MICROALBUMIN  12/27/1975    -  reports that she quit smoking about 6 years ago. Her smoking use included Cigarettes. She has a 30 pack-year smoking history. She has never used smokeless tobacco. - Review of Systems: Per HPI. - Past Medical History: Patient Active Problem List   Diagnosis Date Noted  . Excessive daytime sleepiness 10/05/2015  . History of depression 10/05/2015  . Nail discoloration 10/05/2015  . Chronic diarrhea   . Gastroesophageal reflux disease without esophagitis   . Abdominal pain, epigastric   . Prediabetes 06/10/2015  . Annual physical exam 06/10/2015  . Bilateral lower extremity edema 04/08/2015  . Urinary incontinence 04/08/2015  . Chronic knee pain 12/07/2014  . Benign neoplasm of rectum   . IBS (irritable bowel syndrome)   . Right-sided back pain 05/14/2014  . Fecal incontinence 05/06/2014   - Medications: reviewed and updated Current Outpatient Prescriptions  Medication Sig Dispense Refill  . celecoxib (CELEBREX) 200 MG capsule Take 200 mg by mouth 2 (two) times daily.    . cyclobenzaprine (FLEXERIL) 10 MG tablet Take 0.5-1 tablets (5-10 mg total) by mouth 3 (three) times daily as needed for muscle spasms. 60 tablet 0  . Eluxadoline (VIBERZI) 100 MG TABS Take 1 tablet by mouth 2 (two) times daily. [place]  tablet 3  . furosemide (LASIX) 40 MG tablet Take 1 tablet (40 mg total) by mouth daily as needed for edema. 30 tablet 0  . gabapentin (NEURONTIN) 300 MG capsule Take 2 capsules (600 mg total) by mouth 3 (three) times daily. 90 capsule 3  . pantoprazole (PROTONIX) 40 MG tablet Take 1 tablet (40 mg total) by mouth daily. 30 tablet 3  . pantoprazole (PROTONIX) 40 MG tablet Take 1 tablet (40 mg total) by mouth 2 (two) times daily. 20 tablet 0   No current facility-administered medications for this visit.     Review of Systems See HPI       Objective:   Physical Exam BP 139/74 mmHg  Pulse 75  Temp(Src) 98.1 F (36.7 C) (Oral)  Ht 5' (1.524 m)  Wt 275 lb 3.2 oz (124.83 kg)  BMI 53.75 kg/m2  LMP 10/01/2015 Gen: NAD, alert, cooperative with exam, morbidly obese  HEENT: NCAT, EOMI, clear conjunctiva,supple neck CV: regular rate, no edema, capillary refill brisk  Resp:  non-labored Skin: no rashes, normal turgor  Neuro: no gross deficits.  Psych: no SI or HI,  alert and oriented      Assessment & Plan:   Excessive daytime sleepiness She is morbidly obese which could contribute to OSA She is also having problems with sleep secondary to pain  - order sleep study  - encouraged a good sleep hygiene.   IBS (irritable bowel syndrome) Recently seen by GI and they are initiating a new medication.   Prediabetes Controlled  May need to initiate medication if she is unable to exercise secondary to pain.  - A1c today   History of depression Reports that she has had more of a depressed mood since her ongoing pain symptoms  Has history of inpatient management but sounds more like a bipolar aspect than just depression  Consulted integrated behavioral health while in clinic.    Nail discoloration Having finger and toe nail discoloration as seen in the picture  Concern for fungal infection as no trauma  - if she wants work up then can consider biopsy  - if fungal needs liver enzymes before initiating anti-fungal.

## 2015-10-04 NOTE — Progress Notes (Signed)
Dr. Angelia Mould requested a [place].   See Dr. Doristine Locks note for psychiatric symptoms and history.   Assessment / Plan / Recommendations:Patient presented for current symptoms of depression. However, she states that she also has bee diagnosed with Bipolar Disorder in the past, and sometimes has manic episodes where she is impulsive and irritable and has elevated mood. Patient's chief complaint is that she has anhedonia, and is having difficulty initiating activities that she used to enjoy. She is also physically limited by chronic knee and back pain, which is a major source of stress for her. Patient is unwilling to take medication at this time - she stated that she believes it makes you numb and "lifeless". [facility] discussed behavioral activation; patient stated that she would like to paint and read poetry. [facility] asked her to think of one activity that she could do this week. Patient stated that she could not think of an activity at the moment, but will think of one and do it prior to her next appointment. She will follow-up with [facility] in two weeks.

## 2015-10-04 NOTE — Patient Instructions (Signed)
Thank you for coming in,   Please let me know if you don't hear from the sleep study.   I will call or a send a letter with the results from the a1c today.   Please bring all of your medications with you to each visit.   Sign up for My Chart to have easy access to your labs results, and communication with your Primary care physician   Please feel free to call with any questions or concerns at any time, at [phone]. --Dr. Raeford Razor

## 2015-10-05 DIAGNOSIS — R4586 Emotional lability: Secondary | ICD-10-CM

## 2015-10-05 DIAGNOSIS — L608 Other nail disorders: Secondary | ICD-10-CM | POA: Insufficient documentation

## 2015-10-05 DIAGNOSIS — G4719 Other hypersomnia: Secondary | ICD-10-CM | POA: Insufficient documentation

## 2015-10-05 DIAGNOSIS — Z8659 Personal history of other mental and behavioral disorders: Secondary | ICD-10-CM | POA: Insufficient documentation

## 2015-10-05 HISTORY — DX: Emotional lability: R45.86

## 2015-10-05 NOTE — Assessment & Plan Note (Signed)
Reports that she has had more of a depressed mood since her ongoing pain symptoms  Has history of inpatient management but sounds more like a bipolar aspect than just depression  Consulted integrated behavioral health while in clinic.

## 2015-10-05 NOTE — Assessment & Plan Note (Addendum)
Controlled  May need to initiate medication if she is unable to exercise secondary to pain.  - A1c today

## 2015-10-05 NOTE — Assessment & Plan Note (Signed)
Recently seen by GI and they are initiating a new medication.

## 2015-10-05 NOTE — Assessment & Plan Note (Addendum)
She is morbidly obese which could contribute to OSA She is also having problems with sleep secondary to pain  - order sleep study  - encouraged a good sleep hygiene.

## 2015-10-05 NOTE — Assessment & Plan Note (Signed)
Having finger and toe nail discoloration as seen in the picture  Concern for fungal infection as no trauma  - if she wants work up then can consider biopsy  - if fungal needs liver enzymes before initiating anti-fungal.

## 2015-10-18 ENCOUNTER — Ambulatory Visit: Payer: BLUE CROSS/BLUE SHIELD

## 2015-10-25 ENCOUNTER — Ambulatory Visit: Payer: BLUE CROSS/BLUE SHIELD

## 2015-10-29 ENCOUNTER — Encounter: Payer: Self-pay | Admitting: Family Medicine

## 2015-10-29 ENCOUNTER — Ambulatory Visit (INDEPENDENT_AMBULATORY_CARE_PROVIDER_SITE_OTHER): Payer: BLUE CROSS/BLUE SHIELD | Admitting: Family Medicine

## 2015-10-29 VITALS — BP 142/81 | HR 97 | Temp 98.3°F | Ht 60.0 in | Wt 276.6 lb

## 2015-10-29 DIAGNOSIS — M25561 Pain in right knee: Secondary | ICD-10-CM | POA: Diagnosis not present

## 2015-10-29 DIAGNOSIS — G8929 Other chronic pain: Secondary | ICD-10-CM

## 2015-10-29 NOTE — Assessment & Plan Note (Addendum)
Patient with ongoing knee pain.  She has functional assessment on Feb 1  This will determine her ongoing short term disability  Has follow up with her orthopedist with the consideration of knee replacement.

## 2015-10-29 NOTE — Patient Instructions (Signed)
Thank you for coming in,   Focus on a goal of losing 1 pound per week for the next month.  Please do not use later than 8:00 at night.  Please avoid any kind of soda, sweet tea or junk food.   Please bring all of your medications with you to each visit.   Sign up for My Chart to have easy access to your labs results, and communication with your Primary care physician   Please feel free to call with any questions or concerns at any time, at [phone]. --Dr. Raeford Razor  Diet Recommendations for Diabetes   Starchy (carb) foods include: Bread, rice, pasta, potatoes, corn, crackers, bagels, muffins, all baked goods.   Protein foods include: Meat, fish, poultry, eggs, dairy foods, and beans such as pinto and kidney beans (beans also provide carbohydrate).   1. Eat at least 3 meals and 1-2 snacks per day. Never go more than 4-5 hours while awake without eating.  2. Limit starchy foods to TWO per meal and ONE per snack. ONE portion of a starchy  food is equal to the following:   - ONE slice of bread (or its equivalent, such as half of a hamburger bun).   - 1/2 cup of a "scoopable" starchy food such as potatoes or rice.   - 1 OUNCE (28 grams) of starchy snack foods such as crackers or pretzels (look on label).   - 15 grams of carbohydrate as shown on food label.  3. Both lunch and dinner should include a protein food, a carb food, and vegetables.   - Obtain twice as many veg's as protein or carbohydrate foods for both lunch and dinner.   - Try to keep frozen veg's on hand for a quick vegetable serving.     - Fresh or frozen veg's are best.  4. Breakfast should always include protein.

## 2015-10-29 NOTE — Progress Notes (Signed)
Patient ID: Elizabeth Diaz, female   DOB: 1966-09-24, 50 y.o.   MRN: [id number]   Subjective:   Elizabeth Diaz is a 50 y.o. female with a history of bilateral knee pain, bilateral LE edema, back pain, IBS, GERD and benign neoplasm of rectum. Patient has chronic low back and chronic bilateral knee pain/swelling, for which she sees an orthopedist.  Patient is scheduled to see an orthopedist at 4 pm today.  Patient has been out of work on medical leave due to chronic pain.  She presents to clinic today to request an MD complete a form verifying that she needs to extend her medical leave from work.  Patient denies exacerbation of chronic pain or new weakness in her legs.  Patient denies N/V/D, fever and other signs of systemic illness.  Patient states she is at baseline physical health today.    Review of Systems:  Per HPI. All other systems reviewed and are negative.   PMH, PSH, Medications, Allergies, and FmHx reviewed and updated in EMR.  Social History: Former smoker  Objective:  BP 142/81 mmHg  Pulse 97  Temp(Src) 98.3 F (36.8 C) (Oral)  Ht 5' (1.524 m)  Wt 276 lb 9.6 oz (125.465 kg)  BMI 54.02 kg/m2  LMP 10/01/2015  Gen:  50 y.o. female in NAD CV: RRR, no MRG, no JVD, +2 radial pulses and +1 dorsalis pedis pulses bilaterally Resp: Non-labored, CTAB, no wheezes noted Abd: Soft, NTND, BS present, no guarding MSK:  Bilateral trace pre-tibial edema which patient states is baseline for her.  Full ROM in upper and lower extremities, strength intact.  No pitting edema noted to either knees.  It is difficult to appreciate edema in knees due to patient's body habitus. Neuro: Alert and oriented, speech normal.  Light touch sensation normal.     Assessment & Plan:   Elizabeth Diaz is a 50 y.o. female here to request an extension of her medical leave from work.  Patient complains of chronic low back and bilateral knee pain.  Patient is being followed by two orthopedists for these issues.     1. Chronic knee pain, bilateral Patient has pain medications as prescribed by orthopedist.  Discussed weight loss with patient with a goal of 1 lb a week.  Patient to undertake weight loss efforts and continue to follow-up with orthopedists.    Sherron Ales, NP student [facility]  10/29/2015 2:26 PM

## 2015-10-29 NOTE — Assessment & Plan Note (Signed)
Improvement in this area would leave to greater improvement in her areas of pain. Her musculoskeletal pain complicates her ability to exercise We will try to focus on her diet and goals - Advised to not eat later than 8 PM - advised to write down a specific goal such as losing 1 pound per week. She thinks she will be able to accomplish this.  - she has not looked into the [facility] yet. Advised to look into it. - may need to consider starting phentermine if goals are not accomplished over a couple of months.   - consider discussing about bariatric surgery if goals are not met.

## 2015-10-30 ENCOUNTER — Ambulatory Visit: Payer: BLUE CROSS/BLUE SHIELD | Attending: Family Medicine

## 2015-10-30 DIAGNOSIS — M25512 Pain in left shoulder: Secondary | ICD-10-CM | POA: Insufficient documentation

## 2015-10-30 DIAGNOSIS — M545 Low back pain, unspecified: Secondary | ICD-10-CM

## 2015-10-30 DIAGNOSIS — M25562 Pain in left knee: Secondary | ICD-10-CM | POA: Insufficient documentation

## 2015-10-30 DIAGNOSIS — R29898 Other symptoms and signs involving the musculoskeletal system: Secondary | ICD-10-CM | POA: Insufficient documentation

## 2015-10-30 DIAGNOSIS — M25561 Pain in right knee: Secondary | ICD-10-CM | POA: Diagnosis present

## 2015-10-30 DIAGNOSIS — M25511 Pain in right shoulder: Secondary | ICD-10-CM | POA: Insufficient documentation

## 2015-10-30 DIAGNOSIS — R269 Unspecified abnormalities of gait and mobility: Secondary | ICD-10-CM | POA: Diagnosis present

## 2015-10-30 NOTE — Therapy (Signed)
[place], [place], [address] Phone: [phone]   Fax:  [phone]  Physical Therapy Evaluation  Patient Details  Name: CLATIE WASKO MRN: [id number] Date of Birth: Jun 30, 1966 Referring Provider: Tia Alert, MD  Encounter Date: 10/30/2015      PT End of Session - 10/30/15 1303    Visit Number 1   Number of Visits 1   Authorization Type BCBS   PT Start Time 0845   PT Stop Time 1225   PT Time Calculation (min) 220 min   Activity Tolerance Patient limited by pain   Behavior During Therapy [facility] for tasks assessed/performed      Past Medical History  Diagnosis Date  . Pneumothorax   . Arthritis   . Numbness     left leg  . Hyperplastic colon polyp   . Diverticulosis   . Obesity   . Seizures (HCC)     hx of, no seizures last few years  . Schatzki's ring     Past Surgical History  Procedure Laterality Date  . Chest tube insertion    . Tubal ligation    . Colonoscopy with propofol N/A 11/01/2014    Procedure: COLONOSCOPY WITH PROPOFOL;  Surgeon: Jerene Bears, MD;  Location: [facility];  Service: Gastroenterology;  Laterality: N/A;  . Esophagogastroduodenoscopy (egd) with propofol N/A 07/29/2015    Procedure: ESOPHAGOGASTRODUODENOSCOPY (EGD) WITH PROPOFOL;  Surgeon: Jerene Bears, MD;  Location: [facility];  Service: Gastroenterology;  Laterality: N/A;    There were no vitals filed for this visit.  Visit Diagnosis:  Bilateral knee pain  Leg weakness, bilateral  Abnormality of gait  Bilateral low back pain without sciatica  Pain of both shoulder joints      Subjective Assessment - 10/30/15 1300    Subjective See FCE report scanned into EPIC   Currently in Pain? --  See pain levels recordeded in FCE report            [facility] PT Assessment - 10/30/15 1301    Assessment   Medical Diagnosis Knee OA   Referring Provider Tia Alert, MD   Precautions   Precautions None   Restrictions   Weight Bearing Restrictions No   Prior Function   Level of Independence Requires assistive device for independence   Cognition   Overall Cognitive Status Within Functional Limits for tasks assessed                           PT Education - 10/30/15 1303    Education provided Yes   Education Details POC   Person(s) Educated Patient   Methods Explanation   Comprehension Verbalized understanding                    Plan - 10/30/15 1303    Clinical Impression Statement Ms Belongia completed the FCE with work level rating of sedentary. See FCE report scanned into EPIC   PT Next Visit Plan Fax FCE report to MD . No follow up.    Consulted and Agree with Plan of Care Patient      See FCE report for results scanned into EPIC   Problem List Patient Active Problem List   Diagnosis Date Noted  . Morbid obesity ([place]) 10/29/2015  . Excessive daytime sleepiness 10/05/2015  . History of depression 10/05/2015  . Nail discoloration 10/05/2015  . Chronic diarrhea   . Gastroesophageal reflux disease without esophagitis   .  Abdominal pain, epigastric   . Prediabetes 06/10/2015  . Annual physical exam 06/10/2015  . Bilateral lower extremity edema 04/08/2015  . Urinary incontinence 04/08/2015  . Chronic knee pain 12/07/2014  . Benign neoplasm of rectum   . IBS (irritable bowel syndrome)   . Right-sided back pain 05/14/2014  . Fecal incontinence 05/06/2014    Darrel Hoover PT 10/30/2015, 1:06 PM  [facility] [address] [place], [place], [address] Phone: [phone]   Fax:  [phone]  Name: LINDER TRUELOVE MRN: [id number] Date of Birth: 1966/04/02

## 2015-10-30 NOTE — Patient Instructions (Signed)
She is to rest but still be active and use medication, cold/heat for post testing soreness/pain.

## 2015-11-07 ENCOUNTER — Telehealth: Payer: Self-pay | Admitting: Family Medicine

## 2015-11-07 ENCOUNTER — Other Ambulatory Visit: Payer: Self-pay | Admitting: Family Medicine

## 2015-11-07 DIAGNOSIS — G8929 Other chronic pain: Secondary | ICD-10-CM

## 2015-11-07 DIAGNOSIS — M25561 Pain in right knee: Secondary | ICD-10-CM

## 2015-11-07 DIAGNOSIS — M25562 Pain in left knee: Principal | ICD-10-CM

## 2015-11-07 MED ORDER — CYCLOBENZAPRINE HCL 10 MG PO TABS: 5.0000 mg | ORAL_TABLET | Freq: Three times a day (TID) | ORAL | Status: DC | PRN

## 2015-11-07 NOTE — Telephone Encounter (Signed)
Pt needs refill on cyclobenzaprine (FLEXERIL) 10 MG tablet Sent to [facility] at [employer], ASA

## 2015-11-07 NOTE — Telephone Encounter (Signed)
Pt calling to check status of paperwork that pt states was given to PCP at last visit on 10/29/15. Sadie Reynolds, ASA

## 2015-11-08 NOTE — Telephone Encounter (Signed)
Spoke to pt. Set appointment for 11/14/2015 @ 2:45 pm. .Barbette Hair

## 2015-11-14 ENCOUNTER — Ambulatory Visit: Payer: BLUE CROSS/BLUE SHIELD | Admitting: Family Medicine

## 2015-11-15 ENCOUNTER — Telehealth: Payer: Self-pay | Admitting: Family Medicine

## 2015-11-15 NOTE — Telephone Encounter (Signed)
Pt needs the papers she gave dr schmitz over 2 weeks ago-provider statement paper. These was for her job.  She has another paper for dr to fill out--also for her job. She will bring that in Monday morning.

## 2015-11-18 ENCOUNTER — Other Ambulatory Visit (HOSPITAL_COMMUNITY): Payer: Self-pay | Admitting: Sports Medicine

## 2015-11-18 ENCOUNTER — Other Ambulatory Visit: Payer: Self-pay | Admitting: Sports Medicine

## 2015-11-18 ENCOUNTER — Other Ambulatory Visit: Payer: BLUE CROSS/BLUE SHIELD

## 2015-11-18 DIAGNOSIS — A048 Other specified bacterial intestinal infections: Secondary | ICD-10-CM

## 2015-11-18 DIAGNOSIS — K580 Irritable bowel syndrome with diarrhea: Secondary | ICD-10-CM

## 2015-11-18 DIAGNOSIS — M7989 Other specified soft tissue disorders: Secondary | ICD-10-CM

## 2015-11-18 DIAGNOSIS — M79606 Pain in leg, unspecified: Secondary | ICD-10-CM

## 2015-11-18 DIAGNOSIS — I739 Peripheral vascular disease, unspecified: Secondary | ICD-10-CM

## 2015-11-19 ENCOUNTER — Other Ambulatory Visit (HOSPITAL_COMMUNITY): Payer: Self-pay | Admitting: Sports Medicine

## 2015-11-19 ENCOUNTER — Ambulatory Visit (HOSPITAL_COMMUNITY)
Admission: RE | Admit: 2015-11-19 | Discharge: 2015-11-19 | Disposition: A | Discharge status: Home / Self Care | Payer: BLUE CROSS/BLUE SHIELD | Source: Ambulatory Visit | Attending: Cardiovascular Disease | Admitting: Cardiovascular Disease

## 2015-11-19 DIAGNOSIS — I771 Stricture of artery: Secondary | ICD-10-CM | POA: Insufficient documentation

## 2015-11-19 DIAGNOSIS — R209 Unspecified disturbances of skin sensation: Secondary | ICD-10-CM | POA: Diagnosis not present

## 2015-11-19 DIAGNOSIS — I739 Peripheral vascular disease, unspecified: Secondary | ICD-10-CM | POA: Diagnosis not present

## 2015-11-19 DIAGNOSIS — R938 Abnormal findings on diagnostic imaging of other specified body structures: Secondary | ICD-10-CM | POA: Insufficient documentation

## 2015-11-19 LAB — HELICOBACTER PYLORI  SPECIAL ANTIGEN: H. PYLORI Antigen: NOT DETECTED

## 2015-11-25 LAB — PANCREATIC ELASTASE, FECAL: Pancreatic Elastase-1, Stool: >500 ug/g

## 2015-11-26 ENCOUNTER — Ambulatory Visit: Payer: BLUE CROSS/BLUE SHIELD | Admitting: Family Medicine

## 2015-11-27 ENCOUNTER — Ambulatory Visit (INDEPENDENT_AMBULATORY_CARE_PROVIDER_SITE_OTHER): Payer: BLUE CROSS/BLUE SHIELD | Admitting: Family Medicine

## 2015-11-27 ENCOUNTER — Ambulatory Visit (HOSPITAL_BASED_OUTPATIENT_CLINIC_OR_DEPARTMENT_OTHER): Payer: BLUE CROSS/BLUE SHIELD | Attending: Family Medicine | Admitting: Radiology

## 2015-11-27 ENCOUNTER — Encounter: Payer: Self-pay | Admitting: Family Medicine

## 2015-11-27 VITALS — BP 136/74 | HR 88 | Temp 98.2°F | Ht 60.0 in | Wt 281.0 lb

## 2015-11-27 DIAGNOSIS — R0683 Snoring: Secondary | ICD-10-CM | POA: Diagnosis not present

## 2015-11-27 DIAGNOSIS — G4733 Obstructive sleep apnea (adult) (pediatric): Secondary | ICD-10-CM | POA: Diagnosis not present

## 2015-11-27 DIAGNOSIS — H9191 Unspecified hearing loss, right ear: Secondary | ICD-10-CM | POA: Diagnosis not present

## 2015-11-27 DIAGNOSIS — R5383 Other fatigue: Secondary | ICD-10-CM | POA: Insufficient documentation

## 2015-11-27 DIAGNOSIS — Z6841 Body Mass Index (BMI) 40.0 and over, adult: Secondary | ICD-10-CM | POA: Diagnosis not present

## 2015-11-27 DIAGNOSIS — H9190 Unspecified hearing loss, unspecified ear: Secondary | ICD-10-CM | POA: Insufficient documentation

## 2015-11-27 DIAGNOSIS — G4736 Sleep related hypoventilation in conditions classified elsewhere: Secondary | ICD-10-CM | POA: Diagnosis not present

## 2015-11-27 DIAGNOSIS — M25561 Pain in right knee: Secondary | ICD-10-CM | POA: Diagnosis not present

## 2015-11-27 DIAGNOSIS — G8929 Other chronic pain: Secondary | ICD-10-CM | POA: Diagnosis not present

## 2015-11-27 DIAGNOSIS — G4719 Other hypersomnia: Secondary | ICD-10-CM | POA: Diagnosis present

## 2015-11-27 DIAGNOSIS — Z79899 Other long term (current) drug therapy: Secondary | ICD-10-CM | POA: Diagnosis not present

## 2015-11-27 NOTE — Progress Notes (Signed)
   Subjective:    KALEYAH LYKES - 50 y.o. female MRN [id number]  Date of birth: 09-May-1966  CC knee pain   HPI  SOLAI SARA is here for knee pain .  Chronic knee pain: He is following up in regards to her ongoing knee pain. Pain is severe and constant. She was first seen in 2016 for her pain. She underwent a functional capacity evaluation on February 1. She has failed several conservative therapies She is presenting today to have an extension of her short-term disability. She has been discussing long-term disability with her case manager and denied two times. She has no backup plan if she is not able to get disability. She has received epidural injections with no improvement of her pain. She reports that she had a lower extremity ultrasound performed.  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 Reports roughly it started when she was 16 months old.  She has never had this looked into or had any evaluation. She denies being able to hear from the right ear at all.   SH: former smoker  PMH: Chronic needing pain, fecal incontinence, IBS, prediabetes, GERD  Health Maintenance:  Health Maintenance Due  Topic Date Due  . PNEUMOCOCCAL POLYSACCHARIDE VACCINE (1) 12/27/1967  . OPHTHALMOLOGY EXAM  12/27/1975  . URINE MICROALBUMIN  12/27/1975    Review of Systems See HPI     Objective:   Physical Exam BP 136/74 mmHg  Pulse 88  Temp(Src) 98.2 F (36.8 C) (Oral)  Ht 5' (1.524 m)  Wt 281 lb (127.461 kg)  BMI 54.88 kg/m2  LMP 10/30/2015   Gen: NAD, alert, obese  Spent 30 minutes counseling patient with greater than 50% spent face to face.       Assessment & Plan:   Chronic knee pain Forms completed today  I will have them scanned in and then faxed.  Given letter to withhold from work for the next month.  Inform that she needs to come up with a plan for work during this next month.     Hearing loss Tested her hearing today and there was no response on the right side.  This is a chronic problem.  She doesn't seem too bothered by it.  - can look into further once she gets her knee problems worked out.

## 2015-11-27 NOTE — Assessment & Plan Note (Signed)
Tested her hearing today and there was no response on the right side.  This is a chronic problem.  She doesn't seem too bothered by it.  - can look into further once she gets her knee problems worked out.

## 2015-11-27 NOTE — Assessment & Plan Note (Signed)
Forms completed today  I will have them scanned in and then faxed.  Given letter to withhold from work for the next month.  Inform that she needs to come up with a plan for work during this next month.

## 2015-11-27 NOTE — Patient Instructions (Signed)
Thank you for coming in,   I will scan your documents in to your chart and sent them once scanned.   Please bring all of your medications with you to each visit.   Sign up for My Chart to have easy access to your labs results, and communication with your Primary care physician   Please feel free to call with any questions or concerns at any time, at [phone]. --Dr. Raeford Razor

## 2015-11-28 NOTE — Telephone Encounter (Signed)
Paperwork completed during office visit on 11/27/15.   Rosemarie Ax, MD PGY-3, [facility] 11/28/2015, 10:37 AM

## 2015-12-03 ENCOUNTER — Encounter (HOSPITAL_COMMUNITY): Payer: Self-pay | Admitting: Emergency Medicine

## 2015-12-03 DIAGNOSIS — E669 Obesity, unspecified: Secondary | ICD-10-CM | POA: Diagnosis not present

## 2015-12-03 DIAGNOSIS — Z86010 Personal history of colonic polyps: Secondary | ICD-10-CM | POA: Diagnosis not present

## 2015-12-03 DIAGNOSIS — R0602 Shortness of breath: Secondary | ICD-10-CM | POA: Insufficient documentation

## 2015-12-03 DIAGNOSIS — M1990 Unspecified osteoarthritis, unspecified site: Secondary | ICD-10-CM | POA: Diagnosis not present

## 2015-12-03 DIAGNOSIS — Z87891 Personal history of nicotine dependence: Secondary | ICD-10-CM | POA: Diagnosis not present

## 2015-12-03 DIAGNOSIS — R079 Chest pain, unspecified: Secondary | ICD-10-CM | POA: Diagnosis present

## 2015-12-03 DIAGNOSIS — Z79899 Other long term (current) drug therapy: Secondary | ICD-10-CM | POA: Insufficient documentation

## 2015-12-03 NOTE — ED Notes (Signed)
Pt. reports intermittent left chest pain onset last week , no chest pain at arrival , denies SOB or nausea , mild diaphoresis .

## 2015-12-04 ENCOUNTER — Emergency Department (HOSPITAL_COMMUNITY): Payer: BLUE CROSS/BLUE SHIELD

## 2015-12-04 ENCOUNTER — Emergency Department (HOSPITAL_COMMUNITY)
Admission: EM | Admit: 2015-12-04 | Discharge: 2015-12-04 | Disposition: A | Discharge status: Home / Self Care | Payer: BLUE CROSS/BLUE SHIELD | Attending: Emergency Medicine | Admitting: Emergency Medicine

## 2015-12-04 ENCOUNTER — Encounter (HOSPITAL_COMMUNITY): Payer: Self-pay | Admitting: Radiology

## 2015-12-04 DIAGNOSIS — R079 Chest pain, unspecified: Secondary | ICD-10-CM

## 2015-12-04 LAB — I-STAT TROPONIN, ED
Troponin i, poc: 0 ng/mL (ref 0.00–0.08)
Troponin i, poc: 0 ng/mL (ref 0.00–0.08)

## 2015-12-04 LAB — BASIC METABOLIC PANEL
ANION GAP: 9 (ref 5–15)
BUN: 9 mg/dL (ref 6–20)
CHLORIDE: 103 mmol/L (ref 101–111)
CO2: 27 mmol/L (ref 22–32)
Calcium: 8.9 mg/dL (ref 8.9–10.3)
Creatinine, Ser: 0.66 mg/dL (ref 0.44–1.00)
GFR calc non Af Amer: >60 mL/min (ref >60)
Glucose, Bld: 116 mg/dL — ABNORMAL HIGH (ref 65–99)
Potassium: 4.2 mmol/L (ref 3.5–5.1)
Sodium: 139 mmol/L (ref 135–145)

## 2015-12-04 LAB — CBC
HCT: 39.1 % (ref 36.0–46.0)
HEMOGLOBIN: 12.8 g/dL (ref 12.0–15.0)
MCH: 30 pg (ref 26.0–34.0)
MCHC: 32.7 g/dL (ref 30.0–36.0)
MCV: 91.6 fL (ref 78.0–100.0)
Platelets: 332 10*3/uL (ref 150–400)
RBC: 4.27 MIL/uL (ref 3.87–5.11)
RDW: 13.6 % (ref 11.5–15.5)
WBC: 11.6 10*3/uL — ABNORMAL HIGH (ref 4.0–10.5)

## 2015-12-04 MED ORDER — IOHEXOL 350 MG/ML SOLN
100.0000 mL | Freq: Once | INTRAVENOUS | Status: AC | PRN
  Administered 2015-12-04: 100 mL via INTRAVENOUS

## 2015-12-04 NOTE — ED Notes (Addendum)
Patient transported to CT 

## 2015-12-04 NOTE — ED Notes (Signed)
Will, PA at bedside. 

## 2015-12-04 NOTE — Discharge Instructions (Signed)
Nonspecific Chest Pain  °Chest pain can be caused by many different conditions. There is always a chance that your pain could be related to something serious, such as a heart attack or a blood clot in your lungs. Chest pain can also be caused by conditions that are not life-threatening. If you have chest pain, it is very important to follow up with your health care provider. °CAUSES  °Chest pain can be caused by: °· Heartburn. °· Pneumonia or bronchitis. °· Anxiety or stress. °· Inflammation around your heart (pericarditis) or lung (pleuritis or pleurisy). °· A blood clot in your lung. °· A collapsed lung (pneumothorax). It can develop suddenly on its own (spontaneous pneumothorax) or from trauma to the chest. °· Shingles infection (varicella-zoster virus). °· Heart attack. °· Damage to the bones, muscles, and cartilage that make up your chest wall. This can include: °¨ Bruised bones due to injury. °¨ Strained muscles or cartilage due to frequent or repeated coughing or overwork. °¨ Fracture to one or more ribs. °¨ Sore cartilage due to inflammation (costochondritis). °RISK FACTORS  °Risk factors for chest pain may include: °· Activities that increase your risk for trauma or injury to your chest. °· Respiratory infections or conditions that cause frequent coughing. °· Medical conditions or overeating that can cause heartburn. °· Heart disease or family history of heart disease. °· Conditions or health behaviors that increase your risk of developing a blood clot. °· Having had chicken pox (varicella zoster). °SIGNS AND SYMPTOMS °Chest pain can feel like: °· Burning or tingling on the surface of your chest or deep in your chest. °· Crushing, pressure, aching, or squeezing pain. °· Dull or sharp pain that is worse when you move, cough, or take a deep breath. °· Pain that is also felt in your back, neck, shoulder, or arm, or pain that spreads to any of these areas. °Your chest pain may come and go, or it may stay  constant. °DIAGNOSIS °Lab tests or other studies may be needed to find the cause of your pain. Your health care provider may have you take a test called an ambulatory ECG (electrocardiogram). An ECG records your heartbeat patterns at the time the test is performed. You may also have other tests, such as: °· Transthoracic echocardiogram (TTE). During echocardiography, sound waves are used to create a picture of all of the heart structures and to look at how blood flows through your heart. °· Transesophageal echocardiogram (TEE). This is a more advanced imaging test that obtains images from inside your body. It allows your health care provider to see your heart in finer detail. °· Cardiac monitoring. This allows your health care provider to monitor your heart rate and rhythm in real time. °· Holter monitor. This is a portable device that records your heartbeat and can help to diagnose abnormal heartbeats. It allows your health care provider to track your heart activity for several days, if needed. °· Stress tests. These can be done through exercise or by taking medicine that makes your heart beat more quickly. °· Blood tests. °· Imaging tests. °TREATMENT  °Your treatment depends on what is causing your chest pain. Treatment may include: °· Medicines. These may include: °¨ Acid blockers for heartburn. °¨ Anti-inflammatory medicine. °¨ Pain medicine for inflammatory conditions. °¨ Antibiotic medicine, if an infection is present. °¨ Medicines to dissolve blood clots. °¨ Medicines to treat coronary artery disease. °· Supportive care for conditions that do not require medicines. This may include: °¨ Resting. °¨ Applying heat   or cold packs to injured areas. °¨ Limiting activities until pain decreases. °HOME CARE INSTRUCTIONS °· If you were prescribed an antibiotic medicine, finish it all even if you start to feel better. °· Avoid any activities that bring on chest pain. °· Do not use any tobacco products, including  cigarettes, chewing tobacco, or electronic cigarettes. If you need help quitting, ask your health care provider. °· Do not drink alcohol. °· Take medicines only as directed by your health care provider. °· Keep all follow-up visits as directed by your health care provider. This is important. This includes any further testing if your chest pain does not go away. °· If heartburn is the cause for your chest pain, you may be told to keep your head raised (elevated) while sleeping. This reduces the chance that acid will go from your stomach into your esophagus. °· Make lifestyle changes as directed by your health care provider. These may include: °¨ Getting regular exercise. Ask your health care provider to suggest some activities that are safe for you. °¨ Eating a heart-healthy diet. A registered dietitian can help you to learn healthy eating options. °¨ Maintaining a healthy weight. °¨ Managing diabetes, if necessary. °¨ Reducing stress. °SEEK MEDICAL CARE IF: °· Your chest pain does not go away after treatment. °· You have a rash with blisters on your chest. °· You have a fever. °SEEK IMMEDIATE MEDICAL CARE IF:  °· Your chest pain is worse. °· You have an increasing cough, or you cough up blood. °· You have severe abdominal pain. °· You have severe weakness. °· You faint. °· You have chills. °· You have sudden, unexplained chest discomfort. °· You have sudden, unexplained discomfort in your arms, back, neck, or jaw. °· You have shortness of breath at any time. °· You suddenly start to sweat, or your skin gets clammy. °· You feel nauseous or you vomit. °· You suddenly feel light-headed or dizzy. °· Your heart begins to beat quickly, or it feels like it is skipping beats. °These symptoms may represent a serious problem that is an emergency. Do not wait to see if the symptoms will go away. Get medical help right away. Call your local emergency services (911 in the [place] Do not drive yourself to the hospital. °  °This  information is not intended to replace advice given to you by your health care provider. Make sure you discuss any questions you have with your health care provider. °  °Document Released: 06/24/2005 Document Revised: 10/05/2014 Document Reviewed: 04/20/2014 °Elsevier Interactive Patient Education ©2016 [employer] ° °

## 2015-12-04 NOTE — ED Provider Notes (Signed)
CSN: [id number]     Arrival date & time 12/03/15  2333 History   First MD Initiated Contact with Patient 12/04/15 [phone]     Chief Complaint  Patient presents with  . Chest Pain    Elizabeth Diaz is a 50 y.o. female who presents to the emergency department complaining of intermittent left chest pain for the past week. Patient reports she has fleeting chest pain that lasts approximately 1 minute and will resolve and then return. She is unable to identify alleviating or aggravating factors. She reports she was seen by cardiologist Dr. Einar Gip last week and told her she had a blood clot in her legs. It appears the patient has an arterial blood clot in her right leg. She is not on anticoagulation. She reports chronic pain to her lower extremities that has not worsened. She denies any increased swelling to her legs. She reports some slight shortness of breath currently. She denies dyspnea on exertion. The patient denies fevers, chills, palpitations, abdominal pain, nausea, vomiting, diarrhea, or rashes. The patient is not a smoker. No endogenous estrogen use. No recent long travel. She denies personal or close family history of any blood clotting disorders such as factor V Leiden, protein C or S deficiency.  The history is provided by the patient. No language interpreter was used.    Past Medical History  Diagnosis Date  . Pneumothorax   . Arthritis   . Numbness     left leg  . Hyperplastic colon polyp   . Diverticulosis   . Obesity   . Seizures (HCC)     hx of, no seizures last few years  . Schatzki's ring    Past Surgical History  Procedure Laterality Date  . Chest tube insertion    . Tubal ligation    . Colonoscopy with propofol N/A 11/01/2014    Procedure: COLONOSCOPY WITH PROPOFOL;  Surgeon: Jerene Bears, MD;  Location: [facility];  Service: Gastroenterology;  Laterality: N/A;  . Esophagogastroduodenoscopy (egd) with propofol N/A 07/29/2015    Procedure: ESOPHAGOGASTRODUODENOSCOPY (EGD)  WITH PROPOFOL;  Surgeon: Jerene Bears, MD;  Location: [facility];  Service: Gastroenterology;  Laterality: N/A;   Family History  Problem Relation Age of Onset  . Heart failure Father   . Diabetes Maternal Grandmother   . Diabetes Paternal Grandfather   . Diabetes Father   . Cancer Maternal Grandfather     unknown type   Social History  Substance Use Topics  . Smoking status: Former Smoker -- 1.50 packs/day for 0 years    Types: Cigarettes    Quit date: 05/04/2009  . Smokeless tobacco: Never Used  . Alcohol Use: Yes   OB History    No data available     Review of Systems  Constitutional: Negative for fever and chills.  HENT: Negative for congestion and sore throat.   Eyes: Negative for visual disturbance.  Respiratory: Positive for shortness of breath. Negative for cough.   Cardiovascular: Positive for chest pain. Negative for palpitations and leg swelling.  Gastrointestinal: Negative for nausea, vomiting, abdominal pain and diarrhea.  Genitourinary: Negative for dysuria.  Musculoskeletal: Negative for back pain and neck pain.  Skin: Negative for rash.  Neurological: Negative for syncope, light-headedness and headaches.      Allergies  Review of patient's allergies indicates no known allergies.  Home Medications   Prior to Admission medications   Medication Sig Start Date End Date Taking? Authorizing Provider  amitriptyline (ELAVIL) 75 MG tablet Take 75  mg by mouth at bedtime.   Yes Historical Provider, MD  cyclobenzaprine (FLEXERIL) 10 MG tablet Take 0.5-1 tablets (5-10 mg total) by mouth 3 (three) times daily as needed for muscle spasms. 11/07/15  Yes Rosemarie Ax, MD  Eluxadoline (VIBERZI) 100 MG TABS Take 1 tablet by mouth 2 (two) times daily. 10/02/15  Yes Jerene Bears, MD  furosemide (LASIX) 40 MG tablet Take 1 tablet (40 mg total) by mouth daily as needed for edema. 04/05/15  Yes Robyn Haber, MD  gabapentin (NEURONTIN) 300 MG capsule Take 2 capsules (600 mg  total) by mouth 3 (three) times daily. 07/09/15  Yes Vivi Barrack, MD   BP 141/74 mmHg  Pulse 97  Temp(Src) 97.7 F (36.5 C) (Oral)  Resp 19  Ht 5' (1.524 m)  Wt 127.574 kg  BMI 54.93 kg/m2  SpO2 100%  LMP 11/05/2015 (Approximate) Physical Exam  Constitutional: She appears well-developed and well-nourished. No distress.  Nontoxic appearing. Obese female.  HENT:  Head: Normocephalic and atraumatic.  Mouth/Throat: Oropharynx is clear and moist.  Eyes: Conjunctivae are normal. Pupils are equal, round, and reactive to light. Right eye exhibits no discharge. Left eye exhibits no discharge.  Neck: Normal range of motion. Neck supple. No JVD present. No tracheal deviation present.  Cardiovascular: Normal rate, regular rhythm, normal heart sounds and intact distal pulses.  Exam reveals no gallop and no friction rub.   No murmur heard. Bilateral radial and dorsalis pedis pulses are palpable. Good capillary refill to her distal fingertips and toes.  Pulmonary/Chest: Effort normal and breath sounds normal. No respiratory distress. She has no wheezes. She has no rales. She exhibits tenderness.  Lungs are clear to auscultation bilaterally. Patient has anterior chest wall tenderness to palpation that does not completely reproduce her chest pain. Symmetric chest expansion bilaterally.  Abdominal: Soft. There is no tenderness. There is no guarding.  Musculoskeletal: Normal range of motion. She exhibits no edema or tenderness.  No lower extremity edema or tenderness. Lower extremities are warm. Good capillary refill.  Lymphadenopathy:    She has no cervical adenopathy.  Neurological: She is alert. Coordination normal.  Skin: Skin is warm and dry. No rash noted. She is not diaphoretic. No erythema. No pallor.  Psychiatric: She has a normal mood and affect. Her behavior is normal.  Nursing note and vitals reviewed.   ED Course  Procedures (including critical care time) Labs Review Labs Reviewed   BASIC METABOLIC PANEL - Abnormal; Notable for the following:    Glucose, Bld 116 (*)    All other components within normal limits  CBC - Abnormal; Notable for the following:    WBC 11.6 (*)    All other components within normal limits  I-STAT TROPOININ, ED  Randolm Idol, ED    Imaging Review Dg Chest 2 View  12/04/2015  CLINICAL DATA:  Right-sided chest pain for 1 week. EXAM: CHEST  2 VIEW COMPARISON:  04/11/2008 FINDINGS: Shallow inspiration. Normal heart size and pulmonary vascularity. Linear scarring or atelectasis in the mid and lower lungs. No focal consolidation. No blunting of costophrenic angles. No pneumothorax. Degenerative changes in the spine. IMPRESSION: Linear scarring or atelectasis in both mid and lower lung zones. No focal consolidation or active disease. Electronically Signed   By: Lucienne Capers M.D.   On: 12/04/2015 00:24   Ct Angio Chest Pe W/cm &/or Wo Cm  12/04/2015  CLINICAL DATA:  Left-sided chest pain for 1 week, worsening. Shortness of breath ongoing for 6  months. EXAM: CT ANGIOGRAPHY CHEST WITH CONTRAST TECHNIQUE: Multidetector CT imaging of the chest was performed using the standard protocol during bolus administration of intravenous contrast. Multiplanar CT image reconstructions and MIPs were obtained to evaluate the vascular anatomy. CONTRAST:  143mL OMNIPAQUE IOHEXOL 350 MG/ML SOLN COMPARISON:  None. FINDINGS: Mediastinum/Nodes: No filling defects in the pulmonary arteries to suggest pulmonary emboli. Heart is upper limits normal in size. Aorta is normal caliber. No mediastinal, hilar, or axillary adenopathy. Lungs/Pleura: Linear areas of atelectasis in the lower lobes bilaterally. No confluent opacities otherwise. No effusions. Upper abdomen: Imaging into the upper abdomen shows no acute findings. Musculoskeletal: Chest wall soft tissues are unremarkable. No acute bony abnormality. Review of the MIP images confirms the above findings. IMPRESSION: No evidence of  pulmonary embolus. Areas of atelectasis in the lower lobes bilaterally. Electronically Signed   By: Rolm Baptise M.D.   On: 12/04/2015 08:58   I have personally reviewed and evaluated these images and lab results as part of my medical decision-making.   EKG Interpretation   Date/Time:  Tuesday December 03 2015 23:38:39 EST Ventricular Rate:  93 PR Interval:  118 QRS Duration: 86 QT Interval:  372 QTC Calculation: 462 R Axis:   48 Text Interpretation:  Normal sinus rhythm Nonspecific T wave abnormality  Abnormal ECG No significant change since last tracing Confirmed by Glynn Octave [phone]) on 12/04/2015 6:31:44 AM      Filed Vitals:   12/04/15 0630 12/04/15 0645 12/04/15 0723 12/04/15 0924  BP: 103/55 102/27 107/62 141/74  Pulse: 79 79 78 97  Temp:   97.5 F (36.4 C) 97.7 F (36.5 C)  TempSrc:   Oral Oral  Resp: 16 20 21 19   Height:      Weight:      SpO2: 99% 98% 98% 100%     MDM   Final diagnoses:  Chest pain, unspecified chest pain type   This is a 50 y.o. female who presents to the emergency department complaining of intermittent left chest pain for the past week. Patient reports she has fleeting chest pain that lasts approximately 1 minute and will resolve and then return. She is unable to identify alleviating or aggravating factors. She reports she was seen by cardiologist Dr. Einar Gip last week and told her she had a blood clot in her legs. It appears the patient has an arterial blood clot in her right leg. She is not on anticoagulation. She reports chronic pain to her lower extremities that has not worsened. She denies any increased swelling to her legs. She reports some slight shortness of breath currently. On exam the patient is afebrile nontoxic appearing. Her lungs clear auscultation bilaterally. She has some anterior chest wall tenderness to palpation that she reports does not completely reproduce her chest pain. Patient's lower extremities are warm and had good  capillary refill. Patient's initial troponin is 0. BMP and CBC are unremarkable. EKG shows no significant change from her last tracing. No STEMI. We will obtain CT angiogram of her chest to rule out PE with patient's history of arterial clot in leg and check delta troponin.   Patient's delta troponin is 0. CT angiogram of her chest was negative for PE.   At reevaluation the patient is sleeping in the room initially. She has an oxygen saturation of 100% on room air. When I awake her she reports she is chest pain free. No SOB. I advised of test results. Will discharge with close follow up with her  cardiologist Dr. Einar Gip and PCP. I discussed her concerns percent return precautions. I advised the patient to follow-up with their primary care provider this week. I advised the patient to return to the emergency department with new or worsening symptoms or new concerns. The patient verbalized understanding and agreement with plan.     This patient was discussed with Dr. Claudine Mouton who agrees with assessment and plan.    Waynetta Pean, PA-C 12/04/15 1002  Everlene Balls, MD 12/04/15 [phone]

## 2015-12-08 DIAGNOSIS — G4719 Other hypersomnia: Secondary | ICD-10-CM | POA: Diagnosis not present

## 2015-12-08 NOTE — Progress Notes (Signed)
  Patient Name: Elizabeth Diaz, Elizabeth Diaz Date: 11/27/2015 Gender: Female D.O.B: 1966-04-24 Age (years): 49 Referring Provider: Clearance Coots Height (inches): 30 Interpreting Physician: Baird Lyons MD, ABSM Weight (lbs): 275 RPSGT: Carolin Coy BMI: 55 MRN: [id number] Neck Size: 15.00 CLINICAL INFORMATION Sleep Study Type: NPSG Indication for sleep study: Daytime Fatigue, Morbid Obesity, Snoring Epworth Sleepiness Score: 5  SLEEP STUDY TECHNIQUE As per the AASM Manual for the Scoring of Sleep and Associated Events v2.3 (April 2016) with a hypopnea requiring 4% desaturations. The channels recorded and monitored were frontal, central and occipital EEG, electrooculogram (EOG), submentalis EMG (chin), nasal and oral airflow, thoracic and abdominal wall motion, anterior tibialis EMG, snore microphone, electrocardiogram, and pulse oximetry.  MEDICATIONS Patient's medications include: charted for review Medications self-administered by patient during sleep study : .GABAPENTIN, viberzi, AMITRIPTYLINE, FLEXERIL.  SLEEP ARCHITECTURE The study was initiated at 9:51:39 PM and ended at 4:23:59 AM. Sleep onset time was 1.4 minutes and the sleep efficiency was 86.7%. The total sleep time was 340.0 minutes. Stage REM latency was 223.0 minutes. The patient spent 8.08% of the night in stage N1 sleep, 81.33% in stage N2 sleep, 0.00% in stage N3 and 10.59% in REM. Alpha intrusion was absent. Supine sleep was 19.54%. Wake afteer sleep onset 51 minutes  RESPIRATORY PARAMETERS The overall apnea/hypopnea index (AHI) was 7.1 per hour. There were 8 total apneas, including 8 obstructive, 0 central and 0 mixed apneas. There were 32 hypopneas and 8 RERAs. The AHI during Stage REM sleep was 18.3 per hour. AHI while supine was 1.8 per hour. The mean oxygen saturation was 94.53%. The minimum SpO2 during sleep was 80.00%. Moderate snoring was noted during this study.  CARDIAC DATA The 2 lead EKG  demonstrated sinus rhythm. The mean heart rate was 95.46 beats per minute. Other EKG findings include: None.  LEG MOVEMENT DATA The total PLMS were 186 with a resulting PLMS index of 32.83. Associated arousal with leg movement index was 0.4 .  IMPRESSIONS - Mild obstructive sleep apnea occurred during this study (AHI = 7.1/h). - No significant central sleep apnea occurred during this study (CAI = 0.0/h). - Moderate oxygen desaturation was noted during this study (Min O2 = 80.00%). - The patient snored with Moderate snoring volume. - No cardiac abnormalities were noted during this study. - Moderate periodic limb movements of sleep occurred during the study. No significant associated arousals.  DIAGNOSIS - Obstructive Sleep Apnea (327.23 [G47.33 ICD-10]) - Nocturnal Hypoxemia (327.26 [G47.36 ICD-10])  RECOMMENDATIONS - Very mild obstructive sleep apnea. Return to discuss treatment options. - Avoid alcohol, sedatives and other CNS depressants that may worsen sleep apnea and disrupt normal sleep architecture. - Sleep hygiene should be reviewed to assess factors that may improve sleep quality. - Weight management and regular exercise should be initiated or continued if appropriate.   Deneise Lever Diplomate, American Board of Sleep Medicine  ELECTRONICALLY SIGNED ON:  12/08/2015, 3:42 PM [place] PH: [phone]   FX: [phone] [place]

## 2015-12-11 ENCOUNTER — Ambulatory Visit: Payer: BLUE CROSS/BLUE SHIELD | Admitting: Internal Medicine

## 2015-12-12 ENCOUNTER — Ambulatory Visit (INDEPENDENT_AMBULATORY_CARE_PROVIDER_SITE_OTHER): Payer: BLUE CROSS/BLUE SHIELD | Admitting: Internal Medicine

## 2015-12-12 ENCOUNTER — Encounter: Payer: Self-pay | Admitting: Licensed Clinical Social Worker

## 2015-12-12 VITALS — BP 135/81 | HR 83 | Temp 97.9°F | Ht 60.0 in | Wt 282.5 lb

## 2015-12-12 DIAGNOSIS — G8929 Other chronic pain: Secondary | ICD-10-CM | POA: Diagnosis not present

## 2015-12-12 DIAGNOSIS — R600 Localized edema: Secondary | ICD-10-CM

## 2015-12-12 DIAGNOSIS — M25561 Pain in right knee: Secondary | ICD-10-CM

## 2015-12-12 DIAGNOSIS — M25562 Pain in left knee: Secondary | ICD-10-CM

## 2015-12-12 DIAGNOSIS — I10 Essential (primary) hypertension: Secondary | ICD-10-CM | POA: Diagnosis not present

## 2015-12-12 DIAGNOSIS — G4733 Obstructive sleep apnea (adult) (pediatric): Secondary | ICD-10-CM

## 2015-12-12 DIAGNOSIS — R079 Chest pain, unspecified: Secondary | ICD-10-CM | POA: Insufficient documentation

## 2015-12-12 DIAGNOSIS — R0789 Other chest pain: Secondary | ICD-10-CM

## 2015-12-12 DIAGNOSIS — G4719 Other hypersomnia: Secondary | ICD-10-CM

## 2015-12-12 DIAGNOSIS — Z6841 Body Mass Index (BMI) 40.0 and over, adult: Secondary | ICD-10-CM

## 2015-12-12 HISTORY — DX: Localized edema: R60.0

## 2015-12-12 HISTORY — DX: Other chest pain: R07.89

## 2015-12-12 MED ORDER — GABAPENTIN 300 MG PO CAPS: ORAL_CAPSULE | ORAL | Status: DC

## 2015-12-12 MED ORDER — DICLOFENAC SODIUM 1 % TD GEL: 2.0000 g | Freq: Four times a day (QID) | TRANSDERMAL | Status: DC

## 2015-12-12 MED ORDER — CYCLOBENZAPRINE HCL 10 MG PO TABS: 5.0000 mg | ORAL_TABLET | Freq: Three times a day (TID) | ORAL | Status: DC | PRN

## 2015-12-12 MED ORDER — FUROSEMIDE 40 MG PO TABS: 40.0000 mg | ORAL_TABLET | Freq: Every day | ORAL | Status: DC | PRN

## 2015-12-12 NOTE — Assessment & Plan Note (Signed)
Etiology of patient's knee pain unclear given negative Xrays.  She has had disability paperwork filled out previously by [facility], and now presents to [facility] asking for paperwork to be filled out again.  Family Med's last visit notes out of work excuse extended by 1 month, during which time patient was to come up with plan for returning to work.  She has not come up with a plan, only saying that she can't do her job because she cannot stand.  Will adjust her medications and extend paperwork by 1 month, but patient counseled her paperwork will not continue to be filled out if she does not come up with and execute a plan for returning to work. - Refill Flexeril - Gabapentin 600 mg every morning and afternoon, 900 mg every evening.

## 2015-12-12 NOTE — Progress Notes (Signed)
Patient ID: Elizabeth Diaz, female   DOB: [birth year], 50 y.o.   MRN: [id number]   Subjective:   Patient ID: Elizabeth Diaz female   DOB: 1966-05-14 50 y.o.   MRN: [id number]  HPI: [name] is a 50 y.o. with PMH as below, here to transition care from [facility].  Please see Problem-Based charting for the status of the patient's chronic medical issues.  Patient has a h/o morbid obesity and bilateral knee pain.  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  At her job, she must stand for 6 hours, but she can only stand for 30 minutes before she has to sit.  She is requesting disability paperwork.  Xrays in 2016 did not show changes of OA.  She has been to PT from June-August.  She is followed by Dr. Paulla Fore (Ortho) and Dr. Ernestina Patches (Ortho for knee injections), but she reports no improvement.  She has been having HAs for the last 3 weeks, described as constant, sharp, dull pains across her forehead.  She was recently diagnosed with OSA but has not followed up for her CPAP.  She denies vision changes or neck pain.  She has intermittent left sided, dull, hard, sharp chest pains, usually when sitting down.  It is relieved by rubbing her chest.  She is followed by a Cardiologist who is planning stress test tomorrow.  She endorses intermittent SOB and chronic diarrhea.   Past Medical History  Diagnosis Date  . Pneumothorax   . Arthritis   . Numbness     left leg  . Hyperplastic colon polyp   . Diverticulosis   . Obesity   . Seizures (HCC)     hx of, no seizures last few years  . Schatzki's ring    Current Outpatient Prescriptions  Medication Sig Dispense Refill  . amitriptyline (ELAVIL) 75 MG tablet Take 75 mg by mouth at bedtime.    . cyclobenzaprine (FLEXERIL) 10 MG tablet Take 0.5-1 tablets (5-10 mg total) by mouth 3 (three) times daily as needed for muscle spasms. 60 tablet 0  . Eluxadoline (VIBERZI) 100 MG TABS Take 1 tablet by  mouth 2 (two) times daily. 60 tablet 3  . furosemide (LASIX) 40 MG tablet Take 1 tablet (40 mg total) by mouth daily as needed for edema. 30 tablet 0  . gabapentin (NEURONTIN) 300 MG capsule Take 2 capsules (600 mg total) by mouth 3 (three) times daily. 90 capsule 3   No current facility-administered medications for this visit.   Family History  Problem Relation Age of Onset  . Heart failure Father   . Diabetes Maternal Grandmother   . Diabetes Paternal Grandfather   . Diabetes Father   . Cancer Maternal Grandfather     unknown type   Social History   Social History  . Marital Status: Single    Spouse Name: N/A  . Number of Children: 2  . Years of Education: N/A   Occupational History  . [employer]    Social History Main Topics  . Smoking status: Former Smoker -- 1.50 packs/day for 0 years    Types: Cigarettes    Quit date: 05/04/2009  . Smokeless tobacco: Never Used  . Alcohol Use: Yes  . 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 Haven't used any in two years.   . Sexual Activity:    Partners: Female   Other Topics Concern  .  Not on file   Social History Narrative   Lives in [place]    Works in [employer] at [employer] group)   Hobbies: listening to music. Bowling.    Review of Systems: Pertinent items noted in HPI and remainder of comprehensive ROS otherwise negative. Objective:  Physical Exam: There were no vitals filed for this visit. ,Physical Exam  Constitutional: She is oriented to person, place, and time.  Morbidly obese, sitting chair, pleasant, NAD  HENT:  Head: Normocephalic and atraumatic.  Eyes: EOM are normal. No scleral icterus.  Neck: No tracheal deviation present.  Cardiovascular: Normal rate, regular rhythm, normal heart sounds and intact distal pulses.   Pulmonary/Chest: Effort normal and breath sounds normal. No stridor. No respiratory distress. She has no wheezes.  Abdominal: She exhibits no distension. There  is no tenderness. There is no rebound and no guarding.  Musculoskeletal:  1+ edema bilaterally.  Neurological: She is alert and oriented to person, place, and time.  Skin: Skin is warm and dry.     Assessment & Plan:   Patient and case were discussed with Dr. Dareen Piano.  Please refer to Problem Based charting for further documentation.

## 2015-12-12 NOTE — Patient Instructions (Addendum)
1. Follow up with Sleep Center in order to titrate CPAP. ([place]; [place]: [phone] FX: [phone]). 2. Attend Bariatric Surgery class and follow up with bariatric surgeon. 3. Apply Voltaren gel to your knees 4 times daily as needed. 4. Take Gabapentin 600 mg every morning and afternoon. Take 900 mg every evening. 5. Follow up with your Cardiologist.  Calorie Counting for Weight Loss Calories are energy you get from the things you eat and drink. Your body uses this energy to keep you going throughout the day. The number of calories you eat affects your weight. When you eat more calories than your body needs, your body stores the extra calories as fat. When you eat fewer calories than your body needs, your body burns fat to get the energy it needs. Calorie counting means keeping track of how many calories you eat and drink each day. If you make sure to eat fewer calories than your body needs, you should lose weight. In order for calorie counting to work, you will need to eat the number of calories that are right for you in a day to lose a healthy amount of weight per week. A healthy amount of weight to lose per week is usually 1-2 lb (0.5-0.9 kg). A dietitian can determine how many calories you need in a day and give you suggestions on how to reach your calorie goal.  WHAT IS MY MY PLAN? My goal is to have __________ calories per day.  If I have this many calories per day, I should lose around __________ pounds per week. WHAT DO I NEED TO KNOW ABOUT CALORIE COUNTING? In order to meet your daily calorie goal, you will need to:  Find out how many calories are in each food you would like to eat. Try to do this before you eat.  Decide how much of the food you can eat.  Write down what you ate and how many calories it had. Doing this is called keeping a food log. WHERE DO I FIND CALORIE INFORMATION? The number of calories in a food can be found on a Nutrition Facts  label. Note that all the information on a label is based on a specific serving of the food. If a food does not have a Nutrition Facts label, try to look up the calories online or ask your dietitian for help. HOW DO I DECIDE HOW MUCH TO EAT? To decide how much of the food you can eat, you will need to consider both the number of calories in one serving and the size of one serving. This information can be found on the Nutrition Facts label. If a food does not have a Nutrition Facts label, look up the information online or ask your dietitian for help. Remember that calories are listed per serving. If you choose to have more than one serving of a food, you will have to multiply the calories per serving by the amount of servings you plan to eat. For example, the label on a package of bread might say that a serving size is 1 slice and that there are 90 calories in a serving. If you eat 1 slice, you will have eaten 90 calories. If you eat 2 slices, you will have eaten 180 calories. HOW DO I KEEP A FOOD LOG? After each meal, record the following information in your food log:  What you ate.  How much of it you ate.  How many calories it had.  Then,  add up your calories. Keep your food log near you, such as in a small notebook in your pocket. Another option is to use a mobile app or website. Some programs will calculate calories for you and show you how many calories you have left each time you add an item to the log. WHAT ARE SOME CALORIE COUNTING TIPS?  Use your calories on foods and drinks that will fill you up and not leave you hungry. Some examples of this include foods like nuts and nut butters, vegetables, lean proteins, and high-fiber foods (more than 5 g fiber per serving).  Eat nutritious foods and avoid empty calories. Empty calories are calories you get from foods or beverages that do not have many nutrients, such as candy and soda. It is better to have a nutritious high-calorie food (such as an  avocado) than a food with few nutrients (such as a bag of chips).  Know how many calories are in the foods you eat most often. This way, you do not have to look up how many calories they have each time you eat them.  Look out for foods that may seem like low-calorie foods but are really high-calorie foods, such as baked goods, soda, and fat-free candy.  Pay attention to calories in drinks. Drinks such as sodas, specialty coffee drinks, alcohol, and juices have a lot of calories yet do not fill you up. Choose low-calorie drinks like water and diet drinks.  Focus your calorie counting efforts on higher calorie items. Logging the calories in a garden salad that contains only vegetables is less important than calculating the calories in a milk shake.  Find a way of tracking calories that works for you. Get creative. Most people who are successful find ways to keep track of how much they eat in a day, even if they do not count every calorie. WHAT ARE SOME PORTION CONTROL TIPS?  Know how many calories are in a serving. This will help you know how many servings of a certain food you can have.  Use a measuring cup to measure serving sizes. This is helpful when you start out. With time, you will be able to estimate serving sizes for some foods.  Take some time to put servings of different foods on your favorite plates, bowls, and cups so you know what a serving looks like.  Try not to eat straight from a bag or box. Doing this can lead to overeating. Put the amount you would like to eat in a cup or on a plate to make sure you are eating the right portion.  Use smaller plates, glasses, and bowls to prevent overeating. This is a quick and easy way to practice portion control. If your plate is smaller, less food can fit on it.  Try not to multitask while eating, such as watching TV or using your computer. If it is time to eat, sit down at a table and enjoy your food. Doing this will help you to start  recognizing when you are full. It will also make you more aware of what and how much you are eating. HOW CAN I CALORIE COUNT WHEN EATING OUT?  Ask for smaller portion sizes or child-sized portions.  Consider sharing an entree and sides instead of getting your own entree.  If you get your own entree, eat only half. Ask for a box at the beginning of your meal and put the rest of your entree in it so you are not tempted to  eat it.  Look for the calories on the menu. If calories are listed, choose the lower calorie options.  Choose dishes that include vegetables, fruits, whole grains, low-fat dairy products, and lean protein. Focusing on smart food choices from each of the 5 food groups can help you stay on track at restaurants.  Choose items that are boiled, broiled, grilled, or steamed.  Choose water, milk, unsweetened iced tea, or other drinks without added sugars. If you want an alcoholic beverage, choose a lower calorie option. For example, a regular margarita can have up to 700 calories and a glass of wine has around 150.  Stay away from items that are buttered, battered, fried, or served with cream sauce. Items labeled "crispy" are usually fried, unless stated otherwise.  Ask for dressings, sauces, and syrups on the side. These are usually very high in calories, so do not eat much of them.  Watch out for salads. Many people think salads are a healthy option, but this is often not the case. Many salads come with bacon, fried chicken, lots of cheese, fried chips, and dressing. All of these items have a lot of calories. If you want a salad, choose a garden salad and ask for grilled meats or steak. Ask for the dressing on the side, or ask for olive oil and vinegar or lemon to use as dressing.  Estimate how many servings of a food you are given. For example, a serving of cooked rice is  cup or about the size of half a tennis ball or one cupcake wrapper. Knowing serving sizes will help you be  aware of how much food you are eating at restaurants. The list below tells you how big or small some common portion sizes are based on everyday objects.  1 oz--4 stacked dice.  3 oz--1 deck of cards.  1 tsp--1 dice.  1 Tbsp-- a Ping-Pong ball.  2 Tbsp--1 Ping-Pong ball.   cup--1 tennis ball or 1 cupcake wrapper.  1 cup--1 baseball.   This information is not intended to replace advice given to you by your health care provider. Make sure you discuss any questions you have with your health care provider.   Document Released: 09/14/2005 Document Revised: 10/05/2014 Document Reviewed: 07/20/2013 Elsevier Interactive Patient Education [employer].

## 2015-12-12 NOTE — Assessment & Plan Note (Signed)
Patient reports chest pain at rest and with exertion.  Unclear etiology at this time, but she reports being followed by Cardiologist with a stress test scheduled for tomorrow.  Will need to follow up results for further management. - Stress test

## 2015-12-12 NOTE — Assessment & Plan Note (Signed)
A/P: OSA is likely cause of her daily HAs.  Patient needs to follow up with Sleep Center and pulmonologist for CPAP titration.  - Sleep center follow up for CPAP - Tylenol, ibuprofen PRN

## 2015-12-12 NOTE — Progress Notes (Signed)
Elizabeth Diaz presents today to establish care at [facility].  Pt requesting to meet with CSW.  CSW met with Elizabeth Diaz prior to her scheduled appointment.  Elizabeth Diaz is currently on short-term disability with her employer and has recently applied for long-term disability.  Elizabeth Diaz is currently in the appeal process with SS Disability.  Pt voices difficulty affording both the specialist copayments and the prescription copayments with her commercial insurance policy..  Elizabeth Diaz inquiring about potential supplemental insurance programs or copay assistance programs.   CSW informed Elizabeth Diaz this worker unaware of any community programs that will assist with specialty copays.  Potential supplemental insurance policy most likely will have premium cost which surpass current specialty copays.  CSW discussed potential to apply for prescription medication copay assistance and if pt had any medications that were cost prohibitive.  Unfortunately, Elizabeth Diaz has stopped taking some medications based on cost and does not have a full listing of all prescribed medications.  CSW informed Elizabeth Diaz, this worker will wait until after pt's appointment today to obtain a working list of all prescription medications and will explore manufacturer programs that work along with [employer].  CSW encouraged Elizabeth Diaz to continue with the appeal process for her disability claim.  Pt thankful her employer has assisted with the process.

## 2015-12-12 NOTE — Assessment & Plan Note (Signed)
Morbid obesity, likely qualifies for bariatric surgery.  Weight loss most important for improving patient's functional status.  Will refer to Nutritionist as well as bariatric surgery group class. [facility] Nutritionist - Bariatric surgery group class

## 2015-12-13 NOTE — Progress Notes (Signed)
Internal Medicine Clinic Attending  Case discussed with Dr. Taylor at the time of the visit.  We reviewed the resident's history and exam and pertinent patient test results.  I agree with the assessment, diagnosis, and plan of care documented in the resident's note. 

## 2015-12-13 NOTE — Addendum Note (Signed)
Addended by: Viviano Simas A on: 12/13/2015 02:02 PM   Modules accepted: Orders

## 2015-12-16 ENCOUNTER — Ambulatory Visit (INDEPENDENT_AMBULATORY_CARE_PROVIDER_SITE_OTHER): Payer: BLUE CROSS/BLUE SHIELD | Admitting: Dietician

## 2015-12-16 DIAGNOSIS — Z713 Dietary counseling and surveillance: Secondary | ICD-10-CM

## 2015-12-16 DIAGNOSIS — Z6841 Body Mass Index (BMI) 40.0 and over, adult: Secondary | ICD-10-CM

## 2015-12-16 DIAGNOSIS — R7303 Prediabetes: Secondary | ICD-10-CM

## 2015-12-16 NOTE — Patient Instructions (Addendum)
Ms. Huguley,   It was nice meeting with you. I hope you feel better soon!   I suggest you call [phone] and ask them your coverage for [phone], [id number] called Medical Nutrition Therapy  If they ask for For what diagnosis: you can say obesity and prediabetes    I am happy to assist you in whatever way I can. If you do not elect to have bariatric surgery, I suggest we meet again soon if you want to pursue weight loss.   Please call anytime,   Debera Lat, Dietitian & Diabetes Educator [phone]

## 2015-12-17 NOTE — Progress Notes (Addendum)
  Medical Nutrition Therapy:  Appt start time: [id number] end time:  1700. Visit # 1  Assessment:  Primary concerns today: weight managment.  Elizabeth Diaz is here for help with her weight. She reports she has always been overweight. 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 She denies any vomiting but does report reflux and chronic diarrhea from IBS. She is planning to attend the bariatric surgery education session tomorrow and is seriously thinking about undergoing bariatric surgery. Has signigicant fluid in her legs(at least 2+ today) and has not picked up her fluid pills. Says partly because of finances and did not explain the other reasons. She says she eats meat 4-5 days a week, hardly uses salt and tries to bake and boil her foods and use fresh and frozen. She enjoyed work and hopes to be abel to return. She is currently out on leave for her knee and back pain.   Preferred Learning Style: No preference indicated  Learning Readiness: Contemplating  ANTHROPOMETRICS: BMI>50- class III obesity WEIGHT HISTORY:lost 60# once when a teen by eating smaller portions, gained weight with pregnancy in the next 10 years, has been gaining weight recently with current weight the highest in her life SLEEP:had sleep study, is looking into CPAP, sleep upright and not well, waking often MEDICATIONS: amitriptylline BLOOD SUGAR:A1C 6.1%, this is not a big concern for her DIETARY INTAKE: Usual eating pattern includes 1-2 meals and ?  snacks per day. Everyday foods include chocolate, sweets, fruit.  Avoided foods include - none discussed today   24-hr recall- not done in detail, she eats shrimp, chicken, fish mostly baked, vegetables, fruit.   Usual physical activity: limited due to pain in back, knees and generalized low energy   Progress Towards Goal(s):  In progress.   Nutritional Diagnosis:  NI-1.5 Excessive energy intake As related to excess  calorie consumption and decreased activity.  As evidenced by her report and BMI of > 50. .    Intervention:  Nutrition education about bariatric surgery, weight loss, nutrition counseling about her ambivalence to . Coordination of care: consider medication for weight loss(saxenda) as appropriate And alternative for amitriptyline and gabapentin that can cause weight gain if patient does not elect to have bariatric surgery  Teaching Method Utilized: Visual,,Auditory,Hands on Handouts given during visit include: AVS Barriers to learning/adherence to lifestyle change:lack of  finances, competing values Demonstrated degree of understanding via:  Teach Back   Monitoring/Evaluation:  Dietary intake, exercise, and body weight prn.

## 2015-12-27 ENCOUNTER — Encounter: Payer: Self-pay | Admitting: Licensed Clinical Social Worker

## 2016-01-01 ENCOUNTER — Ambulatory Visit (INDEPENDENT_AMBULATORY_CARE_PROVIDER_SITE_OTHER): Payer: BLUE CROSS/BLUE SHIELD | Admitting: Internal Medicine

## 2016-01-01 VITALS — BP 121/63 | HR 88

## 2016-01-01 DIAGNOSIS — M25562 Pain in left knee: Secondary | ICD-10-CM

## 2016-01-01 DIAGNOSIS — M25561 Pain in right knee: Secondary | ICD-10-CM

## 2016-01-01 DIAGNOSIS — G8929 Other chronic pain: Secondary | ICD-10-CM | POA: Diagnosis not present

## 2016-01-01 DIAGNOSIS — G4719 Other hypersomnia: Secondary | ICD-10-CM

## 2016-01-01 DIAGNOSIS — G4733 Obstructive sleep apnea (adult) (pediatric): Secondary | ICD-10-CM | POA: Diagnosis not present

## 2016-01-01 DIAGNOSIS — R600 Localized edema: Secondary | ICD-10-CM

## 2016-01-01 NOTE — Progress Notes (Signed)
Patient ID: Elizabeth Diaz, female   DOB: 01/19/1966, 50 y.o.   MRN: [id number]   Subjective:   Patient ID: Elizabeth Diaz female   DOB: 1965/10/15 50 y.o.   MRN: [id number]  HPI: [name] is a 50 y.o. female with PMH as listed below who presents to [facility] for the second time to transition care from [facility]. Patient would rather not specify why she wants to make this transition. She is here for management of her chronic knee pain. Please see problem based charting for status of patient's chronic medical issues.   Past Medical History  Diagnosis Date  . Pneumothorax   . Arthritis   . Numbness     left leg  . Hyperplastic colon polyp   . Diverticulosis   . Obesity   . Seizures (HCC)     hx of, no seizures last few years  . Schatzki's ring    Current Outpatient Prescriptions  Medication Sig Dispense Refill  . amitriptyline (ELAVIL) 75 MG tablet Take 75 mg by mouth at bedtime.    . cyclobenzaprine (FLEXERIL) 10 MG tablet Take 0.5-1 tablets (5-10 mg total) by mouth 3 (three) times daily as needed for muscle spasms. 60 tablet 0  . Eluxadoline (VIBERZI) 100 MG TABS Take 1 tablet by mouth 2 (two) times daily. 60 tablet 3  . furosemide (LASIX) 40 MG tablet Take 1 tablet (40 mg total) by mouth daily as needed for edema. 30 tablet 3  . gabapentin (NEURONTIN) 300 MG capsule Take 600 mg (2 tabs) every morning and afternoon.  Take 900 mg (3 tabs) every evening. 120 capsule 3  . pantoprazole (PROTONIX) 20 MG tablet Take 20 mg by mouth daily.    . diclofenac sodium (VOLTAREN) 1 % GEL Apply 2 g topically 4 (four) times daily. (Patient not taking: Reported on 01/01/2016) 100 g 1   No current facility-administered medications for this visit.   Family History  Problem Relation Age of Onset  . Heart failure Father   . Diabetes Maternal Grandmother   . Diabetes Paternal Grandfather   . Diabetes Father   . Cancer Maternal Grandfather     unknown type   Social History   Social  History  . Marital Status: Single    Spouse Name: N/A  . Number of Children: 2  . Years of Education: N/A   Occupational History  . [employer]    Social History Main Topics  . Smoking status: Former Smoker -- 1.50 packs/day for 0 years    Types: Cigarettes    Quit date: 05/04/2009  . Smokeless tobacco: Never Used  . Alcohol Use: Yes  . 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 Haven't used any in two years.   . Sexual Activity:    Partners: Female   Other Topics Concern  . Not on file   Social History Narrative   Lives in [place]    Works in [employer] at [employer] group)   Hobbies: listening to music. Bowling.    Review of Systems: Review of Systems  Constitutional: Negative for fever, chills and weight loss.  Respiratory: Positive for shortness of breath. Negative for cough.   Cardiovascular: Positive for leg swelling.       Dull reproducible chest ache  Gastrointestinal: Positive for diarrhea and constipation. Negative for nausea, vomiting and abdominal pain.  Musculoskeletal: Positive for back pain and joint pain. Negative for falls.  Neurological: Positive for  tingling. Negative for loss of consciousness.    Objective:  Physical Exam: Filed Vitals:   01/01/16 1542  BP: 121/63  Pulse: 88  SpO2: 97%   Physical Exam  Constitutional: She is oriented to person, place, and time.  Morbidly obese woman, in chair, no acute distress  HENT:  Head: Normocephalic and atraumatic.  Cardiovascular: Normal rate and regular rhythm.   Pulmonary/Chest: Effort normal. No respiratory distress. She has no wheezes. She has no rales. She exhibits no tenderness.  Abdominal: Soft. There is no tenderness.  Musculoskeletal: She exhibits edema. She exhibits no tenderness.       Right knee: She exhibits normal range of motion, no swelling, no effusion and no ecchymosis. No tenderness found.       Left knee: She exhibits normal range of motion, no swelling,  no effusion and no ecchymosis. No tenderness found.  Bilateral pitting edema of lower extermities  Neurological: She is alert and oriented to person, place, and time.  Skin: Skin is warm.  Psychiatric: She has a normal mood and affect.    Assessment & Plan:  Please see problem based assessment and plan.

## 2016-01-01 NOTE — Assessment & Plan Note (Signed)
Patient with mild OSA per sleep study. She is unaware of her Sleep Center appointment for CPAP titration.  A/P: Mild OSA likely cause of her daytime sleepiness. It, and her morbid obesity, is also likely to contributing to her shortness of breath. -Patient advised of her appointment with Pulmonology on 02/07/16 -Encouraged weight loss

## 2016-01-01 NOTE — Assessment & Plan Note (Signed)
Patient has reported working on her diet, trying to cut out fast food, junk food, sodas, and sweet tea. She says she still eats sweets and drinks fruit juices, which she is trying to cut back on. She attended a bariatric surgery group class and was seen by our nutritionist as well. She realizes a lot of her medical conditions are related to her weight.  A/P: Discussed with patient that her weight is currently causing/exacerbating many of her problems. I discussed with her that it will take time, but we can start making small changes today to slowly bring her weight down over the next few months to year. She understands that this will be a long process.  -Continue to encourage weight loss with lifestyle changes, exercise as tolerated -F/u with nutrition as needed

## 2016-01-01 NOTE — Assessment & Plan Note (Signed)
Patient with bilateral lower extremity swelling. She is prescribed Lasix 40 mg daily, but says she is not taking this everyday. She says she has a regular cardiologist and recently had a stress test and Echo, which were both apparently normal. She reports some claudication-like symptoms. Vascular duplex in February showed:  Occluded right proximal SFA with reconstitution via collateral flow in the mid SFA. Heterogeneous plaque in the left SFA, without hemodynamically significant stenosis. Occluded bilateral peroneal arteries, with two vessel run-off, bilaterally  She scheduled for lower extremity angiography next week for right lower extremity vascular disease.   A/P: Likely dependent edema from decreased mobilization due to pain and morbid obesity with contribution from her vascular disease. Apparently her cardiac workup has been normal. -Continue Lasix 40 mg daily -LE angiography as scheduled

## 2016-01-01 NOTE — Patient Instructions (Signed)
It was a pleasure to meet you Ms. Elizabeth Diaz.   Please try Voltaren Gel for your knee pain. I have given you a coupon that can be used at [employer] for a discount. You can also use Arthritis strength Tylenol. Please use compresses daily. You can do this for 15-20 minutes every 2 hours or so. The main thing we can do is to try to bring your weight down. Please continue working on your diet and exercise as tolerated.  Please consider an appointment with your Orthopedic doctor if needed.   Please bring your medications to clinic on your next visit.  Good luck with your procedure next week.

## 2016-01-01 NOTE — Assessment & Plan Note (Signed)
Patient with chronic bilateral knee pain likely secondary to OA and her morbid obesity. She takes Flexeril and Gabapentin for her pain. Her gabapentin was increased on last visit. She also takes OTC tylenol twice a day for her pain. She was prescribed voltaren gel, but has not taken this due to cost. She follows with orthopedics and reports receiving injections in the past.  A/P: Chronic knee pain likely 2/2 OA and morbid obesity. Patient given coupon for discounted Voltaren gel. -Encouraged weight loss -Continue Flexeril and Gabapentin -Voltaren gel -OTC Arthritis strength Tylenol

## 2016-01-03 NOTE — Progress Notes (Signed)
Internal Medicine Clinic Attending  Case discussed with Dr. Zada Finders at the time of the visit.  We reviewed the resident's history and exam and pertinent patient test results.  I agree with the assessment, diagnosis, and plan of care documented in the resident's note.  One correction: patient presented with chief complaint of knee pain.

## 2016-01-06 DIAGNOSIS — I739 Peripheral vascular disease, unspecified: Secondary | ICD-10-CM
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
## 2016-01-07 ENCOUNTER — Ambulatory Visit (HOSPITAL_COMMUNITY)
Admission: RE | Admit: 2016-01-07 | Discharge: 2016-01-07 | Disposition: A | Discharge status: Home / Self Care | Payer: BLUE CROSS/BLUE SHIELD | Source: Ambulatory Visit | Attending: Cardiology | Admitting: Cardiology

## 2016-01-07 ENCOUNTER — Encounter (HOSPITAL_COMMUNITY)
Admission: RE | Disposition: A | Discharge status: Home / Self Care | Payer: Self-pay | Source: Ambulatory Visit | Attending: Cardiology

## 2016-01-07 DIAGNOSIS — R739 Hyperglycemia, unspecified: Secondary | ICD-10-CM | POA: Diagnosis not present

## 2016-01-07 DIAGNOSIS — R0989 Other specified symptoms and signs involving the circulatory and respiratory systems: Secondary | ICD-10-CM | POA: Diagnosis not present

## 2016-01-07 DIAGNOSIS — Z7982 Long term (current) use of aspirin: Secondary | ICD-10-CM | POA: Diagnosis not present

## 2016-01-07 DIAGNOSIS — G8929 Other chronic pain: Secondary | ICD-10-CM | POA: Insufficient documentation

## 2016-01-07 DIAGNOSIS — Z6841 Body Mass Index (BMI) 40.0 and over, adult: Secondary | ICD-10-CM | POA: Insufficient documentation

## 2016-01-07 DIAGNOSIS — I739 Peripheral vascular disease, unspecified: Secondary | ICD-10-CM

## 2016-01-07 DIAGNOSIS — I70211 Atherosclerosis of native arteries of extremities with intermittent claudication, right leg: Secondary | ICD-10-CM | POA: Diagnosis present

## 2016-01-07 DIAGNOSIS — G629 Polyneuropathy, unspecified: Secondary | ICD-10-CM | POA: Diagnosis not present

## 2016-01-07 DIAGNOSIS — E785 Hyperlipidemia, unspecified: Secondary | ICD-10-CM | POA: Insufficient documentation

## 2016-01-07 DIAGNOSIS — R0789 Other chest pain: Secondary | ICD-10-CM | POA: Insufficient documentation

## 2016-01-07 DIAGNOSIS — Z8249 Family history of ischemic heart disease and other diseases of the circulatory system: Secondary | ICD-10-CM | POA: Diagnosis not present

## 2016-01-07 DIAGNOSIS — I10 Essential (primary) hypertension: Secondary | ICD-10-CM | POA: Insufficient documentation

## 2016-01-07 DIAGNOSIS — K219 Gastro-esophageal reflux disease without esophagitis: Secondary | ICD-10-CM | POA: Insufficient documentation

## 2016-01-07 HISTORY — PX: PERIPHERAL VASCULAR CATHETERIZATION: SHX172C

## 2016-01-07 LAB — PREGNANCY, URINE: Preg Test, Ur: NEGATIVE

## 2016-01-07 SURGERY — LOWER EXTREMITY ANGIOGRAPHY
Anesthesia: LOCAL

## 2016-01-07 MED ORDER — SODIUM CHLORIDE 0.9 % IV SOLN: INTRAVENOUS | Status: DC

## 2016-01-07 MED ORDER — SODIUM CHLORIDE 0.9 % IV SOLN: 250.0000 mL | INTRAVENOUS | Status: DC | PRN

## 2016-01-07 MED ORDER — IODIXANOL 320 MG/ML IV SOLN
INTRAVENOUS | Status: DC | PRN
  Administered 2016-01-07: 110 mL via INTRA_ARTERIAL

## 2016-01-07 MED ORDER — HEPARIN (PORCINE) IN NACL 2-0.9 UNIT/ML-% IJ SOLN
INTRAMUSCULAR | Status: DC | PRN
  Administered 2016-01-07: 1000 mL

## 2016-01-07 MED ORDER — MIDAZOLAM HCL 2 MG/2ML IJ SOLN
INTRAMUSCULAR | Status: DC | PRN
  Administered 2016-01-07: 1 mg via INTRAVENOUS

## 2016-01-07 MED ORDER — LIDOCAINE HCL (PF) 1 % IJ SOLN
INTRAMUSCULAR | Status: AC
  Filled 2016-01-07: qty 30

## 2016-01-07 MED ORDER — MIDAZOLAM HCL 2 MG/2ML IJ SOLN
INTRAMUSCULAR | Status: AC
  Filled 2016-01-07: qty 2

## 2016-01-07 MED ORDER — SODIUM CHLORIDE 0.9% FLUSH: 3.0000 mL | INTRAVENOUS | Status: DC | PRN

## 2016-01-07 MED ORDER — HYDROMORPHONE HCL 1 MG/ML IJ SOLN
0.5000 mg | Freq: Once | INTRAMUSCULAR | Status: AC
  Administered 2016-01-07: 0.5 mg via INTRAVENOUS

## 2016-01-07 MED ORDER — SODIUM CHLORIDE 0.9 % IV BOLUS (SEPSIS)
500.0000 mL | Freq: Once | INTRAVENOUS | Status: AC
  Administered 2016-01-07: 500 mL via INTRAVENOUS

## 2016-01-07 MED ORDER — HYDROMORPHONE HCL 1 MG/ML IJ SOLN
INTRAMUSCULAR | Status: DC | PRN
  Administered 2016-01-07: 0.5 mg via INTRAVENOUS

## 2016-01-07 MED ORDER — HEPARIN (PORCINE) IN NACL 2-0.9 UNIT/ML-% IJ SOLN
INTRAMUSCULAR | Status: AC
  Filled 2016-01-07: qty 1000

## 2016-01-07 MED ORDER — ASPIRIN 81 MG PO CHEW
CHEWABLE_TABLET | ORAL | Status: DC | PRN
  Administered 2016-01-07: 81 mg via ORAL

## 2016-01-07 MED ORDER — LIDOCAINE HCL (PF) 1 % IJ SOLN
INTRAMUSCULAR | Status: DC | PRN
  Administered 2016-01-07: 20 mL

## 2016-01-07 MED ORDER — HYDROMORPHONE HCL 1 MG/ML IJ SOLN
INTRAMUSCULAR | Status: AC
  Filled 2016-01-07: qty 1

## 2016-01-07 MED ORDER — ASPIRIN 81 MG PO CHEW
CHEWABLE_TABLET | ORAL | Status: AC
  Filled 2016-01-07: qty 1

## 2016-01-07 MED ORDER — SODIUM CHLORIDE 0.9% FLUSH: 3.0000 mL | Freq: Two times a day (BID) | INTRAVENOUS | Status: DC

## 2016-01-07 SURGICAL SUPPLY — 12 items
CATH OMNI FLUSH 5F 65CM (CATHETERS) ×2 IMPLANT
DEVICE CLOSURE PERCLS PRGLD 6F (VASCULAR PRODUCTS) ×1 IMPLANT
KIT MICROINTRODUCER STIFF 5F (SHEATH) ×2 IMPLANT
KIT PV (KITS) ×2 IMPLANT
PERCLOSE PROGLIDE 6F (VASCULAR PRODUCTS) ×2
SHEATH PINNACLE 5F 10CM (SHEATH) ×2 IMPLANT
STOPCOCK MORSE 400PSI 3WAY (MISCELLANEOUS) ×2 IMPLANT
SYRINGE MEDRAD AVANTA MACH 7 (SYRINGE) ×2 IMPLANT
TRANSDUCER W/STOPCOCK (MISCELLANEOUS) ×2 IMPLANT
TRAY PV CATH (CUSTOM PROCEDURE TRAY) ×2 IMPLANT
TUBING CIL FLEX 10 FLL-RA (TUBING) ×2 IMPLANT
WIRE HITORQ VERSACORE ST 145CM (WIRE) ×2 IMPLANT

## 2016-01-07 NOTE — Interval H&P Note (Signed)
History and Physical Interval Note:  01/07/2016 12:24 PM  Elizabeth Diaz  has presented today for surgery, with the diagnosis of oad  The various methods of treatment have been discussed with the patient and family. After consideration of risks, benefits and other options for treatment, the patient has consented to  Procedure(s): Lower Extremity Angiography (N/A) and possible PTA as a surgical intervention .  The patient's history has been reviewed, patient examined, no change in status, stable for surgery.  I have reviewed the patient's chart and labs.  Questions were answered to the patient's satisfaction.     Adrian Prows

## 2016-01-07 NOTE — Discharge Instructions (Signed)
Angiogram, Care After °Refer to this sheet in the next few weeks. These instructions provide you with information about caring for yourself after your procedure. Your health care provider may also give you more specific instructions. Your treatment has been planned according to current medical practices, but problems sometimes occur. Call your health care provider if you have any problems or questions after your procedure. °WHAT TO EXPECT AFTER THE PROCEDURE °After your procedure, it is typical to have the following: °· Bruising at the catheter insertion site that usually fades within 1-2 weeks. °· Blood collecting in the tissue (hematoma) that may be painful to the touch. It should usually decrease in size and tenderness within 1-2 weeks. °HOME CARE INSTRUCTIONS °· Take medicines only as directed by your health care provider. °· You may shower 24-48 hours after the procedure or as directed by your health care provider. Remove the bandage (dressing) and gently wash the site with plain soap and water. Pat the area dry with a clean towel. Do not rub the site, because this may cause bleeding. °· Do not take baths, swim, or use a hot tub until your health care provider approves. °· Check your insertion site every day for redness, swelling, or drainage. °· Do not apply powder or lotion to the site. °· Do not lift over 10 lb (4.5 kg) for 5 days after your procedure or as directed by your health care provider. °· Ask your health care provider when it is okay to: °¨ Return to work or school. °¨ Resume usual physical activities or sports. °¨ Resume sexual activity. °· Do not drive home if you are discharged the same day as the procedure. Have someone else drive you. °· You may drive 24 hours after the procedure unless otherwise instructed by your health care provider. °· Do not operate machinery or power tools for 24 hours after the procedure or as directed by your health care provider. °· If your procedure was done as an  outpatient procedure, which means that you went home the same day as your procedure, a responsible adult should be with you for the first 24 hours after you arrive home. °· Keep all follow-up visits as directed by your health care provider. This is important. °SEEK MEDICAL CARE IF: °· You have a fever. °· You have chills. °· You have increased bleeding from the catheter insertion site. Hold pressure on the site and call 911. °SEEK IMMEDIATE MEDICAL CARE IF: °· You have unusual pain at the catheter insertion site. °· You have redness, warmth, or swelling at the catheter insertion site. °· You have drainage (other than a small amount of blood on the dressing) from the catheter insertion site. °· The catheter insertion site is bleeding, and the bleeding does not stop after 30 minutes of holding steady pressure on the site. °· The area near or just beyond the catheter insertion site becomes pale, cool, tingly, or numb. °  °This information is not intended to replace advice given to you by your health care provider. Make sure you discuss any questions you have with your health care provider. °  °Document Released: 04/02/2005 Document Revised: 10/05/2014 Document Reviewed: 02/15/2013 °Elsevier Interactive Patient Education ©2016 [employer] ° °

## 2016-01-08 ENCOUNTER — Encounter (HOSPITAL_COMMUNITY): Payer: Self-pay | Admitting: Cardiology

## 2016-01-08 ENCOUNTER — Telehealth: Payer: Self-pay | Admitting: Internal Medicine

## 2016-01-08 ENCOUNTER — Telehealth: Payer: Self-pay

## 2016-01-08 NOTE — Telephone Encounter (Signed)
Agreed, I discussed this with her on last visit as well. Thanks for the update.

## 2016-01-08 NOTE — Telephone Encounter (Signed)
Pt called requesting a letter to be out of work another week- most recent letter given says she can return 4/15.  She had a LE angigram yesterday with Dr. Nadyne Coombes and says she was told not to do any lifting etc so would like an extension on her work note.  I explained to her that she should get this note from Dr. Nadyne Coombes or from the doctor that has been writing them most frequently over the last 6 months.   FYI

## 2016-01-08 NOTE — Telephone Encounter (Signed)
PT CALLED [facility] AS INSTRUCTED AND THEY RELEASE HER TO [facility] 3/17, PLEASE CALL PATIENT BACK

## 2016-01-09 ENCOUNTER — Encounter (HOSPITAL_COMMUNITY): Payer: Self-pay | Admitting: Emergency Medicine

## 2016-01-09 ENCOUNTER — Emergency Department (HOSPITAL_COMMUNITY): Payer: BLUE CROSS/BLUE SHIELD

## 2016-01-09 DIAGNOSIS — Z9889 Other specified postprocedural states: Secondary | ICD-10-CM | POA: Diagnosis not present

## 2016-01-09 DIAGNOSIS — Z86010 Personal history of colonic polyps: Secondary | ICD-10-CM | POA: Diagnosis not present

## 2016-01-09 DIAGNOSIS — Q394 Esophageal web: Secondary | ICD-10-CM | POA: Insufficient documentation

## 2016-01-09 DIAGNOSIS — Z79899 Other long term (current) drug therapy: Secondary | ICD-10-CM | POA: Diagnosis not present

## 2016-01-09 DIAGNOSIS — Z87891 Personal history of nicotine dependence: Secondary | ICD-10-CM | POA: Diagnosis not present

## 2016-01-09 DIAGNOSIS — R0602 Shortness of breath: Secondary | ICD-10-CM | POA: Diagnosis not present

## 2016-01-09 DIAGNOSIS — M1990 Unspecified osteoarthritis, unspecified site: Secondary | ICD-10-CM | POA: Insufficient documentation

## 2016-01-09 DIAGNOSIS — R0789 Other chest pain: Secondary | ICD-10-CM | POA: Diagnosis not present

## 2016-01-09 DIAGNOSIS — R079 Chest pain, unspecified: Secondary | ICD-10-CM | POA: Diagnosis present

## 2016-01-09 DIAGNOSIS — Z8719 Personal history of other diseases of the digestive system: Secondary | ICD-10-CM | POA: Diagnosis not present

## 2016-01-09 DIAGNOSIS — R42 Dizziness and giddiness: Secondary | ICD-10-CM | POA: Insufficient documentation

## 2016-01-09 DIAGNOSIS — E669 Obesity, unspecified: Secondary | ICD-10-CM | POA: Diagnosis not present

## 2016-01-09 DIAGNOSIS — Z8709 Personal history of other diseases of the respiratory system: Secondary | ICD-10-CM | POA: Diagnosis not present

## 2016-01-09 DIAGNOSIS — Z7982 Long term (current) use of aspirin: Secondary | ICD-10-CM | POA: Insufficient documentation

## 2016-01-09 LAB — BASIC METABOLIC PANEL
Anion gap: 12 (ref 5–15)
BUN: 6 mg/dL (ref 6–20)
CHLORIDE: 105 mmol/L (ref 101–111)
CO2: 22 mmol/L (ref 22–32)
Calcium: 9 mg/dL (ref 8.9–10.3)
Creatinine, Ser: 0.64 mg/dL (ref 0.44–1.00)
GFR calc Af Amer: >60 mL/min (ref >60)
GFR calc non Af Amer: >60 mL/min (ref >60)
Glucose, Bld: 131 mg/dL — ABNORMAL HIGH (ref 65–99)
POTASSIUM: 3.6 mmol/L (ref 3.5–5.1)
SODIUM: 139 mmol/L (ref 135–145)

## 2016-01-09 LAB — CBC
HEMATOCRIT: 37.3 % (ref 36.0–46.0)
Hemoglobin: 12.3 g/dL (ref 12.0–15.0)
MCH: 30.3 pg (ref 26.0–34.0)
MCHC: 33 g/dL (ref 30.0–36.0)
MCV: 91.9 fL (ref 78.0–100.0)
PLATELETS: 298 10*3/uL (ref 150–400)
RBC: 4.06 MIL/uL (ref 3.87–5.11)
RDW: 12.7 % (ref 11.5–15.5)
WBC: 10.5 10*3/uL (ref 4.0–10.5)

## 2016-01-09 LAB — I-STAT TROPONIN, ED: TROPONIN I, POC: 0 ng/mL (ref 0.00–0.08)

## 2016-01-09 NOTE — ED Notes (Signed)
Pt. reports intermittent central/left chest pain with SOB onset 2 weeks ago , denies emesis or diaphoresis .

## 2016-01-10 ENCOUNTER — Emergency Department (HOSPITAL_COMMUNITY)
Admission: EM | Admit: 2016-01-10 | Discharge: 2016-01-10 | Disposition: A | Discharge status: Home / Self Care | Payer: BLUE CROSS/BLUE SHIELD | Attending: Emergency Medicine | Admitting: Emergency Medicine

## 2016-01-10 ENCOUNTER — Encounter (HOSPITAL_COMMUNITY): Payer: Self-pay | Admitting: Emergency Medicine

## 2016-01-10 DIAGNOSIS — R0789 Other chest pain: Secondary | ICD-10-CM

## 2016-01-10 LAB — I-STAT TROPONIN, ED: Troponin i, poc: 0 ng/mL (ref 0.00–0.08)

## 2016-01-10 LAB — D-DIMER, QUANTITATIVE: D-Dimer, Quant: 0.49 ug/mL-FEU (ref 0.00–0.50)

## 2016-01-10 NOTE — ED Notes (Signed)
MD at bedside. 

## 2016-01-10 NOTE — ED Provider Notes (Addendum)
CSN: [id number]     Arrival date & time 01/09/16  2215 History  By signing my name below, I, Rowan Blase, attest that this documentation has been prepared under the direction and in the presence of Pia Jedlicka, MD . Electronically Signed: Rowan Blase, Scribe. 01/10/2016. 2:42 AM.    Chief Complaint  Patient presents with  . Chest Pain   Patient is a 50 y.o. female presenting with shortness of breath. The history is provided by the patient. No language interpreter was used.  Shortness of Breath Severity:  Moderate Onset quality:  Gradual Timing:  Constant Progression:  Unchanged Chronicity:  Chronic Context: not smoke exposure   Relieved by:  Nothing Worsened by:  Nothing tried Ineffective treatments:  None tried Associated symptoms: chest pain   Associated symptoms: no hemoptysis, no sputum production and no syncope   Risk factors: no recent alcohol use    HPI Comments:  ALAXANDRA FRIELING is a 50 y.o. female with PMHx of seizures and obesity who presents to the Emergency Department complaining of intermittent, central chest pain onset 2 weeks ago. Pt reports associated worsening shortness of breath. She also notes dizziness while driving and while at her cardiologists office. Cardiologist Dr. Nadyne Coombes told pt dizziness might be due to a blockage in her thigh; cardiologist told pt her heart was fine. No alleviating or exacerbating factors noted.  Past Medical History  Diagnosis Date  . Pneumothorax   . Arthritis   . Numbness     left leg  . Hyperplastic colon polyp   . Diverticulosis   . Obesity   . Seizures (HCC)     hx of, no seizures last few years  . Schatzki's ring    Past Surgical History  Procedure Laterality Date  . Chest tube insertion    . Tubal ligation    . Colonoscopy with propofol N/A 11/01/2014    Procedure: COLONOSCOPY WITH PROPOFOL;  Surgeon: Jerene Bears, MD;  Location: [facility];  Service: Gastroenterology;  Laterality: N/A;  .  Esophagogastroduodenoscopy (egd) with propofol N/A 07/29/2015    Procedure: ESOPHAGOGASTRODUODENOSCOPY (EGD) WITH PROPOFOL;  Surgeon: Jerene Bears, MD;  Location: [facility];  Service: Gastroenterology;  Laterality: N/A;  . Peripheral vascular catheterization N/A 01/07/2016    Procedure: Lower Extremity Angiography;  Surgeon: Adrian Prows, MD;  Location: [facility];  Service: Cardiovascular;  Laterality: N/A;   Family History  Problem Relation Age of Onset  . Heart failure Father   . Diabetes Maternal Grandmother   . Diabetes Paternal Grandfather   . Diabetes Father   . Cancer Maternal Grandfather     unknown type   Social History  Substance Use Topics  . Smoking status: Former Smoker -- 1.50 packs/day for 0 years    Types: Cigarettes    Quit date: 05/04/2009  . Smokeless tobacco: Never Used  . Alcohol Use: Yes   OB History    No data available     Review of Systems  Respiratory: Positive for shortness of breath. Negative for hemoptysis and sputum production.   Cardiovascular: Positive for chest pain. Negative for syncope.  Neurological: Positive for dizziness.  All other systems reviewed and are negative.   Allergies  Review of patient's allergies indicates no known allergies.  Home Medications   Prior to Admission medications   Medication Sig Start Date End Date Taking? Authorizing Provider  amitriptyline (ELAVIL) 75 MG tablet Take 75 mg by mouth at bedtime.    Historical Provider, MD  aspirin 81 MG chewable tablet Chew 81 mg by mouth daily.    Historical Provider, MD  cyclobenzaprine (FLEXERIL) 10 MG tablet Take 0.5-1 tablets (5-10 mg total) by mouth 3 (three) times daily as needed for muscle spasms. 12/12/15   Iline Oven, MD  Eluxadoline (VIBERZI) 100 MG TABS Take 1 tablet by mouth 2 (two) times daily. 10/02/15   Jerene Bears, MD  furosemide (LASIX) 40 MG tablet Take 1 tablet (40 mg total) by mouth daily as needed for edema. 12/12/15   Iline Oven, MD   gabapentin (NEURONTIN) 300 MG capsule Take 600 mg (2 tabs) every morning and afternoon.  Take 900 mg (3 tabs) every evening. Patient taking differently: Take 600-900 mg by mouth 3 (three) times daily. 600mg  in the morning, 600mg  in the afternoon.  900mg  in the evening. 12/12/15   Iline Oven, MD  pantoprazole (PROTONIX) 20 MG tablet Take 20 mg by mouth daily.    Historical Provider, MD   BP 134/88 mmHg  Pulse 97  Temp(Src) 98.6 F (37 C) (Oral)  Resp 16  SpO2 100%  LMP 01/07/2016 Physical Exam  Constitutional: She is oriented to person, place, and time. She appears well-developed and well-nourished.  HENT:  Head: Normocephalic and atraumatic.  Mouth/Throat: Oropharynx is clear and moist.  Eyes: Pupils are equal, round, and reactive to light.  Neck: Normal range of motion. Neck supple. Carotid bruit is not present.  Cardiovascular: Normal rate and regular rhythm.   Pulmonary/Chest: Effort normal and breath sounds normal. No stridor. No respiratory distress. She has no wheezes. She has no rales.  Abdominal: Soft. Bowel sounds are normal. There is no tenderness. There is no rebound and no guarding.  Musculoskeletal: Normal range of motion. She exhibits no edema or tenderness.  Neurological: She is alert and oriented to person, place, and time. She has normal reflexes.  Skin: Skin is warm and dry. She is not diaphoretic.  Psychiatric: She has a normal mood and affect.   ED Course  Procedures  DIAGNOSTIC STUDIES:  Oxygen Saturation is 100% on RA, normal by my interpretation.    COORDINATION OF CARE:  2:27 AM Discussed treatment plan with pt at bedside and pt agreed to plan.   Labs Review Labs Reviewed  BASIC METABOLIC PANEL - Abnormal; Notable for the following:    Glucose, Bld 131 (*)    All other components within normal limits  CBC  D-DIMER, QUANTITATIVE (NOT AT [facility])  I-STAT TROPOININ, ED  Randolm Idol, ED    Imaging Review Dg Chest 2 View  01/09/2016   CLINICAL DATA:  Chest pain with shortness of breath for 2 weeks EXAM: CHEST  2 VIEW COMPARISON:  Chest radiograph and chest CT December 04, 2015 FINDINGS: There is scarring in both lower lung zones. There is no edema or consolidation. Heart size and pulmonary vascularity are normal. No adenopathy. No pneumothorax. No bone lesions. IMPRESSION: Stable lower lung zones scarring bilaterally. No edema or consolidation. No change in cardiac silhouette. Electronically Signed   By: Lowella Grip III M.D.   On: 01/09/2016 22:49   I have personally reviewed and evaluated these images and lab results as part of my medical decision-making.   EKG Interpretation   Date/Time:  Thursday Gita Dilger 13 2017 22:25:04 EDT Ventricular Rate:  104 PR Interval:  128 QRS Duration: 72 QT Interval:  342 QTC Calculation: 449 R Axis:   47 Text Interpretation:  Sinus tachycardia Confirmed by [facility]  MD,  Anwita Mencer ([id number])  on 01/10/2016 2:06:27 AM     Results for orders placed or performed during the hospital encounter of Q000111Q  Basic metabolic panel  Result Value Ref Range   Sodium 139 135 - 145 mmol/L   Potassium 3.6 3.5 - 5.1 mmol/L   Chloride 105 101 - 111 mmol/L   CO2 22 22 - 32 mmol/L   Glucose, Bld 131 (H) 65 - 99 mg/dL   BUN 6 6 - 20 mg/dL   Creatinine, Ser 0.64 0.44 - 1.00 mg/dL   Calcium 9.0 8.9 - 10.3 mg/dL   GFR calc non Af Amer >60 >60 mL/min   GFR calc Af Amer >60 >60 mL/min   Anion gap 12 5 - 15  CBC  Result Value Ref Range   WBC 10.5 4.0 - 10.5 K/uL   RBC 4.06 3.87 - 5.11 MIL/uL   Hemoglobin 12.3 12.0 - 15.0 g/dL   HCT 37.3 36.0 - 46.0 %   MCV 91.9 78.0 - 100.0 fL   MCH 30.3 26.0 - 34.0 pg   MCHC 33.0 30.0 - 36.0 g/dL   RDW 12.7 11.5 - 15.5 %   Platelets 298 150 - 400 K/uL  D-dimer, quantitative (not at [facility])  Result Value Ref Range   D-Dimer, Quant 0.49 0.00 - 0.50 ug/mL-FEU  I-stat troponin, ED  Result Value Ref Range   Troponin i, poc 0.00 0.00 - 0.08 ng/mL   Comment 3           I-stat troponin, ED  Result Value Ref Range   Troponin i, poc 0.00 0.00 - 0.08 ng/mL   Comment 3           Dg Chest 2 View  01/09/2016  CLINICAL DATA:  Chest pain with shortness of breath for 2 weeks EXAM: CHEST  2 VIEW COMPARISON:  Chest radiograph and chest CT December 04, 2015 FINDINGS: There is scarring in both lower lung zones. There is no edema or consolidation. Heart size and pulmonary vascularity are normal. No adenopathy. No pneumothorax. No bone lesions. IMPRESSION: Stable lower lung zones scarring bilaterally. No edema or consolidation. No change in cardiac silhouette. Electronically Signed   By: Lowella Grip III M.D.   On: 01/09/2016 22:49     MDM   Recommended follow-up with PCD within 48 hours. Advised pt to follow-up with pulmonologist for pulmonary function testing and keep appointment for sleep study and CPAP testing in May. Strict return precautions given.  Final diagnoses:  None   Results for orders placed or performed during the hospital encounter of Q000111Q  Basic metabolic panel  Result Value Ref Range   Sodium 139 135 - 145 mmol/L   Potassium 3.6 3.5 - 5.1 mmol/L   Chloride 105 101 - 111 mmol/L   CO2 22 22 - 32 mmol/L   Glucose, Bld 131 (H) 65 - 99 mg/dL   BUN 6 6 - 20 mg/dL   Creatinine, Ser 0.64 0.44 - 1.00 mg/dL   Calcium 9.0 8.9 - 10.3 mg/dL   GFR calc non Af Amer >60 >60 mL/min   GFR calc Af Amer >60 >60 mL/min   Anion gap 12 5 - 15  CBC  Result Value Ref Range   WBC 10.5 4.0 - 10.5 K/uL   RBC 4.06 3.87 - 5.11 MIL/uL   Hemoglobin 12.3 12.0 - 15.0 g/dL   HCT 37.3 36.0 - 46.0 %   MCV 91.9 78.0 - 100.0 fL   MCH 30.3 26.0 - 34.0 pg   MCHC  33.0 30.0 - 36.0 g/dL   RDW 12.7 11.5 - 15.5 %   Platelets 298 150 - 400 K/uL  D-dimer, quantitative (not at [facility])  Result Value Ref Range   D-Dimer, Quant 0.49 0.00 - 0.50 ug/mL-FEU  I-stat troponin, ED  Result Value Ref Range   Troponin i, poc 0.00 0.00 - 0.08 ng/mL   Comment 3          I-stat troponin,  ED  Result Value Ref Range   Troponin i, poc 0.00 0.00 - 0.08 ng/mL   Comment 3           Dg Chest 2 View  01/09/2016  CLINICAL DATA:  Chest pain with shortness of breath for 2 weeks EXAM: CHEST  2 VIEW COMPARISON:  Chest radiograph and chest CT December 04, 2015 FINDINGS: There is scarring in both lower lung zones. There is no edema or consolidation. Heart size and pulmonary vascularity are normal. No adenopathy. No pneumothorax. No bone lesions. IMPRESSION: Stable lower lung zones scarring bilaterally. No edema or consolidation. No change in cardiac silhouette. Electronically Signed   By: Lowella Grip III M.D.   On: 01/09/2016 22:49    Filed Vitals:   01/09/16 2225 01/10/16 0150  BP: 130/79 134/88  Pulse: 104 97  Temp: 98.9 F (37.2 C) 98.6 F (37 C)  Resp: 18 16     PERC negative wells 0 highly doubt PE and has negative DDimer.  Ruled out for ACS with negative EKG and 2 negative troponins suspect restrictive lung issue due to body habitus.  Will refer to pulmonology and PMD for recheck in 48 hours and PFTs.  Strict return precautions given.    I personally performed the services described in this documentation, which was scribed in my presence. The recorded information has been reviewed and is accurate.      Veatrice Kells, MD 01/10/16 [id number]  Veatrice Kells, MD 01/10/16 [phone]

## 2016-01-13 ENCOUNTER — Ambulatory Visit (INDEPENDENT_AMBULATORY_CARE_PROVIDER_SITE_OTHER): Payer: BLUE CROSS/BLUE SHIELD | Admitting: Internal Medicine

## 2016-01-13 ENCOUNTER — Encounter: Payer: Self-pay | Admitting: Internal Medicine

## 2016-01-13 ENCOUNTER — Telehealth: Payer: Self-pay | Admitting: Internal Medicine

## 2016-01-13 VITALS — BP 109/64 | HR 103 | Temp 98.4°F | Wt 283.9 lb

## 2016-01-13 DIAGNOSIS — M25562 Pain in left knee: Secondary | ICD-10-CM

## 2016-01-13 DIAGNOSIS — M25561 Pain in right knee: Secondary | ICD-10-CM | POA: Diagnosis not present

## 2016-01-13 DIAGNOSIS — Z6841 Body Mass Index (BMI) 40.0 and over, adult: Secondary | ICD-10-CM

## 2016-01-13 DIAGNOSIS — G8929 Other chronic pain: Secondary | ICD-10-CM

## 2016-01-13 NOTE — Assessment & Plan Note (Signed)
Pt is morbidly obese.  She reports having the information for bariatric surgery and went to the class about 2-3 weeks ago.  She reports also speaking with Butch Penny Plyler regarding her weight. -encouraged weight loss -hopefully she will be able to get bariatric surgery

## 2016-01-13 NOTE — Progress Notes (Signed)
Patient ID: MARLETH MCKENDALL, female   DOB: 1966-05-25, 50 y.o.   MRN: [id number]     Subjective:   Patient ID: IESHA ALTHERR female    DOB: 13-Oct-1965 50 y.o.    MRN: [id number] Health Maintenance Due: Health Maintenance Due  Topic Date Due  . PNEUMOCOCCAL POLYSACCHARIDE VACCINE (1) 12/27/1967  . OPHTHALMOLOGY EXAM  12/27/1975  . URINE MICROALBUMIN  12/27/1975    _________________________________________________  HPI: Ms.Willeen D Curti is a 50 y.o. female here for chronic knee pain.  Pt has a PMH outlined below.  Please see problem-based charting assessment and plan for further status of patient's chronic medical problems addressed at today's visit.  PMH: Past Medical History  Diagnosis Date  . Pneumothorax   . Arthritis   . Numbness     left leg  . Hyperplastic colon polyp   . Diverticulosis   . Obesity   . Seizures (HCC)     hx of, no seizures last few years  . Schatzki's ring     Medications: Current Outpatient Prescriptions on File Prior to Visit  Medication Sig Dispense Refill  . amitriptyline (ELAVIL) 75 MG tablet Take 75 mg by mouth at bedtime.    Marland Kitchen aspirin 81 MG chewable tablet Chew 81 mg by mouth daily.    . cyclobenzaprine (FLEXERIL) 10 MG tablet Take 0.5-1 tablets (5-10 mg total) by mouth 3 (three) times daily as needed for muscle spasms. 60 tablet 0  . Eluxadoline (VIBERZI) 100 MG TABS Take 1 tablet by mouth 2 (two) times daily. 60 tablet 3  . furosemide (LASIX) 40 MG tablet Take 1 tablet (40 mg total) by mouth daily as needed for edema. 30 tablet 3  . gabapentin (NEURONTIN) 300 MG capsule Take 600 mg (2 tabs) every morning and afternoon.  Take 900 mg (3 tabs) every evening. (Patient taking differently: Take 600-900 mg by mouth 3 (three) times daily. 600mg  in the morning, 600mg  in the afternoon.  900mg  in the evening.) 120 capsule 3  . pantoprazole (PROTONIX) 20 MG tablet Take 20 mg by mouth daily.     No current facility-administered medications on file  prior to visit.    Allergies: No Known Allergies  FH: Family History  Problem Relation Age of Onset  . Heart failure Father   . Diabetes Maternal Grandmother   . Diabetes Paternal Grandfather   . Diabetes Father   . Cancer Maternal Grandfather     unknown type    SH: Social History   Social History  . Marital Status: Single    Spouse Name: N/A  . Number of Children: 2  . Years of Education: N/A   Occupational History  . [employer]    Social History Main Topics  . Smoking status: Former Smoker -- 1.50 packs/day for 0 years    Types: Cigarettes    Quit date: 05/04/2009  . Smokeless tobacco: Never Used  . Alcohol Use: Yes  . 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 Haven't used any in two years.   . Sexual Activity:    Partners: Female   Other Topics Concern  . None   Social History Narrative   Lives in [place]    Works in [employer] group)   Hobbies: listening to music. Bowling.     Review of Systems: Constitutional: Negative for fever, chills.  Eyes: Negative for blurred vision.  Respiratory: Negative for cough and shortness of breath.  Cardiovascular: Negative for chest pain.  Gastrointestinal: Negative for nausea, vomiting. Musculoskeletal: +knee pain, +back pain.  Neurological: Negative for dizziness.   Objective:   Vital Signs: Filed Vitals:   01/13/16 1510  BP: 109/64  Pulse: 103  Temp: 98.4 F (36.9 C)  TempSrc: Oral  Weight: 283 lb 14.4 oz (128.776 kg)  SpO2: 98%      BP Readings from Last 3 Encounters:  01/13/16 109/64  01/10/16 111/56  01/07/16 139/83    Physical Exam: Constitutional: Vital signs reviewed.  Patient is in NAD and cooperative with exam.  Head: Normocephalic and atraumatic. Eyes: EOMI, conjunctivae nl, no scleral icterus.  Neck: Supple. Cardiovascular: RRR, no MRG. Pulmonary/Chest: Normal effort, CTAB. Abdominal: Obese.  Musculoskeletal: L knee FROM without  pain,swelling, or deformity.  Has some medial point tenderness.  R knee FROM without pain, swelling, or deformity.  No tenderness. No crepitus bilaterally. Neurological: A&O x3, cranial nerves II-XII are grossly intact, moving all extremities. Extremities: 1+ LE edema b/l. Skin: Warm, dry and intact.    Assessment & Plan:   Assessment and plan was discussed and formulated with my attending.

## 2016-01-13 NOTE — Telephone Encounter (Signed)
Pt tried calling back nurse on phone

## 2016-01-13 NOTE — Assessment & Plan Note (Addendum)
Pt was just here and saw her PCP, Dr. Posey Pronto on 4/5.  She returns today for the same complaints.  She has longstanding chronic bilateral knee pain likely secondary to OA and her morbid obesity. She takes flexeril and gabapentin for pain.  She also takes OTC tylenol bid.  She has been prescribed voltaren gel but has not taken due to cost. Chronic knee pain likely 2/2 OA and morbid obesity. Patient given coupon for discounted voltaren gel during last OV but was not able to afford it.  She had paperwork filled out for work on 11/27/2015.  She is requesting a work note today but I am unable to provide this as she has been out of work since July.  We discussed a long-term solution to her problem but she is in the process of applying for disability.  She has been to PT in the past.  Her orthopedist has given her steroid and hyaluronic acid injections last time November 2016.  She stated she did not obtain any relief from the injections.   -she has been to the bariatric weight loss class at [facility]   -encouraged weight loss -cont gabapentin and flexeril  -cont elavtil at night  -OTC arthritis strength tylenol -will refer to [facility] for other options

## 2016-01-13 NOTE — Patient Instructions (Signed)
Thank you for your visit today.   Please return to the internal medicine clinic as needed to discuss your weight and knee pain.     I will ask our social worker to contact you tomorrow.    Please be sure to bring all of your medications with you to every visit; this includes herbal supplements, vitamins, eye drops, and any over-the-counter medications.   Should you have any questions regarding your medications and/or any new or worsening symptoms, please be sure to call the clinic at [phone].   If you believe that you are suffering from a life threatening condition or one that may result in the loss of limb or function, then you should call 911 and proceed to the nearest Emergency Department.   A healthy lifestyle and preventative care can promote health and wellness.   Maintain regular health, dental, and eye exams.  Eat a healthy diet. Foods like vegetables, fruits, whole grains, low-fat dairy products, and lean protein foods contain the nutrients you need without too many calories. Decrease your intake of foods high in solid fats, added sugars, and salt. Get information about a proper diet from your caregiver, if necessary.  Regular physical exercise is one of the most important things you can do for your health. Most adults should get at least 150 minutes of moderate-intensity exercise (any activity that increases your heart rate and causes you to sweat) each week. In addition, most adults need muscle-strengthening exercises on 2 or more days a week.   Maintain a healthy weight. The body mass index (BMI) is a screening tool to identify possible weight problems. It provides an estimate of body fat based on height and weight. Your caregiver can help determine your BMI, and can help you achieve or maintain a healthy weight. For adults 20 years and older:  A BMI below 18.5 is considered underweight.  A BMI of 18.5 to 24.9 is normal.  A BMI of 25 to 29.9 is considered  overweight.  A BMI of 30 and above is considered obese.

## 2016-01-13 NOTE — Telephone Encounter (Signed)
Rtc, lm for rtc from pt

## 2016-01-13 NOTE — Telephone Encounter (Signed)
Needs to speak to nurse °

## 2016-01-13 NOTE — Telephone Encounter (Signed)
Pt calls and states she was told last week she could be seen today for her continued knee problem, appt given w/ dr gill, i advised that she call the doctors she has been seeing but she is adamant that she has been told that her pcp needs to do this

## 2016-01-14 ENCOUNTER — Ambulatory Visit (HOSPITAL_COMMUNITY)
Admission: EM | Admit: 2016-01-14 | Discharge: 2016-01-14 | Disposition: A | Discharge status: Home / Self Care | Payer: BLUE CROSS/BLUE SHIELD | Attending: Family Medicine | Admitting: Family Medicine

## 2016-01-14 ENCOUNTER — Telehealth: Payer: Self-pay | Admitting: Licensed Clinical Social Worker

## 2016-01-14 ENCOUNTER — Encounter (HOSPITAL_COMMUNITY): Payer: Self-pay | Admitting: *Deleted

## 2016-01-14 ENCOUNTER — Other Ambulatory Visit: Payer: Self-pay

## 2016-01-14 DIAGNOSIS — H109 Unspecified conjunctivitis: Secondary | ICD-10-CM

## 2016-01-14 DIAGNOSIS — Z1231 Encounter for screening mammogram for malignant neoplasm of breast: Secondary | ICD-10-CM

## 2016-01-14 MED ORDER — POLYMYXIN B-TRIMETHOPRIM 10000-0.1 UNIT/ML-% OP SOLN: 1.0000 [drp] | OPHTHALMIC | Status: DC

## 2016-01-14 NOTE — Progress Notes (Signed)
Case discussed with Dr. Gordy Levan soon after the resident saw the patient. We reviewed the resident's history and exam and pertinent patient test results. I agree with the assessment, diagnosis, and plan of care documented in the resident's note.  It looks like she has failed naprosyn in the past.  It is unclear if she has ever tried oral Voltaren.  If she fails OTC acetaminophen other NSAID classes she has yet to try can be trialed.  Inexpensive ones include ibuprofen and meloxicam (on $4 lists).  Salsalate and Celebrex can also be considered although cost may be an issue.  Ultimately weight loss will be paramount to long term control of her knee osteoarthritis.

## 2016-01-14 NOTE — Telephone Encounter (Signed)
[name] placed call to CSW this afternoon.  Pt states she previously received a Work Geneticist, molecular in March 2017 and is requesting a new letter.  CSW informed Ms. Brisk this request will be forwarded to pt's PCP for determination.  If letter is provided, pt is requesting to be notified to pick letter up personally.

## 2016-01-14 NOTE — ED Notes (Signed)
R    Eye   Is  Red   And  Irritated         With  Matting  Noted       When  She  Woke  Up  This  Am           She  Reports      That   The  Eye is draining           And somehat  Red

## 2016-01-14 NOTE — ED Provider Notes (Signed)
CSN: [id number]     Arrival date & time 01/14/16  1335 History   First MD Initiated Contact with Patient 01/14/16 1503     Chief Complaint  Patient presents with  . Conjunctivitis   (Consider location/radiation/quality/duration/timing/severity/associated sxs/prior Treatment) Patient is a 50 y.o. female presenting with conjunctivitis. The history is provided by the patient.  Conjunctivitis This is a new problem. The current episode started yesterday. The problem has been gradually worsening.    Past Medical History  Diagnosis Date  . Pneumothorax   . Arthritis   . Numbness     left leg  . Hyperplastic colon polyp   . Diverticulosis   . Obesity   . Seizures (HCC)     hx of, no seizures last few years  . Schatzki's ring    Past Surgical History  Procedure Laterality Date  . Chest tube insertion    . Tubal ligation    . Colonoscopy with propofol N/A 11/01/2014    Procedure: COLONOSCOPY WITH PROPOFOL;  Surgeon: Jerene Bears, MD;  Location: [facility];  Service: Gastroenterology;  Laterality: N/A;  . Esophagogastroduodenoscopy (egd) with propofol N/A 07/29/2015    Procedure: ESOPHAGOGASTRODUODENOSCOPY (EGD) WITH PROPOFOL;  Surgeon: Jerene Bears, MD;  Location: [facility];  Service: Gastroenterology;  Laterality: N/A;  . Peripheral vascular catheterization N/A 01/07/2016    Procedure: Lower Extremity Angiography;  Surgeon: Adrian Prows, MD;  Location: [facility];  Service: Cardiovascular;  Laterality: N/A;   Family History  Problem Relation Age of Onset  . Heart failure Father   . Diabetes Maternal Grandmother   . Diabetes Paternal Grandfather   . Diabetes Father   . Cancer Maternal Grandfather     unknown type   Social History  Substance Use Topics  . Smoking status: Former Smoker -- 1.50 packs/day for 0 years    Types: Cigarettes    Quit date: 05/04/2009  . Smokeless tobacco: Never Used  . Alcohol Use: Yes   OB History    No data available     Review of  Systems  Constitutional: Negative.   HENT: Negative.   Eyes: Positive for discharge and redness. Negative for photophobia, pain, itching and visual disturbance.  All other systems reviewed and are negative.   Allergies  Review of patient's allergies indicates no known allergies.  Home Medications   Prior to Admission medications   Medication Sig Start Date End Date Taking? Authorizing Provider  amitriptyline (ELAVIL) 75 MG tablet Take 75 mg by mouth at bedtime.    Historical Provider, MD  aspirin 81 MG chewable tablet Chew 81 mg by mouth daily.    Historical Provider, MD  Eluxadoline (VIBERZI) 100 MG TABS Take 1 tablet by mouth 2 (two) times daily. 10/02/15   Jerene Bears, MD  furosemide (LASIX) 40 MG tablet Take 1 tablet (40 mg total) by mouth daily as needed for edema. 12/12/15   Iline Oven, MD  gabapentin (NEURONTIN) 300 MG capsule Take 600 mg (2 tabs) every morning and afternoon.  Take 900 mg (3 tabs) every evening. Patient taking differently: Take 600-900 mg by mouth 3 (three) times daily. 600mg  in the morning, 600mg  in the afternoon.  900mg  in the evening. 12/12/15   Iline Oven, MD  methocarbamol (ROBAXIN) 500 MG tablet Take 3 tablets (1,500 mg total) by mouth 3 (three) times daily. As needed. 01/16/16   Roxanna Mew, PA-C  pantoprazole (PROTONIX) 20 MG tablet Take 20 mg by mouth daily.  Historical Provider, MD  trimethoprim-polymyxin b (POLYTRIM) ophthalmic solution Place 1 drop into the right eye every 4 (four) hours. 01/14/16   Billy Fischer, MD   Meds Ordered and Administered this Visit  Medications - No data to display  BP 126/66 mmHg  Pulse 76  Temp(Src) 98.8 F (37.1 C) (Oral)  Resp 16  SpO2 97%  LMP 01/07/2016 No data found.   Physical Exam  Constitutional: She is oriented to person, place, and time. She appears well-developed and well-nourished.  HENT:  Right Ear: External ear normal.  Left Ear: External ear normal.  Mouth/Throat: Oropharynx  is clear and moist.  Eyes: Conjunctivae and EOM are normal. Pupils are equal, round, and reactive to light.  Neck: Normal range of motion. Neck supple.  Lymphadenopathy:    She has no cervical adenopathy.  Neurological: She is alert and oriented to person, place, and time.  Skin: Skin is warm and dry.  Nursing note and vitals reviewed.   ED Course  Procedures (including critical care time)  Labs Review Labs Reviewed - No data to display  Imaging Review No results found.   Visual Acuity Review  Right Eye Distance:   Left Eye Distance:   Bilateral Distance:    Right Eye Near:   Left Eye Near:    Bilateral Near:         MDM   1. Conjunctivitis of right eye       Billy Fischer, MD 01/18/16 2036

## 2016-01-16 ENCOUNTER — Encounter (HOSPITAL_COMMUNITY): Payer: Self-pay | Admitting: Vascular Surgery

## 2016-01-16 ENCOUNTER — Emergency Department (HOSPITAL_COMMUNITY)
Admission: EM | Admit: 2016-01-16 | Discharge: 2016-01-16 | Disposition: A | Discharge status: Home / Self Care | Payer: BLUE CROSS/BLUE SHIELD | Attending: Emergency Medicine | Admitting: Emergency Medicine

## 2016-01-16 DIAGNOSIS — R197 Diarrhea, unspecified: Secondary | ICD-10-CM | POA: Diagnosis not present

## 2016-01-16 DIAGNOSIS — Z8719 Personal history of other diseases of the digestive system: Secondary | ICD-10-CM | POA: Insufficient documentation

## 2016-01-16 DIAGNOSIS — R531 Weakness: Secondary | ICD-10-CM | POA: Diagnosis not present

## 2016-01-16 DIAGNOSIS — Z7982 Long term (current) use of aspirin: Secondary | ICD-10-CM | POA: Diagnosis not present

## 2016-01-16 DIAGNOSIS — Z87891 Personal history of nicotine dependence: Secondary | ICD-10-CM | POA: Diagnosis not present

## 2016-01-16 DIAGNOSIS — M545 Low back pain: Secondary | ICD-10-CM | POA: Diagnosis not present

## 2016-01-16 DIAGNOSIS — M25562 Pain in left knee: Secondary | ICD-10-CM | POA: Diagnosis present

## 2016-01-16 DIAGNOSIS — G629 Polyneuropathy, unspecified: Secondary | ICD-10-CM | POA: Insufficient documentation

## 2016-01-16 DIAGNOSIS — E669 Obesity, unspecified: Secondary | ICD-10-CM | POA: Diagnosis not present

## 2016-01-16 DIAGNOSIS — G8929 Other chronic pain: Secondary | ICD-10-CM | POA: Diagnosis not present

## 2016-01-16 DIAGNOSIS — Z79899 Other long term (current) drug therapy: Secondary | ICD-10-CM | POA: Diagnosis not present

## 2016-01-16 DIAGNOSIS — Z792 Long term (current) use of antibiotics: Secondary | ICD-10-CM | POA: Insufficient documentation

## 2016-01-16 DIAGNOSIS — R600 Localized edema: Secondary | ICD-10-CM | POA: Insufficient documentation

## 2016-01-16 DIAGNOSIS — R200 Anesthesia of skin: Secondary | ICD-10-CM | POA: Insufficient documentation

## 2016-01-16 DIAGNOSIS — Z86010 Personal history of colonic polyps: Secondary | ICD-10-CM | POA: Insufficient documentation

## 2016-01-16 DIAGNOSIS — M1990 Unspecified osteoarthritis, unspecified site: Secondary | ICD-10-CM | POA: Diagnosis not present

## 2016-01-16 MED ORDER — DEXAMETHASONE SODIUM PHOSPHATE 10 MG/ML IJ SOLN
10.0000 mg | Freq: Once | INTRAMUSCULAR | Status: AC
  Administered 2016-01-16: 10 mg via INTRAMUSCULAR
  Filled 2016-01-16: qty 1

## 2016-01-16 MED ORDER — KETOROLAC TROMETHAMINE 60 MG/2ML IM SOLN
60.0000 mg | Freq: Once | INTRAMUSCULAR | Status: AC
  Administered 2016-01-16: 60 mg via INTRAMUSCULAR
  Filled 2016-01-16: qty 2

## 2016-01-16 MED ORDER — METHOCARBAMOL 500 MG PO TABS: 1500.0000 mg | ORAL_TABLET | Freq: Three times a day (TID) | ORAL | Status: DC

## 2016-01-16 NOTE — ED Notes (Addendum)
Pt reports to the ED for eval of low back pain and leg pain x 1 year. However, she reports her pain became much worse today. She states her prescribed Gabapentin and Flexeril is no longer working. Pt reports left leg numbness and tingling x 1 year. Also reports bowel and bladder incontinence at night since last year. Pt A&Ox4, resp e/u, and skin warm and dry. Denies any known injury. Ambulatory with cane

## 2016-01-16 NOTE — Telephone Encounter (Signed)
CSW received voice mail from Ms. Shinohara inquiring if response has been provided regarding her request for work excuse letter.

## 2016-01-16 NOTE — Discharge Instructions (Signed)
Follow up in the ED if symptoms worsen or develop a fever, unilateral leg swelling, shortness of breath, or chest pain. Below are resources to establish care at a pain clinic for improvement pain management. Be sure to follow up with your new PCP.   Community Resource Guide Chronic Pain [employer] is a great source of information about community services available.  Access by dialing 2-1-1 from anywhere in [place], or by website -  [employer].   Other Local Resources (Updated 09/2015)   Chronic Pain   Services     Phone Number and Address  [facility]  Pain management is offered by board-certified physician specialists, physical therapists, occupational therapists, and other professionals  Support groups [phone] [address], [place], [place],  [address]  [facility]  Various pain management strategies are offered, including medication, acupuncture, and manual therapy  Must be referred by primary care doctor [phone] [address] [place], [place]   [facility], [place]  Pain management clinic  Must be referred by primary care doctor [phone] [address] [place], [facility]  Pain management is offered by board-certified physician specialists [phone] [address] [place], [place] [address]   Chronic Pain Chronic pain can be defined as pain that is off and on and lasts for 3-6 months or longer. Many things cause chronic pain, which can make it difficult to make a diagnosis. There are many treatment options available for chronic pain. However, finding a treatment that works well for you may require trying various approaches until the right one is found. Many people benefit from a combination of two or more types of treatment to control  their pain. SYMPTOMS  Chronic pain can occur anywhere in the body and can range from mild to very severe. Some types of chronic pain include:  Headache.  Low back pain.  Cancer pain.  Arthritis pain.  Neurogenic pain. This is pain resulting from damage to nerves. People with chronic pain may also have other symptoms such as:  Depression.  Anger.  Insomnia.  Anxiety. DIAGNOSIS  Your health care provider will help diagnose your condition over time. In many cases, the initial focus will be on excluding possible conditions that could be causing the pain. Depending on your symptoms, your health care provider may order tests to diagnose your condition. Some of these tests may include:   Blood tests.   CT scan.   MRI.   X-rays.   Ultrasounds.   Nerve conduction studies.  You may need to see a specialist.  TREATMENT  Finding treatment that works well may take time. You may be referred to a pain specialist. He or she may prescribe medicine or therapies, such as:   Mindful meditation or yoga.  Shots (injections) of numbing or pain-relieving medicines into the spine or area of pain.  Local electrical stimulation.  Acupuncture.   Massage therapy.   Aroma, color, light, or sound therapy.   Biofeedback.   Working with a physical therapist to keep from getting stiff.   Regular, gentle exercise.   Cognitive or behavioral therapy.   Group support.  Sometimes, surgery may be recommended.  HOME CARE INSTRUCTIONS   Take all medicines as directed by your health care provider.   Lessen stress in your life by relaxing and doing things such as  listening to calming music.   Exercise or be active as directed by your health care provider.   Eat a healthy diet and include things such as vegetables, fruits, fish, and lean meats in your diet.   Keep all follow-up appointments with your health care provider.   Attend a support group with others suffering  from chronic pain. SEEK MEDICAL CARE IF:   Your pain gets worse.   You develop a new pain that was not there before.   You cannot tolerate medicines given to you by your health care provider.   You have new symptoms since your last visit with your health care provider.  SEEK IMMEDIATE MEDICAL CARE IF:   You feel weak.   You have decreased sensation or numbness.   You lose control of bowel or bladder function.   Your pain suddenly gets much worse.   You develop shaking.  You develop chills.  You develop confusion.  You develop chest pain.  You develop shortness of breath.  MAKE SURE YOU:  Understand these instructions.  Will watch your condition.  Will get help right away if you are not doing well or get worse.   This information is not intended to replace advice given to you by your health care provider. Make sure you discuss any questions you have with your health care provider.   Document Released: 06/06/2002 Document Revised: 05/17/2013 Document Reviewed: 03/10/2013 Elsevier Interactive Patient Education 2016 Elsevier Inc.  Edema Edema is an abnormal buildup of fluids. It is more common in your legs and thighs. Painless swelling of the feet and ankles is more likely as a person ages. It also is common in looser skin, like around your eyes. HOME CARE   Keep the affected body part above the level of the heart while lying down.  Do not sit still or stand for a long time.  Do not put anything right under your knees when you lie down.  Do not wear tight clothes on your upper legs.  Exercise your legs to help the puffiness (swelling) go down.  Wear elastic bandages or support stockings as told by your doctor.  A low-salt diet may help lessen the puffiness.  Only take medicine as told by your doctor. GET HELP IF:  Treatment is not working.  You have heart, liver, or kidney disease and notice that your skin looks puffy or shiny.  You have  puffiness in your legs that does not get better when you raise your legs.  You have sudden weight gain for no reason. GET HELP RIGHT AWAY IF:   You have shortness of breath or chest pain.  You cannot breathe when you lie down.  You have pain, redness, or warmth in the areas that are puffy.  You have heart, liver, or kidney disease and get edema all of a sudden.  You have a fever and your symptoms get worse all of a sudden. MAKE SURE YOU:   Understand these instructions.  Will watch your condition.  Will get help right away if you are not doing well or get worse.   This information is not intended to replace advice given to you by your health care provider. Make sure you discuss any questions you have with your health care provider.   Document Released: 03/02/2008 Document Revised: 09/19/2013 Document Reviewed: 07/07/2013 Elsevier Interactive Patient Education [employer].

## 2016-01-16 NOTE — Telephone Encounter (Signed)
CSW placed call to Ms. Elizabeth Diaz.  Pt notified no response as of this date from PCP.  [name] requesting letter indicating whether she can or can not return to work.  This worker informed [name] note will be forwarded to PCP.

## 2016-01-16 NOTE — ED Provider Notes (Signed)
CSN: [id number]     Arrival date & time 01/16/16  1641 History   First MD Initiated Contact with Patient 01/16/16 1936     Chief Complaint  Patient presents with  . Leg Pain  . Back Pain     (Consider location/radiation/quality/duration/timing/severity/associated sxs/prior Treatment) HPI Comments: Patient reports to ED with complaint of worsening pain in low back and knees b/l. Patient has a history of chronic pain in low back and knees secondary to arthritis. Reports that pain was worse today, 9/10, sharp in nature, and unrelieved with home medications. She is able to ambulate. 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 Denies any recent trauma.   Patient is a 50 y.o. female presenting with leg pain and back pain. The history is provided by the patient.  Leg Pain Location:  Knee Injury: no   Knee location:  L knee and R knee Pain details:    Timing:  Constant   Progression:  Worsening Associated symptoms: back pain   Associated symptoms: no fever   Back Pain Location:  Lumbar spine and sacro-iliac joint Radiates to:  R posterior upper leg Pain severity:  Severe Timing:  Constant Chronicity:  Chronic Associated symptoms: leg pain, numbness and weakness   Associated symptoms: no chest pain, no dysuria and no fever     Past Medical History  Diagnosis Date  . Pneumothorax   . Arthritis   . Numbness     left leg  . Hyperplastic colon polyp   . Diverticulosis   . Obesity   . Seizures (HCC)     hx of, no seizures last few years  . Schatzki's ring    Past Surgical History  Procedure Laterality Date  . Chest tube insertion    . Tubal ligation    . Colonoscopy with propofol N/A 11/01/2014    Procedure: COLONOSCOPY WITH PROPOFOL;  Surgeon: Jerene Bears, MD;  Location: [facility];  Service: Gastroenterology;  Laterality: N/A;  . Esophagogastroduodenoscopy (egd) with propofol N/A 07/29/2015     Procedure: ESOPHAGOGASTRODUODENOSCOPY (EGD) WITH PROPOFOL;  Surgeon: Jerene Bears, MD;  Location: [facility];  Service: Gastroenterology;  Laterality: N/A;  . Peripheral vascular catheterization N/A 01/07/2016    Procedure: Lower Extremity Angiography;  Surgeon: Adrian Prows, MD;  Location: [facility];  Service: Cardiovascular;  Laterality: N/A;   Family History  Problem Relation Age of Onset  . Heart failure Father   . Diabetes Maternal Grandmother   . Diabetes Paternal Grandfather   . Diabetes Father   . Cancer Maternal Grandfather     unknown type   Social History  Substance Use Topics  . Smoking status: Former Smoker -- 1.50 packs/day for 0 years    Types: Cigarettes    Quit date: 05/04/2009  . Smokeless tobacco: Never Used  . Alcohol Use: Yes   OB History    No data available     Review of Systems  Constitutional: Negative for fever.  Respiratory: Negative for shortness of breath.   Cardiovascular: Positive for leg swelling ( bilaterally). Negative for chest pain.  Gastrointestinal: Positive for diarrhea. Negative for nausea, vomiting and blood in stool.  Genitourinary: Negative for dysuria and hematuria.  Musculoskeletal: Positive for back pain and arthralgias.  Neurological: Positive for weakness and numbness.      Allergies  Review of patient's allergies indicates no known allergies.  Home Medications   Prior to Admission medications   Medication Sig  Start Date End Date Taking? Authorizing Provider  amitriptyline (ELAVIL) 75 MG tablet Take 75 mg by mouth at bedtime.   Yes Historical Provider, MD  aspirin 81 MG chewable tablet Chew 81 mg by mouth daily.   Yes Historical Provider, MD  Eluxadoline (VIBERZI) 100 MG TABS Take 1 tablet by mouth 2 (two) times daily. 10/02/15  Yes Jerene Bears, MD  furosemide (LASIX) 40 MG tablet Take 1 tablet (40 mg total) by mouth daily as needed for edema. 12/12/15  Yes Iline Oven, MD  gabapentin (NEURONTIN) 300 MG capsule  Take 600 mg (2 tabs) every morning and afternoon.  Take 900 mg (3 tabs) every evening. Patient taking differently: Take 600-900 mg by mouth 3 (three) times daily. 600mg  in the morning, 600mg  in the afternoon.  900mg  in the evening. 12/12/15  Yes Iline Oven, MD  pantoprazole (PROTONIX) 20 MG tablet Take 20 mg by mouth daily.   Yes Historical Provider, MD  trimethoprim-polymyxin b (POLYTRIM) ophthalmic solution Place 1 drop into the right eye every 4 (four) hours. 01/14/16  Yes Billy Fischer, MD  methocarbamol (ROBAXIN) 500 MG tablet Take 3 tablets (1,500 mg total) by mouth 3 (three) times daily. As needed. 01/16/16   Roxanna Mew, PA-C   BP 132/72 mmHg  Pulse 84  Temp(Src) 98.3 F (36.8 C) (Oral)  Resp 15  Ht 4\' 11"  (1.499 m)  Wt 127.914 kg  BMI 56.93 kg/m2  SpO2 100%  LMP 01/07/2016 Physical Exam  Constitutional: She appears well-developed and well-nourished. No distress.  HENT:  Head: Normocephalic and atraumatic.  Eyes: Conjunctivae are normal. Pupils are equal, round, and reactive to light. No scleral icterus.  Neck: Neck supple.  Cardiovascular: Normal rate, regular rhythm, normal heart sounds and intact distal pulses.   Pulmonary/Chest: Effort normal and breath sounds normal. No respiratory distress.  Abdominal: Soft. Bowel sounds are normal. There is no tenderness. There is no rebound and no guarding.  Musculoskeletal: She exhibits edema ( lower extremities bilaterally) and tenderness ( palpation of posterior lower extremities b/l).       Lumbar back: She exhibits tenderness and bony tenderness. She exhibits no deformity.  Lymphadenopathy:    She has no cervical adenopathy.  Neurological: She is alert. She has normal strength.  Sensation grossly intact right lower extremity and arms b/l. Decreased sensation on medial aspect of left lower extremity compared to lateral aspect.   Skin: Skin is warm and dry. She is not diaphoretic.  Psychiatric: She has a normal mood and  affect.    ED Course  Procedures (including critical care time) Labs Review Labs Reviewed - No data to display  Imaging Review No results found.   EKG Interpretation None      MDM   Final diagnoses:  Chronic pain  Bilateral edema of lower extremity    Patient is well appearing, resting comfortably in bed in no acute distress. History of chronic pain in low back and knees most likely secondary to history of arthritis. No recent trauma. Worsening pain may be medication tolerance for current pain regimen.   Well's criteria for DVT -2. Edema most likely dependent, patient is on her feet all day for work. Provided patient with toradol and steroid shot for symptom relief. Will refer patient to pain clinic for improved management of chronic pain. Will change muscle relaxer to robaxin to improve symptom relief. Encouraged follow up with PCP. Discussed treatment plan with patient. Patient voiced understanding and was agreeable.  Roxanna Mew, PA-C 01/17/16 [place] Tylar Amborn, PA-C 01/17/16 0120  Noemi Chapel, MD 01/19/16 2038

## 2016-01-16 NOTE — ED Notes (Signed)
EDP at bedside  

## 2016-01-16 NOTE — ED Provider Notes (Signed)
The pt is a 50 y/o female - she has some chronic pain in her leg and back - she is on her feet all day at work and has chronic swelling of the legs.  She has had some worsening pain in last couple of days - no numbness / weakness today or on exam - she can SLR bilaterally.  She has normal speech, normal UE exam with strength and sensationa nd normal CN 3-12 - she has pulses at the feet though they are week - she is scheduled for a second opinion at [facility] on Thursday next week.    She has no new neuro findings or symptoms - she has pain that is somewhat chronic  Decadron, Toradol,  Home with pain clinic f./u and change muscle relaxant to robaxin.  Pt in agreement with plan.  Medical screening examination/treatment/procedure(s) were conducted as a shared visit with non-physician practitioner(s) and myself.  I personally evaluated the patient during the encounter.  Clinical Impression:   Final diagnoses:  Chronic pain  Bilateral edema of lower extremity           Noemi Chapel, MD 01/19/16 2039

## 2016-01-17 ENCOUNTER — Encounter: Payer: Self-pay | Admitting: Internal Medicine

## 2016-01-17 NOTE — Telephone Encounter (Signed)
Thank you Edwena Blow, I will print a work excuse letter and slide it under Helen's door.

## 2016-01-17 NOTE — Telephone Encounter (Addendum)
Previous work excuse was given by Dr. Lovena Le on the condition she come up with and execute a plan to return to work. She was told the same when seen previously by the [facility]. She has been asking for these work excuse letters since July 2016.  She has recently been seen in our clinic on 01/13/16 and in the ED on 01/16/16. I am happy to write a work excuse letter for these days if she is requesting this to return to work. If she is requesting a letter to be excused from work for a longer period of time, I would want to know more about the type of work she does and her hopes/plans to return and how she will do this.

## 2016-01-17 NOTE — Telephone Encounter (Signed)
CSW placed call to Elizabeth Diaz and informed Elizabeth Diaz of PCP's response below.  Elizabeth Diaz states she does not need an excuse for prior visits.  However, Elizabeth Diaz is requesting a work excuse to be out until her surgery on May 12th.    Elizabeth Diaz plans to return to work after her 02/07/16 surgery with hopes the surgery will relieve pressure and allow her to stand for longer than she can currently tolerated.  Elizabeth Diaz also has several appointments scheduled between now and 02/07/16 which include: a recommended 2nd opinion at [facility], GI appointment to address frequent loose stools due to IBS and sleep study.  Elizabeth Diaz voiced concern that she is still incontinent of bowel and working around food.  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  Elizabeth Diaz states she is on her feet 9 hours a day.   Elizabeth Diaz notified CSW will forward this information to PCP.

## 2016-01-20 ENCOUNTER — Telehealth: Payer: Self-pay | Admitting: Licensed Clinical Social Worker

## 2016-01-20 NOTE — Telephone Encounter (Signed)
CSW received call from Ms. Elizabeth Diaz.  Pt was following up on her request for work excuse letter.  CSW notified Dr. Posey Pronto was in agreement and completed letter.  Letter was delivered to Triage RN.  This worker informed [name] letter will be waiting at the front desk for her to pick up.  Pt thankful and states she will pick up today.

## 2016-01-23 DIAGNOSIS — I10 Essential (primary) hypertension: Secondary | ICD-10-CM

## 2016-01-23 DIAGNOSIS — I70219 Atherosclerosis of native arteries of extremities with intermittent claudication, unspecified extremity: Secondary | ICD-10-CM | POA: Insufficient documentation

## 2016-01-28 ENCOUNTER — Telehealth: Payer: Self-pay | Admitting: Internal Medicine

## 2016-01-28 ENCOUNTER — Telehealth: Payer: Self-pay | Admitting: Emergency Medicine

## 2016-01-28 NOTE — Telephone Encounter (Signed)
Pt states she is still having some stomach pains and is not always able to "hold her bowels." Pt states she does not have money for a copay and would like to see if Dr. Hilarie Fredrickson would increase the Viberzi to see if that helps. She is currently taking 100mg  BID. If so she would also like to see if she can get the voucher for free drug like last time. Please advise.

## 2016-01-28 NOTE — Telephone Encounter (Signed)
Left message for pt to call back  °

## 2016-01-28 NOTE — Telephone Encounter (Signed)
errror

## 2016-01-29 NOTE — Telephone Encounter (Signed)
I am not able to increase Viberzi There are other medications which she may benefit from, specifically Lotronex. This drug also has risks which need to be discussed in person. I understand she is limited financially, however I am limited by what I can do over the phone in regards to new medication/treatment. If she still taking Viberzi now?

## 2016-01-30 NOTE — Telephone Encounter (Signed)
Spoke with pt and she is aware. States she is not taking the Viberzi now. Pt states she will keep her OV as scheduled to come and discuss options.

## 2016-01-30 NOTE — Telephone Encounter (Signed)
Left message for pt to call back  °

## 2016-02-03 ENCOUNTER — Encounter: Payer: BLUE CROSS/BLUE SHIELD | Admitting: Internal Medicine

## 2016-02-06 ENCOUNTER — Ambulatory Visit: Payer: BLUE CROSS/BLUE SHIELD

## 2016-02-07 ENCOUNTER — Encounter: Payer: Self-pay | Admitting: Pulmonary Disease

## 2016-02-07 ENCOUNTER — Ambulatory Visit (INDEPENDENT_AMBULATORY_CARE_PROVIDER_SITE_OTHER): Payer: BLUE CROSS/BLUE SHIELD | Admitting: Pulmonary Disease

## 2016-02-07 VITALS — BP 112/62 | HR 84 | Ht 59.0 in | Wt 284.0 lb

## 2016-02-07 DIAGNOSIS — G4733 Obstructive sleep apnea (adult) (pediatric): Secondary | ICD-10-CM

## 2016-02-07 DIAGNOSIS — R0609 Other forms of dyspnea: Secondary | ICD-10-CM | POA: Diagnosis not present

## 2016-02-07 DIAGNOSIS — J449 Chronic obstructive pulmonary disease, unspecified: Secondary | ICD-10-CM | POA: Diagnosis not present

## 2016-02-07 MED ORDER — BUDESONIDE-FORMOTEROL FUMARATE 160-4.5 MCG/ACT IN AERO: 2.0000 | INHALATION_SPRAY | Freq: Two times a day (BID) | RESPIRATORY_TRACT | Status: DC

## 2016-02-07 MED ORDER — ALBUTEROL SULFATE HFA 108 (90 BASE) MCG/ACT IN AERS: 2.0000 | INHALATION_SPRAY | Freq: Four times a day (QID) | RESPIRATORY_TRACT | Status: DC | PRN

## 2016-02-07 NOTE — Progress Notes (Signed)
Subjective:    Patient ID: Elizabeth Diaz, female    DOB: July 29, 1966, 50 y.o.   MRN: [id number]  HPI   This is the case of Elizabeth Diaz, 50 y.o. Female, who was referred by Dr. Lindon Romp  in consultation regarding OSA.   As you very well know, patient 40 PY smoking history, quit 15 yrs ago.  Not known to have asthma or copd.  Pt had hypersomnia, tiredness, fatigue, snoring, gasping, choking. Hypersomnia affected fxnality.  Pt had a sleep study which showed OSA.   Pt is not working but has hypersomnia daily. Naps daily.    Pt with severe knee pain surgery. Not sure if will have surgery.   Pt with PVD. Will have a stent placed in RUE vein.    Recent SOB with ADLs.     Review of Systems  Constitutional: Positive for unexpected weight change. Negative for fever.  HENT: Negative for congestion, dental problem, ear pain, nosebleeds, postnasal drip, rhinorrhea, sinus pressure, sneezing, sore throat and trouble swallowing.   Eyes: Negative.  Negative for redness and itching.  Respiratory: Positive for shortness of breath and wheezing. Negative for cough and chest tightness.   Cardiovascular: Positive for palpitations and leg swelling.  Gastrointestinal: Negative.  Negative for nausea and vomiting.  Endocrine: Negative.   Genitourinary: Negative.  Negative for dysuria.  Musculoskeletal: Positive for joint swelling and arthralgias.  Skin: Negative.  Negative for rash.  Allergic/Immunologic: Negative.   Neurological: Positive for dizziness, light-headedness and headaches.  Hematological: Negative.  Does not bruise/bleed easily.  Psychiatric/Behavioral: Negative.  Negative for dysphoric mood. The patient is not nervous/anxious.    Past Medical History  Diagnosis Date  . Pneumothorax   . Arthritis   . Numbness     left leg  . Hyperplastic colon polyp   . Diverticulosis   . Obesity   . Seizures (HCC)     hx of, no seizures last few years  . Schatzki's ring    (-)  DVT, CA  Family History  Problem Relation Age of Onset  . Heart failure Father   . Diabetes Maternal Grandmother   . Diabetes Paternal Grandfather   . Diabetes Father   . Cancer Maternal Grandfather     unknown type     Past Surgical History  Procedure Laterality Date  . Chest tube insertion    . Tubal ligation    . Colonoscopy with propofol N/A 11/01/2014    Procedure: COLONOSCOPY WITH PROPOFOL;  Surgeon: Jerene Bears, MD;  Location: [facility];  Service: Gastroenterology;  Laterality: N/A;  . Esophagogastroduodenoscopy (egd) with propofol N/A 07/29/2015    Procedure: ESOPHAGOGASTRODUODENOSCOPY (EGD) WITH PROPOFOL;  Surgeon: Jerene Bears, MD;  Location: [facility];  Service: Gastroenterology;  Laterality: N/A;  . Peripheral vascular catheterization N/A 01/07/2016    Procedure: Lower Extremity Angiography;  Surgeon: Adrian Prows, MD;  Location: [facility];  Service: Cardiovascular;  Laterality: N/A;    Social History   Social History  . Marital Status: Single    Spouse Name: N/A  . Number of Children: 2  . Years of Education: N/A   Occupational History  . [employer]    Social History Main Topics  . Smoking status: Former Smoker -- 1.50 packs/day for 0 years    Types: Cigarettes    Quit date: 05/04/2009  . Smokeless tobacco: Never Used  . Alcohol Use: Yes  . Drug Use: No     Comment: 20 year  history of crack cocaine. Haven't used any in two years.   . Sexual Activity:    Partners: Female   Other Topics Concern  . Not on file   Social History Narrative   Lives in [place]    Works in [employer] at [employer] group)   Hobbies: listening to music. Bowling.      No Known Allergies   Outpatient Prescriptions Prior to Visit  Medication Sig Dispense Refill  . amitriptyline (ELAVIL) 75 MG tablet Take 75 mg by mouth at bedtime.    Marland Kitchen aspirin 81 MG chewable tablet Chew 81 mg by mouth daily.    . Eluxadoline (VIBERZI) 100 MG TABS Take 1 tablet by mouth 2  (two) times daily. 60 tablet 3  . furosemide (LASIX) 40 MG tablet Take 1 tablet (40 mg total) by mouth daily as needed for edema. 30 tablet 3  . gabapentin (NEURONTIN) 300 MG capsule Take 600 mg (2 tabs) every morning and afternoon.  Take 900 mg (3 tabs) every evening. (Patient taking differently: Take 600-900 mg by mouth 3 (three) times daily. 600mg  in the morning, 600mg  in the afternoon.  900mg  in the evening.) 120 capsule 3  . methocarbamol (ROBAXIN) 500 MG tablet Take 3 tablets (1,500 mg total) by mouth 3 (three) times daily. As needed. 30 tablet 0  . pantoprazole (PROTONIX) 20 MG tablet Take 20 mg by mouth daily.    Marland Kitchen trimethoprim-polymyxin b (POLYTRIM) ophthalmic solution Place 1 drop into the right eye every 4 (four) hours. 10 mL 0   No facility-administered medications prior to visit.   Meds ordered this encounter  Medications  . cyclobenzaprine (FLEXERIL) 10 MG tablet    Sig: Take 1 tablet by mouth 3 (three) times daily.  . ferrous sulfate 325 (65 FE) MG tablet    Sig: Take 2 tablets by mouth every other day.  Marland Kitchen atorvastatin (LIPITOR) 20 MG tablet    Sig: Take 20 mg by mouth daily.  . budesonide-formoterol (SYMBICORT) 160-4.5 MCG/ACT inhaler    Sig: Inhale 2 puffs into the lungs 2 (two) times daily.    Dispense:  1 Inhaler    Refill:  12  . albuterol (PROVENTIL HFA;VENTOLIN HFA) 108 (90 Base) MCG/ACT inhaler    Sig: Inhale 2 puffs into the lungs every 6 (six) hours as needed for wheezing or shortness of breath.    Dispense:  1 Inhaler    Refill:  6          Objective:   Physical Exam  Vitals:  Filed Vitals:   02/07/16 1415  BP: 112/62  Pulse: 84  Height: 4\' 11"  (1.499 m)  Weight: 284 lb (128.822 kg)  SpO2: 96%    Constitutional/General:  Pleasant, well-nourished, well-developed, not in any distress,  Comfortably seating.  Well kempt  Body mass index is 57.33 kg/(m^2). Wt Readings from Last 3 Encounters:  02/07/16 284 lb (128.822 kg)  01/16/16 282 lb (127.914  kg)  01/13/16 283 lb 14.4 oz (128.776 kg)      HEENT: Pupils equal and reactive to light and accommodation. Anicteric sclerae. Normal nasal mucosa.   No oral  lesions,  mouth clear,  oropharynx clear, no postnasal drip. (-) Oral thrush. No dental caries.  Airway - Mallampati class IV  Neck: No masses. Midline trachea. No JVD, (-) LAD. (-) bruits appreciated. Short stout neck  Respiratory/Chest: Grossly normal chest. (-) deformity. (-) Accessory muscle use.  Symmetric expansion. (-) Tenderness on palpation.  Resonant on percussion.  Diminished  BS on both lower lung zones. (-) wheezing, crackles, rhonchi (-) egophony  Cardiovascular: Regular rate and  rhythm, heart sounds normal, no murmur or gallops, no peripheral edema  Gastrointestinal:  Normal bowel sounds. Soft, non-tender. No hepatosplenomegaly.  (-) masses.   Musculoskeletal:  Normal muscle tone. Normal gait.   Extremities: Grossly normal. (-) clubbing, cyanosis.  (-) edema  Skin: (-) rash,lesions seen.   Neurological/Psychiatric : alert, oriented to time, place, person. Normal mood and affect           Assessment & Plan:  OSA (obstructive sleep apnea) Pt had hypersomnia, tiredness, fatigue, snoring, gasping, choking. Hypersomnia affected fxnality. Pt had a sleep study which showed OSA. AHI was 7.  Pt is not working but has hypersomnia daily. Naps daily.  Plan:  1. Start auto CPAP 5-15 cm water. Anticipate no issues with CPAP. Even if its mild OSA, plan to treat OSA as she is very symptomatic.  2. Patient is on pain medicines. Also, will be having angioplasty for PVD per pt.   Exertional dyspnea Recent exertional dyspnea. Likely related to RVD, morbid obesity. Need to rule out obesity hypoventilation syndrome. Patient with significant smoking history. Likely with COPD as well. 40-pack-year smoking history, quit when she was 30.  Patient also sees Dr. Einar Gip for HTN/PVD. Chest CTA (11/2015)  no pulmonary  embolism. RVD.   Plan : 1. PFT, ABG. 2. Start symbicort 160/4.5 2P BID and alb prn.    Morbid obesity ([place]) Weight reduction     I personally reviewed previous images (Chest Xray, Chest Ct scan) done on this patient. I reviewed the reports on the images as well.    Thank you very much for letting me participate in this patient's care. Please do not hesitate to give me a call if you have any questions or concerns regarding the treatment plan.   Patient will follow up with me in 3 mos.    Monica Becton, MD 02/07/2016   3:04 PM Pulmonary and [place] Pager: [phone] Office: [phone], Fax: [phone]

## 2016-02-07 NOTE — Patient Instructions (Addendum)
1. We will order you an auto CPAP machine 5-15 cm water. Let us know if you don't get a call within the next week or so. 2. We will start Symbicort, 160/4.5, 2 puffs twice a day. Rinse your mouth each time you use it. Let us know if you having issues with it.     Start albuterol 2 puffs every 4 hrs as needed.  3. We will get a breathing test and a blood test.  Return to clinic in 3 mos

## 2016-02-07 NOTE — Assessment & Plan Note (Signed)
Recent exertional dyspnea. Likely related to RVD, morbid obesity. Need to rule out obesity hypoventilation syndrome. Patient with significant smoking history. Likely with COPD as well. 40-pack-year smoking history, quit when she was 30.  Patient also sees Dr. Einar Gip for HTN/PVD. Chest CTA (11/2015)  no pulmonary embolism. RVD.   Plan : 1. PFT, ABG. 2. Start symbicort 160/4.5 2P BID and alb prn.

## 2016-02-07 NOTE — Assessment & Plan Note (Signed)
Weight reduction 

## 2016-02-07 NOTE — Assessment & Plan Note (Signed)
Pt had hypersomnia, tiredness, fatigue, snoring, gasping, choking. Hypersomnia affected fxnality. Pt had a sleep study which showed OSA. AHI was 7.  Pt is not working but has hypersomnia daily. Naps daily.  Plan:  1. Start auto CPAP 5-15 cm water. Anticipate no issues with CPAP. Even if its mild OSA, plan to treat OSA as she is very symptomatic.  2. Patient is on pain medicines. Also, will be having angioplasty for PVD per pt.

## 2016-02-11 ENCOUNTER — Encounter: Payer: Self-pay | Admitting: Internal Medicine

## 2016-02-11 ENCOUNTER — Ambulatory Visit (INDEPENDENT_AMBULATORY_CARE_PROVIDER_SITE_OTHER): Payer: BLUE CROSS/BLUE SHIELD | Admitting: Internal Medicine

## 2016-02-11 VITALS — BP 124/70 | HR 82 | Ht 59.0 in | Wt 276.0 lb

## 2016-02-11 DIAGNOSIS — K219 Gastro-esophageal reflux disease without esophagitis: Secondary | ICD-10-CM

## 2016-02-11 DIAGNOSIS — R1030 Lower abdominal pain, unspecified: Secondary | ICD-10-CM

## 2016-02-11 DIAGNOSIS — K580 Irritable bowel syndrome with diarrhea: Secondary | ICD-10-CM

## 2016-02-11 MED ORDER — LOPERAMIDE HCL 2 MG PO TABS: 2.0000 mg | ORAL_TABLET | Freq: Three times a day (TID) | ORAL | Status: DC

## 2016-02-11 NOTE — Progress Notes (Signed)
   Subjective:    Patient ID: Elizabeth Diaz, female    DOB: 11-24-1965, 50 y.o.   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 She was last seen in January 2017. She was tried on Viberzi 100 mg twice a day. She took this for one month with no improvement. She continues to have 8-10 loose stools per day, worse after eating. She reports this limits her functioning and leads to accidents which she finds very embarrassing. At times she does not want to leave her house. He can be associated with mid and lower abdominal cramping pain. At times she has nocturnal stools. She denies rectal bleeding or melena with her bowel movements. No definite trigger. She reports stable weight by our scales weight is down 12 pounds since her last visit.  Previous colonoscopy was performed to evaluate chronic diarrhea. This was performed on 11/01/2014. 2 polyps were removed from the rectum. There was mild diverticulosis in the sigmoid colon. Random biopsies were performed. The polyps were found to be hyperplastic and random biopsies were normal.   Review of Systems As per history of present illness, otherwise negative  Current Medications, Allergies, Past Medical History, Past Surgical History, Family History and Social History were reviewed in [facility] record.     Objective:   Physical Exam BP 124/70 mmHg  Pulse 82  Ht 4\' 11"  (1.499 m)  Wt 276 lb (125.193 kg)  BMI 55.72 kg/m2  LMP 02/04/2016 (Approximate) Constitutional: Well-developed and well-nourished. No distress. HEENT: Normocephalic and atraumatic. Oropharynx is clear and moist. No oropharyngeal exudate. Conjunctivae are normal.  No scleral icterus. Cardiovascular: Normal rate, regular rhythm and intact distal pulses. No M/R/G Pulmonary/chest: Effort normal and breath sounds normal. No wheezing, rales or  rhonchi. Abdominal: Soft, obese, nontender, nondistended. Bowel sounds active throughout. s, or edema. Neurological: Alert and oriented to person place and time. Skin: Skin is warm and dry.  Psychiatric: Normal mood and affect. Behavior is normal.  Fecal elastase -- normal GI pathogen panel -- unremarkable Celiac evaluation negative, neg duodenal bx      Assessment & Plan:  50 year old female with history of chronic diarrhea felt to be irritable bowel related who is here for follow-up  1. IBS-D -- Significant irritable bowel with diarrhea limiting functioning and social interactions. No improvement with Viberzi. Given the abdominal pain that she is also having, I recommended CT scan of the abdomen and pelvis with contrast. This certainly could be all irritable bowel related but I would like to rule out other abdominal pathology. Trial of loperamide 2 mg 3 times a day. If after 2-4 weeks she's had no improvement she is asked to notify my office. My next recommendation would be WelChol 1.875 g twice a day. If no improvement after this she may be a candidate for Lotronex. We discussed at length the risks, though rare, of severe constipation and even ischemic colitis. She understands that if this medications you she would need to sign the consent form. She states she would be willing to try this medication if other medications fail due to severity of her symptoms.  2. GERD -- good response to PPI. Continue pantoprazole 20 mg daily  3. H. pylori gastritis -- treated with confirmation of eradication.  25 minutes spent with the patient today. Greater than 50% was spent in counseling and coordination of care with the patient

## 2016-02-11 NOTE — Patient Instructions (Signed)
Please call our office in 2 week with an update on your symptoms. Our phone number is [phone].  We have sent the following medications to your pharmacy for you to pick up at your convenience: Loperamide 2 mg three times daily  You have been scheduled for a CT scan of the abdomen and pelvis at [facility] ([address]---this is in the same building as [facility]).   You are scheduled for CT abdomen/pelvis on Monday, 02/17/16 at 9:30 am. You should arrive 15 minutes prior to your appointment time for registration. Please follow the written instructions below on the day of your exam:  WARNING: IF YOU ARE ALLERGIC TO IODINE/X-RAY DYE, PLEASE NOTIFY RADIOLOGY IMMEDIATELY AT [phone]! YOU WILL BE GIVEN A 13 HOUR PREMEDICATION PREP.  1) Do not eat or drink anything after 5:30 am (4 hours prior to your test) 2) You have been given 2 bottles of oral contrast to drink. The solution may taste better if refrigerated, but do NOT add ice or any other liquid to this solution. Shake well before drinking.    Drink 1 bottle of contrast @ 7:30 am (2 hours prior to your exam)  Drink 1 bottle of contrast @ 8:30 am (1 hour prior to your exam)  You may take any medications as prescribed with a small amount of water except for the following: Metformin, Glucophage, Glucovance, Avandamet, Riomet, Fortamet, Actoplus Met, Janumet, Glumetza or Metaglip. The above medications must be held the day of the exam AND 48 hours after the exam.  The purpose of you drinking the oral contrast is to aid in the visualization of your intestinal tract. The contrast solution may cause some diarrhea. Before your exam is started, you will be given a small amount of fluid to drink. Depending on your individual set of symptoms, you may also receive an intravenous injection of x-ray contrast/dye. Plan on being at [facility] for 30 minutes or longer, depending on the type of exam you are having  performed.  This test typically takes 30-45 minutes to complete.  If you have any questions regarding your exam or if you need to reschedule, you may call the CT department at [phone] between the hours of 8:00 am and 5:00 pm, Monday-Friday.  ________________________________________________________________________  If you are age 5 or older, your body mass index should be between 23-30. Your Body mass index is 55.72 kg/(m^2). If this is out of the aforementioned range listed, please consider follow up with your Primary Care Provider.  If you are age 25 or younger, your body mass index should be between 19-25. Your Body mass index is 55.72 kg/(m^2). If this is out of the aformentioned range listed, please consider follow up with your Primary Care Provider.

## 2016-02-12 ENCOUNTER — Encounter (HOSPITAL_COMMUNITY): Payer: Self-pay

## 2016-02-12 ENCOUNTER — Other Ambulatory Visit: Payer: Self-pay

## 2016-02-12 ENCOUNTER — Emergency Department (HOSPITAL_COMMUNITY): Payer: BLUE CROSS/BLUE SHIELD

## 2016-02-12 ENCOUNTER — Emergency Department (HOSPITAL_COMMUNITY)
Admission: EM | Admit: 2016-02-12 | Discharge: 2016-02-12 | Disposition: A | Discharge status: Home / Self Care | Payer: BLUE CROSS/BLUE SHIELD | Attending: Emergency Medicine | Admitting: Emergency Medicine

## 2016-02-12 DIAGNOSIS — R0789 Other chest pain: Secondary | ICD-10-CM | POA: Insufficient documentation

## 2016-02-12 DIAGNOSIS — E669 Obesity, unspecified: Secondary | ICD-10-CM | POA: Diagnosis not present

## 2016-02-12 DIAGNOSIS — M1990 Unspecified osteoarthritis, unspecified site: Secondary | ICD-10-CM | POA: Diagnosis not present

## 2016-02-12 DIAGNOSIS — Z7982 Long term (current) use of aspirin: Secondary | ICD-10-CM | POA: Diagnosis not present

## 2016-02-12 DIAGNOSIS — R51 Headache: Secondary | ICD-10-CM | POA: Insufficient documentation

## 2016-02-12 DIAGNOSIS — Z87891 Personal history of nicotine dependence: Secondary | ICD-10-CM | POA: Insufficient documentation

## 2016-02-12 DIAGNOSIS — R079 Chest pain, unspecified: Secondary | ICD-10-CM

## 2016-02-12 DIAGNOSIS — R519 Headache, unspecified: Secondary | ICD-10-CM

## 2016-02-12 LAB — CBC
HCT: 35.5 % — ABNORMAL LOW (ref 36.0–46.0)
Hemoglobin: 12 g/dL (ref 12.0–15.0)
MCH: 31.1 pg (ref 26.0–34.0)
MCHC: 33.8 g/dL (ref 30.0–36.0)
MCV: 92 fL (ref 78.0–100.0)
PLATELETS: 326 10*3/uL (ref 150–400)
RBC: 3.86 MIL/uL — AB (ref 3.87–5.11)
RDW: 12.8 % (ref 11.5–15.5)
WBC: 9.6 10*3/uL (ref 4.0–10.5)

## 2016-02-12 LAB — BASIC METABOLIC PANEL
Anion gap: 6 (ref 5–15)
BUN: 14 mg/dL (ref 6–20)
CALCIUM: 8.7 mg/dL — AB (ref 8.9–10.3)
CO2: 26 mmol/L (ref 22–32)
CREATININE: 0.59 mg/dL (ref 0.44–1.00)
Chloride: 105 mmol/L (ref 101–111)
GFR calc non Af Amer: >60 mL/min (ref >60)
Glucose, Bld: 105 mg/dL — ABNORMAL HIGH (ref 65–99)
Potassium: 3.6 mmol/L (ref 3.5–5.1)
SODIUM: 137 mmol/L (ref 135–145)

## 2016-02-12 LAB — I-STAT TROPONIN, ED
TROPONIN I, POC: 0 ng/mL (ref 0.00–0.08)
TROPONIN I, POC: 0.01 ng/mL (ref 0.00–0.08)

## 2016-02-12 LAB — BRAIN NATRIURETIC PEPTIDE: B Natriuretic Peptide: 33.1 pg/mL (ref 0.0–100.0)

## 2016-02-12 MED ORDER — ONDANSETRON HCL 4 MG/2ML IJ SOLN
4.0000 mg | Freq: Once | INTRAMUSCULAR | Status: AC
  Administered 2016-02-12: 4 mg via INTRAVENOUS
  Filled 2016-02-12: qty 2

## 2016-02-12 MED ORDER — DIPHENHYDRAMINE HCL 50 MG/ML IJ SOLN
25.0000 mg | Freq: Once | INTRAMUSCULAR | Status: AC
  Administered 2016-02-12: 25 mg via INTRAVENOUS
  Filled 2016-02-12: qty 1

## 2016-02-12 NOTE — ED Provider Notes (Signed)
CSN: [id number]     Arrival date & time 02/12/16  1452 History   First MD Initiated Contact with Patient 02/12/16 1734     Chief Complaint  Patient presents with  . Chest Pain     (Consider location/radiation/quality/duration/timing/severity/associated sxs/prior Treatment) HPI Comments: Elizabeth Diaz is a 50 y.o. female with history of chronic knee pain, PVD, obesity, and arthritis presents to ED with complaint of chest pain and headache. Chest pain started today around 11am, she was sitting watching TV. Pain is 6-7/10, sharp, and constant in nature. Pain radiates to left shoulder. Patient does endorse pain has improved since initial onset. Associated symptoms include shortness of breath and non-productive cough. No recent trauma.   Per headache, patient reports intermittent headaches for the last three months. Current headache started three days ago. Patient states headache "just came on." Described as 10/10, located in the frontal region, and a dull/constant ache in nature. Associated photophobia, phonophobia, and nausea (nausea has resolved). No fever. No neck pain or stiffness. Has tried aspirin with no relief of discomfort. Patent states this headache is the exact same as previous headaches she has experienced over the last three months. No trauma.   Patient is a 50 y.o. female presenting with chest pain. The history is provided by the patient.  Chest Pain Pain location:  L chest and R chest Pain quality: sharp   Associated symptoms: abdominal pain ( chronic), cough ( nonproductive), diaphoresis ( night), headache, numbness ( chronic neuropathy) and shortness of breath   Associated symptoms: no dizziness, no fever, no nausea, not vomiting and no weakness     Past Medical History  Diagnosis Date  . Pneumothorax   . Arthritis   . Numbness     left leg  . Hyperplastic colon polyp   . Diverticulosis   . Obesity   . Seizures (HCC)     hx of, no seizures last few years  .  Schatzki's ring    Past Surgical History  Procedure Laterality Date  . Chest tube insertion    . Tubal ligation    . Colonoscopy with propofol N/A 11/01/2014    Procedure: COLONOSCOPY WITH PROPOFOL;  Surgeon: Jerene Bears, MD;  Location: [facility];  Service: Gastroenterology;  Laterality: N/A;  . Esophagogastroduodenoscopy (egd) with propofol N/A 07/29/2015    Procedure: ESOPHAGOGASTRODUODENOSCOPY (EGD) WITH PROPOFOL;  Surgeon: Jerene Bears, MD;  Location: [facility];  Service: Gastroenterology;  Laterality: N/A;  . Peripheral vascular catheterization N/A 01/07/2016    Procedure: Lower Extremity Angiography;  Surgeon: Adrian Prows, MD;  Location: [place] [facility];  Service: Cardiovascular;  Laterality: N/A;   Family History  Problem Relation Age of Onset  . Heart failure Father   . Diabetes Maternal Grandmother   . Diabetes Paternal Grandfather   . Diabetes Father   . Cancer Maternal Grandfather     unknown type   Social History  Substance Use Topics  . Smoking status: Former Smoker -- 1.50 packs/day for 0 years    Types: Cigarettes    Quit date: 05/04/2009  . Smokeless tobacco: Never Used  . Alcohol Use: Yes   OB History    No data available     Review of Systems  Constitutional: Positive for chills and diaphoresis ( night). Negative for fever.  HENT: Negative for sore throat.   Eyes: Positive for discharge ( watery).  Respiratory: Positive for cough ( nonproductive) and shortness of breath.   Cardiovascular: Positive for chest pain and  leg swelling ( bilateral - chronic).  Gastrointestinal: Positive for abdominal pain ( chronic) and diarrhea ( chronic). Negative for nausea, vomiting, constipation and blood in stool.  Genitourinary: Negative for dysuria, frequency and hematuria.  Musculoskeletal: Positive for arthralgias ( chronic left knee ). Negative for neck pain and neck stiffness.  Skin: Negative for rash.  Neurological: Positive for numbness ( chronic neuropathy)  and headaches. Negative for dizziness, weakness and light-headedness.      Allergies  Review of patient's allergies indicates no known allergies.  Home Medications   Prior to Admission medications   Medication Sig Start Date End Date Taking? Authorizing Provider  amitriptyline (ELAVIL) 75 MG tablet Take 75 mg by mouth at bedtime.   Yes Historical Provider, MD  aspirin 81 MG chewable tablet Chew 81 mg by mouth daily.   Yes Historical Provider, MD  atorvastatin (LIPITOR) 20 MG tablet Take 20 mg by mouth daily.   Yes Historical Provider, MD  budesonide-formoterol (SYMBICORT) 160-4.5 MCG/ACT inhaler Inhale 2 puffs into the lungs 2 (two) times daily. 02/07/16  Yes [place], MD  cyclobenzaprine (FLEXERIL) 10 MG tablet Take 1 tablet by mouth 3 (three) times daily.   Yes Historical Provider, MD  ferrous sulfate 325 (65 FE) MG tablet Take 2 tablets by mouth every other day.   Yes Historical Provider, MD  furosemide (LASIX) 40 MG tablet Take 1 tablet (40 mg total) by mouth daily as needed for edema. 12/12/15  Yes Iline Oven, MD  gabapentin (NEURONTIN) 300 MG capsule Take 600 mg (2 tabs) every morning and afternoon.  Take 900 mg (3 tabs) every evening. Patient taking differently: Take 600-900 mg by mouth 3 (three) times daily. 600mg  in the morning, 600mg  in the afternoon.  900mg  in the evening. 12/12/15  Yes Iline Oven, MD  loperamide (IMODIUM A-D) 2 MG tablet Take 1 tablet (2 mg total) by mouth 3 (three) times daily. 02/11/16  Yes Jerene Bears, MD  pantoprazole (PROTONIX) 20 MG tablet Take 20 mg by mouth daily.   Yes Historical Provider, MD  albuterol (PROVENTIL HFA;VENTOLIN HFA) 108 (90 Base) MCG/ACT inhaler Inhale 2 puffs into the lungs every 6 (six) hours as needed for wheezing or shortness of breath. Patient not taking: Reported on 02/12/2016 02/07/16   Rush Landmark, MD  Eluxadoline (VIBERZI) 100 MG TABS Take 1 tablet by mouth 2 (two) times daily. Patient not taking:  Reported on 02/12/2016 10/02/15   Jerene Bears, MD   BP 155/109 mmHg  Pulse 77  Temp(Src) 98.1 F (36.7 C) (Oral)  Resp 26  SpO2 100%  LMP 02/04/2016 (Approximate) Physical Exam  Constitutional: She appears well-developed and well-nourished. No distress.  HENT:  Head: Normocephalic and atraumatic.  Mouth/Throat: Oropharynx is clear and moist. No oropharyngeal exudate.  Eyes: Conjunctivae and EOM are normal. Pupils are equal, round, and reactive to light. Right eye exhibits no discharge. Left eye exhibits no discharge. No scleral icterus.  Neck: Normal range of motion. Neck supple.  Cardiovascular: Normal rate, regular rhythm, normal heart sounds and intact distal pulses.   No murmur heard. Pulmonary/Chest: Effort normal and breath sounds normal. No respiratory distress. She exhibits tenderness ( TTP of left and right chest wall).  Abdominal: Soft. Bowel sounds are normal. There is no tenderness. There is no rebound and no guarding.  Musculoskeletal: Normal range of motion.  Lymphadenopathy:    She has no cervical adenopathy.  Neurological: She is alert. She has normal strength.  No sensory deficit. She displays a negative Romberg sign. Coordination and gait normal.  Decrease hearing in right ear (per pt chronic), decrease sensation in left CN VII (per pt chronic from laceration), decrease sensation in left extremity (per pt chronic from laceration). Strength intact in all extremities. Sensation intact in lower extremities. No facial droop. Normal finger to nose. Patient unable to complete heel to shin secondary to chronic knee pain. Patient is ambulatory.   Skin: Skin is warm and dry. She is not diaphoretic.  Psychiatric: She has a normal mood and affect. Her behavior is normal.    ED Course  Procedures (including critical care time) Labs Review Labs Reviewed  BASIC METABOLIC PANEL - Abnormal; Notable for the following:    Glucose, Bld 105 (*)    Calcium 8.7 (*)    All other  components within normal limits  CBC - Abnormal; Notable for the following:    RBC 3.86 (*)    HCT 35.5 (*)    All other components within normal limits  BRAIN NATRIURETIC PEPTIDE  I-STAT TROPOININ, ED  Randolm Idol, ED    Imaging Review Dg Chest 2 View  02/12/2016  CLINICAL DATA:  Left-sided chest pain and shortness of breath today. EXAM: CHEST  2 VIEW COMPARISON:  01/09/2016 and 12/04/2015 FINDINGS: Lungs are adequately inflated with linear scarring over the mid to lower lungs bilaterally. No lobar consolidation or effusion. Cardiomediastinal silhouette and remainder of the exam is unchanged. IMPRESSION: No acute cardiopulmonary disease. Linear scarring over the mid to lower lungs. Electronically Signed   By: Marin Olp M.D.   On: 02/12/2016 16:04   I have personally reviewed and evaluated these images and lab results as part of my medical decision-making.   EKG Interpretation   Date/Time:  Wednesday Feb 12 2016 15:04:17 EDT Ventricular Rate:  79 PR Interval:  130 QRS Duration: 97 QT Interval:  403 QTC Calculation: 462 R Axis:   52 Text Interpretation:  Sinus rhythm Low voltage, precordial leads Posterior  infarct, old Since last tracing rate slower Confirmed by [facility]  MD,  ELLIOTT [phone]) on 02/12/2016 7:19:00 PM      MDM   Final diagnoses:  Chest pain, unspecified chest pain type  Nonintractable headache, unspecified chronicity pattern, unspecified headache type    AERIAL PLUNK is a 50 y.o. female with h/o chronic knee pain, PVD, prediabetes, and OA presents to ED with complaint of chest pain and headache. Patient is afebrile and non-toxic. Blood pressure elevated. TTP of left chest wall reproduces chest pain.   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 Troponin negative. Heart score 2, risk of MACE 0.9-1/7% - doubt ACS. Well's criteria negative - doubt pulmonary embolism. Chest x-ray negative for effusion, consolidation, or pneumothorax. Doubt esophageal  rupture - no free air under diaphragm on x-ray. BNP within normal limits - doubt heart failure. Suspect chest pain is musculoskeletal in nature. Pain is reproducible to palpation of chest wall. Discussed use of tylenol or ibuprofen for pain relief.   Per headache - Per patient, headache has been the exact same as previous headaches over the last few months. No neck pain or stiffness. Patient is ambulatory. Normal finger to nose. Strength intact in all extremities. No new neurologic symptoms. Provided benadryl and zofran for relief. Seen by Dr. Eulis Foster as well - patient noted to have TTP of trapezius. Doubt SAH, hematoma, stroke, or infectious process. On re-evaluation patient endorsed improvement in headache. Possible ?tension headache vs. ?migraine. Provided  contact information for headache wellness center for further evaluation and management. Discussed use of tylenol for symptomatic relief of headache as well as warm compresses or ice packs.  Discussed results and plan with patient. Provided return precautions. Encouraged follow up with PCP in the next few days for re-evaluation. Patient voiced understanding and is agreeable.      Roxanna Mew, [place] 02/13/16 [id number]  Daleen Bo, MD 02/14/16 2001

## 2016-02-12 NOTE — ED Notes (Signed)
PA at bedside.

## 2016-02-12 NOTE — Discharge Instructions (Signed)
Read the information below.  You can takeTylenol or ibuprofen for pain relief. You can also try warm compresses and gentle stretching of your neck. Be sure to follow up with your PCP in the next three days for re-evaluation. I have provided contact information for headache wellness center.  You may return to the Emergency Department at any time for worsening condition or any new symptoms that concern you. Return to the ED if you develop worsening symptoms or fevers, chest pain, shortness of breath, new neurologic symptoms (numbness, weakness, loss of consciousness).    General Headache Without Cause A headache is pain or discomfort felt around the head or neck area. There are many causes and types of headaches. In some cases, the cause may not be found.  HOME CARE  Managing Pain  Take over-the-counter and prescription medicines only as told by your doctor.  Lie down in a dark, quiet room when you have a headache.  If directed, apply ice to the head and neck area:  Put ice in a plastic bag.  Place a towel between your skin and the bag.  Leave the ice on for 20 minutes, 2-3 times per day.  Use a heating pad or hot shower to apply heat to the head and neck area as told by your doctor.  Keep lights dim if bright lights bother you or make your headaches worse. Eating and Drinking  Eat meals on a regular schedule.  Lessen how much alcohol you drink.  Lessen how much caffeine you drink, or stop drinking caffeine. General Instructions  Keep all follow-up visits as told by your doctor. This is important.  Keep a journal to find out if certain things bring on headaches. For example, write down:  What you eat and drink.  How much sleep you get.  Any change to your diet or medicines.  Relax by getting a massage or doing other relaxing activities.  Lessen stress.  Sit up straight. Do not tighten (tense) your muscles.  Do not use tobacco products. This includes cigarettes, chewing  tobacco, or e-cigarettes. If you need help quitting, ask your doctor.  Exercise regularly as told by your doctor.  Get enough sleep. This often means 7-9 hours of sleep. GET HELP IF:  Your symptoms are not helped by medicine.  You have a headache that feels different than the other headaches.  You feel sick to your stomach (nauseous) or you throw up (vomit).  You have a fever. GET HELP RIGHT AWAY IF:   Your headache becomes really bad.  You keep throwing up.  You have a stiff neck.  You have trouble seeing.  You have trouble speaking.  You have pain in the eye or ear.  Your muscles are weak or you lose muscle control.  You lose your balance or have trouble walking.  You feel like you will pass out (faint) or you pass out.  You have confusion.   This information is not intended to replace advice given to you by your health care provider. Make sure you discuss any questions you have with your health care provider.   Document Released: 06/23/2008 Document Revised: 06/05/2015 Document Reviewed: 01/07/2015 Elsevier Interactive Patient Education [employer].

## 2016-02-12 NOTE — ED Notes (Signed)
She c/o left-sided chest pain since yesterday--worse today.  Also c/o generalized h/ax 1 week.

## 2016-02-12 NOTE — ED Provider Notes (Signed)
  Face-to-face evaluation   History: She claims a several days of pain in chest and headache. No known trauma. No fever, chills, nausea, vomiting.  Physical exam: Obese, alert, cooperative, ambulating easily. Has mild left anterior tenderness. Neck tender left trapezius, to light touch.  Medical screening examination/treatment/procedure(s) were conducted as a shared visit with non-physician practitioner(s) and myself.  I personally evaluated the patient during the encounter  Daleen Bo, MD 02/12/16 2017

## 2016-02-12 NOTE — ED Notes (Signed)
Patient was alert, oriented and stable upon discharge. RN went over AVS and patient had no further questions.  

## 2016-02-14 ENCOUNTER — Ambulatory Visit: Payer: BLUE CROSS/BLUE SHIELD | Admitting: Internal Medicine

## 2016-02-14 ENCOUNTER — Other Ambulatory Visit: Payer: Self-pay | Admitting: Family Medicine

## 2016-02-17 ENCOUNTER — Ambulatory Visit (INDEPENDENT_AMBULATORY_CARE_PROVIDER_SITE_OTHER)
Admission: RE | Admit: 2016-02-17 | Discharge: 2016-02-17 | Disposition: A | Discharge status: Home / Self Care | Payer: BLUE CROSS/BLUE SHIELD | Source: Ambulatory Visit | Attending: Internal Medicine | Admitting: Internal Medicine

## 2016-02-17 DIAGNOSIS — R1030 Lower abdominal pain, unspecified: Secondary | ICD-10-CM | POA: Diagnosis not present

## 2016-02-17 DIAGNOSIS — K580 Irritable bowel syndrome with diarrhea: Secondary | ICD-10-CM | POA: Diagnosis not present

## 2016-02-17 MED ORDER — IOPAMIDOL (ISOVUE-300) INJECTION 61%
100.0000 mL | Freq: Once | INTRAVENOUS | Status: AC | PRN
  Administered 2016-02-17: 100 mL via INTRAVENOUS

## 2016-02-18 ENCOUNTER — Telehealth: Payer: Self-pay | Admitting: Pulmonary Disease

## 2016-02-18 ENCOUNTER — Ambulatory Visit: Payer: BLUE CROSS/BLUE SHIELD

## 2016-02-18 NOTE — Telephone Encounter (Signed)
Patient requesting Dr. Corrie Dandy review her CT scan and advise what needs to be done for further diagnosis of nodule found on CT. Patient is aware that Dr. Corrie Dandy is working night shift at the hospital and it may take him a little while to review the CT, she is okay with waiting until he can review CT.  Dr. Corrie Dandy, please advise.

## 2016-02-18 NOTE — Telephone Encounter (Signed)
I looked at her abd ct scan > she has a tiny 3 mm nodule. Maybe scar tissue from a previous infxn.  Nothing worrisome at this point. Plan to rpt chest ct scan in 1 year.   AD

## 2016-02-18 NOTE — Telephone Encounter (Signed)
Patient notified of Dr. Murlean Iba recommendations. Sent Reminder to [facility] for CT Scan x 1 year. Nothing further needed.

## 2016-02-20 ENCOUNTER — Ambulatory Visit (HOSPITAL_COMMUNITY)
Admission: RE | Admit: 2016-02-20 | Discharge: 2016-02-20 | Disposition: A | Discharge status: Home / Self Care | Payer: BLUE CROSS/BLUE SHIELD | Source: Ambulatory Visit | Attending: Pulmonary Disease | Admitting: Pulmonary Disease

## 2016-02-20 DIAGNOSIS — Z87891 Personal history of nicotine dependence: Secondary | ICD-10-CM | POA: Diagnosis not present

## 2016-02-20 DIAGNOSIS — J449 Chronic obstructive pulmonary disease, unspecified: Secondary | ICD-10-CM | POA: Insufficient documentation

## 2016-02-20 LAB — PULMONARY FUNCTION TEST
DL/VA % pred: 118 %
DL/VA: 5.01 ml/min/mmHg/L
DLCO COR % PRED: 70 %
DLCO COR: 13.35 ml/min/mmHg
DLCO UNC % PRED: 67 %
DLCO UNC: 12.74 ml/min/mmHg
FEF 25-75 POST: 1.81 L/s
FEF 25-75 PRE: 1.33 L/s
FEF2575-%Change-Post: 36 %
FEF2575-%PRED-POST: 83 %
FEF2575-%Pred-Pre: 61 %
FEV1-%Change-Post: 4 %
FEV1-%PRED-POST: 74 %
FEV1-%Pred-Pre: 71 %
FEV1-Post: 1.48 L
FEV1-Pre: 1.42 L
FEV1FVC-%Change-Post: 9 %
FEV1FVC-%PRED-PRE: 100 %
FEV6-%Change-Post: -4 %
FEV6-%PRED-POST: 70 %
FEV6-%Pred-Pre: 73 %
FEV6-POST: 1.67 L
FEV6-Pre: 1.75 L
FEV6FVC-%PRED-POST: 103 %
FEV6FVC-%Pred-Pre: 103 %
FVC-%Change-Post: -4 %
FVC-%PRED-PRE: 71 %
FVC-%Pred-Post: 67 %
FVC-POST: 1.67 L
FVC-PRE: 1.75 L
PRE FEV1/FVC RATIO: 81 %
Post FEV1/FVC ratio: 89 %
Post FEV6/FVC ratio: 100 %
Pre FEV6/FVC Ratio: 100 %
RV % pred: 97 %
RV: 1.55 L
TLC % pred: 77 %
TLC: 3.45 L

## 2016-02-20 MED ORDER — ALBUTEROL SULFATE (2.5 MG/3ML) 0.083% IN NEBU
2.5000 mg | INHALATION_SOLUTION | Freq: Once | RESPIRATORY_TRACT | Status: AC
  Administered 2016-02-20: 2.5 mg via RESPIRATORY_TRACT

## 2016-02-21 ENCOUNTER — Ambulatory Visit (HOSPITAL_COMMUNITY)
Admission: EM | Admit: 2016-02-21 | Discharge: 2016-02-21 | Disposition: A | Discharge status: Home / Self Care | Payer: BLUE CROSS/BLUE SHIELD | Attending: Emergency Medicine | Admitting: Emergency Medicine

## 2016-02-21 ENCOUNTER — Encounter (HOSPITAL_COMMUNITY): Payer: Self-pay

## 2016-02-21 DIAGNOSIS — G43911 Migraine, unspecified, intractable, with status migrainosus: Secondary | ICD-10-CM | POA: Diagnosis not present

## 2016-02-21 DIAGNOSIS — M25569 Pain in unspecified knee: Secondary | ICD-10-CM | POA: Diagnosis not present

## 2016-02-21 DIAGNOSIS — M25561 Pain in right knee: Secondary | ICD-10-CM

## 2016-02-21 DIAGNOSIS — G8929 Other chronic pain: Secondary | ICD-10-CM

## 2016-02-21 MED ORDER — DIPHENHYDRAMINE HCL 50 MG/ML IJ SOLN
INTRAMUSCULAR | Status: AC
  Filled 2016-02-21: qty 1

## 2016-02-21 MED ORDER — KETOROLAC TROMETHAMINE 60 MG/2ML IM SOLN
60.0000 mg | Freq: Once | INTRAMUSCULAR | Status: AC
  Administered 2016-02-21: 60 mg via INTRAMUSCULAR

## 2016-02-21 MED ORDER — DIPHENHYDRAMINE HCL 50 MG/ML IJ SOLN: 25.0000 mg | Freq: Once | INTRAMUSCULAR | Status: DC

## 2016-02-21 MED ORDER — METOCLOPRAMIDE HCL 5 MG/ML IJ SOLN
INTRAMUSCULAR | Status: AC
  Filled 2016-02-21: qty 2

## 2016-02-21 MED ORDER — METOCLOPRAMIDE HCL 5 MG/ML IJ SOLN
5.0000 mg | Freq: Once | INTRAMUSCULAR | Status: AC
  Administered 2016-02-21: 5 mg via INTRAMUSCULAR

## 2016-02-21 MED ORDER — HYDROCODONE-ACETAMINOPHEN 5-325 MG PO TABS: 1.0000 | ORAL_TABLET | Freq: Four times a day (QID) | ORAL | Status: DC | PRN

## 2016-02-21 MED ORDER — GABAPENTIN 300 MG PO CAPS: 600.0000 mg | ORAL_CAPSULE | Freq: Three times a day (TID) | ORAL | Status: DC

## 2016-02-21 MED ORDER — KETOROLAC TROMETHAMINE 60 MG/2ML IM SOLN
INTRAMUSCULAR | Status: AC
  Filled 2016-02-21: qty 2

## 2016-02-21 MED ORDER — DEXAMETHASONE SODIUM PHOSPHATE 10 MG/ML IJ SOLN
INTRAMUSCULAR | Status: AC
  Filled 2016-02-21: qty 1

## 2016-02-21 MED ORDER — DEXAMETHASONE SODIUM PHOSPHATE 10 MG/ML IJ SOLN
10.0000 mg | Freq: Once | INTRAMUSCULAR | Status: AC
  Administered 2016-02-21: 10 mg via INTRAMUSCULAR

## 2016-02-21 MED ORDER — TIZANIDINE HCL 4 MG PO TABS: 4.0000 mg | ORAL_TABLET | Freq: Four times a day (QID) | ORAL | Status: DC | PRN

## 2016-02-21 MED ORDER — DIPHENHYDRAMINE HCL 50 MG/ML IJ SOLN
50.0000 mg | Freq: Once | INTRAMUSCULAR | Status: AC
  Administered 2016-02-21: 50 mg via INTRAMUSCULAR

## 2016-02-21 NOTE — ED Notes (Signed)
Patient presents with Bilateral knee pain x4 days pain has worsen, pt states she is out of medication and does not currently have a primary physician and would like a refill on medications  No acute distress

## 2016-02-21 NOTE — ED Provider Notes (Signed)
CSN: [id number]     Arrival date & time 02/21/16  1510 History   First MD Initiated Contact with Patient 02/21/16 1547     Chief Complaint  Patient presents with  . Knee Pain   HPI Pt is a 50 y.o. morbidly obese female with history of chronic knee pain, PVD, OSA who presents with 4 days of worsening knee pain and 2 weeks of headache.  Pt reports that her knee pain has been ongoing for many years and has failed multiple treatments including steroid and gel injections, nsaids, steroids, PT, etc. She has been told she needs knee replacement but needs to lose weight loss first and is working towards bariatric surgery to help with this. She currently takes gabapentin, flexeril and tylenol but they do not help significantly with her pain. For the past 4 days her pain has gotten worse. She denies ever being on narcotics for her knee pain. Her orthopedist at [facility] has referred her to pain management and her first appointment is in June. Knees occasionally give out.  She reports a frontal headache for the past two weeks. It is bilateral and achy. It is sometimes sensitive to light and other times not. She has been nauseated and mildly lightheaded with it. She reports that headaches of any type are unusual for her. She was seen at [facility] long for this last week and given zofran and benadryl but they did not help. She has taken tylenol at home which didn't help. She denies any changes in her vision, weakness or numbness.  Past Medical History  Diagnosis Date  . Pneumothorax   . Arthritis   . Numbness     left leg  . Hyperplastic colon polyp   . Diverticulosis   . Obesity   . Seizures (HCC)     hx of, no seizures last few years  . Schatzki's ring    Past Surgical History  Procedure Laterality Date  . Chest tube insertion    . Tubal ligation    . Colonoscopy with propofol N/A 11/01/2014    Procedure: COLONOSCOPY WITH PROPOFOL;  Surgeon: Jerene Bears, MD;  Location: [facility];  Service:  Gastroenterology;  Laterality: N/A;  . Esophagogastroduodenoscopy (egd) with propofol N/A 07/29/2015    Procedure: ESOPHAGOGASTRODUODENOSCOPY (EGD) WITH PROPOFOL;  Surgeon: Jerene Bears, MD;  Location: [facility];  Service: Gastroenterology;  Laterality: N/A;  . Peripheral vascular catheterization N/A 01/07/2016    Procedure: Lower Extremity Angiography;  Surgeon: Adrian Prows, MD;  Location: [place] [facility];  Service: Cardiovascular;  Laterality: N/A;   Family History  Problem Relation Age of Onset  . Heart failure Father   . Diabetes Maternal Grandmother   . Diabetes Paternal Grandfather   . Diabetes Father   . Cancer Maternal Grandfather     unknown type   Social History  Substance Use Topics  . Smoking status: Former Smoker -- 1.50 packs/day for 0 years    Types: Cigarettes    Quit date: 05/04/2009  . Smokeless tobacco: Never Used  . Alcohol Use: Yes     Comment: occ   OB History    No data available     Review of Systems See HPI  Allergies  Review of patient's allergies indicates no known allergies.  Home Medications   Prior to Admission medications   Medication Sig Start Date End Date Taking? Authorizing Provider  amitriptyline (ELAVIL) 75 MG tablet Take 75 mg by mouth at bedtime.   Yes Historical Provider, MD  aspirin 81 MG chewable tablet Chew 81 mg by mouth daily.   Yes Historical Provider, MD  atorvastatin (LIPITOR) 20 MG tablet Take 20 mg by mouth daily.   Yes Historical Provider, MD  budesonide-formoterol (SYMBICORT) 160-4.5 MCG/ACT inhaler Inhale 2 puffs into the lungs 2 (two) times daily. 02/07/16  Yes Jose Shirl Harris, MD  ferrous sulfate 325 (65 FE) MG tablet Take 2 tablets by mouth every other day.   Yes Historical Provider, MD  furosemide (LASIX) 40 MG tablet Take 1 tablet (40 mg total) by mouth daily as needed for edema. 12/12/15  Yes Iline Oven, MD  loperamide (IMODIUM A-D) 2 MG tablet Take 1 tablet (2 mg total) by mouth 3 (three) times  daily. 02/11/16  Yes Jerene Bears, MD  cyclobenzaprine (FLEXERIL) 10 MG tablet Take 1 tablet by mouth 3 (three) times daily.    Historical Provider, MD  gabapentin (NEURONTIN) 300 MG capsule Take 2-3 capsules (600-900 mg total) by mouth 3 (three) times daily. 600mg  in the morning, 600mg  in the afternoon.  900mg  in the evening. 02/21/16   Frazier Richards, MD  HYDROcodone-acetaminophen (NORCO/VICODIN) 5-325 MG tablet Take 1 tablet by mouth every 6 (six) hours as needed for moderate pain. 02/21/16   Frazier Richards, MD  pantoprazole (PROTONIX) 20 MG tablet Take 20 mg by mouth daily.    Historical Provider, MD  tiZANidine (ZANAFLEX) 4 MG tablet Take 1-2 tablets (4-8 mg total) by mouth every 6 (six) hours as needed for muscle spasms. 02/21/16   Frazier Richards, MD   Meds Ordered and Administered this Visit   Medications  ketorolac (TORADOL) injection 60 mg (not administered)  metoCLOPramide (REGLAN) injection 5 mg (not administered)  dexamethasone (DECADRON) injection 10 mg (not administered)  diphenhydrAMINE (BENADRYL) injection 25 mg (not administered)    BP 114/80 mmHg  Pulse 79  Temp(Src) 98.8 F (37.1 C) (Oral)  Resp 16  SpO2 96%  LMP 02/04/2016 (Exact Date) No data found.   Physical Exam  Constitutional: She is oriented to person, place, and time. She appears well-developed and well-nourished. No distress.  HENT:  Head: Normocephalic and atraumatic.  Eyes: Conjunctivae are normal. Pupils are equal, round, and reactive to light. Right eye exhibits no discharge. Left eye exhibits no discharge. No scleral icterus.  Neck: Normal range of motion. Neck supple.  Cardiovascular: Normal rate, regular rhythm, normal heart sounds and intact distal pulses.   No murmur heard. Pulmonary/Chest: Effort normal and breath sounds normal. No respiratory distress. She has no wheezes.  Abdominal: Soft. Bowel sounds are normal. She exhibits no distension. There is no tenderness.  Musculoskeletal:       Right  knee: She exhibits decreased range of motion and swelling. She exhibits no ecchymosis, no deformity, no laceration, no erythema, normal alignment, no LCL laxity, normal patellar mobility, no bony tenderness, normal meniscus and no MCL laxity. No tenderness found.       Left knee: She exhibits decreased range of motion and swelling. She exhibits no ecchymosis, no deformity, no laceration, no erythema, normal alignment, no LCL laxity, no bony tenderness, normal meniscus and no MCL laxity. No tenderness found.  Bilateral knee pain worse with knee flexion and weight bearing, no focal tenderness or effusion  Neurological: She is alert and oriented to person, place, and time. She has normal strength. No cranial nerve deficit or sensory deficit. Gait normal.  Skin: Skin is warm and dry. No rash noted. She is not diaphoretic. No pallor.  Psychiatric: She has a normal mood and affect. Her behavior is normal.  Nursing note and vitals reviewed.   ED Course  Procedures (including critical care time)  Labs Review Labs Reviewed - No data to display  Imaging Review No results found.    MDM   1. Chronic knee pain, unspecified laterality   2. Intractable migraine with status migrainosus, unspecified migraine type   3. Chronic knee pain, right    Acute flair of chronic knee pain. Will switch to xanaflex and refill gabapentin. Vicodin for acute pain but discussed this is not a good long-term option. Recommend f/u with ortho and pain management as directed.  Migraine. Will give cocktail of decadron, benadryl, toradol and reglan.  Frazier Richards, MD 02/21/16 [phone]

## 2016-02-21 NOTE — Discharge Instructions (Signed)
Make sure you keep your pain management appointments and continue to follow-up as directed with your surgeons as weight loss and knee replacements will be necessary for long term pain control.
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
## 2016-02-25 ENCOUNTER — Encounter (HOSPITAL_COMMUNITY)
Admission: RE | Disposition: A | Discharge status: Home / Self Care | Payer: Self-pay | Source: Ambulatory Visit | Attending: Cardiology

## 2016-02-25 ENCOUNTER — Ambulatory Visit (HOSPITAL_COMMUNITY)
Admission: RE | Admit: 2016-02-25 | Discharge: 2016-02-25 | Disposition: A | Discharge status: Home / Self Care | Payer: BLUE CROSS/BLUE SHIELD | Source: Ambulatory Visit | Attending: Cardiology | Admitting: Cardiology

## 2016-02-25 DIAGNOSIS — Z7982 Long term (current) use of aspirin: Secondary | ICD-10-CM | POA: Insufficient documentation

## 2016-02-25 DIAGNOSIS — K219 Gastro-esophageal reflux disease without esophagitis: Secondary | ICD-10-CM | POA: Insufficient documentation

## 2016-02-25 DIAGNOSIS — I70211 Atherosclerosis of native arteries of extremities with intermittent claudication, right leg: Secondary | ICD-10-CM | POA: Insufficient documentation

## 2016-02-25 DIAGNOSIS — G629 Polyneuropathy, unspecified: Secondary | ICD-10-CM | POA: Insufficient documentation

## 2016-02-25 DIAGNOSIS — Z8249 Family history of ischemic heart disease and other diseases of the circulatory system: Secondary | ICD-10-CM | POA: Insufficient documentation

## 2016-02-25 DIAGNOSIS — Z6841 Body Mass Index (BMI) 40.0 and over, adult: Secondary | ICD-10-CM | POA: Insufficient documentation

## 2016-02-25 DIAGNOSIS — I7092 Chronic total occlusion of artery of the extremities: Secondary | ICD-10-CM | POA: Insufficient documentation

## 2016-02-25 DIAGNOSIS — I081 Rheumatic disorders of both mitral and tricuspid valves: Secondary | ICD-10-CM | POA: Diagnosis not present

## 2016-02-25 DIAGNOSIS — R739 Hyperglycemia, unspecified: Secondary | ICD-10-CM | POA: Insufficient documentation

## 2016-02-25 DIAGNOSIS — G8929 Other chronic pain: Secondary | ICD-10-CM | POA: Diagnosis not present

## 2016-02-25 DIAGNOSIS — R0789 Other chest pain: Secondary | ICD-10-CM | POA: Insufficient documentation

## 2016-02-25 DIAGNOSIS — R0989 Other specified symptoms and signs involving the circulatory and respiratory systems: Secondary | ICD-10-CM | POA: Diagnosis not present

## 2016-02-25 DIAGNOSIS — I739 Peripheral vascular disease, unspecified: Secondary | ICD-10-CM | POA: Diagnosis present

## 2016-02-25 HISTORY — PX: PERIPHERAL VASCULAR CATHETERIZATION: SHX172C

## 2016-02-25 LAB — POCT ACTIVATED CLOTTING TIME
ACTIVATED CLOTTING TIME: 219 s
ACTIVATED CLOTTING TIME: 245 s
Activated Clotting Time: 214 seconds

## 2016-02-25 SURGERY — PERIPHERAL VASCULAR ATHERECTOMY
Laterality: Right

## 2016-02-25 MED ORDER — SODIUM CHLORIDE 0.9 % IV SOLN: 250.0000 mL | INTRAVENOUS | Status: DC | PRN

## 2016-02-25 MED ORDER — SODIUM CHLORIDE 0.9 % IV BOLUS (SEPSIS)
500.0000 mL | Freq: Once | INTRAVENOUS | Status: AC
  Administered 2016-02-25: 500 mL via INTRAVENOUS

## 2016-02-25 MED ORDER — HEPARIN SODIUM (PORCINE) 1000 UNIT/ML IJ SOLN
INTRAMUSCULAR | Status: AC
  Filled 2016-02-25: qty 1

## 2016-02-25 MED ORDER — HYDROMORPHONE HCL 1 MG/ML IJ SOLN
INTRAMUSCULAR | Status: AC
  Filled 2016-02-25: qty 1

## 2016-02-25 MED ORDER — MIDAZOLAM HCL 2 MG/2ML IJ SOLN
INTRAMUSCULAR | Status: AC
  Filled 2016-02-25: qty 2

## 2016-02-25 MED ORDER — NITROGLYCERIN 1 MG/10 ML FOR IR/CATH LAB
INTRA_ARTERIAL | Status: AC
  Filled 2016-02-25: qty 10

## 2016-02-25 MED ORDER — CEFAZOLIN SODIUM-DEXTROSE 2-4 GM/100ML-% IV SOLN
INTRAVENOUS | Status: AC
  Administered 2016-02-25: 2 g via INTRAVENOUS
  Filled 2016-02-25: qty 100

## 2016-02-25 MED ORDER — ONDANSETRON HCL 4 MG/2ML IJ SOLN
INTRAMUSCULAR | Status: AC
  Administered 2016-02-25: 4 mg via INTRAVENOUS
  Filled 2016-02-25: qty 2

## 2016-02-25 MED ORDER — ONDANSETRON HCL 4 MG/2ML IJ SOLN
4.0000 mg | Freq: Once | INTRAMUSCULAR | Status: AC
Start: 2016-02-25 — End: 2016-02-25
  Administered 2016-02-25: 4 mg via INTRAVENOUS

## 2016-02-25 MED ORDER — LIDOCAINE HCL (PF) 1 % IJ SOLN
INTRAMUSCULAR | Status: AC
  Filled 2016-02-25: qty 30

## 2016-02-25 MED ORDER — HEPARIN (PORCINE) IN NACL 2-0.9 UNIT/ML-% IJ SOLN
INTRAMUSCULAR | Status: DC | PRN
  Administered 2016-02-25: 1000 mL

## 2016-02-25 MED ORDER — SODIUM CHLORIDE 0.9 % IV SOLN
1.0000 mL/kg/h | INTRAVENOUS | Status: DC
  Administered 2016-02-25: 1 mL/kg/h via INTRAVENOUS

## 2016-02-25 MED ORDER — PRASUGREL HCL 10 MG PO TABS
ORAL_TABLET | ORAL | Status: DC | PRN
  Administered 2016-02-25: 60 mg via ORAL

## 2016-02-25 MED ORDER — PRASUGREL HCL 10 MG PO TABS
ORAL_TABLET | ORAL | Status: AC
  Filled 2016-02-25: qty 6

## 2016-02-25 MED ORDER — HEPARIN (PORCINE) IN NACL 2-0.9 UNIT/ML-% IJ SOLN
INTRAMUSCULAR | Status: AC
  Filled 2016-02-25: qty 1000

## 2016-02-25 MED ORDER — LIDOCAINE HCL (PF) 1 % IJ SOLN
INTRAMUSCULAR | Status: DC | PRN
  Administered 2016-02-25: 20 mL
  Administered 2016-02-25: 10 mL

## 2016-02-25 MED ORDER — CLOPIDOGREL BISULFATE 75 MG PO TABS: 75.0000 mg | ORAL_TABLET | Freq: Every day | ORAL | Status: DC

## 2016-02-25 MED ORDER — SODIUM CHLORIDE 0.9% FLUSH: 3.0000 mL | INTRAVENOUS | Status: DC | PRN

## 2016-02-25 MED ORDER — SODIUM CHLORIDE 0.9 % IV SOLN: INTRAVENOUS | Status: DC

## 2016-02-25 MED ORDER — HYDROMORPHONE HCL 1 MG/ML IJ SOLN
INTRAMUSCULAR | Status: DC | PRN
  Administered 2016-02-25 (×5): 0.5 mg via INTRAVENOUS

## 2016-02-25 MED ORDER — IODIXANOL 320 MG/ML IV SOLN
INTRAVENOUS | Status: DC | PRN
  Administered 2016-02-25: 142 mL via INTRAVENOUS

## 2016-02-25 MED ORDER — HEPARIN SODIUM (PORCINE) 1000 UNIT/ML IJ SOLN
INTRAMUSCULAR | Status: DC | PRN
  Administered 2016-02-25 (×2): 3000 [IU] via INTRAVENOUS
  Administered 2016-02-25: 7000 [IU] via INTRAVENOUS

## 2016-02-25 MED ORDER — MIDAZOLAM HCL 2 MG/2ML IJ SOLN
INTRAMUSCULAR | Status: DC | PRN
  Administered 2016-02-25 (×2): 1 mg via INTRAVENOUS
  Administered 2016-02-25: 2 mg via INTRAVENOUS

## 2016-02-25 MED ORDER — NITROGLYCERIN 1 MG/10 ML FOR IR/CATH LAB
INTRA_ARTERIAL | Status: DC | PRN
  Administered 2016-02-25: 700 ug
  Administered 2016-02-25: 1000 mL

## 2016-02-25 MED ORDER — SODIUM CHLORIDE 0.9% FLUSH: 3.0000 mL | Freq: Two times a day (BID) | INTRAVENOUS | Status: DC

## 2016-02-25 MED ORDER — CEFAZOLIN SODIUM-DEXTROSE 2-4 GM/100ML-% IV SOLN
2.0000 g | Freq: Once | INTRAVENOUS | Status: AC
  Administered 2016-02-25: 2 g via INTRAVENOUS

## 2016-02-25 SURGICAL SUPPLY — 29 items
BALLN LUTONIX 5X150X130 (BALLOONS) ×4
BALLOON LUTONIX 5X150X130 (BALLOONS) ×2 IMPLANT
CATH CXI SUPP ANG 4FR 135 (MICROCATHETER) ×2 IMPLANT
CATH CXI SUPP ANG 4FR 135CM (MICROCATHETER) ×4
CATH OMNI FLUSH 5F 65CM (CATHETERS) ×4 IMPLANT
CATH TURBOHAWK XTEND LX-C (CATHETERS) ×4 IMPLANT
COVER PRB 48X5XTLSCP FOLD TPE (BAG) ×2 IMPLANT
COVER PROBE 5X48 (BAG) ×2
DEVICE CLOSURE PERCLS PRGLD 6F (VASCULAR PRODUCTS) ×2 IMPLANT
DEVICE EMBOSHIELD NAV6 4.0-7.0 (WIRE) ×8 IMPLANT
HOVERMATT SINGLE USE (MISCELLANEOUS) ×4 IMPLANT
KIT ENCORE 26 ADVANTAGE (KITS) ×4 IMPLANT
KIT ESSENTIALS PG (KITS) ×4 IMPLANT
KIT MICROINTRODUCER STIFF 5F (SHEATH) ×4 IMPLANT
KIT PV (KITS) ×4 IMPLANT
PERCLOSE PROGLIDE 6F (VASCULAR PRODUCTS) ×4
SHEATH FLEX ANSEL ANG 5F 45CM (SHEATH) ×4 IMPLANT
SHEATH FLEXOR ANSEL 1 7F 45CM (SHEATH) ×4 IMPLANT
SHEATH PINNACLE 5F 10CM (SHEATH) ×4 IMPLANT
TAPE RADIOPAQUE TURBO (MISCELLANEOUS) ×4 IMPLANT
TRANSDUCER W/STOPCOCK (MISCELLANEOUS) ×4 IMPLANT
TRAY PV CATH (CUSTOM PROCEDURE TRAY) ×4 IMPLANT
WIRE APROACH 18G .014X300CM (WIRE) ×4 IMPLANT
WIRE HI TORQ VERSACORE J 260CM (WIRE) ×4 IMPLANT
WIRE HI TORQ WHISPER MS 300CM (WIRE) ×4 IMPLANT
WIRE HITORQ VERSACORE ST 145CM (WIRE) ×4 IMPLANT
WIRE ROSEN-J .035X180CM (WIRE) ×4 IMPLANT
WIRE TORQFLEX AUST .018X40CM (WIRE) ×8 IMPLANT
WIRE VIPER ADVANCE .017X335CM (WIRE) ×4 IMPLANT

## 2016-02-25 NOTE — Discharge Instructions (Signed)

## 2016-02-25 NOTE — Progress Notes (Signed)
No further nausea or vomiting/ no bleeding.VSS/ pt discharged to home

## 2016-02-25 NOTE — Progress Notes (Signed)
Pt complained of being very hot. Pt vomited small amt of sandwich.  Dr. Einar Gip notified . Order received for Zofran which was given.

## 2016-02-26 ENCOUNTER — Encounter (HOSPITAL_COMMUNITY): Payer: Self-pay | Admitting: Cardiology

## 2016-02-27 ENCOUNTER — Ambulatory Visit
Admission: RE | Admit: 2016-02-27 | Discharge: 2016-02-27 | Disposition: A | Discharge status: Home / Self Care | Payer: BLUE CROSS/BLUE SHIELD | Source: Ambulatory Visit

## 2016-02-27 DIAGNOSIS — Z1231 Encounter for screening mammogram for malignant neoplasm of breast: Secondary | ICD-10-CM

## 2016-03-05 ENCOUNTER — Telehealth: Payer: Self-pay | Admitting: Internal Medicine

## 2016-03-05 NOTE — Telephone Encounter (Signed)
Pt states she was supposed to call back with an update regarding the imodium. States the imodium is not working. Please advise.

## 2016-03-08 ENCOUNTER — Encounter (HOSPITAL_COMMUNITY): Payer: Self-pay | Admitting: *Deleted

## 2016-03-08 ENCOUNTER — Ambulatory Visit (HOSPITAL_COMMUNITY)
Admission: EM | Admit: 2016-03-08 | Discharge: 2016-03-08 | Disposition: A | Discharge status: Home / Self Care | Payer: BLUE CROSS/BLUE SHIELD | Attending: Emergency Medicine | Admitting: Emergency Medicine

## 2016-03-08 DIAGNOSIS — R609 Edema, unspecified: Secondary | ICD-10-CM

## 2016-03-08 HISTORY — DX: Other chronic pain: G89.29

## 2016-03-08 NOTE — ED Notes (Signed)
Pt reports having peripheral vascular arthrectomy performed 5/30 by Dr. Einar Gip.  Ever since procedure, has had BLE edema.  Pt states she had f/u appt with Dr Einar Gip 3 days ago - was told "it will go down".  Pt states swelling is getting worse and very uncomfortable.  Non-pitting edema noted.

## 2016-03-08 NOTE — ED Provider Notes (Signed)
CSN: [id number]     Arrival date & time 03/08/16  1658 History   First MD Initiated Contact with Patient 03/08/16 1712     Chief Complaint  Patient presents with  . Leg Swelling   (Consider location/radiation/quality/duration/timing/severity/associated sxs/prior Treatment) HPI History obtained from patient:  Pt presents with the cc of:  Bilateral leg swelling Duration of symptoms: ongoing for quite some time Treatment prior to arrival: takes furosemide occasionally Context: has history of leg swelling, but not as tight and painful Other symptoms include: painful swelling Pain score: 3 FAMILY HISTORY: DM-father    Past Medical History  Diagnosis Date  . Pneumothorax   . Arthritis   . Numbness     left leg  . Hyperplastic colon polyp   . Diverticulosis   . Obesity   . Seizures (HCC)     hx of, no seizures last few years  . Schatzki's ring   . Chronic pain    Past Surgical History  Procedure Laterality Date  . Chest tube insertion    . Tubal ligation    . Colonoscopy with propofol N/A 11/01/2014    Procedure: COLONOSCOPY WITH PROPOFOL;  Surgeon: Jerene Bears, MD;  Location: [facility];  Service: Gastroenterology;  Laterality: N/A;  . Esophagogastroduodenoscopy (egd) with propofol N/A 07/29/2015    Procedure: ESOPHAGOGASTRODUODENOSCOPY (EGD) WITH PROPOFOL;  Surgeon: Jerene Bears, MD;  Location: [facility];  Service: Gastroenterology;  Laterality: N/A;  . Peripheral vascular catheterization N/A 01/07/2016    Procedure: Lower Extremity Angiography;  Surgeon: Adrian Prows, MD;  Location: [facility];  Service: Cardiovascular;  Laterality: N/A;  . Peripheral vascular catheterization Right 02/25/2016    Procedure: Peripheral Vascular Atherectomy;  Surgeon: Adrian Prows, MD;  Location: [facility];  Service: Cardiovascular;  Laterality: Right;  SFA ATHERECTOMY/DRUG COATED PTA   Family History  Problem Relation Age of Onset  . Heart failure Father   . Diabetes Maternal  Grandmother   . Diabetes Paternal Grandfather   . Diabetes Father   . Cancer Maternal Grandfather     unknown type   Social History  Substance Use Topics  . Smoking status: Former Smoker -- 1.50 packs/day for 0 years    Types: Cigarettes    Quit date: 05/04/2009  . Smokeless tobacco: Never Used  . Alcohol Use: Yes     Comment: occasionally   OB History    No data available     Review of Systems  Denies: HEADACHE, NAUSEA, ABDOMINAL PAIN, CHEST PAIN, CONGESTION, DYSURIA, SHORTNESS OF BREATH  Allergies  Review of patient's allergies indicates no known allergies.  Home Medications   Prior to Admission medications   Medication Sig Start Date End Date Taking? Authorizing Provider  amitriptyline (ELAVIL) 75 MG tablet Take 75 mg by mouth at bedtime.   Yes Historical Provider, MD  aspirin 81 MG chewable tablet Chew 81 mg by mouth daily.   Yes Historical Provider, MD  budesonide-formoterol (SYMBICORT) 160-4.5 MCG/ACT inhaler Inhale 2 puffs into the lungs 2 (two) times daily. 02/07/16  Yes Jose Shirl Harris, MD  ferrous sulfate 325 (65 FE) MG tablet Take 2 tablets by mouth every other day.   Yes Historical Provider, MD  furosemide (LASIX) 40 MG tablet Take 1 tablet (40 mg total) by mouth daily as needed for edema. 12/12/15  Yes Iline Oven, MD  gabapentin (NEURONTIN) 300 MG capsule Take 2-3 capsules (600-900 mg total) by mouth 3 (three) times daily. 600mg  in the morning,  600mg  in the afternoon.  900mg  in the evening. 02/21/16  Yes Frazier Richards, MD  HYDROcodone-acetaminophen (NORCO/VICODIN) 5-325 MG tablet Take 1 tablet by mouth every 6 (six) hours as needed for moderate pain. 02/21/16  Yes Frazier Richards, MD  loperamide (IMODIUM A-D) 2 MG tablet Take 1 tablet (2 mg total) by mouth 3 (three) times daily. 02/11/16  Yes Jerene Bears, MD  Methocarbamol (ROBAXIN PO) Take by mouth.   Yes Historical Provider, MD  atorvastatin (LIPITOR) 20 MG tablet Take 20 mg by mouth daily.    Historical  Provider, MD  clopidogrel (PLAVIX) 75 MG tablet Take 1 tablet (75 mg total) by mouth daily. 02/25/16   Adrian Prows, MD  cyclobenzaprine (FLEXERIL) 10 MG tablet Take 1 tablet by mouth 3 (three) times daily.    Historical Provider, MD  pantoprazole (PROTONIX) 20 MG tablet Take 20 mg by mouth daily.    Historical Provider, MD  tiZANidine (ZANAFLEX) 4 MG tablet Take 1-2 tablets (4-8 mg total) by mouth every 6 (six) hours as needed for muscle spasms. 02/21/16   Frazier Richards, MD   Meds Ordered and Administered this Visit  Medications - No data to display  BP 136/79 mmHg  Pulse 82  Temp(Src) 98 F (36.7 C) (Oral)  Resp 16  SpO2 99%  LMP 02/23/2016 (Approximate) No data found.   Physical Exam NURSES NOTES AND VITAL SIGNS REVIEWED. CONSTITUTIONAL: Well developed, well nourished, no acute distress HEENT: normocephalic, atraumatic EYES: Conjunctiva normal NECK:normal ROM, supple, no adenopathy CVS: No murmur or gallops.  PULMONARY:No respiratory distress, normal effort, no crackles. ABDOMINAL: Soft, ND, NT BS+, No CVAT MUSCULOSKELETAL: Normal ROM of all extremities, pitting edema both lower extremities up to the knee. Skin is taunt and shiny. No leaking.  SKIN: warm and dry without rash PSYCHIATRIC: Mood and affect, behavior are normal   ED Course  Procedures (including critical care time)  Labs Review Labs Reviewed - No data to display  Imaging Review No results found.   Visual Acuity Review  Right Eye Distance:   Left Eye Distance:   Bilateral Distance:    Right Eye Near:   Left Eye Near:    Bilateral Near:       Unsure if patient will make complete recovery, but no acute signs or symptoms that are worrying at this time.  Advised to use furosemide daily, and to keep legs elevated as much as possible.  Return to work in 3 days.   MDM   1. Dependent edema     Patient is reassured that there are no issues that require transfer to higher level of care at this time or  additional tests. Patient is advised to continue home symptomatic treatment. Patient is advised that if there are new or worsening symptoms to attend the emergency department, contact primary care provider, or return to UC. Instructions of care provided discharged home in stable condition.    THIS NOTE WAS GENERATED USING A VOICE RECOGNITION SOFTWARE PROGRAM. ALL REASONABLE EFFORTS  WERE MADE TO PROOFREAD THIS DOCUMENT FOR ACCURACY.  I have verbally reviewed the discharge instructions with the patient. A printed AVS was given to the patient.  All questions were answered prior to discharge.      Konrad Felix, [place] 03/08/16 1758

## 2016-03-08 NOTE — Discharge Instructions (Signed)

## 2016-03-09 ENCOUNTER — Other Ambulatory Visit: Payer: Self-pay

## 2016-03-09 MED ORDER — COLESEVELAM HCL 625 MG PO TABS: 1875.0000 mg | ORAL_TABLET | Freq: Two times a day (BID) | ORAL | Status: DC

## 2016-03-09 NOTE — Telephone Encounter (Signed)
Spoke with pt and she is aware, script sent to pharmacy. 

## 2016-03-09 NOTE — Telephone Encounter (Signed)
Loperamide not helping for loose stools WelChol 1.875 g twice a day Can still use loperamide 2-4 mg TIDPRN

## 2016-03-10 ENCOUNTER — Ambulatory Visit: Payer: BLUE CROSS/BLUE SHIELD | Admitting: Dietician

## 2016-03-13 ENCOUNTER — Encounter: Payer: BLUE CROSS/BLUE SHIELD | Admitting: Internal Medicine

## 2016-03-13 ENCOUNTER — Telehealth: Payer: Self-pay | Admitting: Internal Medicine

## 2016-03-13 NOTE — Telephone Encounter (Signed)
Additional suggestions? Welchol is not covered for patient.

## 2016-03-16 MED ORDER — COLESTIPOL HCL 1 G PO TABS: 2.0000 g | ORAL_TABLET | Freq: Two times a day (BID) | ORAL | Status: DC

## 2016-03-16 NOTE — Telephone Encounter (Signed)
New rx. Patient advised.

## 2016-03-16 NOTE — Telephone Encounter (Signed)
colesipol 2 g BID

## 2016-03-25 ENCOUNTER — Encounter: Payer: BLUE CROSS/BLUE SHIELD | Admitting: Internal Medicine

## 2016-03-26 ENCOUNTER — Encounter: Payer: Self-pay | Admitting: Dietician

## 2016-03-26 ENCOUNTER — Encounter: Payer: BLUE CROSS/BLUE SHIELD | Attending: General Surgery | Admitting: Dietician

## 2016-03-26 NOTE — Patient Instructions (Addendum)
Follow Pre-Op Goals Try Protein Shakes Call [employer] to ask if you have met the supervised weight loss requirement.  Call [facility] at [phone] when surgery is scheduled to enroll in Pre-Op Class  Things to remember:  Please always be honest with [place]. We want to support you!  If you have any questions or concerns in between appointments, please call or email Ferol Luz, or Margarita Grizzle.  The diet after surgery will be high protein and low in carbohydrate.  Vitamins and calcium need to be taken for the rest of your life.  Feel free to include support people in any classes or appointments.   Supplement recommendations:  Complete" Multivitamin: Sleeve Gastrectomy and RYGB patients take a double dose of MVI. LAGB patients take single dose as it is written on the package. Vitamin must be liquid or chewable but not gummy. Examples of these include Flintstones Complete and Centrum Complete. If the vitamin is bariatric-specific, take 1 dose as it is already formulated for bariatric surgery patients. Examples of these are Bariatric Advantage, Celebrate, and [employer]. These can be found at the [facility] and/or online.     Calcium citrate: 1500 mg/day of Calcium citrate (also chewable or liquid) is recommended for all procedures. The body is only able to absorb 500-600 mg of Calcium at one time so 3 daily doses of 500 mg are recommended. Calcium doses must be taken a minimum of 2 hours apart. Additionally, Calcium must be taken 2 hours apart from iron-containing MVI. Examples of brands include Celebrate, Bariatric Advantage, and Wellesse. These brands must be purchased online or at the [facility]. Citracal Petites is the only Calcium citrate supplement found in general grocery stores and pharmacies. This is in tablet form and may be recommended for patients who do not tolerate chewable Calcium.  Continued or added Vitamin D supplementation based on individual needs.     Vitamin B12: 300-500 mcg/day for Sleeve Gastrectomy and RYGB. Optional for LAGB patients as stomach remains fully intact. Must be taken intramuscularly, sublingually, or inhaled nasally. Oral route is not recommended.

## 2016-03-26 NOTE — Progress Notes (Signed)
  Pre-Op Assessment Visit:  Pre-Operative Sleeve Gastrectomy Surgery  Medical Nutrition Therapy:  Appt start time: 1120   End time:  1200.  Patient was seen on 03/26/2016 for Pre-Operative Nutrition Assessment. Assessment and letter of approval faxed to [facility] Surgery Bariatric Surgery Program coordinator on 03/26/2016.   Preferred Learning Style:   No preference indicated   Learning Readiness:   Ready  Handouts given during visit include:  Pre-Op Goals Bariatric Surgery Protein Shakes   During the appointment today the following Pre-Op Goals were reviewed with the patient: Maintain or lose weight as instructed by your surgeon Make healthy food choices Begin to limit portion sizes Limited concentrated sugars and fried foods Keep fat/sugar in the single digits per serving on   food labels Practice CHEWING your food  (aim for 30 chews per bite or until applesauce consistency) Practice not drinking 15 minutes before, during, and 30 minutes after each meal/snack Avoid all carbonated beverages  Avoid/limit caffeinated beverages  Avoid all sugar-sweetened beverages Consume 3 meals per day; eat every 3-5 hours Make a list of non-food related activities Aim for 64-100 ounces of FLUID daily  Aim for at least 60-80 grams of PROTEIN daily Look for a liquid protein source that contain ?15 g protein and ?5 g carbohydrate  (ex: shakes, drinks, shots)  Patient-Centered Goals: Goals: would like to lose weight to get knee surgery, improve back pain/problems, decrease breast and stomach size, better clothing options, living longer, exercise more, get back to work  10 confidence/10 importance scale   Demonstrated degree of understanding via:  Teach Back  Teaching Method Utilized:  Visual Auditory Hands on  Barriers to learning/adherence to lifestyle change: none  [facility] to [facility] to enroll in Pre-Op and Post-Op Nutrition Education when  surgery date is scheduled.

## 2016-03-30 ENCOUNTER — Telehealth: Payer: Self-pay | Admitting: Internal Medicine

## 2016-03-30 NOTE — Telephone Encounter (Signed)
I have gotten verbal consent from patient that it is okay for me to give information to [facility] with [place]. I have spoken to [facility] as well and have advised that the medication name is welchol and colestipol.

## 2016-04-07 ENCOUNTER — Telehealth: Payer: Self-pay | Admitting: Pulmonary Disease

## 2016-04-07 DIAGNOSIS — G4733 Obstructive sleep apnea (adult) (pediatric): Secondary | ICD-10-CM

## 2016-04-07 NOTE — Telephone Encounter (Signed)
   Can we d/c the order for cpap? Thanks.  AD

## 2016-04-07 NOTE — Telephone Encounter (Signed)
Pt returned call. Needs to know if the order was sent over to [facility] to cancel this

## 2016-04-07 NOTE — Telephone Encounter (Signed)
Called made pt aware order was to d/c CPAP. Nothing further needed

## 2016-04-07 NOTE — Telephone Encounter (Signed)
Called spoke with pt. She reports her insurance is canceled and she is unable to pay the out of pocket price for the CPAP. Her DME is requesting an order to d/c the CPAP. Pt is wanting this as well. Pt reports once she gets new insurance she will call to get re set up again. Please advise Dr. Corrie Dandy. Thanks

## 2016-04-07 NOTE — Telephone Encounter (Signed)
Order has been placed, LMTCB x1 for pt to make aware.

## 2016-04-09 ENCOUNTER — Other Ambulatory Visit (HOSPITAL_COMMUNITY): Payer: Self-pay | Admitting: General Surgery

## 2016-04-17 ENCOUNTER — Ambulatory Visit (HOSPITAL_COMMUNITY): Admission: RE | Admit: 2016-04-17 | Payer: BLUE CROSS/BLUE SHIELD | Source: Ambulatory Visit

## 2016-05-11 ENCOUNTER — Ambulatory Visit: Payer: Self-pay | Admitting: Pulmonary Disease

## 2016-05-19 ENCOUNTER — Encounter (HOSPITAL_COMMUNITY): Payer: Self-pay | Admitting: *Deleted

## 2016-05-19 ENCOUNTER — Ambulatory Visit (HOSPITAL_COMMUNITY)
Admission: EM | Admit: 2016-05-19 | Discharge: 2016-05-19 | Disposition: A | Discharge status: Home / Self Care | Payer: Self-pay | Attending: Family Medicine | Admitting: Family Medicine

## 2016-05-19 ENCOUNTER — Encounter (HOSPITAL_COMMUNITY): Payer: Self-pay | Admitting: Emergency Medicine

## 2016-05-19 DIAGNOSIS — M25562 Pain in left knee: Secondary | ICD-10-CM

## 2016-05-19 DIAGNOSIS — M25561 Pain in right knee: Secondary | ICD-10-CM

## 2016-05-19 MED ORDER — METHYLPREDNISOLONE 4 MG PO TBPK: ORAL_TABLET | ORAL | 0 refills | Status: DC

## 2016-05-19 MED ORDER — KETOROLAC TROMETHAMINE 60 MG/2ML IM SOLN
60.0000 mg | Freq: Once | INTRAMUSCULAR | Status: AC
  Administered 2016-05-19: 60 mg via INTRAMUSCULAR

## 2016-05-19 MED ORDER — KETOROLAC TROMETHAMINE 60 MG/2ML IM SOLN
INTRAMUSCULAR | Status: AC
  Filled 2016-05-19: qty 2

## 2016-05-19 NOTE — ED Provider Notes (Signed)
CSN: [id number]     Arrival date & time 05/19/16  1231 History   None    Chief Complaint  Patient presents with  . Knee Pain   (Consider location/radiation/quality/duration/timing/severity/associated sxs/prior Treatment) Patient is having moderate to severe bilateral knee pain.  She has chronic knee pain and she is seeing pain management/orthopedics who do routine knee injections.  She gets both steroid and hyalgan knee injections bilateral knees.  She is engaged with pain clinic but couldn't see her today.   The history is provided by the patient.  Knee Pain  Location:  Knee Time since incident:  1 week Injury: no   Knee location:  L knee and R knee Pain details:    Quality:  Aching   Radiates to:  Does not radiate   Duration:  1 week   Timing:  Constant   Progression:  Unchanged Chronicity:  Chronic Dislocation: no   Foreign body present:  No foreign bodies Tetanus status:  Unknown Prior injury to area:  No Relieved by:  Nothing Worsened by:  Nothing Ineffective treatments:  Arthritis medication and NSAIDs   Past Medical History:  Diagnosis Date  . Arthritis   . Chronic pain   . Diverticulosis   . Hyperplastic colon polyp   . Numbness    left leg  . Obesity   . Pneumothorax   . Schatzki's ring   . Seizures (HCC)    hx of, no seizures last few years   Past Surgical History:  Procedure Laterality Date  . CHEST TUBE INSERTION    . COLONOSCOPY WITH PROPOFOL N/A 11/01/2014   Procedure: COLONOSCOPY WITH PROPOFOL;  Surgeon: Jerene Bears, MD;  Location: [facility];  Service: Gastroenterology;  Laterality: N/A;  . ESOPHAGOGASTRODUODENOSCOPY (EGD) WITH PROPOFOL N/A 07/29/2015   Procedure: ESOPHAGOGASTRODUODENOSCOPY (EGD) WITH PROPOFOL;  Surgeon: Jerene Bears, MD;  Location: [facility];  Service: Gastroenterology;  Laterality: N/A;  . PERIPHERAL VASCULAR CATHETERIZATION N/A 01/07/2016   Procedure: Lower Extremity Angiography;  Surgeon: Adrian Prows, MD;  Location: [place];  Service: Cardiovascular;  Laterality: N/A;  . PERIPHERAL VASCULAR CATHETERIZATION Right 02/25/2016   Procedure: Peripheral Vascular Atherectomy;  Surgeon: Adrian Prows, MD;  Location: [facility];  Service: Cardiovascular;  Laterality: Right;  SFA ATHERECTOMY/DRUG COATED PTA  . TUBAL LIGATION     Family History  Problem Relation Age of Onset  . Heart failure Father   . Diabetes Father   . Diabetes Maternal Grandmother   . Diabetes Paternal Grandfather   . Cancer Maternal Grandfather     unknown type   Social History  Substance Use Topics  . Smoking status: Former Smoker    Packs/day: 1.50    Years: 0.00    Types: Cigarettes    Quit date: 05/04/2009  . Smokeless tobacco: Never Used  . Alcohol use Yes     Comment: occasionally   OB History    No data available     Review of Systems  Constitutional: Negative.   HENT: Negative.   Eyes: Negative.   Respiratory: Negative.   Cardiovascular: Negative.   Gastrointestinal: Negative.   Endocrine: Negative.   Genitourinary: Negative.   Musculoskeletal: Positive for arthralgias.  Skin: Negative.   Allergic/Immunologic: Negative.   Neurological: Negative.   Hematological: Negative.   Psychiatric/Behavioral: Negative.     Allergies  Review of patient's allergies indicates no known allergies.  Home Medications   Prior to Admission medications   Medication Sig Start Date End Date  Taking? Authorizing Provider  amitriptyline (ELAVIL) 75 MG tablet Take 75 mg by mouth at bedtime.    Historical Provider, MD  aspirin 81 MG chewable tablet Chew 81 mg by mouth daily.    Historical Provider, MD  atorvastatin (LIPITOR) 20 MG tablet Take 20 mg by mouth daily.    Historical Provider, MD  budesonide-formoterol (SYMBICORT) 160-4.5 MCG/ACT inhaler Inhale 2 puffs into the lungs 2 (two) times daily. 02/07/16   Murray, MD  clopidogrel (PLAVIX) 75 MG tablet Take 1 tablet (75 mg total) by mouth daily. 02/25/16    Adrian Prows, MD  colestipol (COLESTID) 1 g tablet Take 2 tablets (2 g total) by mouth 2 (two) times daily. 03/16/16   Jerene Bears, MD  cyclobenzaprine (FLEXERIL) 10 MG tablet Take 1 tablet by mouth 3 (three) times daily.    Historical Provider, MD  ferrous sulfate 325 (65 FE) MG tablet Take 2 tablets by mouth every other day.    Historical Provider, MD  furosemide (LASIX) 40 MG tablet Take 1 tablet (40 mg total) by mouth daily as needed for edema. 12/12/15   Iline Oven, MD  gabapentin (NEURONTIN) 300 MG capsule Take 2-3 capsules (600-900 mg total) by mouth 3 (three) times daily. 600mg  in the morning, 600mg  in the afternoon.  900mg  in the evening. 02/21/16   Frazier Richards, MD  HYDROcodone-acetaminophen (NORCO/VICODIN) 5-325 MG tablet Take 1 tablet by mouth every 6 (six) hours as needed for moderate pain. 02/21/16   Frazier Richards, MD  loperamide (IMODIUM A-D) 2 MG tablet Take 1 tablet (2 mg total) by mouth 3 (three) times daily. 02/11/16   Jerene Bears, MD  Methocarbamol (ROBAXIN PO) Take by mouth.    Historical Provider, MD  methylPREDNISolone (MEDROL DOSEPAK) 4 MG TBPK tablet Take 6-5-4-3-2-1 po qd 05/19/16   Lysbeth Penner, FNP  pantoprazole (PROTONIX) 20 MG tablet Take 20 mg by mouth daily.    Historical Provider, MD  tiZANidine (ZANAFLEX) 4 MG tablet Take 1-2 tablets (4-8 mg total) by mouth every 6 (six) hours as needed for muscle spasms. 02/21/16   Frazier Richards, MD   Meds Ordered and Administered this Visit   Medications  ketorolac (TORADOL) injection 60 mg (60 mg Intramuscular Given 05/19/16 1437)    BP 138/69 (BP Location: Right Wrist)   Pulse 80   Temp 98.4 F (36.9 C) (Oral)   Resp 20   Ht 5' (1.524 m)   Wt 275 lb (124.7 kg)   LMP 02/27/2016 (Exact Date)   SpO2 99%   BMI 53.71 kg/m  No data found.   Physical Exam  Constitutional: She appears well-developed and well-nourished.  HENT:  Head: Normocephalic and atraumatic.  Eyes: EOM are normal. Pupils are equal, round, and  reactive to light.  Neck: Normal range of motion. Neck supple.  Cardiovascular: Normal rate, regular rhythm and normal heart sounds.   Pulmonary/Chest: Effort normal and breath sounds normal.  Musculoskeletal: She exhibits tenderness.  TTP bilateral knees  Nursing note and vitals reviewed.   Urgent Care Course   Clinical Course    Procedures (including critical care time)  Labs Review Labs Reviewed - No data to display  Imaging Review No results found.   Visual Acuity Review  Right Eye Distance:   Left Eye Distance:   Bilateral Distance:    Right Eye Near:   Left Eye Near:    Bilateral Near:         MDM  1. Arthralgia of both knees    Explained she needs to follow up with her Ortho/pain doctor who is doing her knee injections for knee injections.  Explained she doesn't need to get too many knee injections because they can be harmful and cannot get too often.  Since she is getting knee injections from pain she should follow up there for knee injections and for knee pain.  Medrol dose pack as directed #21 Toradol 60mg  IM    Lysbeth Penner, FNP 05/19/16 1446

## 2016-05-19 NOTE — ED Triage Notes (Signed)
The patient presented to the [facility] with a complaint of chronic bilateral knee pain related to osteoarthritis that flared up 1 week ago and has been painful since.

## 2016-06-20 IMAGING — CT CT ABD-PELV W/ CM
2 of 5 series · 16 of 46 positions shown, 18 images · IV contrast (ISOVUE 300)
Comparison: None

CLINICAL DATA: History of IBS.  Chronic diarrhea.  New onset.

EXAM:
CT ABDOMEN AND PELVIS WITH CONTRAST
TECHNIQUE: Multidetector CT imaging of the abdomen and pelvis was performed
using the standard protocol following bolus administration of
intravenous contrast.
CONTRAST:  100mL 6SF2NA-JDD IOPAMIDOL (6SF2NA-JDD) INJECTION 61%

[Series 2: abd/ pelvis · axial · 0.74mm/px · z∈[+808,+1194]mm · 13 of 87 slices shown, 15 images]
[im 5/87  soft-tissue]
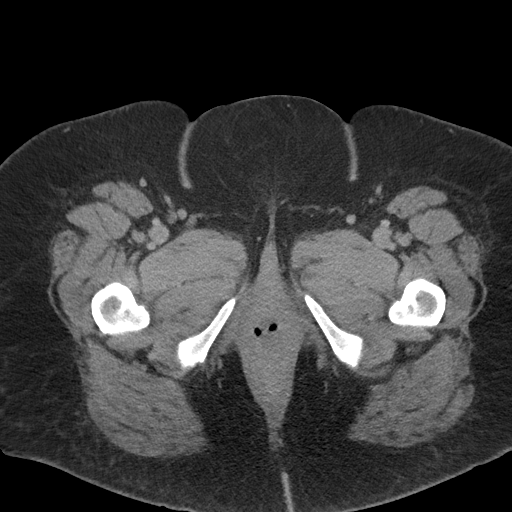
[im 5/87  bone]
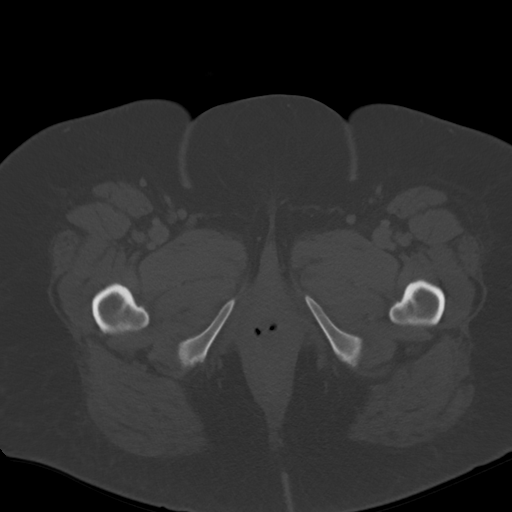
[im 13/87  soft-tissue]
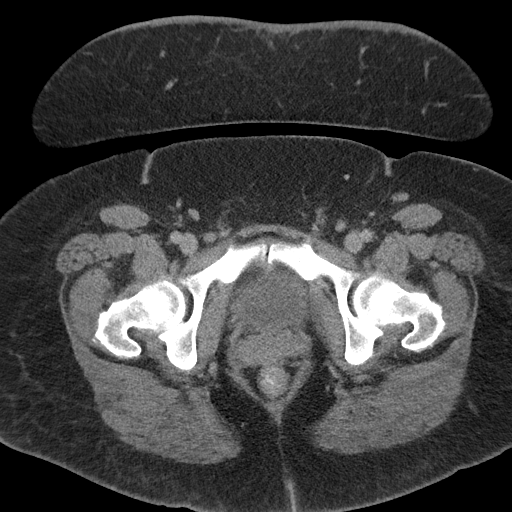
[im 18/87  soft-tissue]
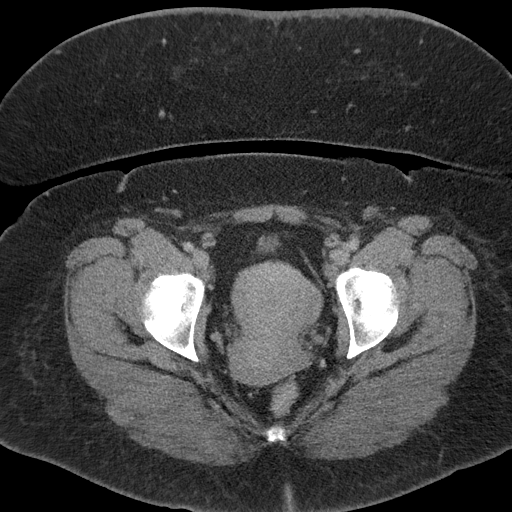
[im 26/87  soft-tissue]
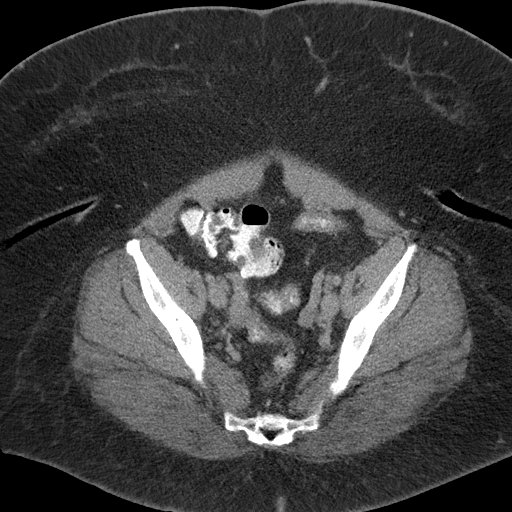
[im 31/87  soft-tissue]
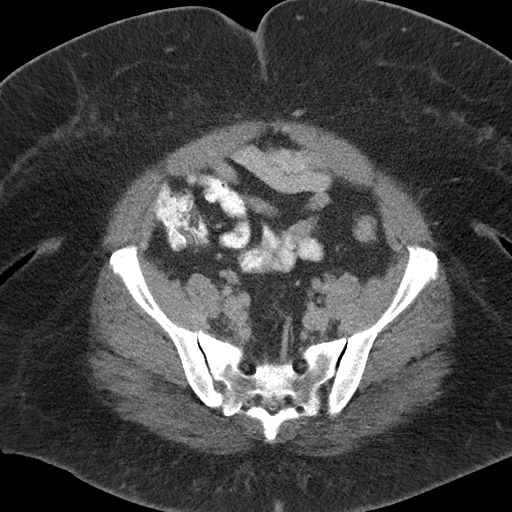
[im 39/87  soft-tissue]
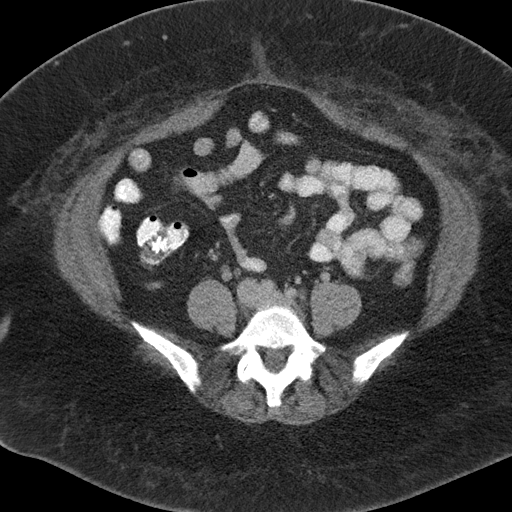
[im 44/87  soft-tissue]
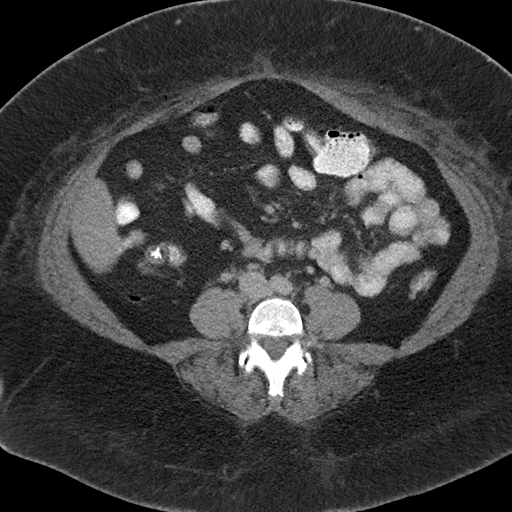
[im 48/87  soft-tissue]
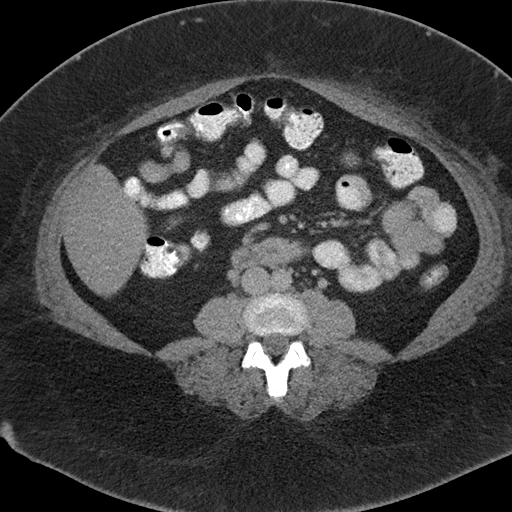
[im 56/87  soft-tissue]
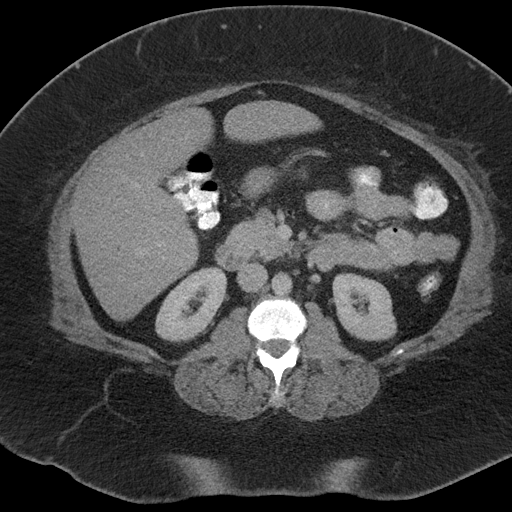
[im 56/87  bone]
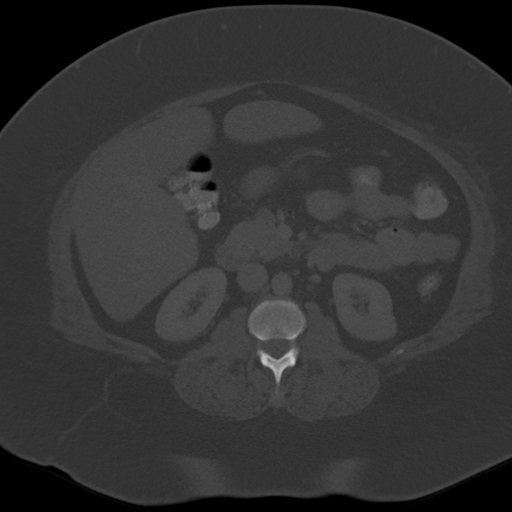
[im 61/87  soft-tissue]
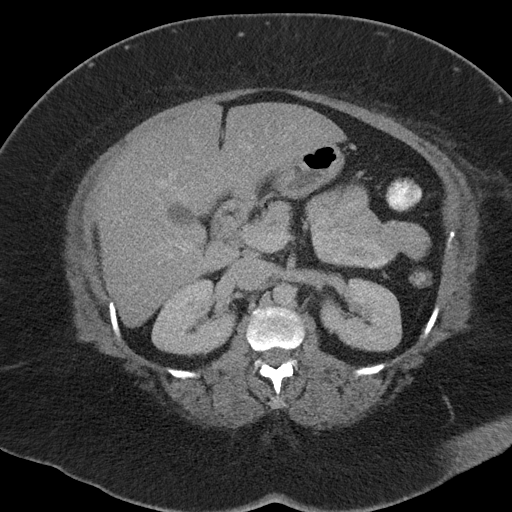
[im 69/87  soft-tissue]
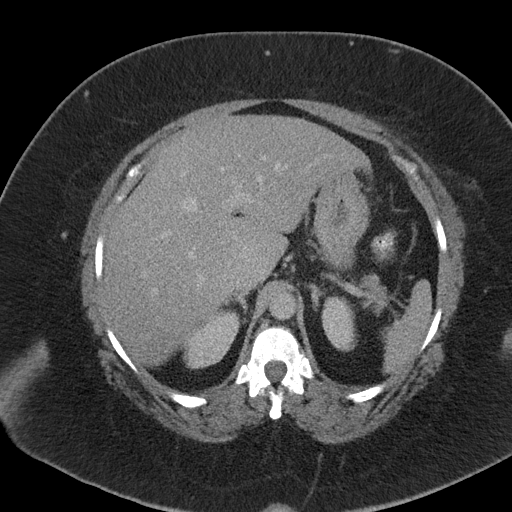
[im 74/87  soft-tissue]
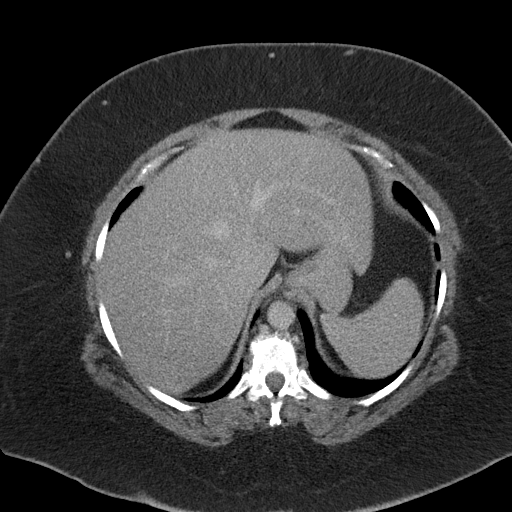
[im 82/87  soft-tissue]
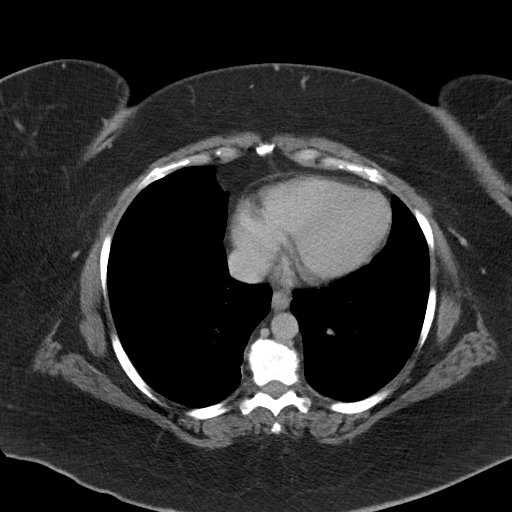

[Series 5: coronal soft tissue · coronal · 0.84mm/px · 3 of 103 slices shown]
[im 35/103  soft-tissue]
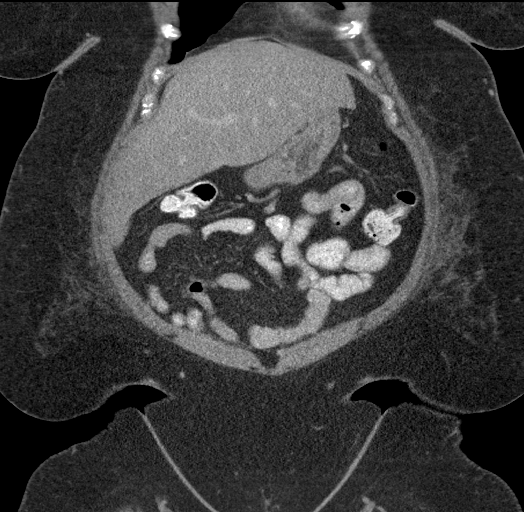
[im 46/103  soft-tissue]
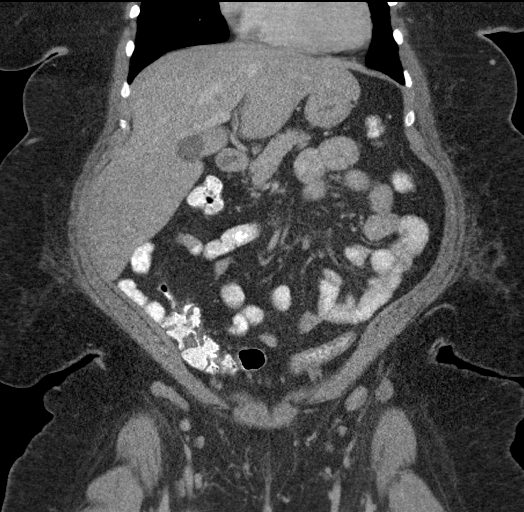
[im 57/103  soft-tissue]
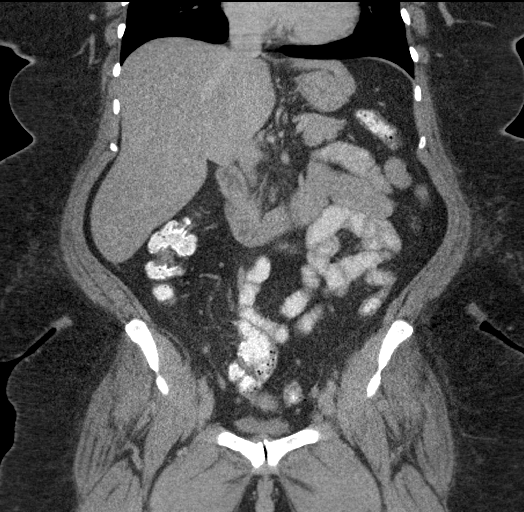

[16 of 46 positions shown; findings below may reference images not displayed]

FINDINGS: Lower chest: 3 mm nodule within the right middle lobe is identified,
image 2 of series 3.

Hepatobiliary: Hepatic steatosis noted.  No focal liver abnormality.

Pancreas: No mass, inflammatory changes, or other significant
abnormality.

Spleen: Within normal limits in size and appearance.

Adrenals/Urinary Tract: Normal adrenal glands. The kidneys both
appear normal. No nephrolithiasis or hydronephrosis. The urinary
bladder appears within normal limits.

Stomach/Bowel: The stomach is normal. The small bowel loops have a
normal course in caliber. There is no bowel obstruction. Normal
appearance of the appendix. Unremarkable appearance of the proximal
colon. A few scattered distal colonic diverticula noted. No acute
inflammation.

Vascular/Lymphatic: Normal appearance of the abdominal aorta. No
enlarged retroperitoneal or mesenteric adenopathy. No enlarged
pelvic or inguinal lymph nodes.

Reproductive: The uterus appears normal. Tubal ligation clips noted.
No adnexal mass.

Other: There is no ascites or focal fluid collections within the
abdomen or pelvis.

Musculoskeletal: Degenerative disc disease is identified within the
lumbar spine. No aggressive lytic or sclerotic bone lesions
identified.
IMPRESSION: 1. No acute findings identified within the abdomen or pelvis.
2. Right middle lobe lung nodule measures 3 mm. No follow-up needed
if patient is low-risk. Non-contrast chest CT can be considered in
12 months if patient is high-risk. This recommendation follows the
consensus statement: Guidelines for Management of Incidental
Pulmonary Nodules Detected on CT Images:From the [HOSPITAL]
0123; published online before print (10.1148/radiol.5317111193).

## 2016-08-09 DIAGNOSIS — R0989 Other specified symptoms and signs involving the circulatory and respiratory systems: Secondary | ICD-10-CM | POA: Insufficient documentation

## 2016-08-09 DIAGNOSIS — K219 Gastro-esophageal reflux disease without esophagitis: Secondary | ICD-10-CM | POA: Insufficient documentation

## 2016-12-28 ENCOUNTER — Ambulatory Visit: Payer: Self-pay | Attending: Internal Medicine | Admitting: Podiatry

## 2016-12-28 ENCOUNTER — Encounter (HOSPITAL_COMMUNITY): Payer: Self-pay | Admitting: Emergency Medicine

## 2016-12-28 ENCOUNTER — Emergency Department (HOSPITAL_COMMUNITY): Payer: Self-pay

## 2016-12-28 DIAGNOSIS — E785 Hyperlipidemia, unspecified: Secondary | ICD-10-CM | POA: Diagnosis present

## 2016-12-28 DIAGNOSIS — Z79899 Other long term (current) drug therapy: Secondary | ICD-10-CM

## 2016-12-28 DIAGNOSIS — M1990 Unspecified osteoarthritis, unspecified site: Secondary | ICD-10-CM | POA: Diagnosis present

## 2016-12-28 DIAGNOSIS — Z7902 Long term (current) use of antithrombotics/antiplatelets: Secondary | ICD-10-CM

## 2016-12-28 DIAGNOSIS — J441 Chronic obstructive pulmonary disease with (acute) exacerbation: Principal | ICD-10-CM | POA: Diagnosis present

## 2016-12-28 DIAGNOSIS — R0603 Acute respiratory distress: Secondary | ICD-10-CM | POA: Diagnosis present

## 2016-12-28 DIAGNOSIS — G8929 Other chronic pain: Secondary | ICD-10-CM | POA: Diagnosis present

## 2016-12-28 DIAGNOSIS — K219 Gastro-esophageal reflux disease without esophagitis: Secondary | ICD-10-CM | POA: Diagnosis present

## 2016-12-28 DIAGNOSIS — G629 Polyneuropathy, unspecified: Secondary | ICD-10-CM

## 2016-12-28 DIAGNOSIS — Z6841 Body Mass Index (BMI) 40.0 and over, adult: Secondary | ICD-10-CM

## 2016-12-28 DIAGNOSIS — Z8249 Family history of ischemic heart disease and other diseases of the circulatory system: Secondary | ICD-10-CM

## 2016-12-28 DIAGNOSIS — B351 Tinea unguium: Secondary | ICD-10-CM

## 2016-12-28 DIAGNOSIS — M19072 Primary osteoarthritis, left ankle and foot: Secondary | ICD-10-CM

## 2016-12-28 DIAGNOSIS — R042 Hemoptysis: Secondary | ICD-10-CM | POA: Diagnosis present

## 2016-12-28 DIAGNOSIS — G40909 Epilepsy, unspecified, not intractable, without status epilepticus: Secondary | ICD-10-CM | POA: Diagnosis present

## 2016-12-28 DIAGNOSIS — G4733 Obstructive sleep apnea (adult) (pediatric): Secondary | ICD-10-CM | POA: Diagnosis present

## 2016-12-28 DIAGNOSIS — Z833 Family history of diabetes mellitus: Secondary | ICD-10-CM

## 2016-12-28 DIAGNOSIS — Z7982 Long term (current) use of aspirin: Secondary | ICD-10-CM

## 2016-12-28 DIAGNOSIS — D72829 Elevated white blood cell count, unspecified: Secondary | ICD-10-CM | POA: Diagnosis present

## 2016-12-28 DIAGNOSIS — I5032 Chronic diastolic (congestive) heart failure: Secondary | ICD-10-CM | POA: Diagnosis present

## 2016-12-28 DIAGNOSIS — I110 Hypertensive heart disease with heart failure: Secondary | ICD-10-CM | POA: Diagnosis present

## 2016-12-28 DIAGNOSIS — I739 Peripheral vascular disease, unspecified: Secondary | ICD-10-CM | POA: Diagnosis present

## 2016-12-28 LAB — COMPREHENSIVE METABOLIC PANEL
ALT: 29 U/L (ref 14–54)
AST: 21 U/L (ref 15–41)
Albumin: 3.2 g/dL — ABNORMAL LOW (ref 3.5–5.0)
Alkaline Phosphatase: 75 U/L (ref 38–126)
Anion gap: 6 (ref 5–15)
BILIRUBIN TOTAL: 0.5 mg/dL (ref 0.3–1.2)
BUN: 6 mg/dL (ref 6–20)
CHLORIDE: 103 mmol/L (ref 101–111)
CO2: 29 mmol/L (ref 22–32)
CREATININE: 0.71 mg/dL (ref 0.44–1.00)
Calcium: 8.8 mg/dL — ABNORMAL LOW (ref 8.9–10.3)
Glucose, Bld: 119 mg/dL — ABNORMAL HIGH (ref 65–99)
POTASSIUM: 4.3 mmol/L (ref 3.5–5.1)
Sodium: 138 mmol/L (ref 135–145)
TOTAL PROTEIN: 6.4 g/dL — AB (ref 6.5–8.1)

## 2016-12-28 LAB — CBC WITH DIFFERENTIAL/PLATELET
BASOS ABS: 0 10*3/uL (ref 0.0–0.1)
Basophils Relative: 0 %
EOS ABS: 0.5 10*3/uL (ref 0.0–0.7)
EOS PCT: 4 %
HCT: 37.3 % (ref 36.0–46.0)
HEMOGLOBIN: 12.1 g/dL (ref 12.0–15.0)
LYMPHS PCT: 26 %
Lymphs Abs: 2.9 10*3/uL (ref 0.7–4.0)
MCH: 28.9 pg (ref 26.0–34.0)
MCHC: 32.4 g/dL (ref 30.0–36.0)
MCV: 89 fL (ref 78.0–100.0)
Monocytes Absolute: 0.7 10*3/uL (ref 0.1–1.0)
Monocytes Relative: 6 %
NEUTROS PCT: 64 %
Neutro Abs: 7 10*3/uL (ref 1.7–7.7)
PLATELETS: 335 10*3/uL (ref 150–400)
RBC: 4.19 MIL/uL (ref 3.87–5.11)
RDW: 12.5 % (ref 11.5–15.5)
WBC: 11.1 10*3/uL — AB (ref 4.0–10.5)

## 2016-12-28 MED ORDER — CAPSAICIN 0.025 % EX CREA: TOPICAL_CREAM | Freq: Two times a day (BID) | CUTANEOUS | 0 refills | Status: DC

## 2016-12-28 MED ORDER — CICLOPIROX 8 % EX SOLN: Freq: Every day | CUTANEOUS | 0 refills | Status: DC

## 2016-12-28 NOTE — ED Triage Notes (Signed)
Pt to ED from home c/o sudden onset hemoptysis about 30-45 minutes ago. Pt states she was recently diagnosed with COPD, but is not on home O2, denies SOB/CP. Does express exertional fatigue, but cannot walk long distances. Pt endorses productive cough x 2 days (started with phlegm and mucus and progressed to blood today). Pt denies fevers, sore throat or other symptoms.

## 2016-12-29 ENCOUNTER — Encounter (HOSPITAL_COMMUNITY): Payer: Self-pay | Admitting: Physician Assistant

## 2016-12-29 ENCOUNTER — Observation Stay (HOSPITAL_COMMUNITY): Payer: Self-pay

## 2016-12-29 ENCOUNTER — Inpatient Hospital Stay (HOSPITAL_COMMUNITY)
Admission: EM | Admit: 2016-12-29 | Discharge: 2017-01-01 | DRG: 191 | Disposition: A | Discharge status: Home / Self Care | Payer: Self-pay | Attending: Nephrology | Admitting: Nephrology

## 2016-12-29 DIAGNOSIS — E784 Other hyperlipidemia: Secondary | ICD-10-CM

## 2016-12-29 DIAGNOSIS — E782 Mixed hyperlipidemia: Secondary | ICD-10-CM

## 2016-12-29 DIAGNOSIS — J441 Chronic obstructive pulmonary disease with (acute) exacerbation: Secondary | ICD-10-CM | POA: Insufficient documentation

## 2016-12-29 DIAGNOSIS — J449 Chronic obstructive pulmonary disease, unspecified: Secondary | ICD-10-CM

## 2016-12-29 DIAGNOSIS — K219 Gastro-esophageal reflux disease without esophagitis: Secondary | ICD-10-CM

## 2016-12-29 DIAGNOSIS — R042 Hemoptysis: Secondary | ICD-10-CM

## 2016-12-29 DIAGNOSIS — I10 Essential (primary) hypertension: Secondary | ICD-10-CM

## 2016-12-29 DIAGNOSIS — R0602 Shortness of breath: Secondary | ICD-10-CM

## 2016-12-29 DIAGNOSIS — G4733 Obstructive sleep apnea (adult) (pediatric): Secondary | ICD-10-CM | POA: Diagnosis present

## 2016-12-29 HISTORY — DX: Essential (primary) hypertension: I10

## 2016-12-29 HISTORY — DX: Gastro-esophageal reflux disease without esophagitis: K21.9

## 2016-12-29 LAB — TSH: TSH: 1.183 u[IU]/mL (ref 0.350–4.500)

## 2016-12-29 LAB — I-STAT ARTERIAL BLOOD GAS, ED
BICARBONATE: 25.4 mmol/L (ref 20.0–28.0)
O2 Saturation: 98 %
TCO2: 27 mmol/L (ref 0–100)
pCO2 arterial: 43.7 mmHg (ref 32.0–48.0)
pH, Arterial: 7.372 (ref 7.350–7.450)
pO2, Arterial: 106 mmHg (ref 83.0–108.0)

## 2016-12-29 LAB — TYPE AND SCREEN
ABO/RH(D): B POS
ANTIBODY SCREEN: NEGATIVE

## 2016-12-29 LAB — TROPONIN I
Troponin I: <0.03 ng/mL (ref <0.03)
Troponin I: <0.03 ng/mL (ref <0.03)

## 2016-12-29 LAB — BRAIN NATRIURETIC PEPTIDE: B Natriuretic Peptide: 29.9 pg/mL (ref 0.0–100.0)

## 2016-12-29 LAB — D-DIMER, QUANTITATIVE: D-Dimer, Quant: 0.29 ug{FEU}/mL (ref 0.00–0.50)

## 2016-12-29 LAB — PROTIME-INR
INR: 1.07
Prothrombin Time: 14 seconds (ref 11.4–15.2)

## 2016-12-29 LAB — ABO/RH: ABO/RH(D): B POS

## 2016-12-29 LAB — MRSA PCR SCREENING: MRSA by PCR: NEGATIVE

## 2016-12-29 MED ORDER — IOPAMIDOL (ISOVUE-370) INJECTION 76%
INTRAVENOUS | Status: AC
  Administered 2016-12-29: 100 mL
  Filled 2016-12-29: qty 100

## 2016-12-29 MED ORDER — COLESTIPOL HCL 1 G PO TABS
2.0000 g | ORAL_TABLET | Freq: Two times a day (BID) | ORAL | Status: DC
  Administered 2016-12-29 – 2017-01-01 (×7): 2 g via ORAL
  Filled 2016-12-29 (×7): qty 2

## 2016-12-29 MED ORDER — ASPIRIN 81 MG PO CHEW
81.0000 mg | CHEWABLE_TABLET | Freq: Every day | ORAL | Status: DC
  Administered 2016-12-29 – 2017-01-01 (×4): 81 mg via ORAL
  Filled 2016-12-29 (×4): qty 1

## 2016-12-29 MED ORDER — HYDROCODONE-ACETAMINOPHEN 5-325 MG PO TABS
1.0000 | ORAL_TABLET | Freq: Four times a day (QID) | ORAL | Status: DC | PRN
  Administered 2016-12-30 – 2017-01-01 (×7): 1 via ORAL
  Filled 2016-12-29 (×8): qty 1

## 2016-12-29 MED ORDER — FUROSEMIDE 40 MG PO TABS
40.0000 mg | ORAL_TABLET | Freq: Every day | ORAL | Status: DC
  Administered 2016-12-30 – 2017-01-01 (×3): 40 mg via ORAL
  Filled 2016-12-29 (×4): qty 1

## 2016-12-29 MED ORDER — ALBUTEROL SULFATE (2.5 MG/3ML) 0.083% IN NEBU: 2.5000 mg | INHALATION_SOLUTION | RESPIRATORY_TRACT | Status: DC | PRN

## 2016-12-29 MED ORDER — SODIUM CHLORIDE 0.9% FLUSH
3.0000 mL | Freq: Two times a day (BID) | INTRAVENOUS | Status: DC
  Administered 2016-12-29 – 2017-01-01 (×4): 3 mL via INTRAVENOUS

## 2016-12-29 MED ORDER — TIZANIDINE HCL 4 MG PO TABS
4.0000 mg | ORAL_TABLET | Freq: Four times a day (QID) | ORAL | Status: DC | PRN
  Administered 2016-12-29: 8 mg via ORAL
  Filled 2016-12-29: qty 2

## 2016-12-29 MED ORDER — ATORVASTATIN CALCIUM 20 MG PO TABS
20.0000 mg | ORAL_TABLET | Freq: Every day | ORAL | Status: DC
  Administered 2016-12-29 – 2017-01-01 (×4): 20 mg via ORAL
  Filled 2016-12-29 (×4): qty 1

## 2016-12-29 MED ORDER — POLYETHYLENE GLYCOL 3350 17 G PO PACK: 17.0000 g | PACK | Freq: Every day | ORAL | Status: DC | PRN

## 2016-12-29 MED ORDER — ACETAMINOPHEN 650 MG RE SUPP: 650.0000 mg | Freq: Four times a day (QID) | RECTAL | Status: DC | PRN

## 2016-12-29 MED ORDER — DM-GUAIFENESIN ER 30-600 MG PO TB12
1.0000 | ORAL_TABLET | Freq: Two times a day (BID) | ORAL | Status: DC
  Administered 2016-12-29 – 2017-01-01 (×7): 1 via ORAL
  Filled 2016-12-29 (×7): qty 1

## 2016-12-29 MED ORDER — CLOPIDOGREL BISULFATE 75 MG PO TABS
75.0000 mg | ORAL_TABLET | Freq: Every day | ORAL | Status: DC
  Administered 2016-12-29 – 2017-01-01 (×4): 75 mg via ORAL
  Filled 2016-12-29 (×4): qty 1

## 2016-12-29 MED ORDER — HEPARIN SODIUM (PORCINE) 5000 UNIT/ML IJ SOLN
5000.0000 [IU] | Freq: Three times a day (TID) | INTRAMUSCULAR | Status: DC
  Administered 2016-12-29 – 2016-12-30 (×5): 5000 [IU] via SUBCUTANEOUS
  Filled 2016-12-29 (×4): qty 1

## 2016-12-29 MED ORDER — GABAPENTIN 300 MG PO CAPS: 600.0000 mg | ORAL_CAPSULE | Freq: Three times a day (TID) | ORAL | Status: DC

## 2016-12-29 MED ORDER — FERROUS SULFATE 325 (65 FE) MG PO TABS
650.0000 mg | ORAL_TABLET | ORAL | Status: DC
  Administered 2016-12-29 – 2016-12-31 (×2): 650 mg via ORAL
  Filled 2016-12-29 (×2): qty 2

## 2016-12-29 MED ORDER — DEXTROSE 5 % IV SOLN
100.0000 mg | Freq: Two times a day (BID) | INTRAVENOUS | Status: DC
  Administered 2016-12-29 – 2016-12-31 (×4): 100 mg via INTRAVENOUS
  Filled 2016-12-29 (×5): qty 100

## 2016-12-29 MED ORDER — ACETAMINOPHEN 325 MG PO TABS: 650.0000 mg | ORAL_TABLET | Freq: Four times a day (QID) | ORAL | Status: DC | PRN

## 2016-12-29 MED ORDER — BENZONATATE 100 MG PO CAPS
200.0000 mg | ORAL_CAPSULE | Freq: Three times a day (TID) | ORAL | Status: DC | PRN
  Administered 2016-12-29 – 2016-12-31 (×3): 200 mg via ORAL
  Filled 2016-12-29 (×3): qty 2

## 2016-12-29 MED ORDER — LOPERAMIDE HCL 2 MG PO CAPS: 2.0000 mg | ORAL_CAPSULE | Freq: Four times a day (QID) | ORAL | Status: DC | PRN

## 2016-12-29 MED ORDER — ONDANSETRON HCL 4 MG PO TABS: 4.0000 mg | ORAL_TABLET | Freq: Four times a day (QID) | ORAL | Status: DC | PRN

## 2016-12-29 MED ORDER — FUROSEMIDE 20 MG PO TABS: 40.0000 mg | ORAL_TABLET | Freq: Every day | ORAL | Status: DC | PRN

## 2016-12-29 MED ORDER — METHYLPREDNISOLONE SODIUM SUCC 125 MG IJ SOLR
125.0000 mg | Freq: Once | INTRAMUSCULAR | Status: AC
  Administered 2016-12-29: 125 mg via INTRAVENOUS
  Filled 2016-12-29: qty 2

## 2016-12-29 MED ORDER — ALBUTEROL SULFATE (2.5 MG/3ML) 0.083% IN NEBU
2.5000 mg | INHALATION_SOLUTION | Freq: Once | RESPIRATORY_TRACT | Status: AC
  Administered 2016-12-29: 2.5 mg via RESPIRATORY_TRACT
  Filled 2016-12-29: qty 3

## 2016-12-29 MED ORDER — FUROSEMIDE 10 MG/ML IJ SOLN
60.0000 mg | Freq: Once | INTRAMUSCULAR | Status: AC
  Administered 2016-12-29: 60 mg via INTRAVENOUS
  Filled 2016-12-29: qty 6

## 2016-12-29 MED ORDER — IPRATROPIUM-ALBUTEROL 0.5-2.5 (3) MG/3ML IN SOLN
3.0000 mL | Freq: Once | RESPIRATORY_TRACT | Status: AC
  Administered 2016-12-29: 3 mL via RESPIRATORY_TRACT
  Filled 2016-12-29: qty 3

## 2016-12-29 MED ORDER — GABAPENTIN 300 MG PO CAPS
900.0000 mg | ORAL_CAPSULE | Freq: Three times a day (TID) | ORAL | Status: DC
  Administered 2016-12-29 – 2017-01-01 (×10): 900 mg via ORAL
  Filled 2016-12-29 (×10): qty 3

## 2016-12-29 MED ORDER — IPRATROPIUM-ALBUTEROL 0.5-2.5 (3) MG/3ML IN SOLN
3.0000 mL | RESPIRATORY_TRACT | Status: DC
  Administered 2016-12-29 – 2016-12-30 (×9): 3 mL via RESPIRATORY_TRACT
  Filled 2016-12-29 (×9): qty 3

## 2016-12-29 MED ORDER — ONDANSETRON HCL 4 MG/2ML IJ SOLN: 4.0000 mg | Freq: Four times a day (QID) | INTRAMUSCULAR | Status: DC | PRN

## 2016-12-29 MED ORDER — CALCIUM CARBONATE ANTACID 500 MG PO CHEW
1.0000 | CHEWABLE_TABLET | Freq: Once | ORAL | Status: AC
  Administered 2016-12-30: 200 mg via ORAL
  Filled 2016-12-29: qty 1

## 2016-12-29 MED ORDER — PANTOPRAZOLE SODIUM 20 MG PO TBEC
20.0000 mg | DELAYED_RELEASE_TABLET | Freq: Every day | ORAL | Status: DC
  Administered 2016-12-29 – 2017-01-01 (×4): 20 mg via ORAL
  Filled 2016-12-29 (×4): qty 1

## 2016-12-29 MED ORDER — AMITRIPTYLINE HCL 50 MG PO TABS
100.0000 mg | ORAL_TABLET | Freq: Every day | ORAL | Status: DC
  Administered 2016-12-29 – 2016-12-31 (×3): 100 mg via ORAL
  Filled 2016-12-29 (×4): qty 2

## 2016-12-29 MED ORDER — ZOLPIDEM TARTRATE 5 MG PO TABS
5.0000 mg | ORAL_TABLET | Freq: Every evening | ORAL | Status: DC | PRN
  Administered 2016-12-30 – 2016-12-31 (×3): 5 mg via ORAL
  Filled 2016-12-29 (×3): qty 1

## 2016-12-29 NOTE — ED Provider Notes (Signed)
[name] Provider Note   CSN: [id number] Arrival date & time: 12/28/16  2153    By signing my name below, I, Elizabeth Diaz, attest that this documentation has been prepared under the direction and in the presence of [name], MD. Electronically Signed: Macon Diaz, ED Scribe. 12/29/16. 3:58 AM.  History   Chief Complaint Chief Complaint  Patient presents with  . Hemoptysis   The history is provided by the patient. No language interpreter was used.   HPI Comments: Elizabeth Diaz is a 51 y.o. female with PMHx of COPD who presents to the Emergency Department complaining of gradual onset, persistent, productive cough that occurred ~2 days ago. She notes her mucous had a thick greenish appearance when it began accompanied by phlegm. Pt notes her mucous progressed to having blood in it ~35 minutes PTA. She reports associated SOB. No alleviating factors noted. Per nurse note, pt denies fever and sore throat.   Past Medical History:  Diagnosis Date  . Arthritis   . Chronic pain   . Diverticulosis   . Hyperplastic colon polyp   . Numbness    left leg  . Obesity   . Pneumothorax   . Schatzki's ring   . Seizures ([place])    hx of, no seizures last few years    Patient Active Problem List   Diagnosis Date Noted  . PAD (peripheral artery disease) ([place]) 02/25/2016  . OSA (obstructive sleep apnea) 02/07/2016  . Exertional dyspnea 02/07/2016  . Claudication in peripheral vascular disease ([place]) 01/06/2016  . Lower extremity edema 12/12/2015  . Chest pain 12/12/2015  . Hearing loss 11/27/2015  . Morbid obesity ([place]) 10/29/2015  . Excessive daytime sleepiness 10/05/2015  . History of depression 10/05/2015  . Nail discoloration 10/05/2015  . Chronic diarrhea   . Gastroesophageal reflux disease without esophagitis   . Abdominal pain, epigastric   . Prediabetes 06/10/2015  . Annual physical exam 06/10/2015  . Bilateral lower extremity edema 04/08/2015  .  Urinary incontinence 04/08/2015  . Chronic knee pain 12/07/2014  . Benign neoplasm of rectum   . IBS (irritable bowel syndrome)   . Right-sided back pain 05/14/2014  . Fecal incontinence 05/06/2014    Past Surgical History:  Procedure Laterality Date  . CHEST TUBE INSERTION    . COLONOSCOPY WITH PROPOFOL N/A 11/01/2014   Procedure: COLONOSCOPY WITH PROPOFOL;  Surgeon: Jerene Bears, MD;  Location: [facility];  Service: Gastroenterology;  Laterality: N/A;  . ESOPHAGOGASTRODUODENOSCOPY (EGD) WITH PROPOFOL N/A 07/29/2015   Procedure: ESOPHAGOGASTRODUODENOSCOPY (EGD) WITH PROPOFOL;  Surgeon: Jerene Bears, MD;  [facility];  Service: Gastroenterology;  Laterality: N/A;  . PERIPHERAL VASCULAR CATHETERIZATION N/A 01/07/2016   Procedure: Lower Extremity Angiography;  Surgeon: Adrian Prows, MD;  Location: [facility];  Service: Cardiovascular;  Laterality: N/A;  . PERIPHERAL VASCULAR CATHETERIZATION Right 02/25/2016   Procedure: Peripheral Vascular Atherectomy;  Surgeon: Adrian Prows, MD;  Location: [facility];  Service: Cardiovascular;  Laterality: Right;  SFA ATHERECTOMY/DRUG COATED PTA  . TUBAL LIGATION      OB History    No data available       Home Medications    Prior to Admission medications   Medication Sig Start Date End Date Taking? Authorizing Provider  amitriptyline (ELAVIL) 75 MG tablet Take 100 mg by mouth at bedtime.    Yes Historical Provider, MD  aspirin 81 MG chewable tablet Chew 81 mg by mouth daily.   Yes Historical Provider, MD  atorvastatin (LIPITOR) 20 MG tablet Take 20 mg by mouth daily.   Yes Historical Provider, MD  clopidogrel (PLAVIX) 75 MG tablet Take 1 tablet (75 mg total) by mouth daily. 02/25/16  Yes Adrian Prows, MD  colestipol (COLESTID) 1 g tablet Take 2 tablets (2 g total) by mouth 2 (two) times daily. 03/16/16  Yes Jerene Bears, MD  ferrous sulfate 325 (65 FE) MG tablet Take 2 tablets by mouth every other day.   Yes Historical Provider, MD    furosemide (LASIX) 40 MG tablet Take 1 tablet (40 mg total) by mouth daily as needed for edema. 12/12/15  Yes Iline Oven, MD  gabapentin (NEURONTIN) 300 MG capsule Take 2-3 capsules (600-900 mg total) by mouth 3 (three) times daily. 600mg  in the morning, 600mg  in the afternoon.  900mg  in the evening. Patient taking differently: Take 900 mg by mouth 3 (three) times daily.  02/21/16  Yes Frazier Richards, MD  HYDROcodone-acetaminophen (NORCO/VICODIN) 5-325 MG tablet Take 1 tablet by mouth every 6 (six) hours as needed for moderate pain. 02/21/16  Yes Frazier Richards, MD  loperamide (IMODIUM A-D) 2 MG tablet Take 1 tablet (2 mg total) by mouth 3 (three) times daily. Patient taking differently: Take 2 mg by mouth 4 (four) times daily as needed for diarrhea or loose stools.  02/11/16  Yes Jerene Bears, MD  pantoprazole (PROTONIX) 20 MG tablet Take 20 mg by mouth daily.   Yes Historical Provider, MD  tiZANidine (ZANAFLEX) 4 MG tablet Take 1-2 tablets (4-8 mg total) by mouth every 6 (six) hours as needed for muscle spasms. 02/21/16  Yes Frazier Richards, MD  capsaicin (ZOSTRIX) 0.025 % cream Apply topically 2 (two) times daily. Patient not taking: Reported on 12/29/2016 12/28/16   Trula Slade, DPM  ciclopirox [facility]) 8 % solution Apply topically at bedtime. Apply over nail and surrounding skin. Apply daily over previous coat. After seven (7) days, may remove with alcohol and continue cycle. Patient not taking: Reported on 12/29/2016 12/28/16   Trula Slade, DPM    Family History Family History  Problem Relation Age of Onset  . Heart failure Father   . Diabetes Father   . Diabetes Maternal Grandmother   . Diabetes Paternal Grandfather   . Cancer Maternal Grandfather     unknown type    Social History Social History  Substance Use Topics  . Smoking status: Former Smoker    Packs/day: 1.50    Years: 0.00    Types: Cigarettes    Quit date: 05/04/2009  . Smokeless tobacco: Never Used  . Alcohol  use Yes     Comment: occasionally     Allergies   Patient has no known allergies.   Review of Systems Review of Systems  Constitutional: Negative for fever.  HENT: Negative for sore throat.   Respiratory: Positive for cough.   All other systems reviewed and are negative.    Physical Exam Updated Vital Signs BP (!) 149/83   Pulse (!) 101   Temp 98.7 F (37.1 C) (Oral)   Resp 16   Ht 5' (1.524 m)   Wt 283 lb (128.4 kg)   LMP 12/03/2016 (Approximate)   SpO2 95%   BMI 55.27 kg/m   Physical Exam  Constitutional: She is oriented to person, place, and time. She appears well-developed and well-nourished. No distress.  HENT:  Head: Normocephalic and atraumatic.  Right Ear: Hearing normal.  Left Ear: Hearing normal.  Nose: Nose  normal.  Mouth/Throat: Oropharynx is clear and moist and mucous membranes are normal.  Eyes: Conjunctivae and EOM are normal. Pupils are equal, round, and reactive to light.  Neck: Normal range of motion. Neck supple.  Cardiovascular: S1 normal and S2 normal.  Exam reveals no gallop and no friction rub.   No murmur heard. Tachycardia.   Pulmonary/Chest: She is in respiratory distress. She has wheezes. She exhibits no tenderness.  Tachypnea, increased work breathing. Difficulty completing short sentence with breathing. Wheezing bilaterally with decreased airway.   Abdominal: Soft. Normal appearance and bowel sounds are normal. There is no hepatosplenomegaly. There is no tenderness. There is no rebound, no guarding, no tenderness at McBurney's point and negative Murphy's sign. No hernia.  Musculoskeletal: Normal range of motion.  Neurological: She is alert and oriented to person, place, and time. She has normal strength. No cranial nerve deficit or sensory deficit. Coordination normal. GCS eye subscore is 4. GCS verbal subscore is 5. GCS motor subscore is 6.  Skin: Skin is warm, dry and intact. No rash noted. No cyanosis.  Psychiatric: She has a normal  mood and affect. Her speech is normal and behavior is normal. Thought content normal.  Nursing note and vitals reviewed.    ED Treatments / Results   DIAGNOSTIC STUDIES: Oxygen Saturation is 99% on RA, normal by my interpretation.    COORDINATION OF CARE: 3:46 AM Discussed treatment plan with pt at bedside which includes labs, chest imaging and breathing treatments and pt agreed to plan.   Labs (all labs ordered are listed, but only abnormal results are displayed) Labs Reviewed  COMPREHENSIVE METABOLIC PANEL - Abnormal; Notable for the following:       Result Value   Glucose, Bld 119 (*)    Calcium 8.8 (*)    Total Protein 6.4 (*)    Albumin 3.2 (*)    All other components within normal limits  CBC WITH DIFFERENTIAL/PLATELET - Abnormal; Notable for the following:    WBC 11.1 (*)    All other components within normal limits  D-DIMER, QUANTITATIVE (NOT AT [facility])    EKG  EKG Interpretation None       Radiology Dg Chest 2 View  Result Date: 12/28/2016 CLINICAL DATA:  Initial evaluation for acute cough, hemoptysis. EXAM: CHEST  2 VIEW COMPARISON:  Prior radiograph from 02/12/2016. FINDINGS: Cardiac and mediastinal silhouettes are stable in size and contour, and remain within normal limits. Lungs mildly hypoinflated. Scattered linear opacities within the perihilar regions and right lung base are similar to previous, likely reflective of scarring. No focal infiltrates. Scattered bronchitic changes present. No pulmonary edema or pleural effusion. No pneumothorax. No acute osseus abnormality. IMPRESSION: 1. Scattered bronchitic changes with superimposed bilateral linear scarring. 2. No other active cardiopulmonary disease. Electronically Signed   By: Jeannine Boga M.D.   On: 12/28/2016 22:53    Procedures Procedures (including critical care time)  Medications Ordered in ED Medications  ipratropium-albuterol (DUONEB) 0.5-2.5 (3) MG/3ML nebulizer solution 3 mL (3 mLs  Nebulization Given 12/29/16 0414)  albuterol (PROVENTIL) (2.5 MG/3ML) 0.083% nebulizer solution 2.5 mg (2.5 mg Nebulization Given 12/29/16 0414)  methylPREDNISolone sodium succinate (SOLU-MEDROL) 125 mg/2 mL injection 125 mg (125 mg Intravenous Given 12/29/16 0414)     Initial Impression / Assessment and Plan / ED Course  I have reviewed the triage vital signs and the nursing notes.  Pertinent labs & imaging results that were available during my care of the patient were reviewed by me and considered  in my medical decision making (see chart for details).     Patient presents to the emergency department for evaluation of difficulty breathing. Patient reports that she has recently been diagnosed with COPD. She has had cough that was initially productive of grayish and green sputum, tonight turned bloody. Upon examination she was tachypnea and wheezing throughout. Chest x-ray does not show evidence of pneumonia. Blood work normal. This includes a normal d-dimer. Patient's bronchospasm is enough to explain the patient's shortness of breath, no concern for PE with normal d-dimer.   Patient continues to be short of breath. She was given bronchodilator therapy with improvement of her bronchospasm, but is still person with breathing and short of breath. She was ambulated in the hallway. She did not have any significant desaturations, but heart rate did go up and she was extremely short of breath. Patient will require hospitalization for further management.  Final Clinical Impressions(s) / ED Diagnoses   Final diagnoses:  Cough with hemoptysis  Chronic obstructive pulmonary disease, unspecified COPD type ([place])    New Prescriptions New Prescriptions   No medications on file    I personally performed the services described in this documentation, which was scribed in my presence. The recorded information has been reviewed and is accurate.     [name], MD 12/29/16 [phone]

## 2016-12-29 NOTE — ED Notes (Signed)
Pt placed on 2L requested by admitting MD

## 2016-12-29 NOTE — ED Notes (Signed)
Admitting MD at bedside.

## 2016-12-29 NOTE — ED Notes (Signed)
Ambulated pt in hallway while on Pulse Ox without 02. Pt stayed between 95%-100% while walking from A[address] to the [place].

## 2016-12-29 NOTE — H&P (Signed)
History and Physical    Elizabeth Diaz KGY:[id number] DOB: 12/01/1965 DOA: 12/29/2016  PCP: Carmie Kanner, NP   Patient coming from: home  Chief Complaint: worsening SOB, cough with bloody sputum  HPI: Elizabeth Diaz is a 51 y.o. female with medical history significant for recently diagnosed COPD, CHF with preserved EF, PAD with right SFA PTI, Hyperlipidemia, HTN, GERD, OSA - not on CPAP d/t loss insurance coverage, seizure disorder, arthritis with chronic pain, diverticulosis, Schatzki's ring, pneumothorax, obesity who presented to the ED with c/o of increasing SOB. Patient said that her SOB has been going on for more than a year and she was diagnosed with "lungs spots" and has an upcoming appointment with pulmonologist at the [facility] clinic  in June  Patient reported approximately 2 days ago she developed severe shaking chills and feeling hot and cold to her bones, cough productive of greenish thick sputum and shortness of breath. His symptoms were getting worse to the point that she felt dyspneic even at rest and last night patient developed large amount of blood in the sputum prior to presentation to the ED. She also c/o some chest discomfort and feeling of dizziness and weakness. She denied sore throrat, myalgia, fevers, chest congestion  ED Course: in the ED patient was afebrile, slightly tachycardic with HR 105, and tachypneic with RR 25,  chest x-ray showed scattered bronchitic changes with superimposed bilateral linear scarring in no other acute cardiopulmonary disease.  Blood work demonstrated mild leukocytosis with white blood cells count 11,100, renal function was normal, otherwise unremarkable.  Review of Systems: As per HPI otherwise 10 point review of systems negative.   Ambulatory Status:Uses cane  Past Medical History:  Diagnosis Date  . Arthritis   . Chronic pain   . Diverticulosis   . HTN (hypertension) 12/29/2016  . Hyperplastic colon polyp   . Numbness    left leg  .  Obesity   . Pneumothorax   . Schatzki's ring   . Seizures ([facility])    hx of, no seizures last few years    Past Surgical History:  Procedure Laterality Date  . CHEST TUBE INSERTION    . COLONOSCOPY WITH PROPOFOL N/A 11/01/2014   Procedure: COLONOSCOPY WITH PROPOFOL;  Surgeon: Jerene Bears, MD;  [facility];  Service: Gastroenterology;  Laterality: N/A;  . ESOPHAGOGASTRODUODENOSCOPY (EGD) WITH PROPOFOL N/A 07/29/2015   Procedure: ESOPHAGOGASTRODUODENOSCOPY (EGD) WITH PROPOFOL;  Surgeon: Jerene Bears, MD;  [facility];  Service: Gastroenterology;  Laterality: N/A;  . PERIPHERAL VASCULAR CATHETERIZATION N/A 01/07/2016   Procedure: Lower Extremity Angiography;  Surgeon: Adrian Prows, MD;  Location: [place] [facility];  Service: Cardiovascular;  Laterality: N/A;  . PERIPHERAL VASCULAR CATHETERIZATION Right 02/25/2016   Procedure: Peripheral Vascular Atherectomy;  Surgeon: Adrian Prows, MD;  Location: [facility];  Service: Cardiovascular;  Laterality: Right;  SFA ATHERECTOMY/DRUG COATED PTA  . TUBAL LIGATION      Social History   Social History  . Marital status: Single    Spouse name: N/A  . Number of children: 2  . Years of education: N/A   Occupational History  . [employer]    Social History Main Topics  . Smoking status: Former Smoker    Packs/day: 1.50    Years: 0.00    Types: Cigarettes    Quit date: 05/04/2009  . Smokeless tobacco: Never Used  . Alcohol use Yes     Comment: occasionally  . Drug use: No  Comment: 20 year history of crack cocaine. Haven't used any in two years.   . Sexual activity: Not on file   Other Topics Concern  . Not on file   Social History Narrative   Lives in [place]    Works in [employer] group)   Hobbies: listening to music. Bowling.     No Known Allergies  Family History  Problem Relation Age of Onset  . Heart failure Father   . Diabetes Father   . Diabetes Maternal Grandmother   .  Diabetes Paternal Grandfather   . Cancer Maternal Grandfather     unknown type    Prior to Admission medications   Medication Sig Start Date End Date Taking? Authorizing Provider  amitriptyline (ELAVIL) 75 MG tablet Take 100 mg by mouth at bedtime.    Yes Historical Provider, MD  aspirin 81 MG chewable tablet Chew 81 mg by mouth daily.   Yes Historical Provider, MD  atorvastatin (LIPITOR) 20 MG tablet Take 20 mg by mouth daily.   Yes Historical Provider, MD  clopidogrel (PLAVIX) 75 MG tablet Take 1 tablet (75 mg total) by mouth daily. 02/25/16  Yes Adrian Prows, MD  colestipol (COLESTID) 1 g tablet Take 2 tablets (2 g total) by mouth 2 (two) times daily. 03/16/16  Yes Jerene Bears, MD  ferrous sulfate 325 (65 FE) MG tablet Take 2 tablets by mouth every other day.   Yes Historical Provider, MD  furosemide (LASIX) 40 MG tablet Take 1 tablet (40 mg total) by mouth daily as needed for edema. 12/12/15  Yes Iline Oven, MD  gabapentin (NEURONTIN) 300 MG capsule Take 2-3 capsules (600-900 mg total) by mouth 3 (three) times daily. 600mg  in the morning, 600mg  in the afternoon.  900mg  in the evening. Patient taking differently: Take 900 mg by mouth 3 (three) times daily.  02/21/16  Yes Frazier Richards, MD  HYDROcodone-acetaminophen (NORCO/VICODIN) 5-325 MG tablet Take 1 tablet by mouth every 6 (six) hours as needed for moderate pain. 02/21/16  Yes Frazier Richards, MD  loperamide (IMODIUM A-D) 2 MG tablet Take 1 tablet (2 mg total) by mouth 3 (three) times daily. Patient taking differently: Take 2 mg by mouth 4 (four) times daily as needed for diarrhea or loose stools.  02/11/16  Yes Jerene Bears, MD  pantoprazole (PROTONIX) 20 MG tablet Take 20 mg by mouth daily.   Yes Historical Provider, MD  tiZANidine (ZANAFLEX) 4 MG tablet Take 1-2 tablets (4-8 mg total) by mouth every 6 (six) hours as needed for muscle spasms. 02/21/16  Yes Frazier Richards, MD  capsaicin (ZOSTRIX) 0.025 % cream Apply topically 2 (two) times  daily. Patient not taking: Reported on 12/29/2016 12/28/16   Trula Slade, DPM  ciclopirox [facility]) 8 % solution Apply topically at bedtime. Apply over nail and surrounding skin. Apply daily over previous coat. After seven (7) days, may remove with alcohol and continue cycle. Patient not taking: Reported on 12/29/2016 12/28/16   Trula Slade, DPM    Physical Exam: Vitals:   12/29/16 0700 12/29/16 0715 12/29/16 0730 12/29/16 0800  BP: 125/66 125/79 135/74 134/69  Pulse: (!) 103 (!) 101 (!) 103 (!) 101  Resp:  (!) 25 (!) 26 (!) 29  Temp:      TempSrc:      SpO2: 97% 96% 97% 98%  Weight:      Height:         General: Appears in mild  respiratory distress unable to speak in full sentences and panting  Eyes: PERRLA, EOMI, normal lids, iris ENT:  grossly normal hearing, lips & tongue, mucous membranes moist and intact Neck: no lymphoadenopathy, masses or thyromegaly Cardiovascular: RRR, no m/r/g. No JVD, carotid bruits. 2+ pittingLE edema.  Respiratory: bilateral no wheezes,but coarse breath sound with poor air exchange. Increased respiratory efforts with SOB at rest when speaks. No accessory muscle use observed Abdomen: soft, non-tender, non-distended, no organomegaly or masses appreciated. BS present in all quadrants Skin: no rash, ulcers or induration seen on limited exam, skin warm, sweaty Musculoskeletal: grossly normal tone BUE/BLE, good ROM, no bony abnormality or joint deformities observed Psychiatric: grossly normal mood and affect, speech fluent and appropriate, alert and oriented x3 Neurologic: CN II-XII grossly intact, moves all extremities in coordinated fashion, sensation intact   Labs on Admission: I have personally reviewed following labs and imaging studies  CBC, BMP  GFR: Estimated Creatinine Clearance: 103.4 mL/min (by C-G formula based on SCr of 0.71 mg/dL).   Creatinine Clearance: Estimated Creatinine Clearance: 103.4 mL/min (by C-G formula based on SCr of  0.71 mg/dL).    Radiological Exams on Admission: Dg Chest 2 View  Result Date: 12/28/2016 CLINICAL DATA:  Initial evaluation for acute cough, hemoptysis. EXAM: CHEST  2 VIEW COMPARISON:  Prior radiograph from 02/12/2016. FINDINGS: Cardiac and mediastinal silhouettes are stable in size and contour, and remain within normal limits. Lungs mildly hypoinflated. Scattered linear opacities within the perihilar regions and right lung base are similar to previous, likely reflective of scarring. No focal infiltrates. Scattered bronchitic changes present. No pulmonary edema or pleural effusion. No pneumothorax. No acute osseus abnormality. IMPRESSION: 1. Scattered bronchitic changes with superimposed bilateral linear scarring. 2. No other active cardiopulmonary disease. Electronically Signed   By: Jeannine Boga M.D.   On: 12/28/2016 22:53    EKG: not found  Assessment/Plan Active Problems:   Gastroesophageal reflux disease without esophagitis   OSA (obstructive sleep apnea)   COPD (chronic obstructive pulmonary disease) (HCC)   HTN (hypertension)   Hyperlipidemia   GERD (gastroesophageal reflux disease)   Hemoptysis with ongoing SOB in setting of newly diagnosed COPD Will check ABG and Chest CT to r/o PE  Continue supplemental O2, nebulier therapy as needed Patient was given IV steroid and nebulizer treatment on arrival tro the EDl for wheezing Consider Levaquin if chest CTPA is negative for PE  PAD - s/p right SFA PTI Continue Plavix, aspirin  and statin  HTN - suboptimally controlled Restart blood pressure meds and adjust doses as needed  CHF with preserved EF Will check  Echocardiogram , EKG, BNP and cycle troponion Will give a dose of IV Lasix 60 mg today and start oral Lasix tomorrow  OSA - will place patient on CPAP at night  while in the hospital  Hyperlipidemia - obtain fasting lipid panel Continue Lipitor and Colestipol  GERD Continue PPI    DVT prophylaxis:  heparin Code Status: full Family Communication: none Disposition Plan: telemetry Consults called: none Admission status: observation   York Grice, PA-C Pager: [phone] [facility]  If 7PM-7AM, please contact night-coverage [email or web address] Password [email or web address]  12/29/2016, 8:20 AM

## 2016-12-29 NOTE — Progress Notes (Signed)
This is a no charge note  51 year old lady with past medical history of recently diagnosed with COPD, hyperlipidemia, GERD, seizure, Schatzki's ring, pneumothorax, diverticulosis, anxiety, who presents with hemoptysis and productive cough with yellow colored sputum production. Patient had one episode of hemoptysis with small amount of blood.  Patient denies chest pain or shortness of breath. Patient has wheezing on auscultation per ED physician. Chest x-ray showed scattered bronchitic change, no infiltration. WBC 11.1, electrolytes renal function okay, negative d-dimer, temperature normal, tachycardia, tachypnea, oxygen saturation 95% on room air. Clinically seems to be due to COPD exacerbation per EDP.  Ivor Costa, MD  [facility] Pager [phone]  If 7PM-7AM, please contact night-coverage [email or web address] Password [facility] 12/29/2016, 6:39 AM

## 2016-12-29 NOTE — ED Notes (Signed)
Patient transported to CT 

## 2016-12-30 DIAGNOSIS — G4733 Obstructive sleep apnea (adult) (pediatric): Secondary | ICD-10-CM

## 2016-12-30 DIAGNOSIS — J441 Chronic obstructive pulmonary disease with (acute) exacerbation: Secondary | ICD-10-CM | POA: Diagnosis present

## 2016-12-30 LAB — BASIC METABOLIC PANEL
ANION GAP: 8 (ref 5–15)
BUN: 10 mg/dL (ref 6–20)
CALCIUM: 8.9 mg/dL (ref 8.9–10.3)
CO2: 28 mmol/L (ref 22–32)
Chloride: 102 mmol/L (ref 101–111)
Creatinine, Ser: 0.75 mg/dL (ref 0.44–1.00)
GFR calc non Af Amer: >60 mL/min (ref >60)
Glucose, Bld: 172 mg/dL — ABNORMAL HIGH (ref 65–99)
POTASSIUM: 4.1 mmol/L (ref 3.5–5.1)
Sodium: 138 mmol/L (ref 135–145)

## 2016-12-30 LAB — CBC
HEMATOCRIT: 38.3 % (ref 36.0–46.0)
HEMOGLOBIN: 12.3 g/dL (ref 12.0–15.0)
MCH: 29.1 pg (ref 26.0–34.0)
MCHC: 32.1 g/dL (ref 30.0–36.0)
MCV: 90.8 fL (ref 78.0–100.0)
Platelets: 350 10*3/uL (ref 150–400)
RBC: 4.22 MIL/uL (ref 3.87–5.11)
RDW: 12.9 % (ref 11.5–15.5)
WBC: 18.3 10*3/uL — ABNORMAL HIGH (ref 4.0–10.5)

## 2016-12-30 LAB — HIV ANTIBODY (ROUTINE TESTING W REFLEX): HIV Screen 4th Generation wRfx: NONREACTIVE

## 2016-12-30 MED ORDER — ORAL CARE MOUTH RINSE
15.0000 mL | Freq: Two times a day (BID) | OROMUCOSAL | Status: DC
  Administered 2016-12-30 – 2017-01-01 (×4): 15 mL via OROMUCOSAL

## 2016-12-30 MED ORDER — ENOXAPARIN SODIUM 40 MG/0.4ML ~~LOC~~ SOLN: 40.0000 mg | SUBCUTANEOUS | Status: DC

## 2016-12-30 MED ORDER — IPRATROPIUM-ALBUTEROL 0.5-2.5 (3) MG/3ML IN SOLN
3.0000 mL | Freq: Three times a day (TID) | RESPIRATORY_TRACT | Status: DC
  Administered 2016-12-31: 3 mL via RESPIRATORY_TRACT
  Filled 2016-12-30: qty 3

## 2016-12-30 MED ORDER — METHYLPREDNISOLONE SODIUM SUCC 125 MG IJ SOLR
60.0000 mg | Freq: Two times a day (BID) | INTRAMUSCULAR | Status: DC
  Administered 2016-12-30 – 2017-01-01 (×5): 60 mg via INTRAVENOUS
  Filled 2016-12-30 (×5): qty 2

## 2016-12-30 NOTE — Progress Notes (Signed)
RT placed patient on CPAP with 2L O2 bled into circuit. Patient tolerating well at this time. RT will monitor as needed. 

## 2016-12-30 NOTE — Evaluation (Signed)
Physical Therapy Evaluation Patient Details Name: Elizabeth Diaz MRN: [id number] DOB: 01/21/1966 Today's Date: 12/30/2016   History of Present Illness   51 y.o. female with medical history significant for recently diagnosed COPD, CHF with preserved EF, PAD with right SFA PTI, Hyperlipidemia, HTN, GERD, OSA - not on CPAP d/t loss insurance coverage, seizure disorder, arthritis with chronic pain, diverticulosis, Schatzki's ring, pneumothorax, obesity who presented to the ED with c/o of increasing SOB  Clinical Impression  Pt admitted with above diagnosis. Pt currently with functional limitations due to the deficits listed below (see PT Problem List). Pt mobility limited by body habitus and cardiopulmonary status. Pt able to amb 150' with cane however 4/4 on DOE scale. Pt will benefit from skilled PT to increase their independence and safety with mobility to allow discharge to the venue listed below.       Follow Up Recommendations No PT follow up;Supervision - Intermittent    Equipment Recommendations  None recommended by PT    Recommendations for Other Services       Precautions / Restrictions Precautions Precautions: Fall Precaution Comments: h/o falls Restrictions Weight Bearing Restrictions: No      Mobility  Bed Mobility               General bed mobility comments: pt up on BSC  Transfers Overall transfer level: Needs assistance Equipment used: None Transfers: Sit to/from Stand Sit to Stand: Supervision         General transfer comment: pt with good technique  Ambulation/Gait Ambulation/Gait assistance: Min guard Ambulation Distance (Feet): 150 Feet Assistive device: Straight cane Gait Pattern/deviations: Step-to pattern;Decreased stride length;Wide base of support Gait velocity: slow Gait velocity interpretation: Below normal speed for age/gender General Gait Details: pt with  R knee instability and dependence on cane due to quick fatigue and SOB. SPO2  >89% on RA. Pt required 3 standing rest breaks due to dizziness and lightheadedness. HR 104-110. Adjusted cane for optimal support. Pt reaching and using hallway rail last 50 feet of ambulation  Stairs            Wheelchair Mobility    Modified Rankin (Stroke Patients Only)       Balance Overall balance assessment: History of Falls;Needs assistance Sitting-balance support: Feet supported;No upper extremity supported Sitting balance-Leahy Scale: Good     Standing balance support: During functional activity Standing balance-Leahy Scale: Fair Standing balance comment: pt stood at sink to perform hygiene, had to hold onto sink to perform pericare                             Pertinent Vitals/Pain Pain Assessment: No/denies pain    Home Living Family/patient expects to be discharged to:: Private residence Living Arrangements: Alone Available Help at Discharge: Family;Available PRN/intermittently Type of Home: Apartment Home Access: Level entry     Home Layout: One level Home Equipment: Cane - single point;Walker - 2 wheels;Grab bars - tub/shower      Prior Function Level of Independence: Independent         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   Dominant Hand: Left    Extremity/Trunk Assessment   Upper Extremity Assessment Upper Extremity Assessment: Overall WFL for tasks assessed    Lower Extremity Assessment Lower Extremity Assessment: Overall WFL for tasks assessed    Cervical / Trunk Assessment Cervical / Trunk Assessment: Normal  Communication   Communication: No  difficulties  Cognition Arousal/Alertness: Awake/alert Behavior During Therapy: WFL for tasks assessed/performed Overall Cognitive Status: Within Functional Limits for tasks assessed                                        General Comments      Exercises     Assessment/Plan    PT Assessment Patient needs continued PT services  PT  Problem List Cardiopulmonary status limiting activity;Decreased strength;Decreased balance;Decreased mobility;Decreased activity tolerance       PT Treatment Interventions DME instruction;Gait training;Functional mobility training;Therapeutic activities;Therapeutic exercise;Balance training    PT Goals (Current goals can be found in the Care Plan section)  Acute Rehab PT Goals Patient Stated Goal: home PT Goal Formulation: With patient Time For Goal Achievement: 01/06/17 Potential to Achieve Goals: Good    Frequency Min 2X/week   Barriers to discharge Decreased caregiver support alone    Co-evaluation               End of Session   Activity Tolerance: Patient tolerated treatment well Patient left: in chair;with call bell/phone within reach Nurse Communication: Mobility status PT Visit Diagnosis: Difficulty in walking, not elsewhere classified (R26.2)    Time: 8916-9450 PT Time Calculation (min) (ACUTE ONLY): 28 min   Charges:   PT Evaluation $PT Eval Low Complexity: 1 Procedure PT Treatments $Gait Training: 8-22 mins   PT G Codes:       Kittie Plater, PT, DPT Pager #: [phone] Office #: [phone]  [place] 12/30/2016, 4:39 PM

## 2016-12-30 NOTE — Progress Notes (Signed)
PROGRESS NOTE    Elizabeth Diaz  VQQ:[id number] DOB: 1966-04-25 DOA: 12/29/2016 PCP: Carmie Kanner, NP   Brief Narrative: 51 y.o. female with medical history significant for recently diagnosed COPD, CHF with preserved EF, PAD with right SFA PTI, Hyperlipidemia, HTN, GERD, OSA - not on CPAP d/t loss insurance coverage, seizure disorder, arthritis with chronic pain, diverticulosis, Schatzki's ring, pneumothorax, obesity who presented to the ED with c/o of increasing SOB. Patient said that her SOB has been going on for more than a year and she was diagnosed with "lungs spots" and has an upcoming appointment with pulmonologist at the [facility] clinic  in June.  ED Course: in the ED patient was afebrile, slightly tachycardic with HR 105, and tachypneic with RR 25,  chest x-ray showed scattered bronchitic changes with superimposed bilateral linear scarring in no other acute cardiopulmonary disease.  Blood work demonstrated mild leukocytosis with white blood cells count 11,100, renal function was normal, otherwise unremarkable  Assessment & Plan:  #Possible acute COPD exacerbation: Patient with shortness of breath and hemoptysis on admission. CT scan of the chest negative for PE. -Continue Solu-Medrol, doxycycline, bronchodilators  -Patient is on 2 L of oxygen but patient is not hypoxic.wean to room air.  -No hemoptysis this morning.  #Obstructive sleep apnea: Continue CPAP. Recommended outpatient follow-up with PCP and pulmonologist.  #History of chronic CHF with preserved EF: Patient is not on acute CHF exacerbation. BNP and troponin unremarkable. Resume home dose of Lasix.  # Hypertension:  #PAD - s/p right SFA PTI Continue Plavix, aspirin  and statin  Hyperlipidemia: Continue statin. Morbid obesity Acid reflux: Continue PPI  Active Problems:   Gastroesophageal reflux disease without esophagitis   OSA (obstructive sleep apnea)   COPD (chronic obstructive pulmonary disease) (HCC)   HTN  (hypertension)   Hyperlipidemia   GERD (gastroesophageal reflux disease)  DVT prophylaxis:lovenox sq Code Status:full Family Communication: No family present at bedside Disposition Plan: Likely discharge home in 1-2 days    Consultants:   None  Procedures: None   Antimicrobials:doxycycline since 4/3  Subjective: Patient was seen and examined at bedside. Combivent shortness of breath and cough. She has wheezing. No nausea vomiting or chest pain.  Objective: Vitals:   12/30/16 0653 12/30/16 0826 12/30/16 1141 12/30/16 1449  BP:    138/70  Pulse:    100  Resp:    18  Temp:    (!) 92.2 F (33.4 C)  TempSrc:      SpO2:  97% 97% 98%  Weight: 125.9 kg (277 lb 9 oz)     Height:        Intake/Output Summary (Last 24 hours) at 12/30/16 1501 Last data filed at 12/30/16 1032  Gross per 24 hour  Intake              880 ml  Output              800 ml  Net               80 ml   Filed Weights   12/28/16 2200 12/29/16 0934 12/30/16 0653  Weight: 128.4 kg (283 lb) 126.2 kg (278 lb 4.8 oz) 125.9 kg (277 lb 9 oz)    Examination:  General exam: Appears calm and comfortable  Respiratory system: Bilateral diffuse expiratory wheeze. Respiratory effort normal.  Cardiovascular system: S1 & S2 heard, RRR.  No pedal edema. Gastrointestinal system: Abdomen is nondistended, soft and nontender. Normal bowel sounds heard. Central nervous system: Alert  and oriented. No focal neurological deficits. Extremities: Symmetric 5 x 5 power. Skin: No rashes, lesions or ulcers Psychiatry: Judgement and insight appear normal. Mood & affect appropriate.     Data Reviewed: I have personally reviewed following labs and imaging studies  CBC:  Recent Labs Lab 12/28/16 2210 12/30/16 0557  WBC 11.1* 18.3*  NEUTROABS 7.0  --   HGB 12.1 12.3  HCT 37.3 38.3  MCV 89.0 90.8  PLT 335 161   Basic Metabolic Panel:  Recent Labs Lab 12/28/16 2210 12/30/16 0557  NA 138 138  K 4.3 4.1  CL 103  102  CO2 29 28  GLUCOSE 119* 172*  BUN 6 10  CREATININE 0.71 0.75  CALCIUM 8.8* 8.9   GFR: Estimated Creatinine Clearance: 102 mL/min (by C-G formula based on SCr of 0.75 mg/dL). Liver Function Tests:  Recent Labs Lab 12/28/16 2210  AST 21  ALT 29  ALKPHOS 75  BILITOT 0.5  PROT 6.4*  ALBUMIN 3.2*   No results for input(s): LIPASE, AMYLASE in the last 168 hours. No results for input(s): AMMONIA in the last 168 hours. Coagulation Profile:  Recent Labs Lab 12/29/16 0712  INR 1.07   Cardiac Enzymes:  Recent Labs Lab 12/29/16 0943 12/29/16 1412 12/29/16 2028  TROPONINI <0.03 <0.03 <0.03   BNP (last 3 results) No results for input(s): PROBNP in the last 8760 hours. HbA1C: No results for input(s): HGBA1C in the last 72 hours. CBG: No results for input(s): GLUCAP in the last 168 hours. Lipid Profile: No results for input(s): CHOL, HDL, LDLCALC, TRIG, CHOLHDL, LDLDIRECT in the last 72 hours. Thyroid Function Tests:  Recent Labs  12/29/16 0943  TSH 1.183   Anemia Panel: No results for input(s): VITAMINB12, FOLATE, FERRITIN, TIBC, IRON, RETICCTPCT in the last 72 hours. Sepsis Labs: No results for input(s): PROCALCITON, LATICACIDVEN in the last 168 hours.  Recent Results (from the past 240 hour(s))  MRSA PCR Screening     Status: None   Collection Time: 12/29/16  4:23 PM  Result Value Ref Range Status   MRSA by PCR NEGATIVE NEGATIVE Final    Comment:        The GeneXpert MRSA Assay (FDA approved for NASAL specimens only), is one component of a comprehensive MRSA colonization surveillance program. It is not intended to diagnose MRSA infection nor to guide or monitor treatment for MRSA infections.          Radiology Studies: Dg Chest 2 View  Result Date: 12/28/2016 CLINICAL DATA:  Initial evaluation for acute cough, hemoptysis. EXAM: CHEST  2 VIEW COMPARISON:  Prior radiograph from 02/12/2016. FINDINGS: Cardiac and mediastinal silhouettes are  stable in size and contour, and remain within normal limits. Lungs mildly hypoinflated. Scattered linear opacities within the perihilar regions and right lung base are similar to previous, likely reflective of scarring. No focal infiltrates. Scattered bronchitic changes present. No pulmonary edema or pleural effusion. No pneumothorax. No acute osseus abnormality. IMPRESSION: 1. Scattered bronchitic changes with superimposed bilateral linear scarring. 2. No other active cardiopulmonary disease. Electronically Signed   By: Jeannine Boga M.D.   On: 12/28/2016 22:53   Ct Angio Chest Pe W Or Wo Contrast  Result Date: 12/29/2016 CLINICAL DATA:  Shortness of breath, chest pain for 24 hours, cough for 2 days, possible pulmonary embolus. EXAM: CT ANGIOGRAPHY CHEST WITH CONTRAST TECHNIQUE: Multidetector CT imaging of the chest was performed using the standard protocol during bolus administration of intravenous contrast. Multiplanar CT image reconstructions and MIPs were  obtained to evaluate the vascular anatomy. CONTRAST:  100 cc Isovue COMPARISON:  CT scan 12/04/2015 FINDINGS: Cardiovascular: Cardiomediastinal silhouette is stable. No pericardial effusion. No aortic aneurysm. No central pulmonary embolus is noted. Suboptimal study due to extensive artifacts from patient's large body habitus. Mediastinum/Nodes: Small hiatal hernia is noted. No mediastinal or hilar adenopathy. Lungs/Pleura: Images of the lung parenchyma shows no infiltrate or pulmonary edema. Mild dependent atelectasis noted bilateral lower lobe posteriorly. Stable bandlike linear atelectasis bilateral lower lobe posteriorly. No pneumothorax. No bronchiectasis. Upper Abdomen: There is fatty infiltration of the liver. No acute findings in visualized upper abdomen. Musculoskeletal: Sagittal images of the spine shows degenerative changes thoracic spine. Small nonspecific bilateral axillary lymph nodes. Review of the MIP images confirms the above  findings. IMPRESSION: 1. No central pulmonary embolus is noted. Suboptimal study due to extensive artifacts from patient's large body habitus. 2. No segmental infiltrate or pulmonary edema. Mild dependent atelectasis bilateral lower lobe posteriorly. Stable bandlike atelectasis bilateral lower lobe. 3. Degenerative changes thoracic spine. 4. No mediastinal hematoma or adenopathy.  No aortic aneurysm. Electronically Signed   By: Lahoma Crocker M.D.   On: 12/29/2016 09:05        Scheduled Meds: . amitriptyline  100 mg Oral QHS  . aspirin  81 mg Oral Daily  . atorvastatin  20 mg Oral Daily  . clopidogrel  75 mg Oral Daily  . colestipol  2 g Oral BID  . dextromethorphan-guaiFENesin  1 tablet Oral BID  . doxycycline (VIBRAMYCIN) IV  100 mg Intravenous Q12H  . ferrous sulfate  650 mg Oral QODAY  . furosemide  40 mg Oral Daily  . gabapentin  900 mg Oral TID  . heparin  5,000 Units Subcutaneous Q8H  . ipratropium-albuterol  3 mL Nebulization Q4H  . mouth rinse  15 mL Mouth Rinse BID  . methylPREDNISolone (SOLU-MEDROL) injection  60 mg Intravenous Q12H  . pantoprazole  20 mg Oral Daily  . sodium chloride flush  3 mL Intravenous Q12H   Continuous Infusions:   LOS: 0 days    Dron Tanna Furry, MD [facility] Pager [phone]  If 7PM-7AM, please contact night-coverage [email or web address] Password [email or web address] 12/30/2016, 3:01 PM

## 2016-12-31 ENCOUNTER — Inpatient Hospital Stay (HOSPITAL_COMMUNITY): Payer: Self-pay

## 2016-12-31 DIAGNOSIS — J441 Chronic obstructive pulmonary disease with (acute) exacerbation: Principal | ICD-10-CM

## 2016-12-31 LAB — CBC
HCT: 38.3 % (ref 36.0–46.0)
Hemoglobin: 12.4 g/dL (ref 12.0–15.0)
MCH: 29.7 pg (ref 26.0–34.0)
MCHC: 32.4 g/dL (ref 30.0–36.0)
MCV: 91.6 fL (ref 78.0–100.0)
PLATELETS: 383 10*3/uL (ref 150–400)
RBC: 4.18 MIL/uL (ref 3.87–5.11)
RDW: 12.9 % (ref 11.5–15.5)
WBC: 18.3 10*3/uL — AB (ref 4.0–10.5)

## 2016-12-31 LAB — ECHOCARDIOGRAM COMPLETE
HEIGHTINCHES: 60 in
Weight: 4534.4 oz

## 2016-12-31 MED ORDER — LEVOFLOXACIN IN D5W 750 MG/150ML IV SOLN
750.0000 mg | INTRAVENOUS | Status: DC
  Administered 2016-12-31: 750 mg via INTRAVENOUS
  Filled 2016-12-31 (×2): qty 150

## 2016-12-31 MED ORDER — IPRATROPIUM-ALBUTEROL 0.5-2.5 (3) MG/3ML IN SOLN
3.0000 mL | Freq: Two times a day (BID) | RESPIRATORY_TRACT | Status: DC
  Administered 2016-12-31 – 2017-01-01 (×2): 3 mL via RESPIRATORY_TRACT
  Filled 2016-12-31 (×2): qty 3

## 2016-12-31 MED ORDER — ENOXAPARIN SODIUM 60 MG/0.6ML ~~LOC~~ SOLN
60.0000 mg | SUBCUTANEOUS | Status: DC
  Administered 2016-12-31: 18:00:00 60 mg via SUBCUTANEOUS
  Filled 2016-12-31: qty 0.6

## 2016-12-31 MED ORDER — WHITE PETROLATUM GEL
Status: AC
Start: 2016-12-31 — End: 2016-12-31
  Administered 2016-12-31: 1
  Filled 2016-12-31: qty 1

## 2016-12-31 MED ORDER — DOXYCYCLINE HYCLATE 100 MG PO TABS
100.0000 mg | ORAL_TABLET | Freq: Two times a day (BID) | ORAL | Status: DC
  Administered 2016-12-31: 100 mg via ORAL
  Filled 2016-12-31: qty 1

## 2016-12-31 NOTE — Progress Notes (Signed)
  Echocardiogram 2D Echocardiogram has been performed.  Monta Police L Androw 12/31/2016, 2:11 PM

## 2016-12-31 NOTE — Progress Notes (Addendum)
Pharmacy Antibiotic Note  Elizabeth Diaz is a 51 y.o. female admitted on 12/29/2016 with pneumonia.  Pharmacy has been consulted for levaquin dosing. Previously on doxy for COPD exacerbation, last dose 4/5 at 0210. Afebrile, WBC stable 18.3, CrCl>100.  Plan: Levaquin 750mg  IV q24h Monitor clinical progress, c/s, renal function F/u de-escalation plan/LOT   Height: 5' (152.4 cm) Weight: 283 lb 6.4 oz (128.5 kg) IBW/kg (Calculated) : 45.5  Temp (24hrs), Avg:97.5 F (36.4 C), Min:97.2 F (36.2 C), Max:97.9 F (36.6 C)   Recent Labs Lab 12/28/16 2210 12/30/16 0557 12/31/16 0526  WBC 11.1* 18.3* 18.3*  CREATININE 0.71 0.75  --     Estimated Creatinine Clearance: 103.4 mL/min (by C-G formula based on SCr of 0.75 mg/dL).    No Known Allergies  Elicia Lamp, PharmD, BCPS Clinical Pharmacist 12/31/2016 3:29 PM

## 2016-12-31 NOTE — Evaluation (Signed)
Occupational Therapy Evaluation Patient Details Name: Elizabeth Diaz MRN: [id number] DOB: 1966/04/07 Today's Date: 12/31/2016    History of Present Illness  51 y.o. female with medical history significant for recently diagnosed COPD, CHF with preserved EF, PAD with right SFA PTI, Hyperlipidemia, HTN, GERD, OSA - not on CPAP d/t loss insurance coverage, seizure disorder, arthritis with chronic pain, diverticulosis, Schatzki's ring, pneumothorax, obesity who presented to the ED with c/o of increasing SOB   Clinical Impression   Pt reports she was independent with ADL PTA. Currently pt overall supervision for ADL and functional mobility but with increased DOE (3/4) with activity. Began energy conservation strategies with pt and discussed use of shower chair for safety and energy conservation. Pt planning to d/c home alone with intermittent supervision from family. Pt would benefit from continued skilled OT to address established goals.    Follow Up Recommendations  No OT follow up;Supervision - Intermittent    Equipment Recommendations  Tub/shower seat    Recommendations for Other Services       Precautions / Restrictions Precautions Precautions: Fall Restrictions Weight Bearing Restrictions: No      Mobility Bed Mobility               General bed mobility comments: Pt OOB upon arrival  Transfers Overall transfer level: Needs assistance Equipment used: Straight cane Transfers: Sit to/from Stand Sit to Stand: Supervision         General transfer comment: Supervision for safety    Balance Overall balance assessment: History of Falls;Needs assistance Sitting-balance support: Feet supported;No upper extremity supported Sitting balance-Leahy Scale: Good     Standing balance support: During functional activity;Single extremity supported Standing balance-Leahy Scale: Fair                             ADL either performed or assessed with clinical  judgement   ADL Overall ADL's : Needs assistance/impaired Eating/Feeding: Independent;Sitting   Grooming: Supervision/safety;Standing   Upper Body Bathing: Set up;Sitting   Lower Body Bathing: Supervison/ safety;Sit to/from stand   Upper Body Dressing : Set up;Sitting   Lower Body Dressing: Supervision/safety;Sit to/from stand   Toilet Transfer: Supervision/safety;Ambulation (cane) Toilet Transfer Details (indicate cue type and reason): simulated by sit to stand from EOB with functional mobility in room         Functional mobility during ADLs: Supervision/safety;Cane General ADL Comments: Began education on energy conservation strategies and use of shower chair in tub for safety and energy conservation. Pt report 2 falls recently; one outside of home, one inside of home.     Vision         Perception     Praxis      Pertinent Vitals/Pain Pain Assessment: No/denies pain     Hand Dominance Left   Extremity/Trunk Assessment Upper Extremity Assessment Upper Extremity Assessment: Overall WFL for tasks assessed   Lower Extremity Assessment Lower Extremity Assessment: Defer to PT evaluation   Cervical / Trunk Assessment Cervical / Trunk Assessment: Normal   Communication Communication Communication: No difficulties   Cognition Arousal/Alertness: Awake/alert Behavior During Therapy: WFL for tasks assessed/performed Overall Cognitive Status: Within Functional Limits for tasks assessed                                     General Comments       Exercises  Shoulder Instructions      Home Living Family/patient expects to be discharged to:: Private residence Living Arrangements: Alone Available Help at Discharge: Family;Available PRN/intermittently Type of Home: Apartment Home Access: Level entry     Home Layout: One level     Bathroom Shower/Tub: Tub/shower unit;Curtain   Biochemist, clinical: Standard     Home Equipment: Cane - single  point;Walker - 2 wheels;Grab bars - tub/shower          Prior Functioning/Environment Level of Independence: Independent with assistive device(s)        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 reports she uses a cane for community mobility and RW for mobility in the house        OT Problem List: Decreased activity tolerance;Impaired balance (sitting and/or standing);Decreased knowledge of use of DME or AE;Cardiopulmonary status limiting activity;Obesity      OT Treatment/Interventions: Self-care/ADL training;Energy conservation;DME and/or AE instruction;Therapeutic activities;Patient/family education;Balance training    OT Goals(Current goals can be found in the care plan section) Acute Rehab OT Goals Patient Stated Goal: home OT Goal Formulation: With patient Time For Goal Achievement: 01/14/17 Potential to Achieve Goals: Good ADL Goals Pt Will Perform Tub/Shower Transfer: with modified independence;ambulating;shower seat;grab bars Additional ADL Goal #1: Pt will independently verbalize 3 energy conservation strategies and use during ADL. Additional ADL Goal #2: Pt will gather ADL items and complete UB/LB bathing/dressing at mod I level.  OT Frequency: Min 2X/week   Barriers to D/C: Decreased caregiver support  pt lives alone       Co-evaluation              End of Session Equipment Utilized During Treatment: Other (comment) (cane)  Activity Tolerance: Patient tolerated treatment well Patient left: in chair;with call bell/phone within reach  OT Visit Diagnosis: Unsteadiness on feet (R26.81);History of falling (Z91.81)                Time: 8546-2703 OT Time Calculation (min): 10 min Charges:  OT General Charges $OT Visit: 1 Procedure OT Evaluation $OT Eval Moderate Complexity: 1 Procedure G-Codes:     Jencarlo Bonadonna A. Ulice Brilliant, M.S., OTR/L Pager: [place] 12/31/2016, 11:45 AM

## 2016-12-31 NOTE — Progress Notes (Signed)
SATURATION QUALIFICATIONS: (This note is used to comply with regulatory documentation for home oxygen)  Patient Saturations on Room Air at Rest = 97%  Patient Saturations on Room Air while Ambulating = 95%  Patient Saturations on 2 Liters of oxygen while Ambulating = 98%  Please briefly explain why patient needs home oxygen:

## 2016-12-31 NOTE — Progress Notes (Signed)
MD informed about patient coughing a moderate amount of sputum, yellow, thick, with small spots of blood. Will continue to monitor.

## 2016-12-31 NOTE — Progress Notes (Addendum)
PROGRESS NOTE    Elizabeth Diaz  IWL:[id number] DOB: [birth year] DOA: 12/29/2016 PCP: Carmie Kanner, NP   Brief Narrative: 51 y.o. female with medical history significant for recently diagnosed COPD, CHF with preserved EF, PAD with right SFA PTI, Hyperlipidemia, HTN, GERD, OSA - not on CPAP d/t loss insurance coverage, seizure disorder, arthritis with chronic pain, diverticulosis, Schatzki's ring, pneumothorax, obesity who presented to the ED with c/o of increasing SOB. Patient said that her SOB has been going on for more than a year and she was diagnosed with "lungs spots" and has an upcoming appointment with pulmonologist at the [facility] clinic  in June.  ED Course: in the ED patient was afebrile, slightly tachycardic with HR 105, and tachypneic with RR 25,  chest x-ray showed scattered bronchitic changes with superimposed bilateral linear scarring in no other acute cardiopulmonary disease.  Blood work demonstrated mild leukocytosis with white blood cells count 11,100, renal function was normal, otherwise unremarkable  Assessment & Plan:  #Possible acute COPD exacerbation: Patient with shortness of breath and hemoptysis on admission. CT scan of the chest negative for PE. -Continue Solu-Medrol,  bronchodilators. Pt still SOB and coughing. Dc doxycyline and change to levaquin. MRSA screen negative, -Patient is on 2 L of oxygen but patient is not hypoxic.Don't know why she is on oxygen. Will consult respiratory therapist to wean to room air. Check ambulatory oxygen saturation. -reports yellowish cough and small spot of blood. Repeat chest x-ray in the morning.  #Obstructive sleep apnea: Continue CPAP. Recommended outpatient follow-up with PCP and pulmonologist.  #History of chronic CHF with preserved EF: Patient is not on acute CHF exacerbation. BNP and troponin unremarkable. Resume home dose of Lasix. -echo pending  # leukocytosis likely in the setting of steroid. Afebrile.  #  Hypertension:monitor BP  #PAD - s/p right SFA PTI Continue Plavix, aspirin  and statin  Hyperlipidemia: Continue statin. Morbid obesity Acid reflux: Continue PPI  Active Problems:   Gastroesophageal reflux disease without esophagitis   OSA (obstructive sleep apnea)   COPD (chronic obstructive pulmonary disease) (HCC)   HTN (hypertension)   Hyperlipidemia   GERD (gastroesophageal reflux disease)   COPD exacerbation ([place])  DVT prophylaxis:lovenox sq Code Status:full Family Communication: No family present at bedside Disposition Plan: Likely discharge home in 1-2 days    Consultants:   None  Procedures: None   Antimicrobials:doxycycline since 4/3.4/5 levaquin 4/5  Subjective: Patient was seen and examined at bedside. Still complaining of shortness of breath, cough with a spot of blood today. Denied chest pain, nausea or vomiting. Objective: Vitals:   12/30/16 2135 12/30/16 2326 12/31/16 0634 12/31/16 0828  BP: 133/72  (!) 148/87   Pulse: (!) 103 98 92   Resp: 18 18 18    Temp: 97.9 F (36.6 C)  97.4 F (36.3 C)   TempSrc: Oral  Oral   SpO2: 99% 98% 95% 100%  Weight:   128.5 kg (283 lb 6.4 oz)   Height:        Intake/Output Summary (Last 24 hours) at 12/31/16 1453 Last data filed at 12/31/16 1015  Gross per 24 hour  Intake              440 ml  Output                0 ml  Net              440 ml   Filed Weights   12/29/16 0934 12/30/16 1740 12/31/16 8144  Weight: 126.2 kg (278 lb 4.8 oz) 125.9 kg (277 lb 9 oz) 128.5 kg (283 lb 6.4 oz)    Examination:  General exam: Morbidly obese female lying on bed comfortable, not in distress Respiratory system: Bibasal decreased breath sound with mild expiratory wheeze, respiratory effort normal Cardiovascular system: S1 & S2 heard, RRR.  No pedal edema. Gastrointestinal system: Abdomen is nondistended, soft and nontender. Normal bowel sounds heard. Central nervous system: Alert and oriented. No focal neurological  deficits. Extremities: Symmetric 5 x 5 power. Skin: No rashes, lesions or ulcers Psychiatry: Judgement and insight appear normal. Mood & affect appropriate.     Data Reviewed: I have personally reviewed following labs and imaging studies  CBC:  Recent Labs Lab 12/28/16 2210 12/30/16 0557 12/31/16 0526  WBC 11.1* 18.3* 18.3*  NEUTROABS 7.0  --   --   HGB 12.1 12.3 12.4  HCT 37.3 38.3 38.3  MCV 89.0 90.8 91.6  PLT 335 350 701   Basic Metabolic Panel:  Recent Labs Lab 12/28/16 2210 12/30/16 0557  NA 138 138  K 4.3 4.1  CL 103 102  CO2 29 28  GLUCOSE 119* 172*  BUN 6 10  CREATININE 0.71 0.75  CALCIUM 8.8* 8.9   GFR: Estimated Creatinine Clearance: 103.4 mL/min (by C-G formula based on SCr of 0.75 mg/dL). Liver Function Tests:  Recent Labs Lab 12/28/16 2210  AST 21  ALT 29  ALKPHOS 75  BILITOT 0.5  PROT 6.4*  ALBUMIN 3.2*   No results for input(s): LIPASE, AMYLASE in the last 168 hours. No results for input(s): AMMONIA in the last 168 hours. Coagulation Profile:  Recent Labs Lab 12/29/16 0712  INR 1.07   Cardiac Enzymes:  Recent Labs Lab 12/29/16 0943 12/29/16 1412 12/29/16 2028  TROPONINI <0.03 <0.03 <0.03   BNP (last 3 results) No results for input(s): PROBNP in the last 8760 hours. HbA1C: No results for input(s): HGBA1C in the last 72 hours. CBG: No results for input(s): GLUCAP in the last 168 hours. Lipid Profile: No results for input(s): CHOL, HDL, LDLCALC, TRIG, CHOLHDL, LDLDIRECT in the last 72 hours. Thyroid Function Tests:  Recent Labs  12/29/16 0943  TSH 1.183   Anemia Panel: No results for input(s): VITAMINB12, FOLATE, FERRITIN, TIBC, IRON, RETICCTPCT in the last 72 hours. Sepsis Labs: No results for input(s): PROCALCITON, LATICACIDVEN in the last 168 hours.  Recent Results (from the past 240 hour(s))  MRSA PCR Screening     Status: None   Collection Time: 12/29/16  4:23 PM  Result Value Ref Range Status   MRSA by PCR  NEGATIVE NEGATIVE Final    Comment:        The GeneXpert MRSA Assay (FDA approved for NASAL specimens only), is one component of a comprehensive MRSA colonization surveillance program. It is not intended to diagnose MRSA infection nor to guide or monitor treatment for MRSA infections.          Radiology Studies: No results found.      Scheduled Meds: . amitriptyline  100 mg Oral QHS  . aspirin  81 mg Oral Daily  . atorvastatin  20 mg Oral Daily  . clopidogrel  75 mg Oral Daily  . colestipol  2 g Oral BID  . dextromethorphan-guaiFENesin  1 tablet Oral BID  . doxycycline  100 mg Oral Q12H  . enoxaparin (LOVENOX) injection  40 mg Subcutaneous Q24H  . ferrous sulfate  650 mg Oral QODAY  . furosemide  40 mg Oral Daily  .  gabapentin  900 mg Oral TID  . ipratropium-albuterol  3 mL Nebulization BID  . mouth rinse  15 mL Mouth Rinse BID  . methylPREDNISolone (SOLU-MEDROL) injection  60 mg Intravenous Q12H  . pantoprazole  20 mg Oral Daily  . sodium chloride flush  3 mL Intravenous Q12H   Continuous Infusions:   LOS: 1 day    Dron Tanna Furry, MD [facility] Pager [phone]  If 7PM-7AM, please contact night-coverage [email or web address] Password [email or web address] 12/31/2016, 2:53 PM

## 2017-01-01 MED ORDER — DM-GUAIFENESIN ER 30-600 MG PO TB12: 1.0000 | ORAL_TABLET | Freq: Two times a day (BID) | ORAL | 0 refills | Status: DC | PRN

## 2017-01-01 MED ORDER — PREDNISONE 50 MG PO TABS: 50.0000 mg | ORAL_TABLET | Freq: Every day | ORAL | 0 refills | Status: AC

## 2017-01-01 MED ORDER — LEVOFLOXACIN 500 MG PO TABS: 500.0000 mg | ORAL_TABLET | Freq: Every day | ORAL | 0 refills | Status: AC

## 2017-01-01 MED ORDER — LEVOFLOXACIN 500 MG PO TABS
500.0000 mg | ORAL_TABLET | Freq: Every day | ORAL | Status: DC
  Administered 2017-01-01: 500 mg via ORAL
  Filled 2017-01-01: qty 1

## 2017-01-01 NOTE — Progress Notes (Signed)
Subjective:     Patient ID: Elizabeth Diaz, female   DOB: 1966-05-06, 51 y.o.   MRN: [id number]  HPI **Seen at the [place]**  51 year old female presents the office today for concerns of pain to both morphine describes as a pins and needle sensation. She also states that she has pain on the top and bottom of her left foot intermittently acid on the last couple months. She occasionally gets swelling to the left foot. She denies any recent injury or trauma to the area. She is currently on gabapentin for neuropathy. She denies any claudication symptoms. Chest assistive nails are discolored, thick, long gait she cannot trim them herself. She denies any redness or drainage from the toe nail sites. She has no complaints this time.  Review of Systems  All other systems reviewed and are negative.      Objective:   Physical Exam General: AAO x3, NAD  Dermatological: Nails are hypertrophic, dystrophic, brittle, discolored, elongated 10. No surrounding redness or drainage. Tenderness nails 1-5 bilaterally. No open lesions or pre-ulcerative lesions are identified today.  Vascular: Dorsalis Pedis artery and Posterior Tibial artery pedal pulses are with immedate capillary fill time. There is no pain with calf compression, swelling, warmth, erythema.   Neruologic: Sensation decreased with SWMF.   Musculoskeletal: There is mild tenderness the dorsal aspect left midfoot. There is no specific area pinpoint bony tenderness. There is trace edema to the fibula but is no erythema or increase in warmth. Muscular strength 5/5 in all groups tested bilateral.  Gait: Unassisted, Nonantalgic.      Assessment:     51 year old female with neuropathy, symptomatic onychomycosis, left foot pain.    Plan:     -Treatment options discussed including all alternatives, risks, and complications -Etiology of symptoms were discussed -Order left foot x-rays -Nails are sharply debrided 10  without complications or bleeding. Prescribed Penlac. -She is causing gabapentin for neuropathy and also prescribed Capsicin cream. -Follow-up in 2 months or sooner if needed. Call any questions or concerns.    Celesta Gentile, DPM

## 2017-01-01 NOTE — Discharge Summary (Signed)
Physician Discharge Summary  KYLEY SOLOW VZC:[id number] DOB: 06-Jun-1966 DOA: 12/29/2016  PCP: Carmie Kanner, NP  Admit date: 12/29/2016 Discharge date: 01/01/2017  Admitted From:home Disposition:home  Recommendations for Outpatient Follow-up:  1. Follow up with PCP in 1-2 weeks 2. Please obtain BMP/CBC in one week   Home Health:no Equipment/Devices:no Discharge Condition:stable CODE STATUS:full Diet recommendation:heart healthy  Brief/Interim Summary: 51 y.o.femaleMorbidly obese with medical history significant for recently diagnosed COPD, CHF with preserved EF, PAD with right SFA PTI, Hyperlipidemia, HTN, GERD, OSA - not on CPAP d/t loss insurance coverage, seizure disorder, arthritis with chronic pain, diverticulosis, Schatzki's ring, pneumothorax, obesitywho presented to the ED with c/o of increasing SOB. Patient said that her SOB has been going on for more than a year and she was diagnosed with "lungs spots" and has an upcoming appointment with pulmonologist at the [facility] clinic in June.  ED Course:in the ED patient was afebrile, slightly tachycardic with HR 105, and tachypneic with RR 25, chest x-ray showed scattered bronchitic changes with superimposed bilateral linear scarring in no other acute cardiopulmonary disease.  Blood work demonstrated mild leukocytosis with white blood cells count 11,100, renal function was normal, otherwise unremarkable  #Possible acute COPD exacerbation: Patient with shortness of breath and hemoptysis on admission. CT scan of the chest negative for PE. -treated Solu-Medrol,  bronchodilators and antibiotics With significant clinical improvement. Patient is not hypoxic. Able to ambulate with acceptable oxygen saturation. Denied shortness of breath, chest pain. Only has mild dry cough.  Plan to discharge with oral Levaquin, shortness of prednisone and discussed to resume her bronchitis.  #Obstructive sleep apnea: Continue CPAP. Recommended  outpatient follow-up with PCP and pulmonologist. Patient reported that C is undergoing evaluation at [facility]. I recommended her to follow-up there.  #History of chronic CHF with preserved EF: Patient is not on acute CHF exacerbation. BNP and troponin unremarkable. Resume home dose of Lasix. -echo showed EF of 60-65% with normal wall motion. Clinically stable.  # leukocytosis likely in the setting of steroid. Afebrile.  # Hypertension:monitor BP  #PAD - s/p right SFA PTI Continue Plavix, aspirin and statin  Patient is clinically improving. Denied chest pain, shortness of breath, nausea or vomiting. She has dry cough recommended to continue Mucinex and medications. She will be discharged with oral Levaquin, short course of prednisone. I recommended patient to follow-up with her PCP and pulmonologist in 1-2 weeks. She verbalized understanding. She was able to ambulate with acceptable oxygen saturation.   Discharge Diagnoses:  Active Problems:   Gastroesophageal reflux disease without esophagitis   OSA (obstructive sleep apnea)   COPD (chronic obstructive pulmonary disease) (HCC)   HTN (hypertension)   Hyperlipidemia   GERD (gastroesophageal reflux disease)   COPD exacerbation (HCC)    Discharge Instructions  Discharge Instructions    Call MD for:  difficulty breathing, headache or visual disturbances    Complete by:  As directed    Call MD for:  extreme fatigue    Complete by:  As directed    Call MD for:  hives    Complete by:  As directed    Call MD for:  persistant dizziness or light-headedness    Complete by:  As directed    Call MD for:  persistant nausea and vomiting    Complete by:  As directed    Call MD for:  severe uncontrolled pain    Complete by:  As directed    Call MD for:  temperature >100.4  Complete by:  As directed    Diet - low sodium heart healthy    Complete by:  As directed    Discharge instructions    Complete by:  As directed     Please follow up with your PCP and pulmonologist in 1-2 weeks. Discuss about CPAP   Increase activity slowly    Complete by:  As directed      Allergies as of 01/01/2017   No Known Allergies     Medication List    STOP taking these medications   capsaicin 0.025 % cream Commonly known as:  ZOSTRIX   ciclopirox 8 % solution Commonly known as:  PENLAC     TAKE these medications   amitriptyline 75 MG tablet Commonly known as:  ELAVIL Take 100 mg by mouth at bedtime.   aspirin 81 MG chewable tablet Chew 81 mg by mouth daily.   atorvastatin 20 MG tablet Commonly known as:  LIPITOR Take 20 mg by mouth daily.   clopidogrel 75 MG tablet Commonly known as:  PLAVIX Take 1 tablet (75 mg total) by mouth daily.   colestipol 1 g tablet Commonly known as:  COLESTID Take 2 tablets (2 g total) by mouth 2 (two) times daily.   dextromethorphan-guaiFENesin 30-600 MG 12hr tablet Commonly known as:  MUCINEX DM Take 1 tablet by mouth 2 (two) times daily as needed for cough.   ferrous sulfate 325 (65 FE) MG tablet Take 2 tablets by mouth every other day.   furosemide 40 MG tablet Commonly known as:  LASIX Take 1 tablet (40 mg total) by mouth daily as needed for edema.   gabapentin 300 MG capsule Commonly known as:  NEURONTIN Take 2-3 capsules (600-900 mg total) by mouth 3 (three) times daily. 600mg  in the morning, 600mg  in the afternoon.  900mg  in the evening. What changed:  how much to take  additional instructions   HYDROcodone-acetaminophen 5-325 MG tablet Commonly known as:  NORCO/VICODIN Take 1 tablet by mouth every 6 (six) hours as needed for moderate pain.   levofloxacin 500 MG tablet Commonly known as:  LEVAQUIN Take 1 tablet (500 mg total) by mouth daily. Start taking on:  01/02/2017   loperamide 2 MG tablet Commonly known as:  IMODIUM A-D Take 1 tablet (2 mg total) by mouth 3 (three) times daily. What changed:  when to take this  reasons to take this    pantoprazole 20 MG tablet Commonly known as:  PROTONIX Take 20 mg by mouth daily.   predniSONE 50 MG tablet Commonly known as:  DELTASONE Take 1 tablet (50 mg total) by mouth daily with breakfast.   tiZANidine 4 MG tablet Commonly known as:  ZANAFLEX Take 1-2 tablets (4-8 mg total) by mouth every 6 (six) hours as needed for muscle spasms.      Follow-up Information    PLACEY,MARY H, NP. Schedule an appointment as soon as possible for a visit in 1 week(s).   Contact information: [address] [place] [address] [phone]          No Known Allergies  Consultations: None  Procedures/Studies: CT chest, echocardiogram.   Subjective: Patient was seen and examined at bedside. She reported doing well. Denied headache, dizziness, nausea, vomiting, chest, shortness of breath.  Discharge Exam: Vitals:   12/31/16 2355 01/01/17 0636  BP:  129/74  Pulse:  90  Resp: 17 18  Temp:  98.1 F (36.7 C)   Vitals:   12/31/16 2355 01/01/17 0239 01/01/17  0636 01/01/17 0844  BP:   129/74   Pulse:   90   Resp: 17  18   Temp:   98.1 F (36.7 C)   TempSrc:   Oral   SpO2:   99% 99%  Weight:  130 kg (286 lb 11.2 oz)    Height:        General: Pt is alert, awake, not in acute distress Cardiovascular: RRR, S1/S2 +, no rubs, no gallops Respiratory: CTA bilaterally, no wheezing, no rhonchi Abdominal: Soft, NT, ND, bowel sounds + Extremities: no edema, no cyanosis    The results of significant diagnostics from this hospitalization (including imaging, microbiology, ancillary and laboratory) are listed below for reference.     Microbiology: Recent Results (from the past 240 hour(s))  MRSA PCR Screening     Status: None   Collection Time: 12/29/16  4:23 PM  Result Value Ref Range Status   MRSA by PCR NEGATIVE NEGATIVE Final    Comment:        The GeneXpert MRSA Assay (FDA approved for NASAL specimens only), is one component of a comprehensive MRSA  colonization surveillance program. It is not intended to diagnose MRSA infection nor to guide or monitor treatment for MRSA infections.      Labs: BNP (last 3 results)  Recent Labs  02/12/16 1536 12/29/16 0943  BNP 33.1 30.1   Basic Metabolic Panel:  Recent Labs Lab 12/28/16 2210 12/30/16 0557  NA 138 138  K 4.3 4.1  CL 103 102  CO2 29 28  GLUCOSE 119* 172*  BUN 6 10  CREATININE 0.71 0.75  CALCIUM 8.8* 8.9   Liver Function Tests:  Recent Labs Lab 12/28/16 2210  AST 21  ALT 29  ALKPHOS 75  BILITOT 0.5  PROT 6.4*  ALBUMIN 3.2*   No results for input(s): LIPASE, AMYLASE in the last 168 hours. No results for input(s): AMMONIA in the last 168 hours. CBC:  Recent Labs Lab 12/28/16 2210 12/30/16 0557 12/31/16 0526  WBC 11.1* 18.3* 18.3*  NEUTROABS 7.0  --   --   HGB 12.1 12.3 12.4  HCT 37.3 38.3 38.3  MCV 89.0 90.8 91.6  PLT 335 350 383   Cardiac Enzymes:  Recent Labs Lab 12/29/16 0943 12/29/16 1412 12/29/16 2028  TROPONINI <0.03 <0.03 <0.03   BNP: Invalid input(s): POCBNP CBG: No results for input(s): GLUCAP in the last 168 hours. D-Dimer No results for input(s): DDIMER in the last 72 hours. Hgb A1c No results for input(s): HGBA1C in the last 72 hours. Lipid Profile No results for input(s): CHOL, HDL, LDLCALC, TRIG, CHOLHDL, LDLDIRECT in the last 72 hours. Thyroid function studies No results for input(s): TSH, T4TOTAL, T3FREE, THYROIDAB in the last 72 hours.  Invalid input(s): FREET3 Anemia work up No results for input(s): VITAMINB12, FOLATE, FERRITIN, TIBC, IRON, RETICCTPCT in the last 72 hours. Urinalysis    Component Value Date/Time   COLORURINE YELLOW 02/14/2014 [place] 02/14/2014 1605   LABSPEC 1.016 02/14/2014 1605   PHURINE 8.0 02/14/2014 1605   GLUCOSEU NEGATIVE 02/14/2014 1605   HGBUR NEGATIVE 02/14/2014 1605   BILIRUBINUR NEGATIVE 02/14/2014 1605   KETONESUR NEGATIVE 02/14/2014 1605   PROTEINUR  NEGATIVE 02/14/2014 1605   UROBILINOGEN 1.0 02/14/2014 1605   NITRITE NEGATIVE 02/14/2014 1605   LEUKOCYTESUR NEGATIVE 02/14/2014 1605   Sepsis Labs Invalid input(s): PROCALCITONIN,  WBC,  LACTICIDVEN Microbiology Recent Results (from the past 240 hour(s))  MRSA PCR Screening     Status: None  Collection Time: 12/29/16  4:23 PM  Result Value Ref Range Status   MRSA by PCR NEGATIVE NEGATIVE Final    Comment:        The GeneXpert MRSA Assay (FDA approved for NASAL specimens only), is one component of a comprehensive MRSA colonization surveillance program. It is not intended to diagnose MRSA infection nor to guide or monitor treatment for MRSA infections.      Time coordinating discharge: 28 minutes  SIGNED:   Rosita Fire, MD  [facility] 01/01/2017, 12:09 PM  If 7PM-7AM, please contact night-coverage [email or web address] Password [email or web address]

## 2017-01-01 NOTE — Progress Notes (Signed)
Occupational Therapy Treatment Patient Details Name: Elizabeth Diaz MRN: [id number] DOB: January 08, 1966 Today's Date: 01/01/2017    History of present illness  51 y.o. female with medical history significant for recently diagnosed COPD, CHF with preserved EF, PAD with right SFA PTI, Hyperlipidemia, HTN, GERD, OSA - not on CPAP d/t loss insurance coverage, seizure disorder, arthritis with chronic pain, diverticulosis, Schatzki's ring, pneumothorax, obesity who presented to the ED with c/o of increasing SOB   OT comments  Pt reports increased fatigue today but DOE 1/4 this session. Pt able to perform simulated toilet transfer and tub transfer with min guard assist; set up and supervision for donning socks sitting EOB. Educated pt on energy conservation strategies and provided handout. D/c plan remains appropriate. Will continue to follow acutely.   Follow Up Recommendations  No OT follow up;Supervision - Intermittent    Equipment Recommendations  Tub/shower seat    Recommendations for Other Services      Precautions / Restrictions Precautions Precautions: Fall Restrictions Weight Bearing Restrictions: No       Mobility Bed Mobility Overal bed mobility: Needs Assistance Bed Mobility: Supine to Sit;Sit to Supine     Supine to sit: Supervision Sit to supine: Supervision   General bed mobility comments: Supervision for safety, no physical assist required. Use of bed rail to scoot up in bed with HOB flat.  Transfers Overall transfer level: Needs assistance Equipment used: Straight cane Transfers: Sit to/from Stand Sit to Stand: Supervision         General transfer comment: Supervision for safety    Balance Overall balance assessment: Needs assistance;History of Falls Sitting-balance support: Feet supported;No upper extremity supported Sitting balance-Leahy Scale: Good     Standing balance support: Single extremity supported;During functional activity Standing  balance-Leahy Scale: Fair                             ADL either performed or assessed with clinical judgement   ADL Overall ADL's : Needs assistance/impaired                 Upper Body Dressing : Set up;Sitting;Standing   Lower Body Dressing: Supervision/safety;Sit to/from stand Lower Body Dressing Details (indicate cue type and reason): able to don socks sitting EOB Toilet Transfer: Min guard;Ambulation (cane) Toilet Transfer Details (indicate cue type and reason): simulated     Tub/ Shower Transfer: Min guard;Tub transfer;Ambulation Tub/Shower Transfer Details (indicate cue type and reason): simulated in room Functional mobility during ADLs: Min guard;Cane General ADL Comments: Educated pt on energy conservation strategies and provided handout.     Vision       Perception     Praxis      Cognition Arousal/Alertness: Awake/alert Behavior During Therapy: WFL for tasks assessed/performed Overall Cognitive Status: Within Functional Limits for tasks assessed                                          Exercises     Shoulder Instructions       General Comments      Pertinent Vitals/ Pain       Pain Assessment: Faces Faces Pain Scale: Hurts little more Pain Location: R hip Pain Descriptors / Indicators: Burning Pain Intervention(s): Monitored during session;Repositioned;RN gave pain meds during session  Home Living  Prior Functioning/Environment              Frequency  Min 2X/week        Progress Toward Goals  OT Goals(current goals can now be found in the care plan section)  Progress towards OT goals: Not progressing toward goals - comment (min guard today for mobility)  Acute Rehab OT Goals Patient Stated Goal: home OT Goal Formulation: With patient  Plan Discharge plan remains appropriate    Co-evaluation                 End of Session  Equipment Utilized During Treatment: Other (comment) (cane)  OT Visit Diagnosis: Unsteadiness on feet (R26.81);History of falling (Z91.81)   Activity Tolerance Patient tolerated treatment well   Patient Left in bed;with call bell/phone within reach   Nurse Communication Mobility status        Time: 8768-1157 OT Time Calculation (min): 16 min  Charges: OT General Charges $OT Visit: 1 Procedure OT Treatments $Self Care/Home Management : 8-22 mins  Amari Zagal A. Ulice Brilliant, M.S., OTR/L Pager: [facility] 01/01/2017, 10:01 AM

## 2017-01-01 NOTE — Care Management Note (Signed)
Case Management Note  Patient Details  Name: Elizabeth Diaz MRN: [id number] Date of Birth: [birth year]  Subjective/Objective:                 Spoke with patient and mother. Mother wanted CPAP for patient, explained process of sleep study and DC instructions state to follow up with PCP/ Pulm to set up. Mother appreciative. Spoke with patient about medications. Patient states she gets meds filled at [facility] with orange card. CM advised her get her ride her ASAP so she could get meds filled today prior to them closing. Patient verbalized understanding and importance to start abx immediately after DC.   Action/Plan:  No other CM needs identified.  Expected Discharge Date:  01/01/17               Expected Discharge Plan:  Home/Self Care  In-House Referral:     Discharge planning Services  CM Consult  Post Acute Care Choice:    Choice offered to:     DME Arranged:    DME Agency:     HH Arranged:    HH Agency:     Status of Service:  Completed, signed off  If discussed at [employer] of Stay Meetings, dates discussed:    Additional Comments:  Carles Collet, RN 01/01/2017, 12:52 PM

## 2017-01-01 NOTE — Progress Notes (Signed)
NURSING PROGRESS NOTE  CIMBERLY STOFFEL [id number] Discharge Data: 01/01/2017 1:18 PM Attending Provider: Rosita Fire, MD LVD:[id number],MZTA H, NP   Ivery Quale to be D/C'd Home per MD order.    All IV's will be discontinued and monitored for bleeding.  All belongings will be returned to patient for patient to take home.  Last Documented Vital Signs:  Blood pressure 129/74, pulse 90, temperature 98.1 F (36.7 C), temperature source Oral, resp. rate 18, height 5' (1.524 m), weight 130 kg (286 lb 11.2 oz), last menstrual period 12/03/2016, SpO2 99 %.  Joslyn Hy, MSN, RN, [employer]

## 2017-01-05 ENCOUNTER — Telehealth: Payer: Self-pay | Admitting: *Deleted

## 2017-01-05 NOTE — Telephone Encounter (Signed)
Pt came in inquiring about an order for an x-ray on her foot. States that she never got a call from the hospital to schedule her appt. Please f/u. Thank you

## 2017-01-06 MED FILL — CICLOPIROX 8% SOLUTION: 8 | 30 days supply | Qty: 7 | Fill #0

## 2017-01-07 NOTE — Telephone Encounter (Signed)
I ordered it for Elizabeth Diaz. I will try to have our office call her to get this done. Thanks.

## 2017-01-11 NOTE — Progress Notes (Signed)
Coding clarification:  Patient has history of chronic diastolic CHF.

## 2017-01-13 ENCOUNTER — Telehealth: Payer: Self-pay | Admitting: *Deleted

## 2017-01-13 NOTE — Telephone Encounter (Signed)
Called the patient and left a message to go and get the x-ray Dr Jacqualyn Posey had ordered on April 2nd and go this Friday the 20th and be there at 10:15 am for a 10:30 am appointment. Spoke with Solmon Ice and she stated that they do not call patient's to come in the patient has to call the department. Elizabeth Diaz

## 2017-01-15 ENCOUNTER — Ambulatory Visit (HOSPITAL_COMMUNITY)
Admission: RE | Admit: 2017-01-15 | Discharge: 2017-01-15 | Disposition: A | Discharge status: Home / Self Care | Payer: Self-pay | Source: Ambulatory Visit | Attending: Podiatry | Admitting: Podiatry

## 2017-01-15 DIAGNOSIS — M79672 Pain in left foot: Secondary | ICD-10-CM | POA: Insufficient documentation

## 2017-01-15 DIAGNOSIS — M19072 Primary osteoarthritis, left ankle and foot: Secondary | ICD-10-CM | POA: Insufficient documentation

## 2017-01-15 DIAGNOSIS — M7732 Calcaneal spur, left foot: Secondary | ICD-10-CM | POA: Insufficient documentation

## 2017-02-01 ENCOUNTER — Telehealth: Payer: Self-pay

## 2017-02-01 NOTE — Telephone Encounter (Signed)
-----   Message from Jerene Bears, MD sent at 02/01/2017  1:24 PM EDT ----- Regarding: RE: CT CHEST The recent CT suffices for the recall Can disregard the recall for CT chest Thanks JMP  ----- Message ----- From: Algernon Huxley, RN Sent: 02/01/2017  10:09 AM To: Jerene Bears, MD Subject: FW: CT CHEST                                   Dr. Hilarie Fredrickson,  We had a reminder in the systerm for pt to have a ct of chest in 1 year which would be due this month. Pt had CT angio of chest done 12/29/16 and chest xray done 01/03/17. Does pt still need to have CT of chest done? Please advise.  Thanks, Vaughan Basta ----- Message ----- From: Algernon Huxley, RN Sent: 02/01/2017 To: Algernon Huxley, RN Subject: CT CHEST                                       Needs CT of chest in 1 year

## 2017-03-01 ENCOUNTER — Ambulatory Visit: Payer: Self-pay

## 2017-03-22 ENCOUNTER — Ambulatory Visit: Payer: Self-pay | Attending: Podiatry | Admitting: Podiatry

## 2017-03-22 DIAGNOSIS — M19072 Primary osteoarthritis, left ankle and foot: Secondary | ICD-10-CM

## 2017-03-22 DIAGNOSIS — B351 Tinea unguium: Secondary | ICD-10-CM

## 2017-03-22 DIAGNOSIS — G629 Polyneuropathy, unspecified: Secondary | ICD-10-CM

## 2017-03-22 MED ORDER — CAPSAICIN 0.025 % EX CREA: TOPICAL_CREAM | Freq: Two times a day (BID) | CUTANEOUS | 0 refills | Status: DC

## 2017-03-22 MED ORDER — CICLOPIROX 8 % EX SOLN: Freq: Every day | CUTANEOUS | 2 refills | Status: DC

## 2017-03-25 NOTE — Progress Notes (Signed)
Subjective:     Patient ID: Elizabeth Diaz, female   DOB: [birth year], 51 y.o.   MRN: [id number]  HPI **Seen at the [place]**  51 year old female presents the office today for concerns of pain to bothOf her feet most of the top as well as the bottom. She also complains of sharp pain. She does not to the Season cream. She states that her nails are about the same. She's been using the topical antifungal and asking for refill. She denies any pain to the toenails denies any redness or drainage or any swelling. She has no other concerns today.  Review of Systems  All other systems reviewed and are negative.      Objective:   Physical Exam General: AAO x3, NAD  Dermatological: Nails are hypertrophic, dystrophic, brittle, discolored, 10. There is yellow to brown discoloration of the nails. No surrounding redness or drainage. No tenderness the nails today. No open lesions or pre-ulcerative lesions are identified today.  Vascular: Dorsalis Pedis artery and Posterior Tibial artery pedal pulses are with immedate capillary fill time. There is no pain with calf compression, swelling, warmth, erythema.   Neruologic: Sensation decreased with SWMF.   Musculoskeletal: There is mild tenderness the dorsal aspect left midfoot. There is no specific area pinpoint bony tenderness. There is trace edema to the fibula but is no erythema or increase in warmth. Muscular strength 5/5 in all groups tested bilateral.  Gait: Unassisted, Nonantalgic.      Assessment:     51 year old female with neuropathy, symptomatic onychomycosis, left foot pain.    Plan:     -Treatment options discussed including all alternatives, risks, and complications -Etiology of symptoms were discussed -I reviewed the x-rays of the patient. -Continue Penlac. Refilled today. -I ordered States in [place] she did not get this. -Discussed shoe gear changes. -Follow-up in 4 months or sooner if needed. Call any  questions or concerns.    Celesta Gentile, DPM

## 2017-03-26 ENCOUNTER — Other Ambulatory Visit: Payer: Self-pay | Admitting: Podiatry

## 2017-04-06 MED FILL — CICLOPIROX 8% SOLUTION: 8 | 28 days supply | Qty: 7 | Fill #0

## 2017-08-24 ENCOUNTER — Other Ambulatory Visit: Payer: Self-pay | Admitting: *Deleted

## 2017-08-24 DIAGNOSIS — Z1231 Encounter for screening mammogram for malignant neoplasm of breast: Secondary | ICD-10-CM

## 2017-12-04 ENCOUNTER — Encounter (HOSPITAL_COMMUNITY): Payer: Self-pay | Admitting: Emergency Medicine

## 2017-12-04 ENCOUNTER — Emergency Department (HOSPITAL_COMMUNITY)
Admission: EM | Admit: 2017-12-04 | Discharge: 2017-12-05 | Disposition: A | Discharge status: Home / Self Care | Payer: Medicaid Other | Attending: Emergency Medicine | Admitting: Emergency Medicine

## 2017-12-04 ENCOUNTER — Other Ambulatory Visit: Payer: Self-pay

## 2017-12-04 ENCOUNTER — Emergency Department (HOSPITAL_COMMUNITY): Payer: Medicaid Other

## 2017-12-04 DIAGNOSIS — S90852A Superficial foreign body, left foot, initial encounter: Secondary | ICD-10-CM | POA: Diagnosis not present

## 2017-12-04 DIAGNOSIS — Y998 Other external cause status: Secondary | ICD-10-CM | POA: Diagnosis not present

## 2017-12-04 DIAGNOSIS — J449 Chronic obstructive pulmonary disease, unspecified: Secondary | ICD-10-CM | POA: Diagnosis not present

## 2017-12-04 DIAGNOSIS — Z23 Encounter for immunization: Secondary | ICD-10-CM | POA: Insufficient documentation

## 2017-12-04 DIAGNOSIS — Z7902 Long term (current) use of antithrombotics/antiplatelets: Secondary | ICD-10-CM | POA: Diagnosis not present

## 2017-12-04 DIAGNOSIS — W268XXA Contact with other sharp object(s), not elsewhere classified, initial encounter: Secondary | ICD-10-CM | POA: Diagnosis not present

## 2017-12-04 DIAGNOSIS — Z87891 Personal history of nicotine dependence: Secondary | ICD-10-CM | POA: Diagnosis not present

## 2017-12-04 DIAGNOSIS — Y9301 Activity, walking, marching and hiking: Secondary | ICD-10-CM | POA: Diagnosis not present

## 2017-12-04 DIAGNOSIS — Z7982 Long term (current) use of aspirin: Secondary | ICD-10-CM | POA: Diagnosis not present

## 2017-12-04 DIAGNOSIS — I10 Essential (primary) hypertension: Secondary | ICD-10-CM | POA: Diagnosis not present

## 2017-12-04 DIAGNOSIS — Y929 Unspecified place or not applicable: Secondary | ICD-10-CM | POA: Diagnosis not present

## 2017-12-04 DIAGNOSIS — S99922A Unspecified injury of left foot, initial encounter: Secondary | ICD-10-CM | POA: Diagnosis present

## 2017-12-04 DIAGNOSIS — Z79899 Other long term (current) drug therapy: Secondary | ICD-10-CM | POA: Diagnosis not present

## 2017-12-04 MED ORDER — TETANUS-DIPHTH-ACELL PERTUSSIS 5-2.5-18.5 LF-MCG/0.5 IM SUSP
0.5000 mL | Freq: Once | INTRAMUSCULAR | Status: AC
  Administered 2017-12-04: 0.5 mL via INTRAMUSCULAR
  Filled 2017-12-04: qty 0.5

## 2017-12-04 MED ORDER — LIDOCAINE-EPINEPHRINE 2 %-1:100000 IJ SOLN
20.0000 mL | Freq: Once | INTRAMUSCULAR | Status: AC
  Administered 2017-12-05: 20 mL
  Filled 2017-12-04: qty 20

## 2017-12-04 NOTE — ED Provider Notes (Signed)
[facility] EMERGENCY DEPARTMENT Provider Note   CSN: [id number] Arrival date & time: 12/04/17  2025     History   Chief Complaint Chief Complaint  Patient presents with  . Foot Pain    HPI Elizabeth Diaz is a 52 y.o. female with a hx of chronic pain, arthritis, diverticulosis, hypertension, obesity presents to the Emergency Department complaining of gradual, persistent, progressively worsening left foot pain onset approximately 1 week ago.  Patient reports she stepped on a plastic fan which broke, cutting the bottom of her foot.  She reports that she feels like there is something still in her foot.  No treatments prior to arrival.  Patient reports pain is worse when walking.  Patient reports a history of diabetes however through her record review shows diagnosis only of prediabetes and patient is not taking any medication for this.  Patient denies fevers, chills, chest pain, shortness of breath, abdominal pain, nausea, vomiting.  Nothing seems to make her symptoms better.  She denies swelling of the foot.  She denies numbness or weakness in the foot or leg.     The history is provided by the patient and medical records. No language interpreter was used.    Past Medical History:  Diagnosis Date  . Arthritis   . Chronic pain   . Diverticulosis   . HTN (hypertension) 12/29/2016  . Hyperplastic colon polyp   . Numbness    left leg  . Obesity   . Pneumothorax   . Schatzki's ring   . Seizures ([facility])    hx of, no seizures last few years    Patient Active Problem List   Diagnosis Date Noted  . COPD exacerbation ([place]) 12/30/2016  . COPD with acute exacerbation ([place]) 12/29/2016  . Hemoptysis 12/29/2016  . COPD (chronic obstructive pulmonary disease) ([place]) 12/29/2016  . HTN (hypertension) 12/29/2016  . Hyperlipidemia 12/29/2016  . GERD (gastroesophageal reflux disease) 12/29/2016  . Cough with hemoptysis   . PAD (peripheral artery disease) ([place]) 02/25/2016  .  OSA (obstructive sleep apnea) 02/07/2016  . Exertional dyspnea 02/07/2016  . Claudication in peripheral vascular disease ([place]) 01/06/2016  . Lower extremity edema 12/12/2015  . Chest pain 12/12/2015  . Hearing loss 11/27/2015  . Morbid obesity ([place]) 10/29/2015  . Excessive daytime sleepiness 10/05/2015  . History of depression 10/05/2015  . Nail discoloration 10/05/2015  . Chronic diarrhea   . Gastroesophageal reflux disease without esophagitis   . Abdominal pain, epigastric   . Prediabetes 06/10/2015  . Annual physical exam 06/10/2015  . Bilateral lower extremity edema 04/08/2015  . Urinary incontinence 04/08/2015  . Chronic knee pain 12/07/2014  . Benign neoplasm of rectum   . IBS (irritable bowel syndrome)   . Right-sided back pain 05/14/2014  . Fecal incontinence 05/06/2014    Past Surgical History:  Procedure Laterality Date  . CHEST TUBE INSERTION    . COLONOSCOPY WITH PROPOFOL N/A 11/01/2014   Procedure: COLONOSCOPY WITH PROPOFOL;  Surgeon: Jerene Bears, MD;  Location: [facility];  Service: Gastroenterology;  Laterality: N/A;  . ESOPHAGOGASTRODUODENOSCOPY (EGD) WITH PROPOFOL N/A 07/29/2015   Procedure: ESOPHAGOGASTRODUODENOSCOPY (EGD) WITH PROPOFOL;  Surgeon: Jerene Bears, MD;  Location: [facility];  Service: Gastroenterology;  Laterality: N/A;  . PERIPHERAL VASCULAR CATHETERIZATION N/A 01/07/2016   Procedure: Lower Extremity Angiography;  Surgeon: Adrian Prows, MD;  Location: [facility] CV LAB;  Service: Cardiovascular;  Laterality: N/A;  . PERIPHERAL VASCULAR CATHETERIZATION Right 02/25/2016   Procedure: Peripheral Vascular Atherectomy;  Surgeon: Adrian Prows, MD;  Location: [facility] CV LAB;  Service: Cardiovascular;  Laterality: Right;  SFA ATHERECTOMY/DRUG COATED PTA  . TUBAL LIGATION      OB History    No data available       Home Medications    Prior to Admission medications   Medication Sig Start Date End Date Taking? Authorizing Provider  amitriptyline  (ELAVIL) 75 MG tablet Take 100 mg by mouth at bedtime.     [provider]  aspirin 81 MG chewable tablet Chew 81 mg by mouth daily.    [provider]  atorvastatin (LIPITOR) 20 MG tablet Take 20 mg by mouth daily.    [provider]  capsaicin (ZOSTRIX) 0.025 % cream Apply topically 2 (two) times daily. 03/22/17   Trula Slade, DPM  cephALEXin (KEFLEX) 500 MG capsule Take 1 capsule (500 mg total) by mouth 3 (three) times daily for 5 days. 12/05/17 12/10/17  Aliany Fiorenza, Jarrett Soho, PA-C  ciclopirox (PENLAC) 8 % solution APPLY TOPICALLY AT BEDTIME. APPLY OVER NAIL AND SURROUNDING SKIN. APPLY DAILY OVER PREVIOUS COAT. AFTER 7 DAYS, MAY REMOVE WITH ALCOHOL AND 03/26/17   Trula Slade, DPM  clopidogrel (PLAVIX) 75 MG tablet Take 1 tablet (75 mg total) by mouth daily. 02/25/16   Adrian Prows, MD  colestipol (COLESTID) 1 g tablet Take 2 tablets (2 g total) by mouth 2 (two) times daily. 03/16/16   Pyrtle, Lajuan Lines, MD  dextromethorphan-guaiFENesin (MUCINEX DM) 30-600 MG 12hr tablet Take 1 tablet by mouth 2 (two) times daily as needed for cough. 01/01/17   Rosita Fire, MD  ferrous sulfate 325 (65 FE) MG tablet Take 2 tablets by mouth every other day.    [provider]  furosemide (LASIX) 40 MG tablet Take 1 tablet (40 mg total) by mouth daily as needed for edema. 12/12/15   Iline Oven, MD  gabapentin (NEURONTIN) 300 MG capsule Take 2-3 capsules (600-900 mg total) by mouth 3 (three) times daily. 600mg  in the morning, 600mg  in the afternoon.  900mg  in the evening. Patient taking differently: Take 900 mg by mouth 3 (three) times daily.  02/21/16   Frazier Richards, MD  HYDROcodone-acetaminophen (NORCO/VICODIN) 5-325 MG tablet Take 1 tablet by mouth every 6 (six) hours as needed for moderate pain. 02/21/16   Frazier Richards, MD  loperamide (IMODIUM A-D) 2 MG tablet Take 1 tablet (2 mg total) by mouth 3 (three) times daily. Patient taking differently: Take 2 mg by  mouth 4 (four) times daily as needed for diarrhea or loose stools.  02/11/16   Pyrtle, Lajuan Lines, MD  pantoprazole (PROTONIX) 20 MG tablet Take 20 mg by mouth daily.    [provider]  tiZANidine (ZANAFLEX) 4 MG tablet Take 1-2 tablets (4-8 mg total) by mouth every 6 (six) hours as needed for muscle spasms. 02/21/16   Frazier Richards, MD    Family History Family History  Problem Relation Age of Onset  . Heart failure Father   . Diabetes Father   . Diabetes Maternal Grandmother   . Diabetes Paternal Grandfather   . Cancer Maternal Grandfather        unknown type    Social History Social History   Tobacco Use  . Smoking status: Former Smoker    Packs/day: 1.50    Years: 0.00    Pack years: 0.00    Types: Cigarettes    Last attempt to quit: 05/04/2009    Years since quitting:  8.5  . Smokeless tobacco: Never Used  Substance Use Topics  . Alcohol use: Yes    Comment: occasionally  . 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 Haven't used any in two years.      Allergies   Patient has no known allergies.   Review of Systems Review of Systems  Constitutional: Negative for fever.  Gastrointestinal: Negative for nausea and vomiting.  Skin: Positive for wound.  Allergic/Immunologic: Negative for immunocompromised state.  Neurological: Negative for weakness and numbness.  Hematological: Does not bruise/bleed easily.  Psychiatric/Behavioral: The patient is not nervous/anxious.      Physical Exam Updated Vital Signs BP (!) 143/79 (BP Location: Right Wrist)   Pulse 78   Temp 99 F (37.2 C) (Oral)   Resp 18   SpO2 96%   Physical Exam  Constitutional: She is oriented to person, place, and time. She appears well-developed and well-nourished. No distress.  HENT:  Head: Normocephalic and atraumatic.  Eyes: Conjunctivae are normal. No scleral icterus.  Neck: Normal range of motion.  Cardiovascular: Normal rate, regular rhythm and intact distal pulses.    No murmur heard. Pulses:      Dorsalis pedis pulses are 2+ on the right side, and 2+ on the left side.  Capillary refill < 3 sec  Pulmonary/Chest: Effort normal and breath sounds normal. No respiratory distress.  Musculoskeletal: Normal range of motion. She exhibits no edema.  Full range of motion of the left ankle and all toes of the left foot  Neurological: She is alert and oriented to person, place, and time.  Sensation: Intact to normal touch throughout the left foot Strength: 5/5 with dorsiflexion and plantarflexion of the left great toe and left ankle  Skin: Skin is warm and dry. She is not diaphoretic.  For small, superficial lacerations to the plantar surface of the left foot; no erythema, edema, purulent drainage Visible foreign body in the second small laceration.  Psychiatric: She has a normal mood and affect.  Nursing note and vitals reviewed.    ED Treatments / Results   Radiology Dg Foot Complete Left  Result Date: 12/04/2017 CLINICAL DATA:  Stepped on broken pieces of a fan one week ago; Several spokes punctured the bottom of the left foot; Pain EXAM: LEFT FOOT - COMPLETE 3+ VIEW COMPARISON:  None. FINDINGS: No fracture.  No bone lesion. Joints are normally spaced and aligned. Small dorsal and plantar calcaneal spurs. Soft tissues are unremarkable.  No radiopaque foreign bodies. IMPRESSION: 1. No fracture or dislocation. 2. No radiopaque foreign bodies. Electronically Signed   By: Lajean Manes M.D.   On: 12/04/2017 20:17    Procedures .Foreign Body Removal Date/Time: 12/05/2017 12:31 AM Performed by: Abigail Butts, PA-C Authorized by: Abigail Butts, PA-C  Consent: Verbal consent obtained. Risks and benefits: risks, benefits and alternatives were discussed Consent given by: patient Patient identity confirmed: verbally with patient and arm band Time out: Immediately prior to procedure a "time out" was called to verify the correct patient, procedure,  equipment, support staff and site/side marked as required. Body area: skin General location: lower extremity Location details: left foot Anesthesia: local infiltration  Anesthesia: Local Anesthetic: lidocaine 2% with epinephrine (66mL)  Sedation: Patient sedated: no  Patient restrained: no Patient cooperative: yes Localization method: visualized Removal mechanism: scalpel and forceps Dressing: antibiotic ointment Tendon involvement: none Depth: subcutaneous Complexity: simple 1 objects recovered. Objects recovered: plastic Post-procedure assessment: foreign body removed Patient tolerance: Patient tolerated the  procedure well with no immediate complications   (including critical care time)  Medications Ordered in ED Medications  lidocaine-EPINEPHrine (XYLOCAINE W/EPI) 2 %-1:100000 (with pres) injection 20 mL (not administered)  Tdap (BOOSTRIX) injection 0.5 mL (0.5 mLs Intramuscular Given 12/04/17 2349)     Initial Impression / Assessment and Plan / ED Course  I have reviewed the triage vital signs and the nursing notes.  Pertinent labs & imaging results that were available during my care of the patient were reviewed by me and considered in my medical decision making (see chart for details).     Patient with several small lacerations to the plantar surface of the left foot after stepping on a fan.  This did not go through a shoe.  No evidence of infection.  Foreign body is visible in the subcutaneous skin of the plantar portion of the left foot.  Removed without complication.[place] without evidence of deep foreign body.  I personally evaluated these images.  No tenderness to palpation deep within the foot.  No edema of the foot.  Will place on Keflex.  Tdap updated.  Final Clinical Impressions(s) / ED Diagnoses   Final diagnoses:  Foreign body in left foot, initial encounter    ED Discharge Orders        Ordered    cephALEXin (KEFLEX) 500 MG capsule  3 times daily       12/05/17 0037       Chayson Charters, Jarrett Soho, PA-C 12/05/17 2633    Davonna Belling, MD 12/06/17 [phone]

## 2017-12-04 NOTE — ED Triage Notes (Signed)
The patient said she stepped on a fan that was on the floor and she thinks she has some of the part stuck in her left foot foot.  The patient rates her pain 8/10.  She has not taken anything for the pain.

## 2017-12-05 MED ORDER — CEPHALEXIN 500 MG PO CAPS: 500.0000 mg | ORAL_CAPSULE | Freq: Three times a day (TID) | ORAL | 0 refills | Status: AC

## 2017-12-05 NOTE — Discharge Instructions (Signed)
1. Medications: Keflex, usual home medications 2. Treatment: rest, drink plenty of fluids, keep wound clean with warm soap and water 3. Follow Up: Please followup with your primary doctor in 3-5 days for discussion of your diagnoses and further evaluation after today's visit; if you do not have a primary care doctor use the resource guide provided to find one; Please return to the ER for signs of infection

## 2017-12-05 NOTE — ED Notes (Signed)
Pt departed in NAD.  

## 2018-01-03 ENCOUNTER — Ambulatory Visit (INDEPENDENT_AMBULATORY_CARE_PROVIDER_SITE_OTHER): Payer: Medicaid Other | Admitting: Family Medicine

## 2018-01-03 ENCOUNTER — Encounter: Payer: Self-pay | Admitting: Family Medicine

## 2018-01-03 ENCOUNTER — Other Ambulatory Visit: Payer: Self-pay

## 2018-01-03 VITALS — BP 146/72 | HR 100 | Temp 98.5°F | Ht 60.0 in | Wt 298.0 lb

## 2018-01-03 DIAGNOSIS — M549 Dorsalgia, unspecified: Secondary | ICD-10-CM

## 2018-01-03 DIAGNOSIS — Z7689 Persons encountering health services in other specified circumstances: Secondary | ICD-10-CM | POA: Diagnosis not present

## 2018-01-03 DIAGNOSIS — I10 Essential (primary) hypertension: Secondary | ICD-10-CM | POA: Diagnosis not present

## 2018-01-03 DIAGNOSIS — E118 Type 2 diabetes mellitus with unspecified complications: Secondary | ICD-10-CM

## 2018-01-03 DIAGNOSIS — G8929 Other chronic pain: Secondary | ICD-10-CM

## 2018-01-03 NOTE — Progress Notes (Signed)
Subjective:    Patient ID: Elizabeth Diaz, female    DOB: 1965/12/15, 52 y.o.   MRN: [id number]   CC: establish care with new doctor  HPI: Chronic back Pain: Previously seen by doctor who was giving shots, and knee doctor gave shots to L4-L5 for pain. Gel shots to right legs [place] a few times from pains Used to stand on concrete at warehouse  Naproxen and shots for pain Nothing has helped PT first time helped, but not second 8/10 for back  Wellbutrin or cymbalta has not helped Sauna helps flexeril helps sometimes   Review of Systems  Constitutional: Negative.   HENT: Positive for tinnitus.   Eyes: Negative.   Respiratory: Positive for shortness of breath.   Cardiovascular: Positive for chest pain and leg swelling.  Gastrointestinal: Positive for abdominal pain, diarrhea and nausea. Negative for vomiting.  Genitourinary: Positive for frequency.  Musculoskeletal: Positive for joint pain and myalgias.  Skin: Negative.   Neurological: Positive for dizziness, weakness and headaches.  Endo/Heme/Allergies: Positive for polydipsia.  Psychiatric/Behavioral: Positive for depression. Negative for suicidal ideas. The patient is nervous/anxious.    Given review of systems patient was asked what was most important for her to talk about today.  Patient reports that chest pain is chronic and has not been any worse.  Other things that she noted as positive were chronic, and patient wanted to discuss back pain today.  Patient Active Problem List   Diagnosis Date Noted  . COPD exacerbation ([place]) 12/30/2016  . COPD with acute exacerbation ([place]) 12/29/2016  . COPD (chronic obstructive pulmonary disease) ([place]) 12/29/2016  . HTN (hypertension) 12/29/2016  . Hyperlipidemia 12/29/2016  . GERD (gastroesophageal reflux disease) 12/29/2016  . PAD (peripheral artery disease) ([place]) 02/25/2016  . OSA (obstructive sleep apnea) 02/07/2016  . Exertional dyspnea 02/07/2016  . Claudication in  peripheral vascular disease ([place]) 01/06/2016  . Lower extremity edema 12/12/2015  . Hearing loss 11/27/2015  . Morbid obesity ([place]) 10/29/2015  . Excessive daytime sleepiness 10/05/2015  . History of depression 10/05/2015  . Nail discoloration 10/05/2015  . Chronic diarrhea   . Gastroesophageal reflux disease without esophagitis   . Abdominal pain, epigastric   . Prediabetes 06/10/2015  . Bilateral lower extremity edema 04/08/2015  . Urinary incontinence 04/08/2015  . Chronic knee pain 12/07/2014  . Benign neoplasm of rectum   . IBS (irritable bowel syndrome)   . Chronic back pain 05/14/2014     Family History  Problem Relation Age of Onset  . Heart failure Father   . Diabetes Father   . Diabetes Maternal Grandmother   . Diabetes Paternal Grandfather   . Parkinson's disease Paternal Grandfather   . Cancer Maternal Grandfather        unknown type    Past Medical History:  Diagnosis Date  . Arthritis   . Chronic pain   . Congestive heart failure (CHF) ([place])   . COPD (chronic obstructive pulmonary disease) ([place])   . Diabetes mellitus ([place])   . Diverticulosis   . HTN (hypertension) 12/29/2016  . Hyperplastic colon polyp   . Numbness    left leg  . Obesity   . Pneumothorax   . Schatzki's ring   . Seizures ([facility])    hx of, no seizures last few years    Social Hx: Former smoker, occasional alcohol use, or illicit drug use at this time, but reports previously using cocaine, has not used in many years and previously using marijuana.   Objective:  BP (!) 146/72   Pulse 100   Temp 98.5 F (36.9 C) (Oral)   Ht 5' (1.524 m)   Wt 298 lb (135.2 kg)   SpO2 96%   BMI 58.20 kg/m  Vitals and nursing note reviewed  General: NAD, pleasant Head: Atraumatic Neck: Supple Cardiac: RRR, normal heart sounds, no murmurs Respiratory: CTAB, normal effort Abdomen: soft, nontender, nondistended. Bowel sounds present Extremities: no edema or cyanosis. WWP. Skin: warm and dry, no  rashes noted Neuro: alert and oriented, no focal deficits Psych: Neatly groomed and appropriately dressed. Maintains good eye contact and is cooperative and attentive. Speech is normal volume and rate. Normal affect.  Assessment & Plan:    HTN (hypertension) Patient with elevated BP to 146/72 today.  Will monitor at follow-up appointment.  Chronic back pain Patient reports previously receiving shots for her back, but is unsure what this is. We will obtain records from previous doctor's office. She reports that there is nothing more for me to do today for her as she has dealt with this for so long, but she will follow up again soon to try to find some relief for her.  She is currently seeing a psychiatrist who has her on multiple meds to also help, and she is seeing a nutritionist to help with weight loss. Will refer her to one closer here as well.    Elizabeth Takeshia Wenk, DO Family Medicine Resident, PGY-1

## 2018-01-03 NOTE — Patient Instructions (Signed)
Thank you for coming to see me today. It was a pleasure meeting you! Today we talked about:   Establishing care. We will get labs today and records from your previous doctor. We will let you know the lab results as soon as we can.   Please follow-up with me as needed.  If you have any questions or concerns, please do not hesitate to call the office at [phone].  Take Care,   [place] Ryon Layton, DO

## 2018-01-04 ENCOUNTER — Telehealth: Payer: Self-pay

## 2018-01-04 LAB — BASIC METABOLIC PANEL
BUN/Creatinine Ratio: 19 (ref 9–23)
BUN: 13 mg/dL (ref 6–24)
CO2: 25 mmol/L (ref 20–29)
Calcium: 9.2 mg/dL (ref 8.7–10.2)
Chloride: 96 mmol/L (ref 96–106)
Creatinine, Ser: 0.68 mg/dL (ref 0.57–1.00)
GFR, EST AFRICAN AMERICAN: 116 mL/min/{1.73_m2} (ref >59)
GFR, EST NON AFRICAN AMERICAN: 101 mL/min/{1.73_m2} (ref >59)
Glucose: 203 mg/dL — ABNORMAL HIGH (ref 65–99)
Potassium: 4.5 mmol/L (ref 3.5–5.2)
SODIUM: 137 mmol/L (ref 134–144)

## 2018-01-04 LAB — CBC
Hematocrit: 37.1 % (ref 34.0–46.6)
Hemoglobin: 12.2 g/dL (ref 11.1–15.9)
MCH: 28.8 pg (ref 26.6–33.0)
MCHC: 32.9 g/dL (ref 31.5–35.7)
MCV: 88 fL (ref 79–97)
PLATELETS: 287 10*3/uL (ref 150–379)
RBC: 4.23 x10E6/uL (ref 3.77–5.28)
RDW: 14.2 % (ref 12.3–15.4)
WBC: 8.1 10*3/uL (ref 3.4–10.8)

## 2018-01-04 NOTE — Telephone Encounter (Signed)
-----   Message from Martinique Shirley, DO sent at 01/04/2018 12:00 PM EDT ----- Please call and inform patient of normal results.

## 2018-01-04 NOTE — Telephone Encounter (Signed)
Pt contacted and informed of normal results.  

## 2018-01-05 NOTE — Assessment & Plan Note (Signed)
Patient reports previously receiving shots for her back, but is unsure what this is. We will obtain records from previous doctor's office. She reports that there is nothing more for me to do today for her as she has dealt with this for so long, but she will follow up again soon to try to find some relief for her.  She is currently seeing a psychiatrist who has her on multiple meds to also help, and she is seeing a nutritionist to help with weight loss. Will refer her to one closer here as well.

## 2018-01-05 NOTE — Assessment & Plan Note (Signed)
Patient with elevated BP to 146/72 today.  Will monitor at follow-up appointment.

## 2018-01-18 ENCOUNTER — Other Ambulatory Visit: Payer: Self-pay | Admitting: *Deleted

## 2018-01-18 ENCOUNTER — Telehealth: Payer: Self-pay

## 2018-01-18 DIAGNOSIS — G8929 Other chronic pain: Secondary | ICD-10-CM

## 2018-01-18 DIAGNOSIS — M25561 Pain in right knee: Principal | ICD-10-CM

## 2018-01-18 NOTE — Telephone Encounter (Signed)
Pt called nurse line left message that she needs several meds refilled, did not leave which ones. Called back and left her a message to call back with that info. Wallace Cullens, RN

## 2018-01-18 NOTE — Telephone Encounter (Signed)
Patient calling to request refill of gabapentin and flexeril (enough until she comes in on Monday 01/24/18).  She has been out of med x 3 weeks.  Switched from [facility] to our office recently and unable to get refills of med.  Will route request to PCP and call patient back.  Burna Forts, BSN, RN-BC

## 2018-01-19 ENCOUNTER — Other Ambulatory Visit: Payer: Self-pay | Admitting: Family Medicine

## 2018-01-19 DIAGNOSIS — M25561 Pain in right knee: Principal | ICD-10-CM

## 2018-01-19 DIAGNOSIS — G8929 Other chronic pain: Secondary | ICD-10-CM

## 2018-01-19 MED ORDER — CYCLOBENZAPRINE HCL 10 MG PO TABS: 10.0000 mg | ORAL_TABLET | Freq: Three times a day (TID) | ORAL | 0 refills | Status: DC | PRN

## 2018-01-19 MED ORDER — GABAPENTIN 300 MG PO CAPS: 600.0000 mg | ORAL_CAPSULE | Freq: Three times a day (TID) | ORAL | 3 refills | Status: DC

## 2018-01-19 NOTE — Progress Notes (Signed)
Refilled patient's gabapentin and flexeril (until visit on 4/29)

## 2018-01-24 ENCOUNTER — Other Ambulatory Visit: Payer: Self-pay

## 2018-01-24 ENCOUNTER — Ambulatory Visit: Payer: Medicaid Other | Admitting: Family Medicine

## 2018-01-24 ENCOUNTER — Encounter: Payer: Self-pay | Admitting: Family Medicine

## 2018-01-24 VITALS — BP 118/70 | HR 93 | Temp 98.5°F | Ht 60.0 in | Wt 298.0 lb

## 2018-01-24 DIAGNOSIS — E119 Type 2 diabetes mellitus without complications: Secondary | ICD-10-CM

## 2018-01-24 DIAGNOSIS — M25512 Pain in left shoulder: Secondary | ICD-10-CM

## 2018-01-24 DIAGNOSIS — G8929 Other chronic pain: Secondary | ICD-10-CM

## 2018-01-24 DIAGNOSIS — R739 Hyperglycemia, unspecified: Secondary | ICD-10-CM | POA: Diagnosis not present

## 2018-01-24 DIAGNOSIS — M25561 Pain in right knee: Secondary | ICD-10-CM

## 2018-01-24 DIAGNOSIS — I10 Essential (primary) hypertension: Secondary | ICD-10-CM

## 2018-01-24 DIAGNOSIS — G4733 Obstructive sleep apnea (adult) (pediatric): Secondary | ICD-10-CM | POA: Diagnosis not present

## 2018-01-24 DIAGNOSIS — M25562 Pain in left knee: Secondary | ICD-10-CM

## 2018-01-24 DIAGNOSIS — M25511 Pain in right shoulder: Secondary | ICD-10-CM

## 2018-01-24 LAB — POCT GLYCOSYLATED HEMOGLOBIN (HGB A1C): Hemoglobin A1C: 8.2

## 2018-01-24 MED ORDER — NAPROXEN 500 MG PO TABS: 500.0000 mg | ORAL_TABLET | Freq: Two times a day (BID) | ORAL | 1 refills | Status: DC

## 2018-01-24 MED ORDER — DICLOFENAC SODIUM 1 % TD GEL: 2.0000 g | Freq: Four times a day (QID) | TRANSDERMAL | 2 refills | Status: DC

## 2018-01-24 NOTE — Progress Notes (Signed)
Subjective:    Patient ID: Elizabeth Diaz, female    DOB: [birth year], 52 y.o.   MRN: [id number]   CC: Follow-up for knee and shoulder pain  HPI: Knee pain: - Patient has been experiencing knee pain for 3 years and it has progressively gotten worse - She is being followed by an orthopedic doctor at [facility] for her pain and medications, and was told that she will eventually need surgery and will also need to lose weight - Patient previously worked up for bariatric surgery and has been following with a nutritionist for a year to try with weight loss but reports that her weight has waxed and waned - She has tried multiple medications for pain including: toradol, steroid injections, naproxen, mobic, tylenol, capsaicin cream and aspercreme, flexeril, dilaudid, ibuprofen, gabapentin, and ultram - patient reports also trying PT previously - Report that L>R for pain but both knees hurt - ROS: denies any instability or numbness and tingling - Will continue to follow with [facility] for management but would like referral for different orthopedic doctor here - would like renewal of handicap placard for this pain- asking for permanent  Shoulder Pain: - Patient reports having shoulder pain since 2000 when she slipped and fell on a wet floor - Reports it has just been worsening and is interfering with her daily life. - She needs help with brushing her own hair most days - Naproxen helps her, Celebrex previously helped and flexeril continues to help most days - ROS: denies numbness and tingling of arms  Hyperglycemia: Patient reports that's he started checking her sugars because she was diagnosed with prediabetes and had readings above 220. Would like to be tested for diabetes.  Morbid Obesity: - Patient previously started work up for bariatric surgery and reports that she was about halfway through with [employer] and would like to be referred again after she obtained medicaid. - Explained that she understands  she should try on her own and that she has been  Smoking status reviewed  Review of Systems Per HPI   Patient Active Problem List   Diagnosis Date Noted  . Type 2 diabetes mellitus ([place]) 01/27/2018  . COPD exacerbation ([place]) 12/30/2016  . COPD with acute exacerbation ([place]) 12/29/2016  . COPD (chronic obstructive pulmonary disease) ([place]) 12/29/2016  . HTN (hypertension) 12/29/2016  . Hyperlipidemia 12/29/2016  . GERD (gastroesophageal reflux disease) 12/29/2016  . PAD (peripheral artery disease) ([place]) 02/25/2016  . OSA (obstructive sleep apnea) 02/07/2016  . Exertional dyspnea 02/07/2016  . Claudication in peripheral vascular disease ([place]) 01/06/2016  . Lower extremity edema 12/12/2015  . Hearing loss 11/27/2015  . Morbid obesity ([place]) 10/29/2015  . Excessive daytime sleepiness 10/05/2015  . History of depression 10/05/2015  . Nail discoloration 10/05/2015  . Chronic diarrhea   . Gastroesophageal reflux disease without esophagitis   . Abdominal pain, epigastric   . Bilateral lower extremity edema 04/08/2015  . Urinary incontinence 04/08/2015  . Chronic knee pain 12/07/2014  . Benign neoplasm of rectum   . IBS (irritable bowel syndrome)   . Chronic back pain 05/14/2014     Objective:  BP 118/70   Pulse 93   Temp 98.5 F (36.9 C) (Oral)   Ht 5' (1.524 m)   Wt 298 lb (135.2 kg)   SpO2 99%   BMI 58.20 kg/m  Vitals and nursing note reviewed  General: NAD, pleasant Cardiac: RRR, normal heart sounds, no murmurs Respiratory: CTAB, normal effort Abdomen: soft, nontender, nondistended Extremities: no edema  or cyanosis. FROM in BL knees with noticeable clicking and popping on exam, 5/5 strength in BLLE and BLUE. Limited ROM in shoulder in forward flexion- limited exam during visit Skin: warm and dry, no rashes noted Neuro: alert and oriented, no focal deficits Psych: normal affect  Assessment & Plan:    Type 2 diabetes mellitus (HCC) A1c 8.2 after patient left  visit. Called and informed of results. Has diabetic education scheduled and has visit soon to talk about adding medication, metformin.   After reviewing previous records patient had A1c of 7.2 in Jan of 2019 and was not started on medication due to loss of follow up.   HTN (hypertension) BP 118/70 at today's visit and currently not on medications, will monitor.  Chronic knee pain Patient to try voltaren gel for pain in knees. She is to continue to follow up with [facility] for knee pain and management. Will also continue to attempt weight loss and will refer to Dr. Jenne Campus- patient given number to call. Patient also interested in Bariatric surgery, will refer and discuss at next visit.   Given handicap placard for 6 months due to pain with ambulation and reportedly needing a cane many days, but not given permanent.   Shoulder Pain Patient to have XRays of both shoulders as she reports that she has not had XRays before to rule out etiology. Will also try voltaren gel and continue with current meds.   Morbid Obesity: Patient referred for bariatric sx, nutrition and will educate at next visit. Unable to exercise due to pain.   [place] Albertina Leise, DO Family Medicine Resident PGY-1

## 2018-01-24 NOTE — Patient Instructions (Addendum)
Thank you for coming to see me today. It was a pleasure! Today we talked about:   Please call Dr. Jenne Campus our nutritionist for weight loss prior to having bariatric surgery.   Please go have X-Rays done of your shoulders.   I have sent in diclofenac gel (Voltaren gel) to your pharmacy. Please try this for your arthritic pain in your shoulders and knees.   We will call you with your A1c to let you know how your blood sugar has averaged over the past 3 months.   Please follow-up with me in 2 months or sooner as needed.  If you have any questions or concerns, please do not hesitate to call the office at [phone].  Take Care,   [place] Azariya Freeman, DO  Hemoglobin A1c Test Some of the sugar (glucose) that circulates in your blood sticks or binds to blood proteins. Hemoglobin (Hb or Hgb) is one type of blood protein that glucose binds to. It also carries oxygen in the red blood cells (RBCs). When glucose binds to Hb, the glucose-coated Hb is called glycated Hb. Once Hb is glycated, it remains that way for the life of the RBC. This is about 120 days. Rather than testing your blood glucose level on one single day, the hemoglobin A1c (HbA1c) test measures the average amount of glycated hemoglobin and, therefore, the average amount of glucose in your blood during the 3-4 months just before the test is done. The HbA1c test is used to monitor long-term control of blood sugar in people who have diabetes mellitus. The HbA1c test can also be used in addition to or in combination with fasting blood glucose level and oral glucose tolerance tests. What do the results mean? It is your responsibility to obtain your test results. Ask the lab or department performing the test when and how you will get your results. Contact your health care provider to discuss any questions you have about your results. Range of Normal Values Ranges for normal values may vary among different labs and hospitals. You should always  check with your health care provider after having lab work or other tests done to discuss the meaning of your test results and whether your values are considered within normal limits. The ranges for normal HbA1c test results are as follows:  Adult or child without diabetes: 4-5.9%.  Adult or child with diabetes and good blood glucose control: less than 6.5%.  Several factors can affect HbA1c test results. These may include:  Diseases (hemoglobinopathies) that cause a change in the shape, size, or amount of Hb in your blood.  Longer than normal RBC life span.  Abnormally low levels of certain proteins in your blood.  Eating foods or taking supplements that are high in vitamin C (ascorbic acid).  Meaning of Results Outside Normal Value Ranges Abnormally high HbA1c values are most commonly an indication of prediabetes mellitus and diabetes mellitus:  An HbA1c result of 5.7-6.4% is considered diagnostic of prediabetes mellitus.  An HbA1c result of 6.5% or higher on two separate occasions is considered diagnostic of diabetes mellitus.  Abnormally low HbA1c values can be caused by several health conditions. These may include:  Pregnancy.  A large amount of blood loss.  Blood transfusions.  Low red blood cell count (anemia). This is caused by premature destruction of red blood cells.  Long-term kidney failure.  Some unusual forms of Hb (Hb variants), such as sickle cell trait.  Discuss your test results with your health care provider. He  or she will use the results to make a diagnosis and determine a treatment plan that is right for you. Talk with your health care provider to discuss your results, treatment options, and if necessary, the need for more tests. Talk with your health care provider if you have any questions about your results. This information is not intended to replace advice given to you by your health care provider. Make sure you discuss any questions you have with your  health care provider. Document Released: 10/06/2004 Document Revised: 06/10/2016 Document Reviewed: 01/29/2014 Elsevier Interactive Patient Education  2018 [employer].

## 2018-01-25 ENCOUNTER — Telehealth: Payer: Self-pay | Admitting: Family Medicine

## 2018-01-25 NOTE — Telephone Encounter (Signed)
Attempted to call patient with results regarding A1c elevated to 8.2 which indicates new onset diabetes.  Left voicemail instructing patient to call clinic.  Patient will need to schedule follow-up appointment to discuss these test results as soon as possible.  Will continue to try to reach patient.

## 2018-01-25 NOTE — Telephone Encounter (Signed)
She was returning your phone call, please try her again at [phone].

## 2018-01-27 ENCOUNTER — Encounter: Payer: Self-pay | Admitting: Family Medicine

## 2018-01-27 DIAGNOSIS — E119 Type 2 diabetes mellitus without complications: Secondary | ICD-10-CM | POA: Insufficient documentation

## 2018-01-27 HISTORY — DX: Type 2 diabetes mellitus without complications: E11.9

## 2018-01-27 NOTE — Telephone Encounter (Signed)
Called patient and informed her of A1c results showing diabetes. She as classes that have been scheduled and she will follow up with me in the office on May 15 at 2:15pm.

## 2018-01-27 NOTE — Assessment & Plan Note (Addendum)
A1c 8.2 after patient left visit. Called and informed of results. Has diabetic education scheduled and has visit soon to talk about adding medication, metformin.   After reviewing previous records patient had A1c of 7.2 in Jan of 2019 and was not started on medication due to loss of follow up.

## 2018-01-27 NOTE — Assessment & Plan Note (Addendum)
Patient to try voltaren gel for pain in knees. She is to continue to follow up with [facility] for knee pain and management. Will also continue to attempt weight loss and will refer to Dr. Jenne Campus- patient given number to call. Patient also interested in Bariatric surgery, will refer and discuss at next visit.   Given handicap placard for 6 months due to pain with ambulation and reportedly needing a cane many days, but not given permanent.

## 2018-01-27 NOTE — Assessment & Plan Note (Signed)
BP 118/70 at today's visit and currently not on medications, will monitor.

## 2018-01-31 ENCOUNTER — Other Ambulatory Visit: Payer: Self-pay | Admitting: *Deleted

## 2018-01-31 MED ORDER — CYCLOBENZAPRINE HCL 10 MG PO TABS: 10.0000 mg | ORAL_TABLET | Freq: Three times a day (TID) | ORAL | 0 refills | Status: DC | PRN

## 2018-02-09 ENCOUNTER — Encounter: Payer: Self-pay | Admitting: Family Medicine

## 2018-02-09 ENCOUNTER — Ambulatory Visit (INDEPENDENT_AMBULATORY_CARE_PROVIDER_SITE_OTHER): Payer: Medicaid Other | Admitting: Family Medicine

## 2018-02-09 VITALS — BP 114/70 | HR 72 | Temp 98.2°F | Ht 60.0 in | Wt 310.0 lb

## 2018-02-09 DIAGNOSIS — B351 Tinea unguium: Secondary | ICD-10-CM

## 2018-02-09 DIAGNOSIS — E119 Type 2 diabetes mellitus without complications: Secondary | ICD-10-CM | POA: Diagnosis present

## 2018-02-09 MED ORDER — METFORMIN HCL ER 500 MG PO TB24: 500.0000 mg | ORAL_TABLET | Freq: Two times a day (BID) | ORAL | 1 refills | Status: DC

## 2018-02-09 MED ORDER — TERBINAFINE HCL 250 MG PO TABS: 250.0000 mg | ORAL_TABLET | Freq: Every day | ORAL | 0 refills | Status: DC

## 2018-02-09 NOTE — Progress Notes (Signed)
   Subjective:    Patient ID: Elizabeth Diaz, female    DOB: 08/01/1966, 52 y.o.   MRN: [id number]   CC: Follow-up new onset diabetes  HPI:  Diabetes:  Last A1c 8.2 on 01/24/18 Taking medications: metformin 250 mg BID for pre-diabetes ROS: denies dizziness, diaphoresis, LOC Reports: polyuria, polydipsia Patient previously diagnosed with pre-diabetes, and upon chart review seems as if she had diabetes previously but patient was never told this. She does have diabetic classes scheduled for education, and is also interested in speaking with our nutritionist.   Smoking status reviewed  Review of Systems Per HPI   Patient Active Problem List   Diagnosis Date Noted  . Onychomycosis 02/14/2018  . Type 2 diabetes mellitus ([place]) 01/27/2018  . COPD (chronic obstructive pulmonary disease) ([place]) 12/29/2016  . HTN (hypertension) 12/29/2016  . Hyperlipidemia 12/29/2016  . GERD (gastroesophageal reflux disease) 12/29/2016  . PAD (peripheral artery disease) ([place]) 02/25/2016  . OSA (obstructive sleep apnea) 02/07/2016  . Exertional dyspnea 02/07/2016  . Claudication in peripheral vascular disease ([place]) 01/06/2016  . Lower extremity edema 12/12/2015  . Hearing loss 11/27/2015  . Morbid obesity ([place]) 10/29/2015  . Excessive daytime sleepiness 10/05/2015  . History of depression 10/05/2015  . Nail discoloration 10/05/2015  . Chronic diarrhea   . Gastroesophageal reflux disease without esophagitis   . Abdominal pain, epigastric   . Bilateral lower extremity edema 04/08/2015  . Urinary incontinence 04/08/2015  . Chronic knee pain 12/07/2014  . Benign neoplasm of rectum   . IBS (irritable bowel syndrome)   . Chronic back pain 05/14/2014     Objective:  BP 114/70 (BP Location: Right Arm, Patient Position: Sitting, Cuff Size: Large)   Pulse 72   Temp 98.2 F (36.8 C) (Oral)   Ht 5' (1.524 m)   Wt (!) 310 lb (140.6 kg)   SpO2 96%   BMI 60.54 kg/m  Vitals and nursing note  reviewed  General: NAD, pleasant Cardiac: RRR, normal heart sounds, no murmurs Respiratory: CTAB, normal effort Abdomen: soft, nontender, nondistended Extremities: no edema or cyanosis. WWP. Fungus noted under fingernails on 2nd and 3rd digit on R hand Skin: warm and dry, no rashes noted Neuro: alert and oriented, no focal deficits Psych: normal affect  Assessment & Plan:    Type 2 diabetes mellitus ([place]) Patient to start taking metformin 1000mg  once daily for one week and then increase to 2000mg . She is going to diabetic education courses and plans to schedule with our nutritionist. Had extensive conversation about diagnosis and given handout on suggested dietary changes. Patient understands and is going to work on [employer].   Onychomycosis Patient with onychomycosis of fingernails and toenails. Will send in terbinafine 250mg  to take for 12 weeks. Patient to return in 6 weeks for LFT's.     [place] Darwin Rothlisberger, DO Family Medicine Resident PGY-1

## 2018-02-09 NOTE — Patient Instructions (Addendum)
Thank you for coming to see me today. It was a pleasure! Today we talked about:   I have increased you metformin.  I have also given terbinafine for your toenails and fingernails. Please take daily.   Please follow-up with me in 1-2 months or as needed.  If you have any questions or concerns, please do not hesitate to call the office at [phone].  Take Care,   [place] Emilliano Dilworth, DO   Diet Recommendations for Diabetes  Carbohydrate includes starch, sugar, and fiber.  Of these, only sugar and starch raise blood glucose.  (Fiber is found in fruits, vegetables [especially skin, seeds, and stalks] and whole grains.)   Starchy (carb) foods: Bread, rice, pasta, potatoes, corn, cereal, grits, crackers, bagels, muffins, all baked goods.  (Fruit, milk, and yogurt also have carbohydrate, but most of these foods will not spike your blood sugar as most starchy foods will.)  A few fruits do cause high blood sugars; use small portions of bananas (limit to 1/2 at a time), grapes, watermelon, oranges, and most tropical fruits.   Protein foods: Meat, fish, poultry, eggs, dairy foods, and beans such as pinto and kidney beans (beans also provide carbohydrate).   1. Eat at least REAL 3 meals and 1-2 snacks per day. Never go more than 4-5 hours while awake without eating. Eat breakfast within the first hour of getting up.   2. Limit starchy foods to TWO per meal and ONE per snack. ONE portion of a starchy  food is equal to the following:   - ONE slice of bread (or its equivalent, such as half of a hamburger bun).   - 1/2 cup of a "scoopable" starchy food such as potatoes or rice.   - 15 grams of Total Carbohydrate as shown on food label.  3. Include at every meal: a protein food, a carb food, and vegetables and/or fruit.   - Obtain twice the volume of veg's as protein or carbohydrate foods for both lunch and dinner.   - Fresh or frozen veg's are best.   - Keep frozen veg's on hand for a quick vegetable  serving.

## 2018-02-14 DIAGNOSIS — B351 Tinea unguium: Secondary | ICD-10-CM | POA: Insufficient documentation

## 2018-02-14 MED ORDER — METFORMIN HCL ER 500 MG PO TB24: 1000.0000 mg | ORAL_TABLET | Freq: Every day | ORAL | 0 refills | Status: DC

## 2018-02-14 NOTE — Assessment & Plan Note (Signed)
Patient to start taking metformin 1000mg  once daily for one week and then increase to 2000mg . She is going to diabetic education courses and plans to schedule with our nutritionist. Had extensive conversation about diagnosis and given handout on suggested dietary changes. Patient understands and is going to work on [employer].

## 2018-02-14 NOTE — Assessment & Plan Note (Signed)
Patient with onychomycosis of fingernails and toenails. Will send in terbinafine 250mg  to take for 12 weeks. Patient to return in 6 weeks for LFT's.

## 2018-02-15 ENCOUNTER — Ambulatory Visit: Payer: Medicaid Other

## 2018-02-22 ENCOUNTER — Ambulatory Visit: Payer: Medicaid Other

## 2018-03-01 ENCOUNTER — Ambulatory Visit: Payer: Medicaid Other

## 2018-03-15 ENCOUNTER — Ambulatory Visit: Payer: Medicaid Other | Admitting: Family Medicine

## 2018-03-15 NOTE — Progress Notes (Deleted)
   Subjective:    Patient ID: Elizabeth Diaz, female    DOB: 1966-05-23, 52 y.o.   MRN: [id number]   CC:  HPI:  Diabetes:  Last A1c *** on *** Taking medications: metformin *** On Aspirin, and on statin*** Last eye exam: recently *** Last foot exam: up to date ROS: denies fever, chills, dizziness, diaphoresis, LOC, polyuria, polydipsia Patient did not go to the diabetic classes  ***  Smoking status reviewed  Review of Systems Per HPI, also denies recent illness, fever, headache, changes in vision, chest pain, shortness of breath, abdominal pain, N/V/D, weakness   Patient Active Problem List   Diagnosis Date Noted  . Onychomycosis 02/14/2018  . Type 2 diabetes mellitus ([place]) 01/27/2018  . COPD (chronic obstructive pulmonary disease) ([place]) 12/29/2016  . HTN (hypertension) 12/29/2016  . Hyperlipidemia 12/29/2016  . GERD (gastroesophageal reflux disease) 12/29/2016  . PAD (peripheral artery disease) ([place]) 02/25/2016  . OSA (obstructive sleep apnea) 02/07/2016  . Exertional dyspnea 02/07/2016  . Claudication in peripheral vascular disease ([place]) 01/06/2016  . Lower extremity edema 12/12/2015  . Hearing loss 11/27/2015  . Morbid obesity ([place]) 10/29/2015  . Excessive daytime sleepiness 10/05/2015  . History of depression 10/05/2015  . Nail discoloration 10/05/2015  . Chronic diarrhea   . Gastroesophageal reflux disease without esophagitis   . Abdominal pain, epigastric   . Bilateral lower extremity edema 04/08/2015  . Urinary incontinence 04/08/2015  . Chronic knee pain 12/07/2014  . Benign neoplasm of rectum   . IBS (irritable bowel syndrome)   . Chronic back pain 05/14/2014     Objective:  There were no vitals taken for this visit. Vitals and nursing note reviewed  General: NAD, pleasant Cardiac: RRR, normal heart sounds, no murmurs Respiratory: CTAB, normal effort Abdomen: soft, nontender, nondistended Extremities: no edema or cyanosis. WWP. Skin: warm and  dry, no rashes noted Neuro: alert and oriented, no focal deficits Psych: normal affect  Assessment & Plan:    No problem-specific Assessment & Plan notes found for this encounter.    [place] Jarmal Lewelling, DO Family Medicine Resident PGY-1

## 2018-03-29 ENCOUNTER — Other Ambulatory Visit: Payer: Self-pay | Admitting: Family Medicine

## 2018-05-04 ENCOUNTER — Ambulatory Visit: Payer: Medicaid Other | Admitting: Family Medicine

## 2018-05-05 ENCOUNTER — Ambulatory Visit (INDEPENDENT_AMBULATORY_CARE_PROVIDER_SITE_OTHER): Payer: Medicaid Other | Admitting: Family Medicine

## 2018-05-05 VITALS — Ht 60.0 in | Wt 297.8 lb

## 2018-05-05 DIAGNOSIS — Z713 Dietary counseling and surveillance: Secondary | ICD-10-CM | POA: Diagnosis not present

## 2018-05-05 NOTE — Progress Notes (Addendum)
Nutrition History  Learning Readiness:   Ready  Usual eating pattern includes 2 meals and 2-3 snacks per day. Frequent foods and beverages include water, chicken.   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  24-hr recall: (Up at  10AM)  B ( 10:45AM)-eggs 1, 1 [place] bacon,flat bread 1 full piece, 1 pc cheese, water   Snk ( AM)-    L (3 PM)- fried rice 2 cups, 4 chicken wings breaded, 16oz soda   Snk ( PM)-2 16oz bottle water   D (8 PM)- 8 peanut butter crackers, 2 16oz bottle water, 14oz chicken noodle soup   Snk ( PM)- 1 160z water   Typical day? No.    Handouts given during visit include:  After-Visit Summary (AVS)  Demonstrated degree of understanding via:  Teach Back  Barriers to learning/adherence to lifestyle change: learning the calculation of carb servings

## 2018-05-05 NOTE — Progress Notes (Signed)
She missed her last 2 visits wit me. Thanks for letting me know.   I'm excited she came to this appointment with nutrition.

## 2018-05-05 NOTE — Patient Instructions (Addendum)
Please ask the pharmacy to refill your plavix and your Detrol    Diet Recommendations for Diabetes  Carbohydrate includes starch, sugar, and fiber.  Of these, only sugar and starch raise blood glucose.  (Fiber is found in fruits, vegetables [especially skin, seeds, and stalks] and whole grains.)   Starchy (carb) foods: Bread, rice, pasta, potatoes, corn, cereal, grits, crackers, bagels, muffins, all baked goods.  (Fruit, milk, and yogurt also have carbohydrate, but most of these foods will not spike your blood sugar as most starchy foods will.)  A few fruits do cause high blood sugars; use small portions of bananas (limit to 1/2 at a time), grapes, watermelon, oranges, and most tropical fruits.   Protein foods: Meat, fish, poultry, eggs, dairy foods, and beans such as pinto and kidney beans (beans also provide carbohydrate).   1. Eat at least REAL 3 meals and 1-2 snacks per day. Never go more than 4-5 hours while awake without eating. Eat breakfast within the first hour of getting up.   2. Limit starchy foods to TWO per meal and ONE per snack. ONE portion of a starchy  food is equal to the following:   - ONE slice of bread (or its equivalent, such as half of a hamburger bun).   - 1/2 cup of a "scoopable" starchy food such as potatoes or rice.   - 15 grams of Total Carbohydrate as shown on food label.   - Every 4ox of sweet drinks 3. Include at every meal: a protein food, a carb food, and vegetables and/or fruit.   - Obtain twice the volume of veg's as protein or carbohydrate foods for both lunch and dinner.   - Fresh or frozen veg's are best.   - Keep frozen veg's on hand for a quick vegetable serving.    4. 4x week, go the [employer] with the kids and swim/walk.  Plan at least more than 1 hr.

## 2018-06-07 ENCOUNTER — Encounter: Payer: Self-pay | Admitting: Family Medicine

## 2018-06-07 ENCOUNTER — Ambulatory Visit (INDEPENDENT_AMBULATORY_CARE_PROVIDER_SITE_OTHER): Payer: Medicare Other | Admitting: Family Medicine

## 2018-06-07 ENCOUNTER — Other Ambulatory Visit: Payer: Self-pay

## 2018-06-07 VITALS — BP 124/60 | HR 90 | Temp 99.2°F | Ht 60.0 in | Wt 296.0 lb

## 2018-06-07 DIAGNOSIS — R3981 Functional urinary incontinence: Secondary | ICD-10-CM

## 2018-06-07 DIAGNOSIS — B351 Tinea unguium: Secondary | ICD-10-CM

## 2018-06-07 DIAGNOSIS — M25561 Pain in right knee: Secondary | ICD-10-CM | POA: Diagnosis not present

## 2018-06-07 DIAGNOSIS — K219 Gastro-esophageal reflux disease without esophagitis: Secondary | ICD-10-CM | POA: Diagnosis not present

## 2018-06-07 DIAGNOSIS — E119 Type 2 diabetes mellitus without complications: Secondary | ICD-10-CM | POA: Diagnosis present

## 2018-06-07 DIAGNOSIS — G8929 Other chronic pain: Secondary | ICD-10-CM

## 2018-06-07 LAB — POCT GLYCOSYLATED HEMOGLOBIN (HGB A1C): HbA1c, POC (controlled diabetic range): 6.9 % (ref 0.0–7.0)

## 2018-06-07 LAB — POCT UA - MICROALBUMIN
Creatinine, POC: 300 mg/dL
MICROALBUMIN (UR) POC: 80 mg/L

## 2018-06-07 MED ORDER — GLUCOSE BLOOD VI STRP: ORAL_STRIP | 12 refills | Status: DC

## 2018-06-07 MED ORDER — MIRABEGRON ER 25 MG PO TB24: 25.0000 mg | ORAL_TABLET | Freq: Every day | ORAL | 0 refills | Status: DC

## 2018-06-07 MED ORDER — CYCLOBENZAPRINE HCL 10 MG PO TABS: 10.0000 mg | ORAL_TABLET | Freq: Three times a day (TID) | ORAL | 2 refills | Status: DC | PRN

## 2018-06-07 MED ORDER — NAPROXEN 500 MG PO TABS: 500.0000 mg | ORAL_TABLET | Freq: Two times a day (BID) | ORAL | 1 refills | Status: DC

## 2018-06-07 MED ORDER — METFORMIN HCL ER 500 MG PO TB24: 1000.0000 mg | ORAL_TABLET | Freq: Every day | ORAL | 0 refills | Status: DC

## 2018-06-07 MED ORDER — RANITIDINE HCL 150 MG PO TABS: 150.0000 mg | ORAL_TABLET | Freq: Two times a day (BID) | ORAL | 1 refills | Status: DC

## 2018-06-07 MED ORDER — TOLTERODINE TARTRATE ER 4 MG PO CP24: 4.0000 mg | ORAL_CAPSULE | Freq: Every day | ORAL | 1 refills | Status: DC

## 2018-06-07 MED ORDER — GABAPENTIN 300 MG PO CAPS: 600.0000 mg | ORAL_CAPSULE | Freq: Three times a day (TID) | ORAL | 3 refills | Status: DC

## 2018-06-07 NOTE — Progress Notes (Signed)
Subjective:  Elizabeth Diaz is a 52 y.o. female who presents to the [facility] today for a medication refill and follow up for DM.   HPI: Patient reports she is doing well. Requests refills on Detrol, Naproxen, Neurontin, Flexeril.   Her major complaint today is issues with her incontinence. She says that she has increased urgency and feels like her bladder is not completely emptying and Detrol helps but she has been out of it. She is also requesting a prescription for Depends. She is also in the process of losing weight so that she can have a gastric bypass procedure. After the gastric bypass procedure she plans on have knee repairs damaged by osteoarthritis. Request prescription for cane due to knee pain while walking. Patient also admit to vaping [facility] which has helped with her pain.   Patient is very pleased with her progress in managing her diabetes. She is happy that her A1C has dropped to 6.9 from 8.2. She reports taking her metformin regularly and says she occasionally feels like her blood sugar is low throughout the day but with eat something sweet and feel better. Patient needs refill on test strips.   Denies HA, changes in vision, SOB, CP, NV, constipation, numbness in fingers or toes. Acknowledges urinary incontinence, diarrhea (has IBS). No dysuria.   ROS: 10 point ROS is otherwise negative, except as mentioned in HPI  Objective:  Physical Exam: BP 124/60   Pulse 90   Temp 99.2 F (37.3 C) (Oral)   Ht 5' (1.524 m)   Wt 296 lb (134.3 kg)   SpO2 97%   BMI 57.81 kg/m   Gen: Obese, NAD, resting comfortably Neck: supple CV: RRR Pulm: NWOB, CTAB with no crackles, wheezes, or rhonchi GI: Normal bowel sounds present. Soft, Nontender, Nondistended. MSK: no edema, cyanosis, or clubbing noted Skin: warm, dry Extremities: normal gait, no edema noted Neuro: grossly normal, moves all extremities Psych: Normal affect and thought content  Results for orders placed or performed in  visit on 06/07/18 (from the past 72 hour(s))  HgB A1c     Status: None   Collection Time: 06/07/18  2:58 PM  Result Value Ref Range   Hemoglobin A1C     HbA1c POC (<> result, manual entry)     HbA1c, POC (prediabetic range)     HbA1c, POC (controlled diabetic range) 6.9 0.0 - 7.0 %   Diabetic Foot Exam - Simple   Simple Foot Form Diabetic Foot exam was performed with the following findings:  Yes 06/07/2018  4:46 PM  Visual Inspection No deformities, no ulcerations, no other skin breakdown bilaterally:  Yes See comments:  Yes Sensation Testing Intact to touch and monofilament testing bilaterally:  Yes Pulse Check Posterior Tibialis and Dorsalis pulse intact bilaterally:  Yes Comments Patient with improving onychomycosis on BL toes.      Assessment/Plan:  Elizabeth Diaz is a 52 y.o. female who presents to the [facility] today for a medication refill and follow up for DM.   T2DM -- Improvement in HbA1C from 8.2 --> 6.9 -- microalbumin collected and foot test performed today -- Refill on test strips and metformin   Chronic R knee pain -- Refills sent for requested pain medications to pharmacy including flexeril, gabapentin, and naproxen -- DME cane prescription filled  -- Patient informed that vaping may put her lungs at risk and that THC use is illegal currently  GERD -- Discontinue Protonix and start Ranitidine given that patient has been on  this long term. May restart if indicated  Onychomycosis -- CMP checked given treatment with terbinafine to ensure no elevation of LFT's -- Patient almost done with current course and is improving  Health Maintenance -- Lipid panel checked today -- Consider starting low dose ACEi or ARB   Incontinence  -- Depends (intcontinence supplies) and myrbetric prior authorization started as detrol not very effective for patient.   [place] Gifford Diaz, [place] [facility]  06/07/2018 4:10 PM    I have personally seen and examined  this patient with the medical student and agree with the above note.   [place] Shirley, D.O. 06/08/2018, 12:34 PM PGY-2, [place]

## 2018-06-07 NOTE — Progress Notes (Signed)
Error

## 2018-06-07 NOTE — Patient Instructions (Signed)
Thank you for coming to see me today. It was a pleasure! Today we talked about:   I have refilled your medications.   We will attempt to get myrbetric approved for your incontinence rather than detrol as it may work better.   Congratulations on your weight loss! Keep up the good work! Congratulations on your A1c being improved! Please continue your metformin.    Please follow-up with me in 3 months or sooner as needed.  If you have any questions or concerns, please do not hesitate to call the office at [phone].  Take Care,   [place] Karyn Brull, DO

## 2018-06-08 ENCOUNTER — Telehealth: Payer: Self-pay

## 2018-06-08 NOTE — Telephone Encounter (Signed)
Received fax from [place] requesting prior authorization of myrbetriq .  Form placed in MD's box for completion along with Medicaid formulary.  Wallace Cullens, RN

## 2018-06-09 LAB — CMP14+EGFR
A/G RATIO: 1.2 (ref 1.2–2.2)
ALT: 31 IU/L (ref 0–32)
AST: 21 IU/L (ref 0–40)
Albumin: 3.4 g/dL — ABNORMAL LOW (ref 3.5–5.5)
Alkaline Phosphatase: 75 IU/L (ref 39–117)
BILIRUBIN TOTAL: 0.2 mg/dL (ref 0.0–1.2)
BUN / CREAT RATIO: 16 (ref 9–23)
BUN: 11 mg/dL (ref 6–24)
CALCIUM: 9.2 mg/dL (ref 8.7–10.2)
CHLORIDE: 105 mmol/L (ref 96–106)
CO2: 26 mmol/L (ref 20–29)
Creatinine, Ser: 0.68 mg/dL (ref 0.57–1.00)
GFR, EST AFRICAN AMERICAN: 116 mL/min/{1.73_m2} (ref >59)
GFR, EST NON AFRICAN AMERICAN: 101 mL/min/{1.73_m2} (ref >59)
GLOBULIN, TOTAL: 2.9 g/dL (ref 1.5–4.5)
Glucose: 107 mg/dL — ABNORMAL HIGH (ref 65–99)
POTASSIUM: 4.1 mmol/L (ref 3.5–5.2)
Sodium: 144 mmol/L (ref 134–144)
Total Protein: 6.3 g/dL (ref 6.0–8.5)

## 2018-06-09 LAB — LIPID PANEL
CHOL/HDL RATIO: 3.7 ratio (ref 0.0–4.4)
Cholesterol, Total: 153 mg/dL (ref 100–199)
HDL: 41 mg/dL (ref >39)
LDL Calculated: 86 mg/dL (ref 0–99)
Triglycerides: 130 mg/dL (ref 0–149)
VLDL Cholesterol Cal: 26 mg/dL (ref 5–40)

## 2018-06-09 LAB — MICROALBUMIN / CREATININE URINE RATIO

## 2018-06-10 NOTE — Telephone Encounter (Signed)
Myrbetriq approved for 06/10/18 - 06/10/19. Prior approval # [id number].  [place] informed.  Danley Danker, RN [facility] RN)

## 2018-06-10 NOTE — Telephone Encounter (Signed)
Completed PA info in [facility] [employer] for [employer].  Status pending. Will recheck status next business day.  Esau Grew, RN

## 2018-06-13 ENCOUNTER — Other Ambulatory Visit: Payer: Self-pay

## 2018-06-13 MED ORDER — MIRABEGRON ER 25 MG PO TB24: 25.0000 mg | ORAL_TABLET | Freq: Every day | ORAL | 2 refills | Status: DC

## 2018-06-14 ENCOUNTER — Telehealth: Payer: Self-pay

## 2018-06-14 NOTE — Telephone Encounter (Signed)
[facility] notified. Danley Danker, RN [facility] RN)

## 2018-06-14 NOTE — Telephone Encounter (Signed)
Patient no longer on Detrol, she was approved for myrbetric instead, thanks!

## 2018-06-14 NOTE — Telephone Encounter (Signed)
Received fax from [place] requesting prior authorization of Detrol [place]. Form placed in MD's box for completion along with Medicaid formulary.  Danley Danker, RN [facility] RN)

## 2018-06-30 ENCOUNTER — Ambulatory Visit: Payer: Medicaid Other | Admitting: Family Medicine

## 2018-08-10 ENCOUNTER — Encounter: Payer: Self-pay | Admitting: Family Medicine

## 2018-08-10 ENCOUNTER — Ambulatory Visit (INDEPENDENT_AMBULATORY_CARE_PROVIDER_SITE_OTHER): Payer: Medicare Other | Admitting: Family Medicine

## 2018-08-10 VITALS — BP 150/80 | HR 95 | Temp 98.6°F | Wt 292.2 lb

## 2018-08-10 DIAGNOSIS — I739 Peripheral vascular disease, unspecified: Secondary | ICD-10-CM | POA: Diagnosis not present

## 2018-08-10 DIAGNOSIS — M25561 Pain in right knee: Secondary | ICD-10-CM | POA: Diagnosis not present

## 2018-08-10 DIAGNOSIS — R3981 Functional urinary incontinence: Secondary | ICD-10-CM

## 2018-08-10 DIAGNOSIS — G8929 Other chronic pain: Secondary | ICD-10-CM

## 2018-08-10 DIAGNOSIS — M25562 Pain in left knee: Secondary | ICD-10-CM

## 2018-08-10 DIAGNOSIS — Z1239 Encounter for other screening for malignant neoplasm of breast: Secondary | ICD-10-CM

## 2018-08-10 DIAGNOSIS — I10 Essential (primary) hypertension: Secondary | ICD-10-CM | POA: Diagnosis not present

## 2018-08-10 DIAGNOSIS — G4733 Obstructive sleep apnea (adult) (pediatric): Secondary | ICD-10-CM | POA: Diagnosis not present

## 2018-08-10 MED ORDER — MIRABEGRON ER 25 MG PO TB24: 25.0000 mg | ORAL_TABLET | Freq: Every day | ORAL | 2 refills | Status: DC

## 2018-08-10 MED ORDER — CYCLOBENZAPRINE HCL 10 MG PO TABS: 10.0000 mg | ORAL_TABLET | Freq: Three times a day (TID) | ORAL | 2 refills | Status: DC | PRN

## 2018-08-10 MED ORDER — DICLOFENAC SODIUM 1 % TD GEL: 4.0000 g | Freq: Four times a day (QID) | TRANSDERMAL | 2 refills | Status: DC

## 2018-08-10 NOTE — Patient Instructions (Addendum)
Thank you for coming to see me today. It was a pleasure! Today we talked about:   Your high blood pressure. Please try to exercise more and eat a low sodium diet, see below.   Please have your mammogram done. When you call to schedule ensure that you will not need to pay out of pocket, I do not think you will have to.   I have refilled your medications.   Please follow-up with me in  or as needed.  If you have any questions or concerns, please do not hesitate to call the office at [phone].  Take Care,   [place] Audryana Hockenberry, DO  Low-Sodium Eating Plan Sodium, which is an element that makes up salt, helps you maintain a healthy balance of fluids in your body. Too much sodium can increase your blood pressure and cause fluid and waste to be held in your body. Your health care provider or dietitian may recommend following this plan if you have high blood pressure (hypertension), kidney disease, liver disease, or heart failure. Eating less sodium can help lower your blood pressure, reduce swelling, and protect your heart, liver, and kidneys. What are tips for following this plan? General guidelines  Most people on this plan should limit their sodium intake to 1,500-2,000 mg (milligrams) of sodium each day. Reading food labels  The Nutrition Facts label lists the amount of sodium in one serving of the food. If you eat more than one serving, you must multiply the listed amount of sodium by the number of servings.  Choose foods with less than 140 mg of sodium per serving.  Avoid foods with 300 mg of sodium or more per serving. Shopping  Look for lower-sodium products, often labeled as "low-sodium" or "no salt added."  Always check the sodium content even if foods are labeled as "unsalted" or "no salt added".  Buy fresh foods. ? Avoid canned foods and premade or frozen meals. ? Avoid canned, cured, or processed meats  Buy breads that have less than 80 mg of sodium per  slice. Cooking  Eat more home-cooked food and less restaurant, buffet, and fast food.  Avoid adding salt when cooking. Use salt-free seasonings or herbs instead of table salt or sea salt. Check with your health care provider or pharmacist before using salt substitutes.  Cook with plant-based oils, such as canola, sunflower, or olive oil. Meal planning  When eating at a restaurant, ask that your food be prepared with less salt or no salt, if possible.  Avoid foods that contain MSG (monosodium glutamate). MSG is sometimes added to [place] food, bouillon, and some canned foods. What foods are recommended? The items listed may not be a complete list. Talk with your dietitian about what dietary choices are best for you. Grains Low-sodium cereals, including oats, puffed wheat and rice, and shredded wheat. Low-sodium crackers. Unsalted rice. Unsalted pasta. Low-sodium bread. Whole-grain breads and whole-grain pasta. Vegetables Fresh or frozen vegetables. "No salt added" canned vegetables. "No salt added" tomato sauce and paste. Low-sodium or reduced-sodium tomato and vegetable juice. Fruits Fresh, frozen, or canned fruit. Fruit juice. Meats and other protein foods Fresh or frozen (no salt added) meat, poultry, seafood, and fish. Low-sodium canned tuna and salmon. Unsalted nuts. Dried peas, beans, and lentils without added salt. Unsalted canned beans. Eggs. Unsalted nut butters. Dairy Milk. Soy milk. Cheese that is naturally low in sodium, such as ricotta cheese, fresh mozzarella, or Swiss cheese Low-sodium or reduced-sodium cheese. Cream cheese. Yogurt. Fats and oils  Unsalted butter. Unsalted margarine with no trans fat. Vegetable oils such as canola or olive oils. Seasonings and other foods Fresh and dried herbs and spices. Salt-free seasonings. Low-sodium mustard and ketchup. Sodium-free salad dressing. Sodium-free light mayonnaise. Fresh or refrigerated horseradish. Lemon juice. Vinegar.  Homemade, reduced-sodium, or low-sodium soups. Unsalted popcorn and pretzels. Low-salt or salt-free chips. What foods are not recommended? The items listed may not be a complete list. Talk with your dietitian about what dietary choices are best for you. Grains Instant hot cereals. Bread stuffing, pancake, and biscuit mixes. Croutons. Seasoned rice or pasta mixes. Noodle soup cups. Boxed or frozen macaroni and cheese. Regular salted crackers. Self-rising flour. Vegetables Sauerkraut, pickled vegetables, and relishes. Olives. [place] fries. Onion rings. Regular canned vegetables (not low-sodium or reduced-sodium). Regular canned tomato sauce and paste (not low-sodium or reduced-sodium). Regular tomato and vegetable juice (not low-sodium or reduced-sodium). Frozen vegetables in sauces. Meats and other protein foods Meat or fish that is salted, canned, smoked, spiced, or pickled. Bacon, ham, sausage, hotdogs, corned beef, chipped beef, packaged lunch meats, salt pork, jerky, pickled herring, anchovies, regular canned tuna, sardines, salted nuts. Dairy Processed cheese and cheese spreads. Cheese curds. Blue cheese. Feta cheese. String cheese. Regular cottage cheese. Buttermilk. Canned milk. Fats and oils Salted butter. Regular margarine. Ghee. Bacon fat. Seasonings and other foods Onion salt, garlic salt, seasoned salt, table salt, and sea salt. Canned and packaged gravies. Worcestershire sauce. Tartar sauce. Barbecue sauce. Teriyaki sauce. Soy sauce, including reduced-sodium. Steak sauce. Fish sauce. Oyster sauce. Cocktail sauce. Horseradish that you find on the shelf. Regular ketchup and mustard. Meat flavorings and tenderizers. Bouillon cubes. Hot sauce and Tabasco sauce. Premade or packaged marinades. Premade or packaged taco seasonings. Relishes. Regular salad dressings. Salsa. Potato and tortilla chips. Corn chips and puffs. Salted popcorn and pretzels. Canned or dried soups. Pizza. Frozen entrees and  pot pies. Summary  Eating less sodium can help lower your blood pressure, reduce swelling, and protect your heart, liver, and kidneys.  Most people on this plan should limit their sodium intake to 1,500-2,000 mg (milligrams) of sodium each day.  Canned, boxed, and frozen foods are high in sodium. Restaurant foods, fast foods, and pizza are also very high in sodium. You also get sodium by adding salt to food.  Try to cook at home, eat more fresh fruits and vegetables, and eat less fast food, canned, processed, or prepared foods. This information is not intended to replace advice given to you by your health care provider. Make sure you discuss any questions you have with your health care provider. Document Released: 03/06/2002 Document Revised: 09/07/2016 Document Reviewed: 09/07/2016 Elsevier Interactive Patient Education  [employer].

## 2018-08-10 NOTE — Assessment & Plan Note (Signed)
Patient follows with [facility] and reports recent stent placement. She is to continue plavix and ASA until December and should then continue with ASA alone. Has f/u with [facility] vascular surgery.

## 2018-08-10 NOTE — Assessment & Plan Note (Addendum)
Followed by provider at [facility] Refilled voltaren gel and flexeril

## 2018-08-10 NOTE — Assessment & Plan Note (Signed)
Refilled myrbetriq, patient unable to pick up previously due to insurance coverage.

## 2018-08-10 NOTE — Assessment & Plan Note (Signed)
Patient reports that she is compliant with her CPAP

## 2018-08-10 NOTE — Progress Notes (Signed)
  Subjective:    Patient ID: Elizabeth Diaz, female    DOB: 1966-04-21, 52 y.o.   MRN: [id number]   CC: f/u OSA, pain, PAD, placard form  HPI:  PAD: Patient recently had stent placed at [facility] and had f/u ABI's. Reviewed in office and she has f/u with [facility] soon.   OSA: Patient reports compliance with CPAP machine at night.  Chronic knee pain: Followed by provider at [facility]  Patient requesting temporary handicap placard due to her arthritic pain in her knee that limits her walking long distances.   Smoking status reviewed  ROS: 10 point ROS is otherwise negative, except as mentioned in HPI  Patient Active Problem List   Diagnosis Date Noted  . Onychomycosis 02/14/2018  . Type 2 diabetes mellitus ([place]) 01/27/2018  . COPD (chronic obstructive pulmonary disease) ([place]) 12/29/2016  . HTN (hypertension) 12/29/2016  . Mixed hyperlipidemia 12/29/2016  . GERD (gastroesophageal reflux disease) 12/29/2016  . Gastro-esophageal reflux disease without esophagitis 08/09/2016  . Carotid bruit 08/09/2016  . PAD (peripheral artery disease) ([place]) 02/25/2016  . Obstructive sleep apnea, adult 02/07/2016  . Exertional dyspnea 02/07/2016  . Atherosclerosis with limb claudication ([place]) 01/23/2016  . Claudication in peripheral vascular disease ([place]) 01/06/2016  . Lower extremity edema 12/12/2015  . Hearing loss 11/27/2015  . Morbid obesity due to excess calories ([place]) 10/29/2015  . Excessive daytime sleepiness 10/05/2015  . History of depression 10/05/2015  . Nail discoloration 10/05/2015  . Chronic diarrhea   . Abdominal pain, epigastric   . Bilateral lower extremity edema 04/08/2015  . Urinary incontinence 04/08/2015  . Chronic knee pain 12/07/2014  . Benign neoplasm of rectum   . IBS (irritable bowel syndrome)   . Chronic back pain 05/14/2014     Objective:  BP (!) 150/80 (BP Location: Right Arm, Patient Position: Sitting, Cuff Size: Large)   Pulse 95   Temp 98.6 F (37 C) (Oral)   Wt  292 lb 3.2 oz (132.5 kg)   SpO2 98%   BMI 57.07 kg/m  Vitals and nursing note reviewed  General: NAD, pleasant Respiratory: normal effort Extremities: no edema or cyanosis. WWP. Skin: warm and dry, no rashes noted Neuro: alert and oriented, no focal deficits Psych: normal affect  Assessment & Plan:  Handicap placard form filled out.   Chronic knee pain Followed by provider at [facility] Refilled voltaren gel and flexeril  Claudication in peripheral vascular disease [facility]) Patient follows with [facility] and reports recent stent placement. She is to continue plavix and ASA until December and should then continue with ASA alone. Has f/u with [facility] vascular surgery.   Obstructive sleep apnea, adult Patient reports that she is compliant with her CPAP  HTN (hypertension) BP at last visits with [facility] and here today elevated. Patient to work on salt restriction and diet as she does not want to start medications at this time. Discussed risks vs benefit of starting medications. Patient good candidate for ACE or ARB but again does not wish to start meds today.  -Will continue to monitor and start therapy at next visit if indicated.  -Reports compliance with CPAP  Urinary incontinence Refilled myrbetriq, patient unable to pick up previously due to insurance coverage.   Health maintenance: Ordered screening mammogram  [place] Ayushi Pla, DO Family Medicine Resident PGY-2

## 2018-08-10 NOTE — Assessment & Plan Note (Addendum)
BP at last visits with [facility] and here today elevated. Patient to work on salt restriction and diet as she does not want to start medications at this time. Discussed risks vs benefit of starting medications. Patient good candidate for ACE or ARB but again does not wish to start meds today.  -Will continue to monitor and start therapy at next visit if indicated.  -Reports compliance with CPAP

## 2018-08-25 ENCOUNTER — Emergency Department (HOSPITAL_COMMUNITY): Payer: Medicare Other

## 2018-08-25 ENCOUNTER — Other Ambulatory Visit: Payer: Self-pay

## 2018-08-25 ENCOUNTER — Encounter (HOSPITAL_COMMUNITY): Payer: Self-pay | Admitting: Emergency Medicine

## 2018-08-25 ENCOUNTER — Emergency Department (HOSPITAL_COMMUNITY)
Admission: EM | Admit: 2018-08-25 | Discharge: 2018-08-28 | Disposition: A | Discharge status: Other Acute Inpatient Hospital | Payer: Medicare Other | Attending: Emergency Medicine | Admitting: Emergency Medicine

## 2018-08-25 ENCOUNTER — Emergency Department (HOSPITAL_COMMUNITY)
Admission: EM | Admit: 2018-08-25 | Discharge: 2018-08-25 | Disposition: A | Discharge status: Home / Self Care | Payer: Medicare Other | Attending: Emergency Medicine | Admitting: Emergency Medicine

## 2018-08-25 DIAGNOSIS — M545 Low back pain, unspecified: Secondary | ICD-10-CM

## 2018-08-25 DIAGNOSIS — Z7984 Long term (current) use of oral hypoglycemic drugs: Secondary | ICD-10-CM | POA: Insufficient documentation

## 2018-08-25 DIAGNOSIS — I509 Heart failure, unspecified: Secondary | ICD-10-CM | POA: Diagnosis not present

## 2018-08-25 DIAGNOSIS — I110 Hypertensive heart disease with heart failure: Secondary | ICD-10-CM | POA: Insufficient documentation

## 2018-08-25 DIAGNOSIS — F314 Bipolar disorder, current episode depressed, severe, without psychotic features: Secondary | ICD-10-CM | POA: Insufficient documentation

## 2018-08-25 DIAGNOSIS — Z87891 Personal history of nicotine dependence: Secondary | ICD-10-CM | POA: Diagnosis not present

## 2018-08-25 DIAGNOSIS — Z79899 Other long term (current) drug therapy: Secondary | ICD-10-CM | POA: Insufficient documentation

## 2018-08-25 DIAGNOSIS — Z7902 Long term (current) use of antithrombotics/antiplatelets: Secondary | ICD-10-CM | POA: Diagnosis not present

## 2018-08-25 DIAGNOSIS — J449 Chronic obstructive pulmonary disease, unspecified: Secondary | ICD-10-CM | POA: Diagnosis not present

## 2018-08-25 DIAGNOSIS — F315 Bipolar disorder, current episode depressed, severe, with psychotic features: Secondary | ICD-10-CM | POA: Diagnosis not present

## 2018-08-25 DIAGNOSIS — Y9389 Activity, other specified: Secondary | ICD-10-CM | POA: Diagnosis not present

## 2018-08-25 DIAGNOSIS — Z7982 Long term (current) use of aspirin: Secondary | ICD-10-CM | POA: Insufficient documentation

## 2018-08-25 DIAGNOSIS — E119 Type 2 diabetes mellitus without complications: Secondary | ICD-10-CM | POA: Diagnosis not present

## 2018-08-25 DIAGNOSIS — F338 Other recurrent depressive disorders: Secondary | ICD-10-CM | POA: Diagnosis not present

## 2018-08-25 DIAGNOSIS — F29 Unspecified psychosis not due to a substance or known physiological condition: Secondary | ICD-10-CM | POA: Diagnosis not present

## 2018-08-25 DIAGNOSIS — F1210 Cannabis abuse, uncomplicated: Secondary | ICD-10-CM | POA: Diagnosis not present

## 2018-08-25 DIAGNOSIS — F3130 Bipolar disorder, current episode depressed, mild or moderate severity, unspecified: Secondary | ICD-10-CM

## 2018-08-25 DIAGNOSIS — R4182 Altered mental status, unspecified: Secondary | ICD-10-CM

## 2018-08-25 DIAGNOSIS — F1190 Opioid use, unspecified, uncomplicated: Secondary | ICD-10-CM | POA: Diagnosis not present

## 2018-08-25 DIAGNOSIS — Y92410 Unspecified street and highway as the place of occurrence of the external cause: Secondary | ICD-10-CM | POA: Diagnosis not present

## 2018-08-25 DIAGNOSIS — Y999 Unspecified external cause status: Secondary | ICD-10-CM | POA: Insufficient documentation

## 2018-08-25 DIAGNOSIS — S3992XA Unspecified injury of lower back, initial encounter: Secondary | ICD-10-CM | POA: Diagnosis present

## 2018-08-25 LAB — CBC WITH DIFFERENTIAL/PLATELET
Abs Immature Granulocytes: 0.07 10*3/uL (ref 0.00–0.07)
Basophils Absolute: 0.1 10*3/uL (ref 0.0–0.1)
Basophils Relative: 0 %
EOS PCT: 1 %
Eosinophils Absolute: 0.1 10*3/uL (ref 0.0–0.5)
HEMATOCRIT: 45 % (ref 36.0–46.0)
Hemoglobin: 14 g/dL (ref 12.0–15.0)
Immature Granulocytes: 1 %
Lymphocytes Relative: 24 %
Lymphs Abs: 3.3 10*3/uL (ref 0.7–4.0)
MCH: 28.7 pg (ref 26.0–34.0)
MCHC: 31.1 g/dL (ref 30.0–36.0)
MCV: 92.4 fL (ref 80.0–100.0)
Monocytes Absolute: 1.1 10*3/uL — ABNORMAL HIGH (ref 0.1–1.0)
Monocytes Relative: 8 %
Neutro Abs: 9.1 10*3/uL — ABNORMAL HIGH (ref 1.7–7.7)
Neutrophils Relative %: 66 %
Platelets: 322 10*3/uL (ref 150–400)
RBC: 4.87 MIL/uL (ref 3.87–5.11)
RDW: 13.2 % (ref 11.5–15.5)
WBC: 13.7 10*3/uL — ABNORMAL HIGH (ref 4.0–10.5)
nRBC: 0 % (ref 0.0–0.2)

## 2018-08-25 LAB — COMPREHENSIVE METABOLIC PANEL
ALT: 33 U/L (ref 0–44)
ANION GAP: 13 (ref 5–15)
AST: 29 U/L (ref 15–41)
Albumin: 3.4 g/dL — ABNORMAL LOW (ref 3.5–5.0)
Alkaline Phosphatase: 66 U/L (ref 38–126)
BUN: 7 mg/dL (ref 6–20)
CO2: 23 mmol/L (ref 22–32)
Calcium: 8.9 mg/dL (ref 8.9–10.3)
Chloride: 100 mmol/L (ref 98–111)
Creatinine, Ser: 0.89 mg/dL (ref 0.44–1.00)
GFR calc Af Amer: >60 mL/min (ref >60)
GFR calc non Af Amer: >60 mL/min (ref >60)
Glucose, Bld: 100 mg/dL — ABNORMAL HIGH (ref 70–99)
POTASSIUM: 3.2 mmol/L — AB (ref 3.5–5.1)
Sodium: 136 mmol/L (ref 135–145)
Total Bilirubin: 1.1 mg/dL (ref 0.3–1.2)
Total Protein: 6.9 g/dL (ref 6.5–8.1)

## 2018-08-25 LAB — URINALYSIS, ROUTINE W REFLEX MICROSCOPIC
Bilirubin Urine: NEGATIVE
Glucose, UA: NEGATIVE mg/dL
Hgb urine dipstick: NEGATIVE
Ketones, ur: 80 mg/dL — AB
Leukocytes, UA: NEGATIVE
Nitrite: NEGATIVE
PROTEIN: NEGATIVE mg/dL
Specific Gravity, Urine: 1.018 (ref 1.005–1.030)
pH: 5 (ref 5.0–8.0)

## 2018-08-25 LAB — I-STAT CHEM 8, ED
BUN: 10 mg/dL (ref 6–20)
CREATININE: 0.8 mg/dL (ref 0.44–1.00)
Calcium, Ion: 1.08 mmol/L — ABNORMAL LOW (ref 1.15–1.40)
Chloride: 100 mmol/L (ref 98–111)
Glucose, Bld: 130 mg/dL — ABNORMAL HIGH (ref 70–99)
HCT: 45 % (ref 36.0–46.0)
HEMOGLOBIN: 15.3 g/dL — AB (ref 12.0–15.0)
Potassium: 3.4 mmol/L — ABNORMAL LOW (ref 3.5–5.1)
Sodium: 137 mmol/L (ref 135–145)
TCO2: 26 mmol/L (ref 22–32)

## 2018-08-25 LAB — RAPID URINE DRUG SCREEN, HOSP PERFORMED
Amphetamines: POSITIVE — AB
Barbiturates: NOT DETECTED
Benzodiazepines: NOT DETECTED
Cocaine: NOT DETECTED
Opiates: POSITIVE — AB
Tetrahydrocannabinol: POSITIVE — AB

## 2018-08-25 LAB — ETHANOL

## 2018-08-25 LAB — CBG MONITORING, ED: Glucose-Capillary: 89 mg/dL (ref 70–99)

## 2018-08-25 MED ORDER — LIDOCAINE HCL URETHRAL/MUCOSAL 2 % EX GEL: 1.0000 "application " | Freq: Once | CUTANEOUS | Status: DC

## 2018-08-25 MED ORDER — IBUPROFEN 600 MG PO TABS: 600.0000 mg | ORAL_TABLET | Freq: Four times a day (QID) | ORAL | 0 refills | Status: DC | PRN

## 2018-08-25 MED ORDER — IOHEXOL 300 MG/ML  SOLN
125.0000 mL | Freq: Once | INTRAMUSCULAR | Status: AC | PRN
  Administered 2018-08-25: 100 mL via INTRAVENOUS

## 2018-08-25 MED ORDER — IBUPROFEN 800 MG PO TABS
800.0000 mg | ORAL_TABLET | Freq: Once | ORAL | Status: AC
  Administered 2018-08-25: 800 mg via ORAL
  Filled 2018-08-25: qty 1

## 2018-08-25 MED ORDER — SODIUM CHLORIDE 0.9 % IV BOLUS
1000.0000 mL | Freq: Once | INTRAVENOUS | Status: AC
  Administered 2018-08-25: 1000 mL via INTRAVENOUS

## 2018-08-25 MED ORDER — METHOCARBAMOL 500 MG PO TABS: 500.0000 mg | ORAL_TABLET | Freq: Four times a day (QID) | ORAL | 0 refills | Status: DC | PRN

## 2018-08-25 MED ORDER — ZIPRASIDONE MESYLATE 20 MG IM SOLR
10.0000 mg | Freq: Once | INTRAMUSCULAR | Status: AC
  Administered 2018-08-25: 10 mg via INTRAMUSCULAR
  Filled 2018-08-25: qty 20

## 2018-08-25 MED ORDER — LORAZEPAM 2 MG/ML IJ SOLN
1.0000 mg | Freq: Once | INTRAMUSCULAR | Status: AC
  Administered 2018-08-25: 1 mg via INTRAVENOUS
  Filled 2018-08-25: qty 1

## 2018-08-25 MED ORDER — HYDROCODONE-ACETAMINOPHEN 5-325 MG PO TABS
1.0000 | ORAL_TABLET | Freq: Once | ORAL | Status: AC
  Administered 2018-08-25: 1 via ORAL
  Filled 2018-08-25: qty 1

## 2018-08-25 NOTE — ED Notes (Signed)
Pt observed talking to internal stimuli about how she did not buy drugs today
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
## 2018-08-25 NOTE — Progress Notes (Signed)
Patient began to get agitated and yelling out in hallway in stretcher. Patient's stretcher placed back in room while MD came to bedside. Given orders to administer Geodon and Ativan. Patient willing accepted medication

## 2018-08-25 NOTE — ED Notes (Signed)
Patient verbalizes understanding of discharge instructions. Opportunity for questioning and answers were provided. Armband removed by staff, pt discharged from ED. Ambulated out of department

## 2018-08-25 NOTE — ED Notes (Signed)
Patient transported to X-ray 
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
## 2018-08-25 NOTE — ED Notes (Signed)
Pt ambulated to and from hallway bathroom without difficulty or need for assistance. MD notified.
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
## 2018-08-25 NOTE — ED Provider Notes (Signed)
Patient agitated and yelling.  She admits to hearing voices.  Denies feeling suicidal homicidal.  Drug screen is positive for opiates, THC and amphetamines.  CT head is negative.  Patient chemically sedated for safety of patient and staff.  Patient remains somnolent after receiving chemical sedation. She will need TTS consult when she is more alert.   EKG Interpretation  Date/Time:  Thursday August 25 2018 23:51:16 EST Ventricular Rate:  100 PR Interval:    QRS Duration: 74 QT Interval:  456 QTC Calculation: 589 R Axis:   48 Text Interpretation:  Sinus tachycardia Right atrial enlargement Consider RVH w/ secondary repol abnormality Prolonged QT interval No significant change was found Confirmed by Ezequiel Essex [phone]) on 08/26/2018 12:12:54 AM         Ezequiel Essex, MD 08/26/18 [phone]
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
## 2018-08-26 DIAGNOSIS — F314 Bipolar disorder, current episode depressed, severe, without psychotic features: Secondary | ICD-10-CM | POA: Diagnosis not present

## 2018-08-26 LAB — CBG MONITORING, ED
Glucose-Capillary: 87 mg/dL (ref 70–99)
Glucose-Capillary: 124 mg/dL — ABNORMAL HIGH (ref 70–99)

## 2018-08-26 LAB — I-STAT BETA HCG BLOOD, ED (MC, WL, AP ONLY): I-stat hCG, quantitative: <5 m[IU]/mL (ref <5)

## 2018-08-26 MED ORDER — FERROUS SULFATE 325 (65 FE) MG PO TABS
650.0000 mg | ORAL_TABLET | ORAL | Status: DC
  Administered 2018-08-26 – 2018-08-27 (×2): 650 mg via ORAL
  Filled 2018-08-26 (×2): qty 2

## 2018-08-26 MED ORDER — FESOTERODINE FUMARATE ER 4 MG PO TB24: 4.0000 mg | ORAL_TABLET | Freq: Every day | ORAL | Status: DC

## 2018-08-26 MED ORDER — GABAPENTIN 300 MG PO CAPS
600.0000 mg | ORAL_CAPSULE | Freq: Three times a day (TID) | ORAL | Status: DC
  Administered 2018-08-26 – 2018-08-27 (×2): 600 mg via ORAL
  Administered 2018-08-27: 900 mg via ORAL
  Administered 2018-08-27: 600 mg via ORAL
  Filled 2018-08-26 (×3): qty 2
  Filled 2018-08-26: qty 3
  Filled 2018-08-26: qty 2

## 2018-08-26 MED ORDER — CLOPIDOGREL BISULFATE 75 MG PO TABS
75.0000 mg | ORAL_TABLET | Freq: Every day | ORAL | Status: DC
  Administered 2018-08-26 – 2018-08-27 (×2): 75 mg via ORAL
  Filled 2018-08-26 (×2): qty 1

## 2018-08-26 MED ORDER — ALBUTEROL SULFATE (2.5 MG/3ML) 0.083% IN NEBU: 2.5000 mg | INHALATION_SOLUTION | Freq: Four times a day (QID) | RESPIRATORY_TRACT | Status: DC | PRN

## 2018-08-26 MED ORDER — ATORVASTATIN CALCIUM 10 MG PO TABS
20.0000 mg | ORAL_TABLET | Freq: Every day | ORAL | Status: DC
  Administered 2018-08-26 – 2018-08-27 (×2): 20 mg via ORAL
  Filled 2018-08-26 (×2): qty 2

## 2018-08-26 MED ORDER — FUROSEMIDE 20 MG PO TABS: 40.0000 mg | ORAL_TABLET | Freq: Every day | ORAL | Status: DC | PRN

## 2018-08-26 MED ORDER — MIRABEGRON ER 25 MG PO TB24
25.0000 mg | ORAL_TABLET | Freq: Every day | ORAL | Status: DC
  Administered 2018-08-26 – 2018-08-27 (×2): 25 mg via ORAL
  Filled 2018-08-26 (×2): qty 1

## 2018-08-26 MED ORDER — MOMETASONE FURO-FORMOTEROL FUM 100-5 MCG/ACT IN AERO
2.0000 | INHALATION_SPRAY | Freq: Two times a day (BID) | RESPIRATORY_TRACT | Status: DC
  Administered 2018-08-27 (×2): 2 via RESPIRATORY_TRACT
  Filled 2018-08-26: qty 8.8

## 2018-08-26 MED ORDER — METFORMIN HCL ER 500 MG PO TB24
1000.0000 mg | ORAL_TABLET | Freq: Every day | ORAL | Status: DC
  Administered 2018-08-27: 1000 mg via ORAL
  Filled 2018-08-26: qty 2

## 2018-08-26 MED ORDER — ASPIRIN 81 MG PO CHEW
81.0000 mg | CHEWABLE_TABLET | Freq: Every day | ORAL | Status: DC
  Administered 2018-08-26 – 2018-08-27 (×2): 81 mg via ORAL
  Filled 2018-08-26 (×2): qty 1

## 2018-08-26 MED ORDER — CYCLOBENZAPRINE HCL 10 MG PO TABS: 10.0000 mg | ORAL_TABLET | Freq: Three times a day (TID) | ORAL | Status: DC | PRN

## 2018-08-26 MED ORDER — EZETIMIBE 10 MG PO TABS
10.0000 mg | ORAL_TABLET | Freq: Every day | ORAL | Status: DC
  Administered 2018-08-26 – 2018-08-27 (×2): 10 mg via ORAL
  Filled 2018-08-26 (×2): qty 1

## 2018-08-26 MED ORDER — VITAMIN D 25 MCG (1000 UNIT) PO TABS
1000.0000 [IU] | ORAL_TABLET | Freq: Every day | ORAL | Status: DC
  Administered 2018-08-26 – 2018-08-27 (×2): 1000 [IU] via ORAL
  Filled 2018-08-26 (×2): qty 1
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
## 2018-08-26 NOTE — ED Notes (Signed)
Patient given water

## 2018-08-26 NOTE — ED Notes (Signed)
Nelva Nay (Daughter) called, [phone], would like update on patients status. She is out of town and unable to come to this facility.

## 2018-08-26 NOTE — ED Provider Notes (Signed)
Patient seen and evaluated by TTS.  They feel as though she requires overnight observation for formal psychiatric consultation in the morning.   Veryl Speak, MD 08/26/18 [phone]
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
## 2018-08-26 NOTE — ED Notes (Signed)
Pt given snack of graham crackers.  Okay per RN

## 2018-08-26 NOTE — ED Notes (Signed)
CBG 124.  RN notified.

## 2018-08-26 NOTE — ED Notes (Signed)
Patient spoke with daughter-Monique,RN

## 2018-08-26 NOTE — ED Notes (Signed)
Pt belongings placed in locker 6.

## 2018-08-26 NOTE — ED Notes (Signed)
PT CBG 87

## 2018-08-26 NOTE — ED Notes (Signed)
Elizabeth Diaz [phone]; please call to give update on patient status. This family member will  provide transportation home from this facility
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
## 2018-08-26 NOTE — ED Notes (Signed)
Purewick applied.

## 2018-08-26 NOTE — ED Notes (Signed)
Edrick Kins called for an update @ [phone]

## 2018-08-27 DIAGNOSIS — F338 Other recurrent depressive disorders: Secondary | ICD-10-CM

## 2018-08-27 DIAGNOSIS — F314 Bipolar disorder, current episode depressed, severe, without psychotic features: Secondary | ICD-10-CM | POA: Diagnosis not present

## 2018-08-27 DIAGNOSIS — F29 Unspecified psychosis not due to a substance or known physiological condition: Secondary | ICD-10-CM

## 2018-08-27 DIAGNOSIS — F3130 Bipolar disorder, current episode depressed, mild or moderate severity, unspecified: Secondary | ICD-10-CM

## 2018-08-27 DIAGNOSIS — F315 Bipolar disorder, current episode depressed, severe, with psychotic features: Secondary | ICD-10-CM

## 2018-08-27 LAB — CBG MONITORING, ED
Glucose-Capillary: 118 mg/dL — ABNORMAL HIGH (ref 70–99)
Glucose-Capillary: 116 mg/dL — ABNORMAL HIGH (ref 70–99)
Glucose-Capillary: 145 mg/dL — ABNORMAL HIGH (ref 70–99)
Glucose-Capillary: 119 mg/dL — ABNORMAL HIGH (ref 70–99)

## 2018-08-27 MED ORDER — INSULIN ASPART 100 UNIT/ML ~~LOC~~ SOLN: 0.0000 [IU] | Freq: Three times a day (TID) | SUBCUTANEOUS | Status: DC

## 2018-08-27 NOTE — ED Notes (Signed)
ED Provider at bedside. 

## 2018-08-27 NOTE — BHH Counselor (Signed)
Patient has been accepted to [facility] .  Accepting is Dr. Liliane Shi to [address] bed 1. Report can be called to [phone] Charge Nurse.  Patient can be transported as soon as possible.  Nurse Eber Hong notified of the acceptance.

## 2018-08-27 NOTE — ED Notes (Signed)
Pt attempted to make phone calls x 2 - no answer.

## 2018-08-27 NOTE — Consult Note (Addendum)
                         Tele-Psych Consultation Note  Patient is seen and chart is reviewed. The patient is very tearful during the assessment this morning. Angla states "I have been very depressed for the last few months. I see shadows and hear people talking. I got so depressed that I started using drugs again. I tried meth and used some crack. I'm just not doing well." Discussed case with Dr. Parke Poisson. Due to severity of symptoms the patient meets criteria for inpatient psychiatric admission. Patient continues to endorse symptoms of severe depression and psychosis.  Elmarie Shiley PMHNP-C 08/27/2018 10:06 am   Case reviewed with NP

## 2018-08-27 NOTE — ED Provider Notes (Signed)
Patient accepted to [facility]. Accepting is Dr. Liliane Shi. Patient feels well, appears stable for transport   Sherwood Gambler, MD 08/27/18 2340

## 2018-08-27 NOTE — ED Notes (Signed)
Lunch tray ordered 

## 2018-08-27 NOTE — ED Notes (Signed)
Patient spoke with life partner Stacy Gardner in regards to transfer to [place]; Patient asked to take shower prior to leaving and was given changing scrubs and materials to [facility]

## 2018-08-27 NOTE — Progress Notes (Signed)
Patient meets criteria for inpatient treatment. No appropriate or available beds at [facility]. CSW faxed referrals to the following facilities for review:  [place], [place], [place], [place], [place], [place], [place], [place], [place], [place], [place], [place], [place], [place], [place], [place], and [facility].  TTS will continue to seek bed placement.  Chalmers Guest. Guerry Bruin, MSW, [place] Work/Disposition Phone: [phone] Fax: [phone]

## 2018-08-27 NOTE — ED Notes (Signed)
Dinner tray ordered.

## 2018-08-27 NOTE — ED Notes (Signed)
Pt signed consent to release info form for info to be given to Oretha Caprice, cousin - faxed to [facility], copy sent to Medical Records, and copy on clipboard.

## 2018-08-27 NOTE — ED Notes (Signed)
Pt on phone talking w/her sister - Elizabeth Diaz - pt gave verbal permission to give her information.

## 2018-10-03 ENCOUNTER — Ambulatory Visit
Admission: RE | Admit: 2018-10-03 | Discharge: 2018-10-03 | Disposition: A | Discharge status: Home / Self Care | Payer: Medicare HMO | Source: Ambulatory Visit | Attending: Family Medicine | Admitting: Family Medicine

## 2018-10-03 DIAGNOSIS — Z1239 Encounter for other screening for malignant neoplasm of breast: Secondary | ICD-10-CM

## 2018-10-05 ENCOUNTER — Ambulatory Visit: Payer: Medicare HMO | Admitting: Family Medicine

## 2018-10-06 ENCOUNTER — Ambulatory Visit: Payer: Medicare HMO | Admitting: Family Medicine

## 2018-10-19 ENCOUNTER — Ambulatory Visit: Payer: Medicare HMO | Admitting: Family Medicine

## 2018-11-08 ENCOUNTER — Other Ambulatory Visit: Payer: Self-pay

## 2018-11-08 ENCOUNTER — Encounter: Payer: Self-pay | Admitting: Family Medicine

## 2018-11-08 ENCOUNTER — Ambulatory Visit (INDEPENDENT_AMBULATORY_CARE_PROVIDER_SITE_OTHER): Payer: Medicare HMO | Admitting: Family Medicine

## 2018-11-08 VITALS — BP 130/76 | Temp 98.6°F | Wt 285.0 lb

## 2018-11-08 DIAGNOSIS — R600 Localized edema: Secondary | ICD-10-CM | POA: Diagnosis not present

## 2018-11-08 DIAGNOSIS — E119 Type 2 diabetes mellitus without complications: Secondary | ICD-10-CM | POA: Diagnosis not present

## 2018-11-08 DIAGNOSIS — M549 Dorsalgia, unspecified: Secondary | ICD-10-CM | POA: Diagnosis not present

## 2018-11-08 DIAGNOSIS — G5712 Meralgia paresthetica, left lower limb: Secondary | ICD-10-CM | POA: Diagnosis not present

## 2018-11-08 DIAGNOSIS — G4719 Other hypersomnia: Secondary | ICD-10-CM

## 2018-11-08 DIAGNOSIS — G8929 Other chronic pain: Secondary | ICD-10-CM

## 2018-11-08 DIAGNOSIS — I739 Peripheral vascular disease, unspecified: Secondary | ICD-10-CM

## 2018-11-08 LAB — POCT GLYCOSYLATED HEMOGLOBIN (HGB A1C): HbA1c, POC (controlled diabetic range): 6.7 % (ref 0.0–7.0)

## 2018-11-08 MED ORDER — EZETIMIBE 10 MG PO TABS: 10.0000 mg | ORAL_TABLET | Freq: Every day | ORAL | 11 refills | Status: DC

## 2018-11-08 MED ORDER — ATORVASTATIN CALCIUM 80 MG PO TABS: 80.0000 mg | ORAL_TABLET | Freq: Every day | ORAL | 3 refills | Status: DC

## 2018-11-08 MED ORDER — ALBUTEROL SULFATE 1.25 MG/3ML IN NEBU: 1.0000 | INHALATION_SOLUTION | Freq: Four times a day (QID) | RESPIRATORY_TRACT | 1 refills | Status: DC | PRN

## 2018-11-08 MED ORDER — FUROSEMIDE 40 MG PO TABS: 40.0000 mg | ORAL_TABLET | Freq: Every day | ORAL | 3 refills | Status: DC | PRN

## 2018-11-08 MED ORDER — PREGABALIN 25 MG PO CAPS: 25.0000 mg | ORAL_CAPSULE | Freq: Two times a day (BID) | ORAL | 2 refills | Status: DC

## 2018-11-08 MED ORDER — TOLTERODINE TARTRATE ER 4 MG PO CP24: 4.0000 mg | ORAL_CAPSULE | Freq: Every day | ORAL | 1 refills | Status: DC

## 2018-11-08 MED ORDER — METFORMIN HCL ER 500 MG PO TB24: 1000.0000 mg | ORAL_TABLET | Freq: Two times a day (BID) | ORAL | 2 refills | Status: DC

## 2018-11-08 MED ORDER — BACLOFEN 10 MG PO TABS: 10.0000 mg | ORAL_TABLET | Freq: Three times a day (TID) | ORAL | 1 refills | Status: DC

## 2018-11-08 MED ORDER — BUDESONIDE-FORMOTEROL FUMARATE 80-4.5 MCG/ACT IN AERO: 2.0000 | INHALATION_SPRAY | Freq: Two times a day (BID) | RESPIRATORY_TRACT | 3 refills | Status: DC

## 2018-11-08 NOTE — Progress Notes (Signed)
Subjective:    Patient ID: ARIONNE IAMS, female    DOB: Apr 02, 1966, 53 y.o.   MRN: [id number]  MISHAAL LANSDALE is a80 yo female with PMH HFpEF, PAD (01/2016 Atherectomy and DCB PTA of prox R. SFA, 10/2016 R. SFA PTA/stenting, 05/2018 PTA/stenting R. SFA), HTN, dyslipidemia, T2DM, GERD and morbid obesity.   CC: Follow-up diabetes and medication management  HPI:  Diabetes:  Last A1c 6.9 on 06/07/2018 Taking medications: metformin 1000 mg twice daily On Aspirin, and on statin, was to start zetia, but has not Last foot exam: up to date ROS: denies dizziness, diaphoresis, LOC, polyuria, polydipsia  Medication management Reviewed patient's medications and provided refills.  Patient states that she is not taking Lexapro, ferrous sulfate, gabapentin, Myrbetriq, has finished Lamisil.  Patient is seeing a psychiatrist who will manage her Atarax, Topamax. States that she is unsure why she has been on gabapentin as she is not ever noticed any improvement from this medication and would like to change to something else if possible.  PAD s/p stenting R SFA Patient also has not been taking her Plavix as she ran out of this medication.  On chart review it appears that cardiology had wanted patient to stop Plavix by the end of December and only continue aspirin.  Patient had her third stent placed on July 28, 2018 and has follow-up with cardiology on February 27.  Patient does state that she has been compliant with her aspirin. ROS: Denies any lower extremity pain, chest pain, SOB  Meralgia paresthetica  Patient states that she has had numbness and tingling in her left thigh over the past few years and that is when she was started on gabapentin.  Patient reports that she does not wear any tight clothing or belts.  She is however attempting to have bariatric surgery done in order for better weight loss.  Patient has lost a few pounds over the past couple of months and does not notice any improvement in  her symptoms of this numbness and tingling.  Patient denies any radiation to other locations other than her thigh.   ROS: Denies any bowel or bladder incontinence, denies any weakness  Insomnia Patient states that she has trouble falling asleep and staying asleep.  She is taking care of her 2 grandchildren who often fall asleep watching the TV.  States that she is trying to get them on a better sleep schedule for all of them.  She has recently had a change in her psychiatry medications and has not followed up with them and will discuss at this time.  Patient has not tried any medications in order to help her sleep.  Patient states that she does fall asleep watching her phone in the bed.  Patient does not have a regular routine given that she is often trying to take care of her grandchildren. Patient reports compliance with her CPAP  Smoking status reviewed  ROS: 10 point ROS is otherwise negative, except as mentioned in HPI  Patient Active Problem List   Diagnosis Date Noted  . Meralgia paraesthetica 11/10/2018  . Bipolar affective disorder, depressed ([place]) 08/27/2018  . Type 2 diabetes mellitus, controlled ([place]) 01/27/2018  . COPD (chronic obstructive pulmonary disease) ([place]) 12/29/2016  . Mixed hyperlipidemia 12/29/2016  . GERD (gastroesophageal reflux disease) 12/29/2016  . Gastro-esophageal reflux disease without esophagitis 08/09/2016  . Carotid bruit 08/09/2016  . PAD (peripheral artery disease) ([place]) 02/25/2016  . Obstructive sleep apnea, adult 02/07/2016  . Exertional dyspnea 02/07/2016  .  HTN, goal below 140/90 01/23/2016  . Atherosclerosis with limb claudication ([place]) 01/23/2016  . Claudication in peripheral vascular disease ([place]) 01/06/2016  . Lower extremity edema 12/12/2015  . Hearing loss 11/27/2015  . Morbid obesity due to excess calories ([place]) 10/29/2015  . Excessive daytime sleepiness 10/05/2015  . History of depression 10/05/2015  . Chronic diarrhea   . Abdominal  pain, epigastric   . Bilateral lower extremity edema 04/08/2015  . Urinary incontinence 04/08/2015  . Bilateral chronic knee pain 12/07/2014  . Benign neoplasm of rectum   . IBS (irritable bowel syndrome)   . Chronic back pain 05/14/2014     Objective:  BP 130/76   Temp 98.6 F (37 C) (Oral)   Wt 285 lb (129.3 kg)   BMI 55.66 kg/m  Vitals and nursing note reviewed  General: NAD, pleasant, obese Eyes: PERRL, EOMI, no conjunctival pallor or injection ENTM: Moist mucous membranes Neck: Supple, no LAD Cardiovascular: RRR, no m/r/g, no LE edema Respiratory: CTA BL, normal work of breathing Gastrointestinal: soft, nontender, nondistended, normoactive BS MSK: moves 4 extremities equally Derm: no rashes appreciated, WWP Neuro: alert and oriented, no focal deficits Psych: normal affect, normal thought content, normal judgment  Assessment & Plan:    Meralgia paraesthetica Exam and history c/w meralgia paraesthetica of left. Patient encouraged to continue with weight loss and avoid tight clothing and belts.   Could possibly be related to back pain however given history and radiation of symptoms less likely.  Type 2 diabetes mellitus, controlled (HCC) A1c today 6.7. Well controlled on metformin. Continue current medications. Patient to have labs performed by bariatric surgeon and is going this week.   PAD (peripheral artery disease) [facility]) Patient has follow-up with cardiology on February 27 and has been out of her Plavix for the past few weeks.  Given last vascular note in care everywhere will continue with aspirin alone and DC Plavix at this time given that patient has not had it for the past few weeks.  Discussed this with pharmacy who agrees.  Excessive daytime sleepiness Patient reports that she is compliant with her CPAP.  She states that she is still having trouble falling asleep however she does not have very good at bedtime habits.  Counseled on proper sleep hygiene.  Patient  also told that if she is still having trouble falling asleep after making changes in her sleep hygiene then she may try melatonin over-the-counter.  Patient is also to discuss this with her psychiatrist.  Medication management We will trial patient on pregabalin for her chronic pain as well as meralgia paresthetica.  Will discontinue gabapentin as she has been on this for years and states that she never noticed any improvement with this.  We will also stop the Robaxin and change to baclofen for decreased anticholinergic as patient does not notice very much improvement with Robaxin or Flexeril. Refilled patient's medications per her request and updated her medication list.  Martinique Donaldo Teegarden, DO Family Medicine Resident PGY-2

## 2018-11-08 NOTE — Patient Instructions (Addendum)
Thank you for coming to see me today. It was a pleasure! Today we talked about:   Your diabetes is well controlled and congratulations on the weight loss!  I have refilled all of your medications.  There is no need to continue taking Plavix but please continue to take your aspirin every day.  It is also important that you take your Lipitor and Zetia for cholesterol management.  We had changed your gabapentin to pregabalin to see if this helps better with your neuropathic pain.  Please take 25 mg twice daily for the first week and if this is not helping then you may increase to 2 tablets twice per day.    Do not take naproxen with ibuprofen at the same time.  I have started baclofen rather than Robaxin and Flexeril for your muscle relaxers in order to help with your pain.  Please do not take this prior to operating a motor vehicle.  As far as her sleep is concerned it is very important that you wear your CPAP nightly and start a routine prior to going to bed at night.  It is important to be in a dark room and avoid any sort of screens such as phones or TVs 30 minutes prior to bed.  You may also try melatonin at night if you are unsuccessful.  Please follow-up with me in 3 months or sooner as needed.  If you have any questions or concerns, please do not hesitate to call the office at [phone].  Take Care,   [place] Gemini Beaumier, DO  You can follow good "sleep hygiene." That means that you: ?Sleep only long enough to feel rested and then get out of bed  ?Go to bed and get up at the same time every day  ?Do not try to force yourself to sleep. If you can't sleep, get out of bed and try again later.  ?Have coffee, tea, and other foods that have caffeine only in the morning  ?Avoid alcohol in the late afternoon, evening, and bedtime  ?Avoid smoking, especially in the evening  ?Keep your bedroom dark, cool, quiet, and free of reminders of work or other things that cause you stress  ?Solve  problems you have before you go to bed  ?Exercise several days a week, but not right before bed  ?Avoid looking at phones or reading devices ("e-books") that give off light before bed. This can make it harder to fall asleep.   Other things that can improve sleep include: ?Relaxation therapy, in which you focus on relaxing all the muscles in your body 1 by 1  ?Working with a Engineer, materials to deal with the problems that might be causing poor sleep

## 2018-11-10 DIAGNOSIS — G5710 Meralgia paresthetica, unspecified lower limb: Secondary | ICD-10-CM | POA: Insufficient documentation

## 2018-11-10 MED ORDER — GLUCOSE BLOOD VI STRP: ORAL_STRIP | 12 refills | Status: DC

## 2018-11-10 NOTE — Assessment & Plan Note (Signed)
A1c today 6.7. Well controlled on metformin. Continue current medications. Patient to have labs performed by bariatric surgeon and is going this week.

## 2018-11-10 NOTE — Assessment & Plan Note (Addendum)
Exam and history c/w meralgia paraesthetica of left. Patient encouraged to continue with weight loss and avoid tight clothing and belts.   Could possibly be related to back pain however given history and radiation of symptoms less likely.

## 2018-11-11 ENCOUNTER — Ambulatory Visit: Payer: Medicare HMO | Admitting: Pharmacist

## 2018-11-15 NOTE — Assessment & Plan Note (Signed)
Patient has follow-up with cardiology on February 27 and has been out of her Plavix for the past few weeks.  Given last vascular note in care everywhere will continue with aspirin alone and DC Plavix at this time given that patient has not had it for the past few weeks.  Discussed this with pharmacy who agrees.

## 2018-11-15 NOTE — Assessment & Plan Note (Signed)
Patient reports that she is compliant with her CPAP.  She states that she is still having trouble falling asleep however she does not have very good at bedtime habits.  Counseled on proper sleep hygiene.  Patient also told that if she is still having trouble falling asleep after making changes in her sleep hygiene then she may try melatonin over-the-counter.  Patient is also to discuss this with her psychiatrist.

## 2018-11-25 ENCOUNTER — Telehealth: Payer: Self-pay | Admitting: *Deleted

## 2018-11-25 NOTE — Telephone Encounter (Signed)
Pharmacy calls because they do not have the strip needed for pt machine (machine is old).  They are requesting a new GENERIC script for meter, strips and lancets sent in and they get the one covered by insurance.   PLEASE have ICD code and Dx code with how many times a day she test.  Also have Faculty member as provider as they are [place] certified. Ariday Brinker, [name], CMA

## 2018-11-29 NOTE — Telephone Encounter (Signed)
Yes please do this

## 2018-11-30 MED ORDER — BLOOD GLUCOSE MONITOR KIT: PACK | 0 refills | Status: AC

## 2018-11-30 MED ORDER — ACCU-CHEK SOFTCLIX LANCETS MISC: 12 refills | Status: DC

## 2018-11-30 MED ORDER — BLOOD GLUCOSE MONITOR KIT: PACK | 0 refills | Status: DC

## 2018-11-30 MED ORDER — ACCU-CHEK AVIVA DEVI: 0 refills | Status: AC

## 2018-11-30 MED ORDER — ACCU-CHEK SOFTCLIX LANCET DEV KIT: PACK | 0 refills | Status: AC

## 2018-11-30 MED ORDER — GLUCOSE BLOOD VI STRP: ORAL_STRIP | 12 refills | Status: DC

## 2018-11-30 NOTE — Telephone Encounter (Signed)
Sent in under [place] name. Fleeger, [name], CMA

## 2018-12-02 ENCOUNTER — Encounter: Payer: Self-pay | Admitting: Family Medicine

## 2019-02-03 ENCOUNTER — Other Ambulatory Visit: Payer: Self-pay | Admitting: Family Medicine

## 2019-03-08 ENCOUNTER — Telehealth: Payer: Self-pay | Admitting: Family Medicine

## 2019-03-08 NOTE — Telephone Encounter (Signed)
Pt is calling and would like Dr. Enid Derry to write a note for [facility] of the [place] stating that she still qualifies to have mental health care and counseling. She needs this letter so these visits can still be covered by her insurance.   Pt would like to be called when the letter is ready to be picked up at the front desk.

## 2019-03-08 NOTE — Telephone Encounter (Signed)
Letter in patient's chart.

## 2019-03-08 NOTE — Telephone Encounter (Signed)
Will forward to MD to advise and complete letter. Clarise Chacko,CMA

## 2019-03-08 NOTE — Telephone Encounter (Signed)
Patient informed that letter is ready for pick up.  Jazmin Hartsell,CMA

## 2019-03-27 ENCOUNTER — Other Ambulatory Visit: Payer: Self-pay | Admitting: Family Medicine

## 2019-03-29 ENCOUNTER — Telehealth: Payer: Self-pay | Admitting: Family Medicine

## 2019-03-29 ENCOUNTER — Ambulatory Visit: Payer: Medicare HMO

## 2019-03-29 ENCOUNTER — Other Ambulatory Visit: Payer: Self-pay

## 2019-03-29 ENCOUNTER — Ambulatory Visit: Payer: Medicare HMO | Admitting: Family Medicine

## 2019-03-29 ENCOUNTER — Telehealth (INDEPENDENT_AMBULATORY_CARE_PROVIDER_SITE_OTHER): Payer: Medicare HMO | Admitting: Family Medicine

## 2019-03-29 DIAGNOSIS — M25562 Pain in left knee: Secondary | ICD-10-CM

## 2019-03-29 DIAGNOSIS — M25561 Pain in right knee: Secondary | ICD-10-CM | POA: Diagnosis not present

## 2019-03-29 DIAGNOSIS — K580 Irritable bowel syndrome with diarrhea: Secondary | ICD-10-CM

## 2019-03-29 DIAGNOSIS — G8929 Other chronic pain: Secondary | ICD-10-CM

## 2019-03-29 MED ORDER — LOPERAMIDE HCL 2 MG PO TABS: 2.0000 mg | ORAL_TABLET | Freq: Three times a day (TID) | ORAL | 0 refills | Status: DC | PRN

## 2019-03-29 NOTE — Progress Notes (Signed)
Colon Telemedicine Visit  Patient consented to have virtual visit. Method of visit: Telephone  Encounter participants: Patient: Elizabeth Diaz - located at home Provider: [place] Tayva Easterday - located at home Others (if applicable): n/a  Chief Complaint:   HPI: Surveyor, quantity. She has seen GI before, but she needed a referral. Stomach pain and history of IBS. She said it has been getting a lot worse. She tries to over the counter immodium and pepto bismol and it is not helping. She is having issues with diarrhea. She says they have tried 3-4 different medications for her diarrhea before but never had anything that was helpful. She had an EGD 3-4 months ago with Dr. Raquel James, but having issues staying with him due to insurance coverage. She reports she is up to date on her colonoscopy.   Patient still attempting weight loss and evaluation for weight loss surgery, which she is hopeful will help with IBS, as well as her knee pain.   ROS: per HPI  Pertinent PMHx: IBS, OA, morbid obesity  Exam:  Respiratory: talking in complete sentences without trouble  Assessment/Plan:  IBS (irritable bowel syndrome) Referral placed for [facility] given insurance issues. Patient also given rx for loperamide that was previously written by Dr. Hilarie Fredrickson to help alleviate her sx before she can be seen again by GI.   Bilateral chronic knee pain Patient requesting sitting walker for when she goes out for extended periods. Will place DME request.     Time spent during visit with patient: 11 minutes

## 2019-03-29 NOTE — Assessment & Plan Note (Signed)
Patient requesting sitting walker for when she goes out for extended periods. Will place DME request.

## 2019-03-29 NOTE — Telephone Encounter (Signed)
Clinical info completed on Progress Report form.  Place form in PCP's box for completion.  Salvatore Marvel, CMA

## 2019-03-29 NOTE — Assessment & Plan Note (Signed)
Referral placed for [facility] given insurance issues. Patient also given rx for loperamide that was previously written by Dr. Hilarie Fredrickson to help alleviate her sx before she can be seen again by GI.

## 2019-03-29 NOTE — Telephone Encounter (Signed)
Pt came into office to drop off forms for PCP to complete. Pt's last apt was on 11-08-2018. Placed from into blue folder.

## 2019-04-06 NOTE — Telephone Encounter (Signed)
Acknowledged, form picked up and will complete. Patient was made aware it may take a while to complete and that I am not sure if I can complete it.

## 2019-04-11 NOTE — Telephone Encounter (Signed)
Pt is calling to check on the status of picking her form up at the front. Please call patient when form is completed and ready to be picked up.

## 2019-04-19 NOTE — Telephone Encounter (Signed)
Called patient on 04/18/2019 and discussed that we are not able to fill out her disability paperwork as it requires full disability evaluation with how much weight she may use for each task, etc. She was advised to use the disability doctors at the [employer] office, or she may call the [employer] ([place]) at [phone]. Patient voiced understanding that we cannot fill out all these forms. Advised that if there is a different form, such as one requesting our last office visit, we could provide that. All questions answered.

## 2019-04-27 NOTE — Telephone Encounter (Signed)
Patient came in asking to pick up forms that 'were ready for her'.  I could not find any so I looked in chart, and I read your note to patient about how we couldn't fill her forms out.  She says we still have a form for her, then she said she will come back to drop off another form.  I told her you would need to call her to see what to do as we cannot have her waiting in lobby without an appointment.  She says this form is urgent and so could you please call her to discuss this again?, asap please.  Thank you.

## 2019-04-28 ENCOUNTER — Other Ambulatory Visit: Payer: Self-pay | Admitting: Family Medicine

## 2019-04-28 DIAGNOSIS — R600 Localized edema: Secondary | ICD-10-CM

## 2019-05-21 ENCOUNTER — Emergency Department (HOSPITAL_COMMUNITY): Admission: EM | Admit: 2019-05-21 | Discharge: 2019-05-21 | Discharge status: Absent without leave | Payer: Medicare HMO

## 2019-05-23 ENCOUNTER — Ambulatory Visit (INDEPENDENT_AMBULATORY_CARE_PROVIDER_SITE_OTHER): Payer: Medicare HMO | Admitting: Family Medicine

## 2019-05-23 ENCOUNTER — Encounter: Payer: Self-pay | Admitting: Family Medicine

## 2019-05-23 ENCOUNTER — Other Ambulatory Visit: Payer: Self-pay

## 2019-05-23 VITALS — BP 110/58 | HR 90 | Ht 60.0 in | Wt 291.1 lb

## 2019-05-23 DIAGNOSIS — E119 Type 2 diabetes mellitus without complications: Secondary | ICD-10-CM | POA: Diagnosis not present

## 2019-05-23 DIAGNOSIS — I739 Peripheral vascular disease, unspecified: Secondary | ICD-10-CM | POA: Diagnosis not present

## 2019-05-23 LAB — POCT URINALYSIS DIPSTICK
Bilirubin, UA: NEGATIVE
Blood, UA: NEGATIVE
Glucose, UA: NEGATIVE
Ketones, UA: NEGATIVE
Leukocytes, UA: NEGATIVE
Nitrite, UA: NEGATIVE
Protein, UA: NEGATIVE
Spec Grav, UA: 1.025 (ref 1.010–1.025)
Urobilinogen, UA: 0.2 E.U./dL
pH, UA: 5.5 (ref 5.0–8.0)

## 2019-05-23 LAB — POCT GLYCOSYLATED HEMOGLOBIN (HGB A1C): HbA1c, POC (controlled diabetic range): 7.1 % — AB (ref 0.0–7.0)

## 2019-05-23 LAB — MICROALBUMIN / CREATININE URINE RATIO
Albumin/Creatinine Ratio, Urine, POC: <30
Albumin: 10
Creatinine, Ur: 200 mg/dL

## 2019-05-23 MED ORDER — BACLOFEN 10 MG PO TABS: 10.0000 mg | ORAL_TABLET | Freq: Three times a day (TID) | ORAL | 0 refills | Status: DC

## 2019-05-23 NOTE — Progress Notes (Signed)
Subjective:  Patient ID: Elizabeth Diaz  DOB: 25-Apr-1966 MRN: [id number]  Elizabeth Diaz is a 53 y.o. female with a PMH of HFpEF, PAD (01/2016 Atherectomy and DCB PTA of prox R. SFA, 10/2016 R. SFA PTA/stenting, 05/2018 PTA/stenting R. SFA), HTN, dyslipidemia, T2DM, GERD and morbid obesity, here today for f/u diabetes.   HPI:   Diabetes: Disease Monitoring: - Blood Sugar ranges-120s - Medications: Metformin 1000 mg twice daily, Compliant: Patient reports that she has not been taking her medication routinely for the past 2 to 3 months  - On Aspirin, and on statin-patient states she was recently told to stop her statin and start Zetia and then was called to stop both.  However on review of notes from her recent visit for PAD patient is to start both of these medications - Last eye exam: recently, February 2020 - Last foot exam: up to date ROS: denies hypoglycemic sx, dizziness, diaphoresis, LOC, polyuria, polydipsia  Monitoring Labs and Parameters - Last A1C:  Lab Results  Component Value Date   HGBA1C 7.1 (A) 05/23/2019    PAD s/p stenting R SFA Patient reports compliance with her aspirin.  She was recently evaluated by her vascular surgeon: Dr. Ronnald Ramp reviewed her vascular studies and has had occlusion of her right SFA twice, he does not recommend re-intervention. Rather advised that she increase walking to help produce increased collaterals. Continue ASA ROS: Denies any lower extremity pain, chest pain, SOB  Morbid obesity -Patient has been following with a nutritionist regularly is attempting to get bariatric surgery done.  In order for her insurance to be able to pay this patient will need to follow-up with me monthly for the next 6 months.  Patient is to schedule follow-up accordingly. -Did discuss today that at her next appointment we will need to further discuss her diet in order to better control her diabetes  -Will also need to discuss a better exercise routine for her given  that she has a history of PAD with a recommendation from her vascular surgeon to exercise as she is no longer a surgical candidate   ROS: as mentioned in HPI  Social hx: Denies use of illicit drugs, alcohol use Smoking status reviewed  Patient Active Problem List   Diagnosis Date Noted  . Meralgia paraesthetica 11/10/2018  . Bipolar affective disorder, depressed ([place]) 08/27/2018  . Type 2 diabetes mellitus, controlled ([place]) 01/27/2018  . COPD (chronic obstructive pulmonary disease) ([place]) 12/29/2016  . Mixed hyperlipidemia 12/29/2016  . GERD (gastroesophageal reflux disease) 12/29/2016  . Gastro-esophageal reflux disease without esophagitis 08/09/2016  . Carotid bruit 08/09/2016  . PAD (peripheral artery disease) ([place]) 02/25/2016  . Obstructive sleep apnea, adult 02/07/2016  . Exertional dyspnea 02/07/2016  . HTN, goal below 140/90 01/23/2016  . Atherosclerosis with limb claudication ([place]) 01/23/2016  . Claudication in peripheral vascular disease ([place]) 01/06/2016  . Lower extremity edema 12/12/2015  . Hearing loss 11/27/2015  . Morbid obesity due to excess calories ([place]) 10/29/2015  . Excessive daytime sleepiness 10/05/2015  . History of depression 10/05/2015  . Chronic diarrhea   . Abdominal pain, epigastric   . Bilateral lower extremity edema 04/08/2015  . Urinary incontinence 04/08/2015  . Bilateral chronic knee pain 12/07/2014  . Benign neoplasm of rectum   . IBS (irritable bowel syndrome)   . Chronic back pain 05/14/2014     Objective:  BP (!) 110/58   Pulse 90   Ht 5' (1.524 m)   Wt 291 lb 2 oz (132.1  kg)   SpO2 96%   BMI 56.86 kg/m   Vitals and nursing note reviewed  General: NAD, pleasant, overweight Pulm: normal effort Extremities: no edema or cyanosis. WWP. Skin: warm and dry, no rashes noted Neuro: alert and oriented, no focal deficits Psych: normal affect, normal thought content  Assessment & Plan:   Type 2 diabetes mellitus, controlled ([place])  Patient with A1c of 7.1 today.  Encouraged to be compliant with her metformin 1000 mg twice daily.  Urine microalbumin negative. Recent BMP 03/09/2019: NA 140, K 4.2, BUN 9, Cr 0.7, albumin 3.2  -Patient to be coming monthly for evaluation for bariatric surgery.  Encouraged her to make appropriate diet changes as well as exercise regularly  PAD (peripheral artery disease) ([place]) Patient instructed to be compliant with aspirin and statin.  She was instructed to follow-up with her vascular surgeon regarding her recommendations for her statin and Zetia, given that there was some confusion.  Instructed patient that she should restart her Lipitor 80 mg daily, at least- she reports that she has plenty at home.  Morbid obesity due to excess calories [facility]) Patient is to be following me monthly for the next 6 months in order for her to qualify for bariatric surgery.  She is also following with nutritional services.  Counseled her today on proper diet choices and encouraged her to exercise routinely with a goal being at least 150 minutes/week of cardio, such as walking.  Patient also asking me to fill out a form for her continued disability.  I was only provided with page 1 of the form which did not have an area for my signature.  Previously not able to fill out the second page of the form as it was an in-depth disability paperwork detailing how much weight patient could lift and how far she could walk.  Patient states that she does not believe that she needs my part of the form for now, and she will turn it in as she has it without my signature given that it is due 9/3.  If she needs different forms completed, then she will reach out to me.  [place] Darlene Brozowski, DO Family Medicine Resident PGY-3

## 2019-05-23 NOTE — Patient Instructions (Addendum)
Thank you for coming to see me today. It was a pleasure! Today we talked about:   Your Diabetes: Please start taking your metformin daily.   For your cholesterol medications: atorvastatin take it daily.  Please take your Aspirin, Symbicort inhaler, hydroxyzine and latuda from your psychiatrist.   I'll refill your muscle relaxer baclofen, and you may continue to use mustard for your spasms.   Please follow-up with me in 3 months or sooner as needed.   Please make a goal for yourself to try to walk daily. Goal is 150 minutes every week.   If you have any questions or concerns, please do not hesitate to call the office at [phone].  Take Care,   [place] Linton Stolp, DO  Exercising to Lose Weight Exercise is structured, repetitive physical activity to improve fitness and health. Getting regular exercise is important for everyone. It is especially important if you are overweight. Being overweight increases your risk of heart disease, stroke, diabetes, high blood pressure, and several types of cancer. Reducing your calorie intake and exercising can help you lose weight. Exercise is usually categorized as moderate or vigorous intensity. To lose weight, most people need to do a certain amount of moderate-intensity or vigorous-intensity exercise each week. Moderate-intensity exercise  Moderate-intensity exercise is any activity that gets you moving enough to burn at least three times more energy (calories) than if you were sitting. Examples of moderate exercise include:  Walking a mile in 15 minutes.  Doing light yard work.  Biking at an easy pace. Most people should get at least 150 minutes (2 hours and 30 minutes) a week of moderate-intensity exercise to maintain their body weight. Vigorous-intensity exercise Vigorous-intensity exercise is any activity that gets you moving enough to burn at least six times more calories than if you were sitting. When you exercise at this intensity, you  should be working hard enough that you are not able to carry on a conversation. Examples of vigorous exercise include:  Running.  Playing a team sport, such as football, basketball, and soccer.  Jumping rope. Most people should get at least 75 minutes (1 hour and 15 minutes) a week of vigorous-intensity exercise to maintain their body weight. How can exercise affect me? When you exercise enough to burn more calories than you eat, you lose weight. Exercise also reduces body fat and builds muscle. The more muscle you have, the more calories you burn. Exercise also:  Improves mood.  Reduces stress and tension.  Improves your overall fitness, flexibility, and endurance.  Increases bone strength. The amount of exercise you need to lose weight depends on:  Your age.  The type of exercise.  Any health conditions you have.  Your overall physical ability. Talk to your health care provider about how much exercise you need and what types of activities are safe for you. What actions can I take to lose weight? Nutrition   Make changes to your diet as told by your health care provider or diet and nutrition specialist (dietitian). This may include: ? Eating fewer calories. ? Eating more protein. ? Eating less unhealthy fats. ? Eating a diet that includes fresh fruits and vegetables, whole grains, low-fat dairy products, and lean protein. ? Avoiding foods with added fat, salt, and sugar.  Drink plenty of water while you exercise to prevent dehydration or heat stroke. Activity  Choose an activity that you enjoy and set realistic goals. Your health care provider can help you make an exercise plan that works  for you.  Exercise at a moderate or vigorous intensity most days of the week. ? The intensity of exercise may vary from person to person. You can tell how intense a workout is for you by paying attention to your breathing and heartbeat. Most people will notice their breathing and  heartbeat get faster with more intense exercise.  Do resistance training twice each week, such as: ? Push-ups. ? Sit-ups. ? Lifting weights. ? Using resistance bands.  Getting short amounts of exercise can be just as helpful as long structured periods of exercise. If you have trouble finding time to exercise, try to include exercise in your daily routine. ? Get up, stretch, and walk around every 30 minutes throughout the day. ? Go for a walk during your lunch break. ? Park your car farther away from your destination. ? If you take public transportation, get off one stop early and walk the rest of the way. ? Make phone calls while standing up and walking around. ? Take the stairs instead of elevators or escalators.  Wear comfortable clothes and shoes with good support.  Do not exercise so much that you hurt yourself, feel dizzy, or get very short of breath. Where to find more information  U.S. Department of Health and Human Services: [employer].at  Centers for Disease Control and Prevention (CDC): http://www.wolf.info/ Contact a health care provider:  Before starting a new exercise program.  If you have questions or concerns about your weight.  If you have a medical problem that keeps you from exercising. Get help right away if you have any of the following while exercising:  Injury.  Dizziness.  Difficulty breathing or shortness of breath that does not go away when you stop exercising.  Chest pain.  Rapid heartbeat. Summary  Being overweight increases your risk of heart disease, stroke, diabetes, high blood pressure, and several types of cancer.  Losing weight happens when you burn more calories than you eat.  Reducing the amount of calories you eat in addition to getting regular moderate or vigorous exercise each week helps you lose weight. This information is not intended to replace advice given to you by your health care provider. Make sure you discuss any questions you have  with your health care provider. Document Released: 10/17/2010 Document Revised: 09/27/2017 Document Reviewed: 09/27/2017 Elsevier Patient Education  2020 [employer].

## 2019-05-25 ENCOUNTER — Other Ambulatory Visit: Payer: Self-pay

## 2019-05-25 DIAGNOSIS — Z20822 Contact with and (suspected) exposure to covid-19: Secondary | ICD-10-CM

## 2019-05-26 LAB — NOVEL CORONAVIRUS, NAA: SARS-CoV-2, NAA: NOT DETECTED

## 2019-05-26 NOTE — Assessment & Plan Note (Signed)
Patient with A1c of 7.1 today.  Encouraged to be compliant with her metformin 1000 mg twice daily.  Urine microalbumin negative. Recent BMP 03/09/2019: NA 140, K 4.2, BUN 9, Cr 0.7, albumin 3.2  -Patient to be coming monthly for evaluation for bariatric surgery.  Encouraged her to make appropriate diet changes as well as exercise regularly

## 2019-05-26 NOTE — Assessment & Plan Note (Signed)
Patient is to be following me monthly for the next 6 months in order for her to qualify for bariatric surgery.  She is also following with nutritional services.  Counseled her today on proper diet choices and encouraged her to exercise routinely with a goal being at least 150 minutes/week of cardio, such as walking.

## 2019-05-26 NOTE — Assessment & Plan Note (Signed)
Patient instructed to be compliant with aspirin and statin.  She was instructed to follow-up with her vascular surgeon regarding her recommendations for her statin and Zetia, given that there was some confusion.  Instructed patient that she should restart her Lipitor 80 mg daily, at least- she reports that she has plenty at home.

## 2019-05-30 ENCOUNTER — Telehealth: Payer: Self-pay | Admitting: *Deleted

## 2019-05-30 DIAGNOSIS — G8929 Other chronic pain: Secondary | ICD-10-CM

## 2019-05-30 DIAGNOSIS — M549 Dorsalgia, unspecified: Secondary | ICD-10-CM

## 2019-05-30 NOTE — Telephone Encounter (Signed)
Patient dropped off complete form for provider to fill out.  She stated that she needs this ASAP or else her employer will cancel her health insurance.  Form placed in providers box for completion.  Jazmin Hartsell,CMA

## 2019-05-31 ENCOUNTER — Encounter: Payer: Self-pay | Admitting: Family Medicine

## 2019-05-31 NOTE — Telephone Encounter (Signed)
Patient presented to the office to pick up paperwork.  I had a lengthy discussion with her and her case manager about her disability paperwork.  I confirmed that the paperwork she desires to be completed is long-term disability support.  She last worked in 2016.  She has been under disability policy since that time.  I confirmed with Dr. Erin Hearing and Dr. Nori Riis regarding our policy for completion of long-term disability paperwork. Our practice does not complete this paperwork.  I discussed this with the patient.  The patient is amenable to a referral to Occupational Medicine.  Discussed with Ms. Leory Plowman that a referral to occupational medicine have been placed.  Ms. Suzan Slick is to expedite referral.  All questions answered patient pleasant and appropriate throughout encounter.

## 2019-05-31 NOTE — Telephone Encounter (Signed)
Left generic voicemail for patient to return call.  As previously documented by her primary care physician we cannot complete this disability paperwork.  I have placed another referral to occupational medicine.  She is more than welcome to call occupational medicine as a referral has been placed.  There was one at [facility] as well as several at local urgent cares.  This is generally an out-of-pocket cost.  Dorris Singh, MD  [facility] Medicine Teaching Service

## 2019-06-12 ENCOUNTER — Other Ambulatory Visit: Payer: Self-pay | Admitting: Family Medicine

## 2019-06-15 ENCOUNTER — Telehealth: Payer: Self-pay | Admitting: Family Medicine

## 2019-06-15 NOTE — Telephone Encounter (Signed)
Spoke with patient and states that last night she had an episode of her nose bleeding and having a hard time stopping it.  She states that she did notice clots as it was trying to stop and also she would cough some blood up to.  I advised patient to seek care in the emergency department but states that she is out of town.  She will see if there is an urgent care near her.  Appt made for Dr. Enid Derry on Monday (on ATC) but asked patient to please seek medical care before this date.  Patient voiced understanding.  She denies any pain and no bleeding today.  Baker Kogler,CMA

## 2019-06-15 NOTE — Telephone Encounter (Signed)
Patient called to inform PCP that she has been coughing up blood and having nose bleeds. Please pt a call.

## 2019-06-19 ENCOUNTER — Ambulatory Visit (INDEPENDENT_AMBULATORY_CARE_PROVIDER_SITE_OTHER): Payer: Medicare HMO | Admitting: Family Medicine

## 2019-06-19 ENCOUNTER — Other Ambulatory Visit: Payer: Self-pay

## 2019-06-19 VITALS — BP 130/60 | HR 80 | Ht 60.0 in | Wt 297.2 lb

## 2019-06-19 DIAGNOSIS — R040 Epistaxis: Secondary | ICD-10-CM

## 2019-06-19 DIAGNOSIS — R3981 Functional urinary incontinence: Secondary | ICD-10-CM

## 2019-06-19 LAB — POCT HEMOGLOBIN: Hemoglobin: 12.9 g/dL (ref 11–14.6)

## 2019-06-19 MED ORDER — SALINE SPRAY 0.65 % NA SOLN: 1.0000 | NASAL | 1 refills | Status: DC | PRN

## 2019-06-19 NOTE — Patient Instructions (Signed)
Thank you for coming to see me today. It was a pleasure! Today we talked about:   I will release your results on MyChart.   Please follow-up with me in 1 month or sooner as needed.  If you have any questions or concerns, please do not hesitate to call the office at [phone].  Take Care,   Elizabeth Cleofas Hudgins, DO  Nosebleed, Adult A nosebleed is when blood comes out of the nose. Nosebleeds are common. Usually, they are not a sign of a serious condition. Nosebleeds can happen if a small blood vessel in your nose starts to bleed or if the lining of your nose (mucous membrane) cracks. They are commonly caused by:  Allergies.  Colds.  Picking your nose.  Blowing your nose too hard.  An injury from sticking an object into your nose or getting hit in the nose.  Dry or cold air. Less common causes of nosebleeds include:  Toxic fumes.  Something abnormal in the nose or in the air-filled spaces in the bones of the face (sinuses).  Growths in the nose, such as polyps.  Medicines or conditions that cause blood to clot slowly.  Certain illnesses or procedures that irritate or dry out the nasal passages. Follow these instructions at home: When you have a nosebleed:   Sit down and tilt your head slightly forward.  Use a clean towel or tissue to pinch your nostrils under the bony part of your nose. After 10 minutes, let go of your nose and see if bleeding starts again. Do not release pressure before that time. If there is still bleeding, repeat the pinching and holding for 10 minutes until the bleeding stops.  Do not place tissues or gauze in the nose to stop bleeding.  Avoid lying down and avoid tilting your head backward. That may make blood collect in the throat and cause gagging or coughing.  Use a nasal spray decongestant to help with a nosebleed as told by your health care provider.  Do not use petroleum jelly or mineral oil in your nose. It can drip into your lungs. After  a nosebleed:  Avoid blowing your nose or sniffing for a number of hours.  Avoid straining, lifting, or bending at the waist for several days. You may resume other normal activities as you are able.  Use saline spray or a humidifier as told by your health care provider.  Aspirinand blood thinners make bleeding more likely. If you are prescribed these medicines and you suffer from nosebleeds: ? Ask your health care provider if you should stop taking the medicines or if you should adjust the dose. ? Do not stop taking medicines that your health care provider has recommended unless told by your health care provider.  If your nosebleed was caused by dry mucous membranes, use over-the-counter saline nasal spray or gel. This will keep the mucous membranes moist and allow them to heal. If you must use a lubricant: ? Choose one that is water-soluble. ? Use only as much as you need and use it only as often as needed. ? Do not lie down until several hours after you use it. Contact a health care provider if:  You have a fever.  You get nosebleeds often or more often than usual.  You bruise very easily.  You have a nosebleed from having something stuck in your nose.  You have bleeding in your mouth.  You vomit or cough up brown material.  You have a nosebleed after  you start a new medicine. Get help right away if:  You have a nosebleed after a fall or a head injury.  Your nosebleed does not go away after 20 minutes.  You feel dizzy or weak.  You have unusual bleeding from other parts of your body.  You have unusual bruising on other parts of your body.  You become sweaty.  You vomit blood. This information is not intended to replace advice given to you by your health care provider. Make sure you discuss any questions you have with your health care provider. Document Released: 06/24/2005 Document Revised: 12/14/2017 Document Reviewed: 03/31/2016 Elsevier Patient Education  2020  [employer].

## 2019-06-19 NOTE — Progress Notes (Signed)
Subjective:  Patient ID: Elizabeth Diaz  DOB: [birth year] MRN: [id number]  Elizabeth Diaz is a 53 y.o. female with a PMH of HFpEF, PAD (01/2016 Atherectomy and DCB PTA of prox R. SFA, 10/2016 R. SFA PTA/stenting, 05/2018 PTA/stenting R. SFA), HTN, dyslipidemia,T2DM, GERD and morbid obesity, here today for epistaxis.   HPI:  Epistaxis: - Nose bleeds with cough and splitting up blood with some clots. It doesn't happen every day but it is becoming more frequent.  -She reports it started the day after she saw me last. -only spitting up blood with nose bleeds - no sob, cough, chest pain - She was in bed and noticed blood coming out and she will blow her nose and there is blood and sometimes clots.  -Bleeds last about 20 minutes of bleeding -Wednesday night lasted about an hour.  -Has had no trauma. Will blow nose and it will come out but not only after she blows -has been feeling dizzy and lightheaded on and off, but not at risk for falls  Morbid obesity -Patient has been following with a nutritionist regularly is attempting to get bariatric surgery done.  In order for her insurance to be able to pay this patient will need to follow-up with me monthly for the next 6 months. Patient is to schedule follow-up accordingly.  -Patient has been trying to have walk daily, but does still have pretty significant pain in her knees, she requested a sitting walker, but discussed that only through exercise will she improve and that the walker would limit her, and she would likely not be approved - she has been working on changing her diet -has history of PAD with a recommendation from her vascular surgeon to exercise as she is no longer a surgical candidate  Incontinence: Trouble with functional incontinence, stable but would like supplies if able for when she travels as she often cannot make it to the bathroom in time. Previously controlled on mybetriq  ROS:  as mentioned in HPI  Social hx: Denies use of  illicit drugs, alcohol use Smoking status reviewed  Patient Active Problem List   Diagnosis Date Noted  . Epistaxis 06/22/2019  . Meralgia paraesthetica 11/10/2018  . Bipolar affective disorder, depressed ([place]) 08/27/2018  . Type 2 diabetes mellitus, controlled ([place]) 01/27/2018  . COPD (chronic obstructive pulmonary disease) ([place]) 12/29/2016  . Mixed hyperlipidemia 12/29/2016  . GERD (gastroesophageal reflux disease) 12/29/2016  . Gastro-esophageal reflux disease without esophagitis 08/09/2016  . Carotid bruit 08/09/2016  . PAD (peripheral artery disease) ([place]) 02/25/2016  . Obstructive sleep apnea, adult 02/07/2016  . Exertional dyspnea 02/07/2016  . HTN, goal below 140/90 01/23/2016  . Atherosclerosis with limb claudication ([place]) 01/23/2016  . Claudication in peripheral vascular disease ([place]) 01/06/2016  . Lower extremity edema 12/12/2015  . Hearing loss 11/27/2015  . Morbid obesity due to excess calories ([place]) 10/29/2015  . Excessive daytime sleepiness 10/05/2015  . History of depression 10/05/2015  . Chronic diarrhea   . Abdominal pain, epigastric   . Bilateral lower extremity edema 04/08/2015  . Urinary incontinence 04/08/2015  . Bilateral chronic knee pain 12/07/2014  . Benign neoplasm of rectum   . IBS (irritable bowel syndrome)   . Chronic back pain 05/14/2014     Objective:  BP 130/60   Pulse 80   Ht 5' (1.524 m)   Wt 297 lb 4 oz (134.8 kg)   SpO2 97%   BMI 58.05 kg/m   Vitals and nursing note reviewed  General: NAD,  pleasant, obese HEENT: no blood noted in nasal canal Cardiac: RRR, normal heart sounds, no m/r/g Pulm: normal effort, CTAB Extremities: no edema or cyanosis. WWP. Skin: warm and dry, no rashes noted Neuro: alert and oriented, no focal deficits Psych: normal affect, normal thought content  Assessment & Plan:   Urinary incontinence Incontinence supplies ordered today. Patient on myrbetric  Morbid obesity due to excess calories ([place])  Counseled on need for continued exercise and diet changes, discussed ways to achieve goals   Epistaxis Could be due to weather change but patient also on plavix. Needs to be on plavix 2/2 PAD. Patient counseled on proper technique to stop nose bleeds and instructed to report to DR office if she cannot stop bleed within 15 min of pressure on nasal bridge. Given handout. Given ocean nasal spray if it is due to dry nasal passages and patient encouraged to get a humidifier.   [place] Arriah Wadle, DO Family Medicine Resident PGY-3

## 2019-06-20 LAB — CBC
Hematocrit: 37.4 % (ref 34.0–46.6)
Hemoglobin: 12.5 g/dL (ref 11.1–15.9)
MCH: 29.4 pg (ref 26.6–33.0)
MCHC: 33.4 g/dL (ref 31.5–35.7)
MCV: 88 fL (ref 79–97)
Platelets: 305 10*3/uL (ref 150–450)
RBC: 4.25 x10E6/uL (ref 3.77–5.28)
RDW: 13 % (ref 11.7–15.4)
WBC: 8.9 10*3/uL (ref 3.4–10.8)

## 2019-06-22 DIAGNOSIS — R040 Epistaxis: Secondary | ICD-10-CM | POA: Insufficient documentation

## 2019-06-22 NOTE — Assessment & Plan Note (Signed)
Counseled on need for continued exercise and diet changes, discussed ways to achieve goals

## 2019-06-22 NOTE — Assessment & Plan Note (Signed)
Incontinence supplies ordered today. Patient on myrbetric

## 2019-06-22 NOTE — Assessment & Plan Note (Signed)
Could be due to weather change but patient also on plavix. Needs to be on plavix 2/2 PAD. Patient counseled on proper technique to stop nose bleeds and instructed to report to DR office if she cannot stop bleed within 15 min of pressure on nasal bridge. Given handout. Given ocean nasal spray if it is due to dry nasal passages and patient encouraged to get a humidifier.

## 2019-06-26 ENCOUNTER — Ambulatory Visit: Payer: Medicare HMO | Admitting: Family Medicine

## 2019-06-26 ENCOUNTER — Other Ambulatory Visit: Payer: Self-pay | Admitting: Family Medicine

## 2019-06-26 DIAGNOSIS — R600 Localized edema: Secondary | ICD-10-CM

## 2019-07-08 ENCOUNTER — Emergency Department (HOSPITAL_COMMUNITY)
Admission: EM | Admit: 2019-07-08 | Discharge: 2019-07-08 | Disposition: A | Discharge status: Home / Self Care | Payer: Medicare HMO | Attending: Emergency Medicine | Admitting: Emergency Medicine

## 2019-07-08 ENCOUNTER — Encounter (HOSPITAL_COMMUNITY): Payer: Self-pay | Admitting: *Deleted

## 2019-07-08 ENCOUNTER — Other Ambulatory Visit: Payer: Self-pay

## 2019-07-08 DIAGNOSIS — I10 Essential (primary) hypertension: Secondary | ICD-10-CM | POA: Insufficient documentation

## 2019-07-08 DIAGNOSIS — R040 Epistaxis: Secondary | ICD-10-CM | POA: Diagnosis not present

## 2019-07-08 DIAGNOSIS — Z7984 Long term (current) use of oral hypoglycemic drugs: Secondary | ICD-10-CM | POA: Diagnosis not present

## 2019-07-08 DIAGNOSIS — E119 Type 2 diabetes mellitus without complications: Secondary | ICD-10-CM | POA: Insufficient documentation

## 2019-07-08 DIAGNOSIS — Z7982 Long term (current) use of aspirin: Secondary | ICD-10-CM | POA: Diagnosis not present

## 2019-07-08 DIAGNOSIS — Z87891 Personal history of nicotine dependence: Secondary | ICD-10-CM | POA: Diagnosis not present

## 2019-07-08 DIAGNOSIS — E669 Obesity, unspecified: Secondary | ICD-10-CM | POA: Diagnosis not present

## 2019-07-08 DIAGNOSIS — J449 Chronic obstructive pulmonary disease, unspecified: Secondary | ICD-10-CM | POA: Insufficient documentation

## 2019-07-08 DIAGNOSIS — Z79899 Other long term (current) drug therapy: Secondary | ICD-10-CM | POA: Diagnosis not present

## 2019-07-08 DIAGNOSIS — Z6841 Body Mass Index (BMI) 40.0 and over, adult: Secondary | ICD-10-CM | POA: Diagnosis not present

## 2019-07-08 LAB — COMPREHENSIVE METABOLIC PANEL
ALT: 43 U/L (ref 0–44)
AST: 28 U/L (ref 15–41)
Albumin: 3 g/dL — ABNORMAL LOW (ref 3.5–5.0)
Alkaline Phosphatase: 84 U/L (ref 38–126)
Anion gap: 10 (ref 5–15)
BUN: 6 mg/dL (ref 6–20)
CO2: 25 mmol/L (ref 22–32)
Calcium: 8.8 mg/dL — ABNORMAL LOW (ref 8.9–10.3)
Chloride: 103 mmol/L (ref 98–111)
Creatinine, Ser: 0.71 mg/dL (ref 0.44–1.00)
GFR calc Af Amer: >60 mL/min (ref >60)
GFR calc non Af Amer: >60 mL/min (ref >60)
Glucose, Bld: 259 mg/dL — ABNORMAL HIGH (ref 70–99)
Potassium: 3.9 mmol/L (ref 3.5–5.1)
Sodium: 138 mmol/L (ref 135–145)
Total Bilirubin: 0.3 mg/dL (ref 0.3–1.2)
Total Protein: 6.7 g/dL (ref 6.5–8.1)

## 2019-07-08 LAB — CBC
HCT: 37.7 % (ref 36.0–46.0)
Hemoglobin: 12 g/dL (ref 12.0–15.0)
MCH: 29.8 pg (ref 26.0–34.0)
MCHC: 31.8 g/dL (ref 30.0–36.0)
MCV: 93.5 fL (ref 80.0–100.0)
Platelets: 276 10*3/uL (ref 150–400)
RBC: 4.03 MIL/uL (ref 3.87–5.11)
RDW: 13 % (ref 11.5–15.5)
WBC: 9.2 10*3/uL (ref 4.0–10.5)
nRBC: 0 % (ref 0.0–0.2)

## 2019-07-08 MED ORDER — OXYMETAZOLINE HCL 0.05 % NA SOLN
1.0000 | Freq: Once | NASAL | Status: AC
  Administered 2019-07-08: 1 via NASAL
  Filled 2019-07-08: qty 30

## 2019-07-08 MED ORDER — AFRIN NASAL SPRAY 0.05 % NA SOLN: 1.0000 | Freq: Two times a day (BID) | NASAL | 0 refills | Status: DC

## 2019-07-08 NOTE — ED Provider Notes (Signed)
[facility] EMERGENCY DEPARTMENT Provider Note   CSN: [id number] Arrival date & time: 07/08/19  1834     History   Chief Complaint Chief Complaint  Patient presents with  . Epistaxis    HPI Elizabeth Diaz is a 53 y.o. female.     HPI  53 year old female presents with nosebleeds.  Has been ongoing for months.  Comes and goes.  Has occurred 4 times this week, including today.  It has stopped on its own.  Often she will place tissues into her nose with some pinching the bridge of her nose after 20-30 minutes.  Some headaches on and off but no headaches today.  No nasal injuries.  No recent infection.  Sometimes lightheaded though not now.  Is on a baby aspirin. Today was coming out of both nares, usually right side is involved.   Past Medical History:  Diagnosis Date  . Arthritis   . Chronic pain   . Congestive heart failure (CHF) ([place])   . COPD (chronic obstructive pulmonary disease) ([place])   . Diabetes mellitus ([place])   . Diverticulosis   . HTN (hypertension) 12/29/2016  . Hyperplastic colon polyp   . Numbness    left leg  . Obesity   . Pneumothorax   . Schatzki's ring   . Seizures (H[facility])    hx of, no seizures last few years  . Type 2 diabetes mellitus ([place]) 01/27/2018    Patient Active Problem List   Diagnosis Date Noted  . Epistaxis 06/22/2019  . Meralgia paraesthetica 11/10/2018  . Bipolar affective disorder, depressed ([place]) 08/27/2018  . Type 2 diabetes mellitus, controlled ([place]) 01/27/2018  . COPD (chronic obstructive pulmonary disease) ([place]) 12/29/2016  . Mixed hyperlipidemia 12/29/2016  . GERD (gastroesophageal reflux disease) 12/29/2016  . Gastro-esophageal reflux disease without esophagitis 08/09/2016  . Carotid bruit 08/09/2016  . PAD (peripheral artery disease) ([place]) 02/25/2016  . Obstructive sleep apnea, adult 02/07/2016  . Exertional dyspnea 02/07/2016  . HTN, goal below 140/90 01/23/2016  . Atherosclerosis with limb claudication  ([place]) 01/23/2016  . Claudication in peripheral vascular disease ([place]) 01/06/2016  . Lower extremity edema 12/12/2015  . Hearing loss 11/27/2015  . Morbid obesity due to excess calories ([place]) 10/29/2015  . Excessive daytime sleepiness 10/05/2015  . History of depression 10/05/2015  . Chronic diarrhea   . Abdominal pain, epigastric   . Bilateral lower extremity edema 04/08/2015  . Urinary incontinence 04/08/2015  . Bilateral chronic knee pain 12/07/2014  . Benign neoplasm of rectum   . IBS (irritable bowel syndrome)   . Chronic back pain 05/14/2014    Past Surgical History:  Procedure Laterality Date  . CHEST TUBE INSERTION    . COLONOSCOPY WITH PROPOFOL N/A 11/01/2014   Procedure: COLONOSCOPY WITH PROPOFOL;  Surgeon: Jerene Bears, MD;  Location: [facility];  Service: Gastroenterology;  Laterality: N/A;  . ESOPHAGOGASTRODUODENOSCOPY (EGD) WITH PROPOFOL N/A 07/29/2015   Procedure: ESOPHAGOGASTRODUODENOSCOPY (EGD) WITH PROPOFOL;  Surgeon: Jerene Bears, MD;  Location: [facility];  Service: Gastroenterology;  Laterality: N/A;  . PERIPHERAL VASCULAR CATHETERIZATION N/A 01/07/2016   Procedure: Lower Extremity Angiography;  Surgeon: Adrian Prows, MD;  Location: [facility];  Service: Cardiovascular;  Laterality: N/A;  . PERIPHERAL VASCULAR CATHETERIZATION Right 02/25/2016   Procedure: Peripheral Vascular Atherectomy;  Surgeon: Adrian Prows, MD;  Location: [facility];  Service: Cardiovascular;  Laterality: Right;  SFA ATHERECTOMY/DRUG COATED PTA  . TUBAL LIGATION       OB History  No obstetric history on file.      Home Medications    Prior to Admission medications   Medication Sig Start Date End Date Taking? Authorizing Provider  ACCU-CHEK SOFTCLIX LANCETS lancets Use as instructed to test sugars once daily.  Dx code:E11.9 11/30/18   Zenia Resides, MD  albuterol (ACCUNEB) 1.25 MG/3ML nebulizer solution Take 3 mLs (1.25 mg total) by nebulization every 6 (six) hours as  needed. 11/08/18   Shirley, [place], DO  aspirin 81 MG chewable tablet Chew 81 mg by mouth daily.    [provider]  atorvastatin (LIPITOR) 80 MG tablet Take 1 tablet (80 mg total) by mouth daily. 11/08/18   Shirley, [place], DO  baclofen (LIORESAL) 10 MG tablet Take 1 tablet (10 mg total) by mouth 3 (three) times daily. 05/23/19   Shirley, [place], DO  blood glucose meter kit and supplies KIT Dispense based on patient and insurance preference. Use once daily as directed. Dx code E11.9. 11/30/18   Zenia Resides, MD  Blood Glucose Monitoring Suppl (ACCU-CHEK AVIVA) device Use as instructed to test sugars once daily.  Dx code:E11.9 11/30/18 11/30/19  Zenia Resides, MD  budesonide-formoterol (SYMBICORT) 80-4.5 MCG/ACT inhaler Inhale 2 puffs into the lungs 2 (two) times daily. 11/08/18   Shirley, [place], DO  chlorhexidine (PERIDEX) 0.12 % solution RINSE WITH 15ML FOR 30 SECONDS AND SPIT USE TWICE DAILY AFTER BRUSHING AND FLOSSING . 10/13/18   [provider]  colestipol (COLESTID) 1 g tablet Take 2 g by mouth 2 (two) times daily. 06/06/19   [provider]  dicyclomine (BENTYL) 10 MG capsule Take 10 mg by mouth. 06/06/19 06/05/20  [provider]  ezetimibe (ZETIA) 10 MG tablet Take 1 tablet (10 mg total) by mouth daily. 11/08/18 11/08/19  Shirley, [place], DO  furosemide (LASIX) 40 MG tablet TAKE 1 TABLET BY MOUTH DAILY AS NEEDED FOR  EDEMA 06/26/19   Shirley, [place], DO  glucose blood (ACCU-CHEK AVIVA) test strip Use as instructed to test sugars once daily.  Dx code:E11.9 11/30/18   Zenia Resides, MD  hydrOXYzine (ATARAX/VISTARIL) 25 MG tablet Take 25 mg by mouth every 4 (four) hours as needed.    [provider]  Lancets Misc. (ACCU-CHEK SOFTCLIX LANCET DEV) KIT Use as instructed to test sugars once daily.  Dx code:E11.9 11/30/18   Zenia Resides, MD  metFORMIN (GLUCOPHAGE-XR) 500 MG 24 hr tablet Take 2 tablets (1,000 mg total) by mouth 2 (two) times daily with a meal.  06/12/19 07/12/19  Enid Derry, [place], DO  oxymetazoline (AFRIN NASAL SPRAY) 0.05 % nasal spray Place 1 spray into both nostrils 2 (two) times daily. 07/08/19   Sherwood Gambler, MD  pregabalin (LYRICA) 25 MG capsule Take 1 capsule by mouth twice daily 06/12/19   Shirley, [place], DO  sodium chloride (OCEAN) 0.65 % SOLN nasal spray Place 1 spray into both nostrils as needed for congestion. 06/19/19   Shirley, [place], DO    Family History Family History  Problem Relation Age of Onset  . Heart failure Father   . Diabetes Father   . Diabetes Maternal Grandmother   . Diabetes Paternal Grandfather   . Parkinson's disease Paternal Grandfather   . Cancer Maternal Grandfather        unknown type    Social History Social History   Tobacco Use  . Smoking status: Former Smoker    Packs/day: 1.50    Years: 0.00    Pack years: 0.00    Types: Cigarettes  Quit date: 05/04/2009    Years since quitting: 10.1  . Smokeless tobacco: Never Used  Substance Use Topics  . Alcohol use: Yes    Comment: occasionally  . Drug use: Not Currently    Types: Cocaine, Marijuana    Comment: 20 year history of crack cocaine. Haven't used any in two years.      Allergies   Patient has no known allergies.   Review of Systems Review of Systems  HENT: Positive for nosebleeds.   Respiratory: Negative for cough and shortness of breath.   Gastrointestinal: Positive for vomiting (regurgitated some of the blood).  Neurological: Positive for headaches.  All other systems reviewed and are negative.    Physical Exam Updated Vital Signs BP (!) 133/58 (BP Location: Right Wrist)   Pulse 80   Temp 98.4 F (36.9 C) (Oral)   Resp 18   Ht 5' (1.524 m)   Wt 131.5 kg   SpO2 98%   BMI 56.64 kg/m   Physical Exam Vitals signs and nursing note reviewed.  Constitutional:      General: She is not in acute distress.    Appearance: She is well-developed. She is obese. She is not ill-appearing or diaphoretic.  HENT:      Head: Normocephalic and atraumatic.     Right Ear: External ear normal.     Left Ear: External ear normal.     Nose: Nose normal. No nasal deformity, signs of injury or nasal tenderness.     Right Nostril: No foreign body, epistaxis or septal hematoma.     Left Nostril: No foreign body, epistaxis or septal hematoma.     Comments: No obvious cause of bleeding. No current bleeding Eyes:     General:        Right eye: No discharge.        Left eye: No discharge.  Cardiovascular:     Rate and Rhythm: Normal rate and regular rhythm.     Heart sounds: Normal heart sounds.  Pulmonary:     Effort: Pulmonary effort is normal.     Breath sounds: Normal breath sounds.  Abdominal:     General: There is no distension.  Skin:    General: Skin is warm and dry.  Neurological:     Mental Status: She is alert.  Psychiatric:        Mood and Affect: Mood is not anxious.      ED Treatments / Results  Labs (all labs ordered are listed, but only abnormal results are displayed) Labs Reviewed  COMPREHENSIVE METABOLIC PANEL - Abnormal; Notable for the following components:      Result Value   Glucose, Bld 259 (*)    Calcium 8.8 (*)    Albumin 3.0 (*)    All other components within normal limits  CBC    EKG None  Radiology No results found.  Procedures Procedures (including critical care time)  Medications Ordered in ED Medications  oxymetazoline (AFRIN) 0.05 % nasal spray 1 spray (has no administration in time range)     Initial Impression / Assessment and Plan / ED Course  I have reviewed the triage vital signs and the nursing notes.  Pertinent labs & imaging results that were available during my care of the patient were reviewed by me and considered in my medical decision making (see chart for details).        Patient is not bleeding here.  Appears well.  No obvious area of cause for bleeding  such as Kiesselbach's plexus.  At this point, will prescribe Afrin and refer to  ENT but no acute intervention is needed.  Labs from triage are benign.  Final Clinical Impressions(s) / ED Diagnoses   Final diagnoses:  Epistaxis    ED Discharge Orders         Ordered    oxymetazoline (AFRIN NASAL SPRAY) 0.05 % nasal spray  2 times daily     07/08/19 2057           Sherwood Gambler, MD 07/08/19 2109

## 2019-07-08 NOTE — ED Triage Notes (Signed)
Nosebleed x 4 today  No blood thinners  Minimal bleeding at present

## 2019-07-18 ENCOUNTER — Ambulatory Visit (INDEPENDENT_AMBULATORY_CARE_PROVIDER_SITE_OTHER): Payer: Medicare HMO | Admitting: Family Medicine

## 2019-07-18 ENCOUNTER — Other Ambulatory Visit: Payer: Self-pay

## 2019-07-18 ENCOUNTER — Encounter: Payer: Self-pay | Admitting: Family Medicine

## 2019-07-18 DIAGNOSIS — R3981 Functional urinary incontinence: Secondary | ICD-10-CM

## 2019-07-18 MED ORDER — MIRABEGRON ER 25 MG PO TB24: 25.0000 mg | ORAL_TABLET | Freq: Every day | ORAL | 1 refills | Status: DC

## 2019-07-18 NOTE — Progress Notes (Addendum)
Subjective:  Patient ID: Elizabeth Diaz  DOB: September 30, 1965 MRN: [id number]  Elizabeth Diaz is a 53 y.o. female with a PMH of HFpEF, PAD (01/2016 Atherectomy and DCB PTA of prox R. SFA, 10/2016 R. SFA PTA/stenting, 05/2018 PTA/stenting R. SFA), HTN, dyslipidemia,T2DM, GERD and morbid obesity, here today for handicap placard, weight loss.   HPI:  Morbid obesity -Patient has been following with a nutritionist regularly is attempting to get bariatric surgery done. In order for her insurance to be able to pay this patient will need to follow-up with me monthly for the next 6 months. Patient is to schedule follow-up accordingly. - she has gained a few pounds since last visit, and states her baclofen is not working for her pain - she understands the need to adhere to a strict diet before any bariatric surgery bc this is a huge surgery and risks of regaining the weight if you cannot adhere to diet program are high -she has been working on changing her diet, but has not done well the past month -has history of PAD with a recommendation from her vascular surgeon to exercise as she is no longer a surgical candidate- she states she has been outside trying to play basketball with her kids  Handicap Placard: -requesting temporary handicap placard due to not being able to walk long distances, she is aware that if she has surgery then she will not need a handicap placard at that point.  Patient is also aware that she needs to continue trying to walk as much as possible given her PAD and need for weight loss  Urinary Incontinence: Patient had been previously controlled with detrol, but now her tolterodine ER is not working and she is requiring more adult diapers. We had previously tried [place] but [employer] would not cover. Patient would like to try this medication again   ROS: As mentioned in HPI  Social hx: Denies use of illicit drugs, alcohol use Smoking status reviewed  Patient Active  Problem List   Diagnosis Date Noted  . Epistaxis 06/22/2019  . Meralgia paraesthetica 11/10/2018  . Bipolar affective disorder, depressed ([place]) 08/27/2018  . Type 2 diabetes mellitus, controlled ([place]) 01/27/2018  . COPD (chronic obstructive pulmonary disease) ([place]) 12/29/2016  . Mixed hyperlipidemia 12/29/2016  . GERD (gastroesophageal reflux disease) 12/29/2016  . Gastro-esophageal reflux disease without esophagitis 08/09/2016  . Carotid bruit 08/09/2016  . PAD (peripheral artery disease) ([place]) 02/25/2016  . Obstructive sleep apnea, adult 02/07/2016  . Exertional dyspnea 02/07/2016  . HTN, goal below 140/90 01/23/2016  . Atherosclerosis with limb claudication ([place]) 01/23/2016  . Claudication in peripheral vascular disease ([place]) 01/06/2016  . Lower extremity edema 12/12/2015  . Hearing loss 11/27/2015  . Morbid obesity due to excess calories ([place]) 10/29/2015  . Excessive daytime sleepiness 10/05/2015  . History of depression 10/05/2015  . Chronic diarrhea   . Abdominal pain, epigastric   . Bilateral lower extremity edema 04/08/2015  . Urinary incontinence 04/08/2015  . Bilateral chronic knee pain 12/07/2014  . Benign neoplasm of rectum   . IBS (irritable bowel syndrome)   . Chronic back pain 05/14/2014     Objective:  BP 130/68   Pulse 94   Ht 5' (1.524 m)   Wt 296 lb 6 oz (134.4 kg)   SpO2 97%   BMI 57.88 kg/m   Vitals and nursing note reviewed  General: NAD, pleasant Pulm: normal effort Extremities: no edema or cyanosis. WWP. Skin: warm and dry, no rashes noted Neuro:  alert and oriented, no focal deficits Psych: normal affect, normal thought content  Assessment & Plan:   Morbid obesity due to excess calories [facility]) Patient not exercising or eating appropriately.  Counseled her on the fact that bariatric surgery she needs to show good effort in order to lose weight prior to the surgery.  She was counseled on the fact that she is a poor candidate if she is unable  to adhere to a strict diet and at high risk for failure.  She stated that she has just not had the right mindset recently but she will start trying to do better by the next visit.  Urinary incontinence Patient has never been on myrbetric, as she could not afford and insurance did not cover. She is now failing tolterodine. Will try again to have myrbetric covered, given that she has failed other therapies.    Elizabeth Nisreen Guise, DO Family Medicine Resident PGY-3

## 2019-07-20 NOTE — Patient Instructions (Signed)
Thank you for coming to see me today. It was a pleasure!   If you have any questions or concerns, please do not hesitate to call the office at [phone].  Take Care,   [place] Rendell Thivierge, DO

## 2019-07-20 NOTE — Assessment & Plan Note (Signed)
Patient not exercising or eating appropriately.  Counseled her on the fact that bariatric surgery she needs to show good effort in order to lose weight prior to the surgery.  She was counseled on the fact that she is a poor candidate if she is unable to adhere to a strict diet and at high risk for failure.  She stated that she has just not had the right mindset recently but she will start trying to do better by the next visit.

## 2019-07-21 ENCOUNTER — Encounter: Payer: Self-pay | Admitting: Family Medicine

## 2019-07-21 MED ORDER — CYCLOBENZAPRINE HCL 5 MG PO TABS: 5.0000 mg | ORAL_TABLET | Freq: Three times a day (TID) | ORAL | 1 refills | Status: DC | PRN

## 2019-07-21 NOTE — Addendum Note (Signed)
Addended by: Pinchus Weckwerth, [place] J on: 07/21/2019 11:31 AM   Modules accepted: Orders

## 2019-07-21 NOTE — Assessment & Plan Note (Signed)
Patient has never been on myrbetric, as she could not afford and insurance did not cover. She is now failing tolterodine. Will try again to have myrbetric covered, given that she has failed other therapies.

## 2019-07-30 ENCOUNTER — Other Ambulatory Visit: Payer: Self-pay | Admitting: Family Medicine

## 2019-07-30 DIAGNOSIS — R600 Localized edema: Secondary | ICD-10-CM

## 2019-08-02 ENCOUNTER — Telehealth: Payer: Self-pay | Admitting: Family Medicine

## 2019-08-02 NOTE — Telephone Encounter (Signed)
Pt came in office requesting a GI referral to [facility] at [facility].

## 2019-08-02 NOTE — Telephone Encounter (Signed)
Will check with referral coordinator to check on this.  It was originally sent to [facility] in 03/2019. Jazmin Hartsell,CMA

## 2019-08-11 ENCOUNTER — Ambulatory Visit (INDEPENDENT_AMBULATORY_CARE_PROVIDER_SITE_OTHER): Payer: Medicare HMO | Admitting: Family Medicine

## 2019-08-11 ENCOUNTER — Other Ambulatory Visit: Payer: Self-pay

## 2019-08-11 VITALS — BP 130/80 | HR 89 | Ht 60.0 in | Wt 294.2 lb

## 2019-08-11 DIAGNOSIS — E119 Type 2 diabetes mellitus without complications: Secondary | ICD-10-CM | POA: Diagnosis not present

## 2019-08-11 DIAGNOSIS — R899 Unspecified abnormal finding in specimens from other organs, systems and tissues: Secondary | ICD-10-CM | POA: Diagnosis not present

## 2019-08-11 LAB — POCT GLYCOSYLATED HEMOGLOBIN (HGB A1C): HbA1c, POC (controlled diabetic range): 8.1 % — AB (ref 0.0–7.0)

## 2019-08-11 NOTE — Assessment & Plan Note (Signed)
Patient weight down by 2 pounds.  Attempting to control diet better.  Still not exercising.  Patient follow-up in 1 month with PCP.

## 2019-08-11 NOTE — Assessment & Plan Note (Signed)
Uncontrolled, worse.  Only on Metformin.  Patient follow-up PCP discussed additional medications.

## 2019-08-11 NOTE — Progress Notes (Signed)
    Subjective:  Elizabeth Diaz is a 53 y.o. female who presents to the [facility] today with a chief complaint of abnormal labs and weight check.   HPI:  Abnormal labs Patient donates plasma biweekly.  This is for financial reasons.  Patient is screened by the plasma center, [facility].  He brings in a handwritten note and says that she had "elevated beta levels" and low albumin.  Patient says that she needs clearance from her doctor in order to be able to donate plasma again.  Patient was screened by [facility] on 06/2019.  She had mild hypoalbuminemia of 3.0.   Weight check Patient is attempting to get bariatric surgery.  She is elevated BMI of 57.  She has to be seen by her PCP monthly for 6 months for weight check.  Patient states that she has been trying to watch her diet by reducing fried foods, decreasing sweet tea intake, increasing her fruits and vegetables.  She says that she is difficulty exercising given the colder weather and COVID-19.  Patient's weight is down 2 pounds today at 294.  Diabetes mellitus Patient endorses some polyuria and polydipsia.  Her A1c today is 8.1, this is up from last time at 7.1.  Patient is only on Metformin.  ROS: Per HPI  PMH: Smoking history reviewed.    Objective:  Physical Exam: BP 130/80   Pulse 89   Ht 5' (1.524 m)   Wt 294 lb 4 oz (133.5 kg)   SpO2 99%   BMI 57.47 kg/m   Gen: NAD, resting comfortably HEENT: [place]/AT, MMM CV: RRR with no murmurs appreciated Pulm: NWOB, CTAB with no crackles, wheezes, or rhonchi Skin: warm, dry Neuro: grossly normal, moves all extremities Psych: Normal affect and thought content  Results for orders placed or performed in visit on 08/11/19 (from the past 72 hour(s))  HgB A1c     Status: Abnormal   Collection Time: 08/11/19 11:10 AM  Result Value Ref Range   Hemoglobin A1C     HbA1c POC (<> result, manual entry)     HbA1c, POC (prediabetic range)     HbA1c, POC (controlled diabetic range) 8.1 (A) 0.0 - 7.0 %      Assessment/Plan:  Type 2 diabetes mellitus, controlled (HCC) Uncontrolled, worse.  Only on Metformin.  Patient follow-up PCP discussed additional medications.   Morbid obesity due to excess calories ([place]) Patient weight down by 2 pounds.  Attempting to control diet better.  Still not exercising.  Patient follow-up in 1 month with PCP.  Abnormal laboratory test result Patient presenting with abnormal labs from her plasma donation site.  This includes "elevated beta".  It is hard to determine what context is from.  Recommend the patient request records from the plasma donation center to further elucidate what is meant by this.  Possibly hematologic abnormality, however I am uncertain of the clinical significance.  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  This lesion was felt to be benign and has not yet been biopsied.  May be prudent for her to follow-up with hematology/oncology once we have the labs.    Lab Orders     HgB A1c  No orders of the defined types were placed in this encounter.     Marny Lowenstein, MD, MS FAMILY MEDICINE RESIDENT - PGY3 08/11/2019 4:27 PM

## 2019-08-11 NOTE — Patient Instructions (Signed)
It was a pleasure to see you today! Thank you for choosing [facility] for your primary care. Elizabeth Diaz was seen for my checkup and abnormal blood levels.   Your weight is down.  Continue to work on diet and exercise as you can.  We will follow-up in 1 month for weight check.  For your abnormal blood labs, please provide a release of records from [facility].  This way we can better understand what they mean by elevated beta levels.  Please follow-up with [place] once these records have been received.  Your A1c was higher today at 8.1.  Please follow-up with Dr. Enid Derry to discuss diabetic changes going forward.  Best,  Marny Lowenstein, MD, MS FAMILY MEDICINE RESIDENT - PGY3 08/11/2019 11:28 AM

## 2019-08-11 NOTE — Assessment & Plan Note (Signed)
Patient presenting with abnormal labs from her plasma donation site.  This includes "elevated beta".  It is hard to determine what context is from.  Recommend the patient request records from the plasma donation center to further elucidate what is meant by this.  Possibly hematologic abnormality, however I am uncertain of the clinical significance.  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  This lesion was felt to be benign and has not yet been biopsied.  May be prudent for her to follow-up with hematology/oncology once we have the labs.

## 2019-09-07 ENCOUNTER — Telehealth: Payer: Self-pay | Admitting: Family Medicine

## 2019-09-07 NOTE — Telephone Encounter (Signed)
Will send to referral coordinator. Salvatore Marvel, CMA

## 2019-09-07 NOTE — Telephone Encounter (Signed)
Patient came into office and wanted to follow up on a referral for her gastroenterology doctor. Please give pt a call regarding this matter.

## 2019-09-11 ENCOUNTER — Encounter: Payer: Self-pay | Admitting: Family Medicine

## 2019-09-11 ENCOUNTER — Other Ambulatory Visit: Payer: Self-pay

## 2019-09-11 ENCOUNTER — Ambulatory Visit (INDEPENDENT_AMBULATORY_CARE_PROVIDER_SITE_OTHER): Payer: Medicare HMO | Admitting: Family Medicine

## 2019-09-11 VITALS — BP 132/62 | HR 96 | Ht 60.0 in | Wt 290.0 lb

## 2019-09-11 DIAGNOSIS — R040 Epistaxis: Secondary | ICD-10-CM

## 2019-09-11 DIAGNOSIS — E119 Type 2 diabetes mellitus without complications: Secondary | ICD-10-CM | POA: Diagnosis not present

## 2019-09-11 MED ORDER — CANAGLIFLOZIN 100 MG PO TABS: 100.0000 mg | ORAL_TABLET | Freq: Every day | ORAL | 11 refills | Status: DC

## 2019-09-11 NOTE — Patient Instructions (Addendum)
Thank you for coming in to see Elizabeth Diaz today! Please see below to review our plan for today's visit:  1. Take Canagliflozin to better control your diabetes. Take the metformin 2 tablets each morning and 2 tablets at night. 2. I have provided you with information below regarding plant-based diet to reduce caloric intake and help reduce weight. 3. Try Nasal Saline gel for moisturizing your nose. You can also try saline nasal sprays. Try QR Nasal Powder for Nose bleeds. 4. I have referred you to an [facility] for further evaluation.  5. We will see you in January 2021  Please call the clinic at [phone] if your symptoms worsen or you have any concerns. It was our pleasure to serve you!  Dr. Milus Banister [facility]    Vegetarian Eating Information Many people may prefer vegetarian eating for religious, environmental, or personal reasons. These diets are often lower in calories, salt, sugar, cholesterol, and saturated and trans fats. Vegetarian eating provides significant health benefits. People who eat a vegetarian diet often have lower rates of:  Obesity.  Diabetes.  Breast and colon cancers.  Cardiovascular and gallbladder diseases. What are the types of vegetarian eating?  Vegetarian eating includes dietary choices that focus on eating mostly vegetables and fruit, grains, beans, nuts, and seeds. There are several different types of vegetarian eating. Talk with a diet and nutrition specialist (dietitian) about what type of vegetarian diet is best for you. Lacto-ovo vegetarian  Recommended foods: fruits and vegetables, milk and dairy, eggs, grains, soy and vegetable protein, beans, nuts, and seeds.  Foods to avoid: meat, poultry, seafood, animal-based broths and gravies, and gelatin. Lacto-vegetarian  Recommended foods: fruits and vegetables, milk and dairy, grains, soy and vegetable protein, beans, nuts, and seeds.  Foods to avoid: meat, poultry,  seafood, animal-based broths and gravies, gelatin, and eggs. Vegan  Recommended foods: fruits and vegetables, grains, soy and vegetable protein, beans, nuts, and seeds.  Foods to avoid: meat, poultry, seafood, animal-based broths and gravies, gelatin, eggs, milk and dairy, and honey. What do I need to know about vegetarian eating? All vegetarian diets restrict proteins that come from animals. Foods that come from animals have important nutrients, such as protein, fats, vitamins, and minerals. It is important to get these nutrients from other types of foods. If you think you may not be getting the right nutrients, or if you do not eat any animal products, talk with your health care provider or dietitian about taking supplements. A dietitian can help determine your individual nutrient needs. What are tips for following this plan? Eat a diet that includes a variety of fruits, vegetables, whole grains, and protein sources. This is important to make sure you get enough of the following nutrients: Protein Healthy protein sources include:  Eggs, milk, and cheese. Soy products. Tofu, tempeh, and textured vegetable protein (TVP). Quinoa. Hemp seeds. Other protein sources include:  Beans, such as black beans or kidney beans. Other legumes, such as lentils and split peas. Nuts, such as almonds, [place] nuts, and pecans. Seeds, such as sunflower seeds. To get the most benefit from plant-based proteins, combine two or more sources of plant protein with whole grains in one dish. Examples include beans and rice, almond butter on bread, or sunflower seeds on noodles. Vitamin B12 Sources of vitamin B12 include:  Cheese and eggs. Breakfast cereals and other prepared products that have vitamin B12 added (fortified products). If you eat a vegan diet, ask your health care provider  or dietitian about taking a B12 supplement. Vitamin D Good sources of vitamin D include:  Egg yolks. Fortified dairy products.  Fortified orange juice. Mushrooms. Cereals with added vitamin D. Another way of getting vitamin D is to spend 10 minutes each day in the sun. This helps your body make its own vitamin D. Depending on your age, you may need to take a vitamin D supplement. Talk with your health care provider or dietitian about how much vitamin D you need in a supplement. Iron Healthy sources of iron include:  Dark, leafy greens. Nuts. Beans. Grain products that are fortified with iron, such as cereals. Tofu, tempeh, soybeans, and quinoa. To get the most iron from plant-based foods:  Eat iron-containing plant-based foods with vitamin C. For example, squeeze fresh lemon juice over cooked greens like kale, chard, or spinach, or have a glass of orange juice with your meals.  Avoid eating dairy products, coffee, or tea with iron-containing foods. Omega-3 fatty acids Good sources of omega-3 fatty acids include:  Walnuts. Flax seeds, canola oil, soybean oil, and tofu. Avocados. Olives and olive oil. Foods with added omega-3 fatty acids, such as eggs, milk, and juices. Calcium Good sources of calcium include:  Dairy products. Fortified non-dairy milk. Fortified tofu. Dark, leafy greens, such as kale, bok choy, Chinese cabbage, collard greens and mustard greens. Broccoli. Okra. Fortified breakfast cereals and fruit juices. Figs. Zinc Good sources of zinc include:  Pumpkin seeds. Legumes, such as chickpeas, kidney beans, and green peas. Wheat germ, whole grains, and fortified cereals. Mushrooms. Spinach and kale. Milk and dairy foods. Dark chocolate. Summary  Vegetarian eating is a choice made by people who prefer vegetarian eating for religious, environmental, or personal reasons. These diets can provide significant health benefits.  There are several types of vegetarian diets, but all restrict proteins that come from animals.  It is important to make sure that you are getting enough nutrients, including protein,  vitamin B12, vitamin D, iron, omega-3 fatty acids, calcium, and zinc from your diet.  If you think you are not getting the right nutrients or if you do not eat any animal products, talk with your health care provider or dietitian.

## 2019-09-11 NOTE — Progress Notes (Signed)
   Subjective:    Patient ID: Elizabeth Diaz, female    DOB: 1966/07/10, 53 y.o.   MRN: [id number]   CC: f/u weight, nose bleeds  HPI: Nose bleeds: patient has a history of nose bleeds. Now she is having nosebleeds almost daily. She will blow her nose a little and then "the fountain turns on". Last night was coming out of both nostrils. She reports she bled last night for 45 minutes. When she has nosebleeds she packs her nose with tissue, pinches her nose, leans head forward a little to help stop the bleeding. She will also spray with Afrin to help stop the bleed. She holds this for about 10 minutes. She will release the pressure and see if it's still bleeding. She is taking Aspirin 81mg , she is apparently on Plavix for PAD but this is not listed on her med list. Would like a referral to ENT to get them under control. Her Hgb has been stable, PLT count normal.  Weight loss/Diabetes: patient has lost 4 lbs since last visit 1 month ago (294 lbs down to 290 lbs today). Patient is also diabetic, last A1c had increased from 7.1% up to 8.1%. Has been on 1000mg  Metformin BID. She would benefit from additional medication to reduce A1c and better control blood sugar, as well as additional weight loss.   Smoking status reviewed - rarely smokes Black 'n' Milds  Review of Systems - see HPI   Objective:  BP 132/62   Pulse 96   Ht 5' (1.524 m)   Wt 290 lb (131.5 kg)   SpO2 98%   BMI 56.64 kg/m  Vitals and nursing note reviewed  PHYSICAL EXAM General: well nourished, in no acute distress Cardiac: RRR, clear S1 and S2, no murmurs or gallops appreciated Respiratory: distant breath sounds secondary to habitus, CTA bilaterally, normal work of breathing Abdomen: normal bowel sounds appreciated Extremities: no edema appreciated, no deformity Skin: warm and dry, no rashes noted  Assessment & Plan:   Type 2 diabetes mellitus without complication, without long-term current use of insulin ([place]) Patient  on Metformin 1000mg  BID. -Adding Canagliflozin 100mg  daily -Recheck A1c in Feb 2020 -Patient provided with information regarding plant-based, low carb diet.  Epistaxis Patient on Aspirin 81 and apparently on Plavix for PAD (although not on medication list). Could be contributing to nose bleeds. -Recommend to keep using Ocean Spray Nasal mist -Could also use Nasal saline gels and QR Powders for nosebleeds (both can be purchased OTC) -Patient re-educated on proper technique - told not to place any object, including tissues into her nose, just apply pressure at the nasal bridge and hold pressure without interruption 10-15 minutes   Follow up: October 09, 2019 with PCP Dr. Martin[place] Shirley   Dr. Milus Banister [facility], PGY-2

## 2019-09-12 ENCOUNTER — Other Ambulatory Visit: Payer: Self-pay | Admitting: Family Medicine

## 2019-09-12 DIAGNOSIS — Z1231 Encounter for screening mammogram for malignant neoplasm of breast: Secondary | ICD-10-CM

## 2019-09-12 NOTE — Telephone Encounter (Signed)
Pt calling for referral to Breast center for [place] and diagnostic mammogram. Pt is having some nipple discharge and was told they won't do a regular mammogram. Pt can be reached at [place], CMA

## 2019-09-12 NOTE — Assessment & Plan Note (Addendum)
Patient on Metformin 1000mg  BID. -Adding Canagliflozin 100mg  daily -Recheck A1c in Feb 2020 -Patient provided with information regarding plant-based, low carb diet.

## 2019-09-12 NOTE — Assessment & Plan Note (Signed)
Patient on Aspirin 81 and apparently on Plavix for PAD (although not on medication list). Could be contributing to nose bleeds. -Recommend to keep using Ocean Spray Nasal mist -Could also use Nasal saline gels and QR Powders for nosebleeds (both can be purchased OTC) -Patient re-educated on proper technique - told not to place any object, including tissues into her nose, just apply pressure at the nasal bridge and hold pressure without interruption 10-15 minutes

## 2019-09-26 NOTE — Telephone Encounter (Signed)
Patient has an appointment on 10-09-19 for this concern.  Jaken Fregia,CMA

## 2019-10-03 DIAGNOSIS — R040 Epistaxis: Secondary | ICD-10-CM | POA: Diagnosis not present

## 2019-10-09 ENCOUNTER — Ambulatory Visit: Payer: Medicare HMO | Admitting: Family Medicine

## 2019-10-11 DIAGNOSIS — D497 Neoplasm of unspecified behavior of endocrine glands and other parts of nervous system: Secondary | ICD-10-CM | POA: Diagnosis not present

## 2019-10-11 DIAGNOSIS — Z87891 Personal history of nicotine dependence: Secondary | ICD-10-CM | POA: Diagnosis not present

## 2019-10-11 DIAGNOSIS — M4316 Spondylolisthesis, lumbar region: Secondary | ICD-10-CM | POA: Diagnosis not present

## 2019-10-11 DIAGNOSIS — M898X8 Other specified disorders of bone, other site: Secondary | ICD-10-CM | POA: Diagnosis not present

## 2019-10-11 DIAGNOSIS — Z79899 Other long term (current) drug therapy: Secondary | ICD-10-CM | POA: Diagnosis not present

## 2019-10-11 DIAGNOSIS — Z800 Family history of malignant neoplasm of digestive organs: Secondary | ICD-10-CM | POA: Diagnosis not present

## 2019-10-11 DIAGNOSIS — M47814 Spondylosis without myelopathy or radiculopathy, thoracic region: Secondary | ICD-10-CM | POA: Diagnosis not present

## 2019-10-11 DIAGNOSIS — M899 Disorder of bone, unspecified: Secondary | ICD-10-CM | POA: Diagnosis not present

## 2019-10-11 DIAGNOSIS — G8929 Other chronic pain: Secondary | ICD-10-CM | POA: Diagnosis not present

## 2019-10-13 ENCOUNTER — Ambulatory Visit: Payer: Medicare Other | Admitting: Family Medicine

## 2019-10-18 DIAGNOSIS — K580 Irritable bowel syndrome with diarrhea: Secondary | ICD-10-CM | POA: Diagnosis not present

## 2019-10-24 ENCOUNTER — Ambulatory Visit: Payer: Medicare Other | Admitting: Family Medicine

## 2019-10-24 NOTE — Progress Notes (Deleted)
   Subjective:    Patient ID: ALAIRA BOSTROM, female    DOB: Feb 25, 1966, 54 y.o.   MRN: [id number]   CC: Check Up  HPI: T2DM:  Taking Metformin XR 1000mg  BID. Taking Canagliflozin 100mg  daily, could increase to 300mg  daily. ***Also on Aspirin however no Hx of CVA or MI. Checking her Blood sugar *** times daily. Last A1c 8.1 on 08/11/2019 (increased from 7.1%), ophthalmologist ***, foot exam ***  Weight: patient's weight today ***  HTN: *** NKDA, would benefit from ACE or ARB. Taking Lasix 40mg  PRN edema.   COPD: Taking Albuterol Nebulizer q6 PRN Budesonide-Formoterol 2 puffs BID***  HLD: Patient taking atorvastatin 80 and Ezetimibe 10mg . No concerns with statin intolerance.  The 10-year ASCVD risk score Mikey Bussing DC Brooke Bonito., et al., 2013) is: 13.2%   Values used to calculate the score:     Age: 24 years     Sex: Female     Is Non-Hispanic African American: Yes     Diabetic: Yes     Tobacco smoker: No     Systolic Blood Pressure: [id number] mmHg     Is BP treated: Yes     HDL Cholesterol: 44 mg/dL     Total Cholesterol: 185 mg/dL  Colonoscopy: Up to date, last colonoscopy 11/11/2014 Mammogram: 10/03/2018, normal. Now patient is due for another screening mammogram. Flu shot: ***  Smoking status reviewed - occasionally smokes Black'n'milds, ***  Review of Systems - see HPI  Objective:  There were no vitals taken for this visit. Vitals and nursing note reviewed  PHYSICAL EXAM General: well nourished, in no acute distress HEENT: normocephalic, TM's visualized bilaterally, no scleral icterus or conjunctival pallor, no nasal discharge, moist mucous membranes, good dentition without erythema or discharge noted in posterior oropharynx Neck: supple, non-tender, without lymphadenopathy Cardiac: RRR, clear S1 and S2, no murmurs, rubs, or gallops Respiratory: clear to auscultation bilaterally, no increased work of breathing Abdomen: soft, nontender, nondistended, no masses or organomegaly. Bowel  sounds present Extremities: no edema or cyanosis. Warm, well perfused. 2+ radial and PT pulses bilaterally Skin: warm and dry, no rashes noted Neuro: alert and oriented, no focal deficits  Assessment & Plan:   No problem-specific Assessment & Plan notes found for this encounter.   No follow-ups on file.   Dr. Milus Banister [facility], PGY-2

## 2019-11-02 ENCOUNTER — Ambulatory Visit (INDEPENDENT_AMBULATORY_CARE_PROVIDER_SITE_OTHER): Payer: Medicare Other | Admitting: Family Medicine

## 2019-11-02 ENCOUNTER — Other Ambulatory Visit: Payer: Self-pay

## 2019-11-02 VITALS — BP 115/80 | HR 98 | Wt 285.8 lb

## 2019-11-02 DIAGNOSIS — E119 Type 2 diabetes mellitus without complications: Secondary | ICD-10-CM | POA: Diagnosis not present

## 2019-11-02 LAB — POCT GLYCOSYLATED HEMOGLOBIN (HGB A1C): HbA1c, POC (controlled diabetic range): 8.2 % — AB (ref 0.0–7.0)

## 2019-11-02 NOTE — Patient Instructions (Signed)
It was great seeing you today!   I'd like to see you back in one month for follow up  but if you need to be seen earlier than that for any new issues we're happy to fit you in, just give [place] a call!   If you have questions or concerns please do not hesitate to call at [phone].  Dr. Rushie [facility]

## 2019-11-02 NOTE — Progress Notes (Signed)
    Subjective:  Elizabeth Diaz is a 54 y.o. female who presents to the [facility] today with a chief complaint of follow-up on weight loss.   HPI:  Elizabeth Diaz here for diabetes follow up.   Diabetes Mellitus  Eats 2-3 meals a day and snacks between.  Has made some diet changes (see below). Denies missing any doses of DM medications and takes Metformin and Canagliflozin. Denies increased thirst, hunger, or frequent urination.  Has started to incorporate a more plant-based diet.     Obesity No candy, no chips.  Bakes veggies and meats. No sweet tea.  Thanks raw kumbucha and smoothies now.  Has not been eating late at night.  Reports being eats fresh fruit daily and has decreased red meat intake.  Limits microwaved foods.  States she cooks with fresh veggies. Wants to get more active. Walking more is her next step.  Apple cider vinegar detox as well.       ROS: See HPI     Objective:  Physical Exam: HR 98   BP 115/60   O2 sat: 99%    Weight 285.8lb    GEN: obese female in no acute distress  CV: regular rate and rhythm, no murmurs appreciated  RESP: no increased work of breathing, clear to ascultation bilaterally with no crackles, wheezes, or rhonchi  NEURO: moves all extremities appropriately PSYCH: Normal affect and thought content    No results found for this or any previous visit (from the past 72 hour(s)).   Assessment/Plan:  Type 2 diabetes mellitus without complication, without long-term current use of insulin (HCC) Home regimen includes: Metformin 1000 g twice daily, canagliflozin 100 mg daily.  A1c today 8.2 previously 8.1.  This patient was started on canagliflozin at last visit unsure why A1c is more elevated today.  Eye exam and microalbumin up-to-date.  In review, last lipid panel September 2019 was grossly within normal limits. -Consider switching to GLP-1 agonist (Ozempic, Victoza, Trulicity) which includes weight loss benefits -Eye exam and microalbumin  up-to-date -Patient needs diabetic foot assessment at next visit -Needs lipid panel at next visit  Morbid obesity [facility]) Patient wishes to begin exercising more.  Discussed speaking with dietitian however patient states she is spoken to dietitian in the past and feels that she does not need nutritional help at the present time.  Of note, patient's A1c more elevated today.  Patient seen at [facility] bariatric center.  Would like to have bariatric surgery.  States her goal weight is 200 pounds or less.  Weight down ~5 pounds since last visit in mid December.  -Continue to work on portion sizes -Revisit dietitian consult at next visit -Follow-up in 1 to 2 months with PCP    Orders Placed This Encounter  Procedures  . HgB A1c    No orders of the defined types were placed in this encounter.   Health Maintenance reviewed -patient needs diabetic foot exam.   Lyndee Hensen, DO PGY-1, [facility] 11/02/2019 11:55 AM

## 2019-11-07 ENCOUNTER — Encounter: Payer: Self-pay | Admitting: Family Medicine

## 2019-11-07 ENCOUNTER — Telehealth: Payer: Self-pay | Admitting: Family Medicine

## 2019-11-07 NOTE — Assessment & Plan Note (Addendum)
Patient wishes to begin exercising more.  Discussed speaking with dietitian however patient states she is spoken to dietitian in the past and feels that she does not need nutritional help at the present time.  Of note, patient's A1c more elevated today.  Patient seen at [facility] bariatric center.  Would like to have bariatric surgery.  States her goal weight is 200 pounds or less.  Weight down ~5 pounds since last visit in mid December.  -Continue to work on portion sizes -Revisit dietitian consult at next visit -Follow-up in 1 to 2 months with PCP

## 2019-11-07 NOTE — Telephone Encounter (Signed)
Left voicemail for patient to return call to discuss lab results.  A1c elevated at 8.2 from 8 1 previously.  Would like to discuss switching canagliflozin to GLP-1 agonist (Ozempic, Victoza, Trulicity).

## 2019-11-07 NOTE — Assessment & Plan Note (Signed)
Home regimen includes: Metformin 1000 g twice daily, canagliflozin 100 mg daily.  A1c today 8.2 previously 8.1.  This patient was started on canagliflozin at last visit unsure why A1c is more elevated today.  Eye exam and microalbumin up-to-date.  In review, last lipid panel September 2019 was grossly within normal limits. -Consider switching to GLP-1 agonist (Ozempic, Victoza, Trulicity) which includes weight loss benefits -Eye exam and microalbumin up-to-date -Patient needs diabetic foot assessment at next visit -Needs lipid panel at next visit

## 2019-11-08 NOTE — Telephone Encounter (Signed)
Will forward to Dr. Susa Simmonds.  Giuseppe Duchemin,CMA

## 2019-11-08 NOTE — Telephone Encounter (Signed)
Pt is returning Dr. Birdena Crandall phone call. She would like for her to call her back to discuss below.   The best call back number is [phone].

## 2019-11-08 NOTE — Telephone Encounter (Signed)
Spoke with patient and asked patient to schedule an appointment with PCP to better discuss switching to GLP-1 agonist (Victoza, Trulicity, Ozempic).  She is interested in the weight loss benefit as well as better diabetic control..  As these are typically injectables she will need to be taught how to use these.  Patient agrees to make an appointment to better discuss controlling her diabetes.

## 2019-11-29 ENCOUNTER — Ambulatory Visit: Payer: Medicare Other | Admitting: Family Medicine

## 2019-12-05 DIAGNOSIS — E119 Type 2 diabetes mellitus without complications: Secondary | ICD-10-CM | POA: Diagnosis not present

## 2019-12-05 LAB — HM DIABETES EYE EXAM

## 2019-12-07 DIAGNOSIS — I110 Hypertensive heart disease with heart failure: Secondary | ICD-10-CM | POA: Diagnosis not present

## 2019-12-07 DIAGNOSIS — I739 Peripheral vascular disease, unspecified: Secondary | ICD-10-CM | POA: Diagnosis not present

## 2019-12-07 DIAGNOSIS — E785 Hyperlipidemia, unspecified: Secondary | ICD-10-CM | POA: Diagnosis not present

## 2019-12-07 DIAGNOSIS — I5030 Unspecified diastolic (congestive) heart failure: Secondary | ICD-10-CM | POA: Diagnosis not present

## 2019-12-07 DIAGNOSIS — G4733 Obstructive sleep apnea (adult) (pediatric): Secondary | ICD-10-CM | POA: Diagnosis not present

## 2019-12-11 ENCOUNTER — Ambulatory Visit (INDEPENDENT_AMBULATORY_CARE_PROVIDER_SITE_OTHER): Payer: Medicare Other | Admitting: Family Medicine

## 2019-12-11 ENCOUNTER — Other Ambulatory Visit: Payer: Self-pay

## 2019-12-11 VITALS — BP 140/68 | HR 72 | Ht 60.0 in | Wt 281.4 lb

## 2019-12-11 DIAGNOSIS — R600 Localized edema: Secondary | ICD-10-CM | POA: Diagnosis not present

## 2019-12-11 DIAGNOSIS — E119 Type 2 diabetes mellitus without complications: Secondary | ICD-10-CM

## 2019-12-11 DIAGNOSIS — E782 Mixed hyperlipidemia: Secondary | ICD-10-CM

## 2019-12-11 DIAGNOSIS — Z1231 Encounter for screening mammogram for malignant neoplasm of breast: Secondary | ICD-10-CM | POA: Diagnosis not present

## 2019-12-11 MED ORDER — OZEMPIC (0.25 OR 0.5 MG/DOSE) 2 MG/1.5ML ~~LOC~~ SOPN: 0.2500 mg | PEN_INJECTOR | SUBCUTANEOUS | 1 refills | Status: DC

## 2019-12-11 MED ORDER — CYCLOBENZAPRINE HCL 5 MG PO TABS: 5.0000 mg | ORAL_TABLET | Freq: Three times a day (TID) | ORAL | 1 refills | Status: DC | PRN

## 2019-12-11 MED ORDER — FUROSEMIDE 40 MG PO TABS: ORAL_TABLET | ORAL | 0 refills | Status: DC

## 2019-12-11 NOTE — Patient Instructions (Addendum)
Thank you for coming to see me today. It was a pleasure! Today we talked about:   We will start you on Ozempic injectables as shown in office.  Please take this once weekly.  Start with 0.25mg  per injection.  This may also help with weight loss.  Congratulations on your current weight loss progress! Please continue taking your Metformin and Invokana as before.  Some of the most common side effects are nausea and upset stomach.  If you experience the side effects then please let me know.  I have refilled your Flexeril and furosemide. I will release your lab results on MyChart.  If there is any concerns then I will call to discuss.  Please schedule your yearly mammogram.  Please follow-up with me in 4-6 weeks or sooner as needed.  If you have any questions or concerns, please do not hesitate to call the office at [phone].  Take Care,   [place] Rawson Minix, DO

## 2019-12-11 NOTE — Progress Notes (Addendum)
   SUBJECTIVE:   CHIEF COMPLAINT / HPI:   Obesity: Patient has lost a few pounds since last visit and is excited. She has cut out certain drinks which she believes has helped. She is interested in the GLP-1 therapy to help lose weight and better control her A1c. Patient is still trying to have bariatric procedure and will be seeing [place] regularly for check ins.   Diabetes: Medications: canagliflozin and metformin Compliance: misses doses due to not wanting to take medications daily Hypoglycemic symptoms: no On Aspirin, and on statin Last eye exam: recently Last foot exam: perform at next visit ROS: denies dizziness, diaphoresis, LOC, polyuria, polydipsia  PERTINENT  PMH / PSH: HFpEF, PAD (01/2016 Atherectomy and DCB PTA of prox R. SFA, 10/2016 R. SFA PTA/stenting, 05/2018 PTA/stenting R. SFA), HTN, dyslipidemia,T2DM, GERD and morbid obesity  OBJECTIVE:  BP 140/68   Pulse 72   Ht 5' (1.524 m)   Wt 281 lb 6 oz (127.6 kg)   SpO2 99%   BMI 54.95 kg/m   General: NAD, pleasant Neck: Supple, no LAD Respiratory:  normal work of breathing Neuro: CN II-XII grossly intact Psych: AOx3, appropriate affect  ASSESSMENT/PLAN:   Morbid obesity ([place]) Has lost 13lbs since 07/2019. Patient still trying to have bariatric surgery and encouraged to continue weight loss to help with success of surgery. Will initiate GLP1 as seen under diabetes. Goal weight remains 200lb. Patient encouraged to exercise and continue diet changes.   Type 2 diabetes mellitus without complication, without long-term current use of insulin (HCC) Last A1c 8.2, and patient on canagliflozin and metformin. Initiated GLP-1 Ozempic for goal of better controlling diabetes as well as for weight loss. Patient demonstrated ability to use pen in clinic and took first dose.  -She will continue with ozempic 0.25mg  weekly and we may increase in 4-6 weeks if patient tolerates and does not have a lot of side effects -continue metformin and  canagliflozin  Mixed hyperlipidemia Patient's LDL 105 and not at goal. Will recommend she restart zetia and continue with statin therapy. Patient has some trouble taking her medications daily, but has hx of PAD with multiple stents. Encouraged weight loss as above.    Health Maintenance: Mammogram order today  [place] Nethra Mehlberg, DO PGY-3, [facility]

## 2019-12-12 DIAGNOSIS — G4733 Obstructive sleep apnea (adult) (pediatric): Secondary | ICD-10-CM | POA: Diagnosis not present

## 2019-12-12 LAB — COMPREHENSIVE METABOLIC PANEL
ALT: 36 IU/L — ABNORMAL HIGH (ref 0–32)
AST: 25 IU/L (ref 0–40)
Albumin/Globulin Ratio: 1.1 — ABNORMAL LOW (ref 1.2–2.2)
Albumin: 4 g/dL (ref 3.8–4.9)
Alkaline Phosphatase: 112 IU/L (ref 39–117)
BUN/Creatinine Ratio: 12 (ref 9–23)
BUN: 10 mg/dL (ref 6–24)
Bilirubin Total: 0.4 mg/dL (ref 0.0–1.2)
CO2: 26 mmol/L (ref 20–29)
Calcium: 9.9 mg/dL (ref 8.7–10.2)
Chloride: 100 mmol/L (ref 96–106)
Creatinine, Ser: 0.85 mg/dL (ref 0.57–1.00)
GFR calc Af Amer: 90 mL/min/{1.73_m2} (ref >59)
GFR calc non Af Amer: 78 mL/min/{1.73_m2} (ref >59)
Globulin, Total: 3.5 g/dL (ref 1.5–4.5)
Glucose: 129 mg/dL — ABNORMAL HIGH (ref 65–99)
Potassium: 4.5 mmol/L (ref 3.5–5.2)
Sodium: 140 mmol/L (ref 134–144)
Total Protein: 7.5 g/dL (ref 6.0–8.5)

## 2019-12-12 LAB — LIPID PANEL
Chol/HDL Ratio: 3.4 ratio (ref 0.0–4.4)
Cholesterol, Total: 182 mg/dL (ref 100–199)
HDL: 54 mg/dL (ref >39)
LDL Chol Calc (NIH): 105 mg/dL — ABNORMAL HIGH (ref 0–99)
Triglycerides: 131 mg/dL (ref 0–149)
VLDL Cholesterol Cal: 23 mg/dL (ref 5–40)

## 2019-12-14 NOTE — Assessment & Plan Note (Signed)
Patient's LDL 105 and not at goal. Will recommend she restart zetia and continue with statin therapy. Patient has some trouble taking her medications daily, but has hx of PAD with multiple stents. Encouraged weight loss as above.

## 2019-12-14 NOTE — Assessment & Plan Note (Addendum)
Has lost 13lbs since 07/2019. Patient still trying to have bariatric surgery and encouraged to continue weight loss to help with success of surgery. Will initiate GLP1 as seen under diabetes. Goal weight remains 200lb. Patient encouraged to exercise and continue diet changes.

## 2019-12-14 NOTE — Assessment & Plan Note (Signed)
Last A1c 8.2, and patient on canagliflozin and metformin. Initiated GLP-1 Ozempic for goal of better controlling diabetes as well as for weight loss. Patient demonstrated ability to use pen in clinic and took first dose.  -She will continue with ozempic 0.25mg  weekly and we may increase in 4-6 weeks if patient tolerates and does not have a lot of side effects -continue metformin and canagliflozin

## 2019-12-18 ENCOUNTER — Telehealth: Payer: Self-pay | Admitting: Family Medicine

## 2019-12-18 ENCOUNTER — Other Ambulatory Visit: Payer: Self-pay

## 2019-12-18 MED ORDER — ONDANSETRON 4 MG PO TBDP: 4.0000 mg | ORAL_TABLET | Freq: Three times a day (TID) | ORAL | 0 refills | Status: DC | PRN

## 2019-12-18 MED ORDER — MIRABEGRON ER 25 MG PO TB24: 25.0000 mg | ORAL_TABLET | Freq: Every day | ORAL | 1 refills | Status: DC

## 2019-12-18 NOTE — Telephone Encounter (Signed)
Given zofran; patient's nausea may improve with use of GLP-1 ozempic over time. If she is unable to tolerate the nausea and the nausea does not improve in the next few weeks, she should call/return to discuss stopping.

## 2019-12-18 NOTE — Telephone Encounter (Signed)
Patient calls nurse line to request nausea medication. Patient has been experiencing nausea since starting Ozempic. Patient states that she discussed with provider starting nausea medication.   To PCP  Please advise  Talbot Grumbling, RN

## 2019-12-20 ENCOUNTER — Ambulatory Visit: Payer: Medicare Other | Admitting: Family Medicine

## 2019-12-28 ENCOUNTER — Other Ambulatory Visit: Payer: Self-pay

## 2019-12-28 ENCOUNTER — Ambulatory Visit (INDEPENDENT_AMBULATORY_CARE_PROVIDER_SITE_OTHER): Payer: Medicare Other | Admitting: Family Medicine

## 2019-12-28 ENCOUNTER — Encounter: Payer: Self-pay | Admitting: Family Medicine

## 2019-12-28 ENCOUNTER — Other Ambulatory Visit (HOSPITAL_COMMUNITY)
Admission: RE | Admit: 2019-12-28 | Discharge: 2019-12-28 | Disposition: A | Discharge status: Home / Self Care | Payer: Medicare Other | Source: Ambulatory Visit | Attending: Family Medicine | Admitting: Family Medicine

## 2019-12-28 VITALS — BP 138/82 | HR 81 | Temp 97.6°F | Wt 276.0 lb

## 2019-12-28 DIAGNOSIS — Z1159 Encounter for screening for other viral diseases: Secondary | ICD-10-CM | POA: Diagnosis not present

## 2019-12-28 DIAGNOSIS — Z1151 Encounter for screening for human papillomavirus (HPV): Secondary | ICD-10-CM | POA: Insufficient documentation

## 2019-12-28 DIAGNOSIS — Z0000 Encounter for general adult medical examination without abnormal findings: Secondary | ICD-10-CM

## 2019-12-28 DIAGNOSIS — Z113 Encounter for screening for infections with a predominantly sexual mode of transmission: Secondary | ICD-10-CM

## 2019-12-28 DIAGNOSIS — Z124 Encounter for screening for malignant neoplasm of cervix: Secondary | ICD-10-CM

## 2019-12-28 NOTE — Patient Instructions (Signed)
Thank you for coming to see me today. It was a pleasure! Today we talked about:   I will work on your letter of necessity for bariatric surgery. Please let us know the name of the person to fax it to and her fax number.   Please follow-up with me in 4 weeks or sooner as needed.  If you have any questions or concerns, please do not hesitate to call the office at [phone].  Take Care,   [place] Keifer Habib, DO

## 2019-12-29 LAB — RPR: RPR Ser Ql: NONREACTIVE

## 2019-12-29 LAB — HEPATITIS C ANTIBODY: Hep C Virus Ab: <0.1 s/co ratio (ref 0.0–0.9)

## 2019-12-29 LAB — HIV ANTIBODY (ROUTINE TESTING W REFLEX): HIV Screen 4th Generation wRfx: NONREACTIVE

## 2019-12-31 NOTE — Progress Notes (Signed)
   Subjective:  CC -- Annual Physical Pt reports she needs a letter of necessity for bariatric surgery  Cardiovascular: - Dx Hypertension: yes  - Dx Hyperlipidemia: yes  - Dx Obesity: yes  - Physical Activity: trying to walk for weight loss  - Diabetes: yes, recently started ozempic and is happy with weight loss, but does report some nausea after use, but zofran helps with the nausea  Cancer: Breast >> Mammogram: up to date Cervical/Endometrial >>  - Vaginal Bleeding: no - Pap Smear: performed today, last normal 2016 with negative HPV Patient would like STD screening today  PERTINENT  PMH / PSH: HFpEF, PAD (01/2016 Atherectomy and DCB PTA of prox R. SFA, 10/2016 R. SFA PTA/stenting, 05/2018 PTA/stenting R. SFA), HTN, dyslipidemia,T2DM, GERD and morbid obesity  OBJECTIVE:  BP 138/82   Pulse 81   Temp 97.6 F (36.4 C) (Oral)   Wt 276 lb (125.2 kg)   SpO2 98%   BMI 53.90 kg/m   General: NAD, pleasant Neck: Supple, no LAD Respiratory: normal work of breathing GU/GYN: External genitalia within normal limits.  Vaginal mucosa pink, moist, normal rugae.  Nonfriable cervix without lesions, no discharge or bleeding noted on speculum exam.  Bimanual exam revealed normal, nongravid uterus.  No cervical motion tenderness. No adnexal masses bilaterally. Exam performed in the presence of a chaperone. Psych: AOx3, appropriate affect  ASSESSMENT/PLAN:   Health Maintainence: Patient provided with letter for bariatric surgery as I do believe she would benefit. Will continue ozempic. Pap smear performed today and STI testing as well as one time Hep C screen.   [place] Fenix Ruppe, DO PGY-3, [facility]

## 2020-01-01 LAB — CYTOLOGY - PAP
Chlamydia: NEGATIVE
Comment: NEGATIVE
Comment: NEGATIVE
Comment: NEGATIVE
Comment: NORMAL
Diagnosis: NEGATIVE
High risk HPV: NEGATIVE
Neisseria Gonorrhea: NEGATIVE
Trichomonas: NEGATIVE

## 2020-01-01 NOTE — Telephone Encounter (Signed)
Attempted to call pt to see if she had a chance to get the fax number we need to fax off the letter for her bariatric surgery. No answer. LVM asking pt to call office with that fax number. Salvatore Marvel, CMA

## 2020-01-01 NOTE — Telephone Encounter (Signed)
Spoke with pt. I informed her that we would need the fax # to the person she wanted [place] to send it to. Pt stated that she was going to get that # today and call to the office to give [place] that #. Salvatore Marvel, CMA

## 2020-01-02 NOTE — Telephone Encounter (Signed)
Spoke with pt for the 3rd attempt. Seeing if pt was able to get the fax number from her insurance company. Pt stated that she would call them and get. And once received she would call the office and give it to [place]. Salvatore Marvel, CMA

## 2020-01-03 ENCOUNTER — Ambulatory Visit: Payer: Medicare Other

## 2020-01-04 ENCOUNTER — Ambulatory Visit: Payer: Medicare Other

## 2020-01-04 ENCOUNTER — Encounter: Payer: Self-pay | Admitting: Family Medicine

## 2020-01-16 ENCOUNTER — Telehealth: Payer: Self-pay | Admitting: Family Medicine

## 2020-01-16 NOTE — Telephone Encounter (Signed)
Will forward to Dr. Enid Derry.  There was a mention in the last office note that this information was needed.  Marlin Brys,CMA

## 2020-01-16 NOTE — Telephone Encounter (Signed)
Patient called and stated the fax # is [phone] to [place].  Please call the insurance company at ph # for [phone]

## 2020-01-18 NOTE — Telephone Encounter (Signed)
Letter placed in fax pile.

## 2020-01-25 ENCOUNTER — Encounter: Payer: Self-pay | Admitting: Family Medicine

## 2020-01-25 ENCOUNTER — Ambulatory Visit (INDEPENDENT_AMBULATORY_CARE_PROVIDER_SITE_OTHER): Payer: Medicare Other | Admitting: Family Medicine

## 2020-01-25 ENCOUNTER — Other Ambulatory Visit: Payer: Self-pay

## 2020-01-25 ENCOUNTER — Ambulatory Visit: Payer: Self-pay | Admitting: Licensed Clinical Social Worker

## 2020-01-25 VITALS — BP 132/70 | HR 98 | Wt 268.0 lb

## 2020-01-25 DIAGNOSIS — E119 Type 2 diabetes mellitus without complications: Secondary | ICD-10-CM

## 2020-01-25 DIAGNOSIS — R8290 Unspecified abnormal findings in urine: Secondary | ICD-10-CM

## 2020-01-25 DIAGNOSIS — N3000 Acute cystitis without hematuria: Secondary | ICD-10-CM | POA: Diagnosis not present

## 2020-01-25 LAB — POCT URINALYSIS DIP (MANUAL ENTRY)
Bilirubin, UA: NEGATIVE
Glucose, UA: 100 mg/dL — AB
Ketones, POC UA: NEGATIVE mg/dL
Nitrite, UA: POSITIVE — AB
Protein Ur, POC: 30 mg/dL — AB
Spec Grav, UA: 1.025 (ref 1.010–1.025)
Urobilinogen, UA: 0.2 E.U./dL
pH, UA: 5.5 (ref 5.0–8.0)

## 2020-01-25 LAB — POCT UA - MICROSCOPIC ONLY

## 2020-01-25 LAB — POCT UA - MICROALBUMIN
Creatinine, POC: 200 mg/dL
Microalbumin Ur, POC: 150 mg/L

## 2020-01-25 MED ORDER — CEPHALEXIN 500 MG PO CAPS
500.0000 mg | ORAL_CAPSULE | Freq: Four times a day (QID) | ORAL | 0 refills | Status: AC
Start: 2020-01-25 — End: 2020-02-01

## 2020-01-25 NOTE — Patient Instructions (Signed)
Thank you for coming to see me today. It was a pleasure! Today we talked about:   Looks like you have a urinary tract infection today.  I will give you Keflex to use 4 times daily for 7 days.  We have sent off a urine culture.  If this does not cover the bacteria growing in the urine and I will call you to change the antibiotic.  Otherwise continue taking the Keflex for the duration of treatment.  I have given you some coupons for Ensure max for weight loss and replacing meals Ensure has been shown to be better than boost.  You may continue to use boost until you have run out but after that suggest switching.   Please follow-up with me in 6 weeks or sooner as needed.  If you have any questions or concerns, please do not hesitate to call the office at [phone].  Take Care,   [place] Diasia Henken, DO

## 2020-01-25 NOTE — Progress Notes (Signed)
   SUBJECTIVE:   CHIEF COMPLAINT / HPI:   Urinary odor: Patient denies any pain with urination but reports that 2 weeks ago she noticed a foul odor when she urinates.  She denies any increased frequency.  She reports that she has had some low back pain for the past few days.  Patient with a history of incontinence and has not noticed any worsening.  She denies any blood in her urine. Patient reports no known exposure to an STI.   ROS: Patient reports no CVA tenderness, fevers, vaginal discharge.  Obesity Patient down to 268 pounds from 276 pounds on 4/1.  She reports that she has been really trying to eat less and has been replacing meals with boost.  She states she has been making a lot of vegetable smoothies.  She reports that she is also continue to use the Ozempic and believes that it is helping her.  She denies any adverse side effects.  She is excited to be able to be considered for bariatric surgery.  PERTINENT  PMH / PSH: HFpEF, PAD (01/2016 Atherectomy and DCB PTA of prox R. SFA, 10/2016 R. SFA PTA/stenting, 05/2018 PTA/stenting R. SFA), HTN, dyslipidemia,T2DM, GERD and morbid obesity  OBJECTIVE:  BP 132/70   Pulse 98   Wt 268 lb (121.6 kg)   SpO2 99%   BMI 52.34 kg/m   General: NAD, pleasant Neck: Supple Respiratory: normal work of breathing Psych: AOx3, appropriate affect No CVA tenderness  ASSESSMENT/PLAN:   UTI (urinary tract infection) Patient reporting symptoms consistent with UTI (increased frequency, odor). UA positive for nitrites, leukocytes, and small blood. No CVA tenderness or fevers, so less concern for pyelonephritis. Plan for 7 day course of Keflex as patient found improvement with this in the past. Strict return precautions given. Follow up in 2 weeks if no improvement.  -Keflex 500mg  4 times daily x7days -F/u if no improvement   Morbid obesity ([place]) Patient with continued weight loss with GLP-1 lifestyle changes.  Congratulated patient on her hard work and  improvement and will continue with current plan.   [place] Edessa Jakubowicz, DO PGY-3, [facility]

## 2020-01-25 NOTE — Chronic Care Management (AMB) (Signed)
   Social Work  Care Management Collaboration 01/25/2020 Name: Elizabeth Diaz MRN: [id number] DOB: 1966/07/08  [name] is a 54 y.o. year old female who sees Enid Derry, [place], [place] for primary care. LCSW was consulted by PCP to assistance patient with Nutrition supplements .   Recommendation: After collaboration with PCP it is determined that patient may benefit from Ensure samples   Intervention: LCSW provided patient with Ensure samples and coupons during office visit with PCP.  Review of patient status, including review of consultants reports, relevant laboratory and other test results, and collaboration with appropriate care team members and the patient's provider was performed as part of comprehensive patient evaluation and provision of chronic care management services.    Plan:  1. No further follow up required by LCSW at this time 2. Please place formal CCM referral if additional support or interventions are needed   Casimer Lanius, [place] [facility] [place]   [phone] 2:54 PM

## 2020-01-29 DIAGNOSIS — N390 Urinary tract infection, site not specified: Secondary | ICD-10-CM

## 2020-01-29 HISTORY — DX: Urinary tract infection, site not specified: N39.0

## 2020-01-29 MED ORDER — ONETOUCH VERIO W/DEVICE KIT: 1.0000 [IU] | PACK | Freq: Once | 0 refills | Status: AC

## 2020-01-29 MED ORDER — ONETOUCH DELICA LANCETS 33G MISC: 1.0000 [IU] | Freq: Every day | 2 refills | Status: AC

## 2020-01-29 MED ORDER — ONETOUCH DELICA LANCING DEV MISC: 1.0000 [IU] | Freq: Every day | 2 refills | Status: DC

## 2020-01-29 MED ORDER — ONETOUCH VERIO VI STRP: ORAL_STRIP | 12 refills | Status: AC

## 2020-01-29 NOTE — Assessment & Plan Note (Signed)
Patient with continued weight loss with GLP-1 lifestyle changes.  Congratulated patient on her hard work and improvement and will continue with current plan.

## 2020-01-29 NOTE — Assessment & Plan Note (Signed)
Patient reporting symptoms consistent with UTI (increased frequency, odor). UA positive for nitrites, leukocytes, and small blood. No CVA tenderness or fevers, so less concern for pyelonephritis. Plan for 7 day course of Keflex as patient found improvement with this in the past. Strict return precautions given. Follow up in 2 weeks if no improvement.  -Keflex 500mg  4 times daily x7days -F/u if no improvement

## 2020-02-01 ENCOUNTER — Other Ambulatory Visit: Payer: Self-pay | Admitting: *Deleted

## 2020-02-01 DIAGNOSIS — E119 Type 2 diabetes mellitus without complications: Secondary | ICD-10-CM

## 2020-02-01 MED ORDER — ONETOUCH VERIO FLEX SYSTEM W/DEVICE KIT: PACK | 0 refills | Status: AC

## 2020-02-01 MED ORDER — ONETOUCH VERIO FLEX SYSTEM W/DEVICE KIT: PACK | 0 refills | Status: DC

## 2020-02-01 NOTE — Telephone Encounter (Signed)
Pharmacy sent a fax stating that one touch verio has been discontinued and patient will need a new script for the one touch verio flex.  New script sent to pharmacy. Oliver Heitzenrater,CMA

## 2020-02-15 ENCOUNTER — Ambulatory Visit: Payer: Medicare Other | Attending: Internal Medicine

## 2020-02-20 ENCOUNTER — Other Ambulatory Visit: Payer: Self-pay

## 2020-02-20 MED ORDER — OZEMPIC (0.25 OR 0.5 MG/DOSE) 2 MG/1.5ML ~~LOC~~ SOPN: 0.2500 mg | PEN_INJECTOR | SUBCUTANEOUS | 1 refills | Status: DC

## 2020-02-21 ENCOUNTER — Other Ambulatory Visit: Payer: Self-pay | Admitting: Family Medicine

## 2020-02-21 DIAGNOSIS — R600 Localized edema: Secondary | ICD-10-CM

## 2020-02-29 ENCOUNTER — Telehealth: Payer: Self-pay | Admitting: Family Medicine

## 2020-02-29 NOTE — Telephone Encounter (Signed)
Patient dropped of Placard to be filled out by the doctor. Patients last DOS: 01/25/2020, She would like to pick it up when its completed, patients number is [phone]. Thanks

## 2020-02-29 NOTE — Telephone Encounter (Signed)
Clinical info completed on handicap form.  Place form in Dr. Bonnita Levan box for completion.  Tarnisha Kachmar, CMA

## 2020-03-13 NOTE — Telephone Encounter (Signed)
Attempted to call patient in order to inform her that she does not qualify for handicap placard without answer.  Patient with history of peripheral arterial disease which would encourage her walking.  Patient checked to the boxes stating that she cannot walk 200 feet however did deliver this to clinic without use of any assistance.  Patient also has never come to clinic using a cane or other device in order to aid in her walking.  Therefore she does not meet qualifications for a handicap placard.  She should call to discuss further if there are any questions.

## 2020-03-15 NOTE — Telephone Encounter (Signed)
Patient calls nurse line returning PCP phone call. I advised patient she does not meet the criteria for a handicap placard. Patient upset and wants to schedule an apt with PCP to discuss further in detail. Patient scheduled for 6/21.

## 2020-03-18 ENCOUNTER — Encounter: Payer: Self-pay | Admitting: Family Medicine

## 2020-03-18 ENCOUNTER — Ambulatory Visit (INDEPENDENT_AMBULATORY_CARE_PROVIDER_SITE_OTHER): Payer: Medicare Other | Admitting: Family Medicine

## 2020-03-18 ENCOUNTER — Other Ambulatory Visit: Payer: Self-pay

## 2020-03-18 VITALS — BP 122/70 | HR 84 | Ht 60.0 in | Wt 267.0 lb

## 2020-03-18 DIAGNOSIS — E119 Type 2 diabetes mellitus without complications: Secondary | ICD-10-CM | POA: Diagnosis not present

## 2020-03-18 LAB — POCT GLYCOSYLATED HEMOGLOBIN (HGB A1C): HbA1c, POC (controlled diabetic range): 6.6 % (ref 0.0–7.0)

## 2020-03-18 MED ORDER — ATORVASTATIN CALCIUM 80 MG PO TABS: 80.0000 mg | ORAL_TABLET | Freq: Every day | ORAL | 3 refills | Status: DC

## 2020-03-18 NOTE — Patient Instructions (Addendum)
Thank you for coming to see me today. It was a pleasure! Today we talked about:   Keep up the good work! Continue the current medications.  Please follow-up with our clinic in 6 weeks or sooner as needed.  If you have any questions or concerns, please do not hesitate to call the office at [phone].  Take Care,   [place] Alexandre Faries, DO

## 2020-03-18 NOTE — Progress Notes (Signed)
° °  SUBJECTIVE:   CHIEF COMPLAINT / HPI:   Handicap placard: Patient requesting temporary handicap placard for the days that her knees are at her worst. She states that she will need to use a cane in order to help her ambulate at that time. Patient knows that she has a history of PAD and is currently trying to lose weight. She states she only uses the placard when she has to use her cane and understands the importance of getting plenty of exercise in order to help with her PAD and weight loss.  Diabetes: Blood Sugar ranges-does not check Medications: ozempic, metformin 2 pills once a day, invokana Compliance: yes Hypoglycemic symptoms: no On Aspirin, and on statin Last eye exam:  Encourage patient to obtain Last foot exam:  To have performed at next appointment ROS: denies dizziness, diaphoresis, LOC, polyuria, polydipsia   PERTINENT  PMH / PSH: T2DM, obesity, COPD, HTN, GERD, OA  OBJECTIVE:  BP 122/70    Pulse 84    Ht 5' (1.524 m)    Wt 267 lb (121.1 kg)    SpO2 97%    BMI 52.14 kg/m   General: NAD, pleasant Neck: Supple Respiratory: normal work of breathing Psych: AOx3, appropriate affect  ASSESSMENT/PLAN:   Type 2 diabetes mellitus without complication, without long-term current use of insulin (HCC) A1c improved to 6.6 with Ozempic, Invokana and Metformin. Patient reports compliance with these and is asking when she could come off of them. Encourage patient to continue them while she is seeking bariatric surgery as better control of her diabetes will help with the surgery and the healing afterwards. Patient voiced understanding of plan and is happy with her better A1c.  Patient provided with temporary handicap placard for 6 months that she is to only use when she is using a cane in order to comply with the rules stated by the form.  Elizabeth Nashae Maudlin, DO PGY-3, [facility]

## 2020-03-21 NOTE — Assessment & Plan Note (Signed)
A1c improved to 6.6 with Ozempic, Invokana and Metformin. Patient reports compliance with these and is asking when she could come off of them. Encourage patient to continue them while she is seeking bariatric surgery as better control of her diabetes will help with the surgery and the healing afterwards. Patient voiced understanding of plan and is happy with her better A1c.

## 2020-03-25 ENCOUNTER — Other Ambulatory Visit: Payer: Self-pay | Admitting: Family Medicine

## 2020-03-25 ENCOUNTER — Telehealth: Payer: Self-pay | Admitting: Family Medicine

## 2020-03-25 DIAGNOSIS — R600 Localized edema: Secondary | ICD-10-CM

## 2020-03-25 NOTE — Telephone Encounter (Signed)
Patient is calling and would like for Dr. Enid Derry to call her as soon as possible. She has made an appointment on Friday to have a form completed to allow her to donate plasma. Patient states that Dr. Enid Derry knows about the form and that it was sent to her previously. I asked when the form was faxed and she said  "months ago, maybe 9"  I explained she would have to see another doctor due to Dr. Enid Derry being completely booked and if it was that long ago, I have no way to confirm if we received the form through fax.  She asks that Dr. Enid Derry call her to discuss some things about the form.   The best call back number is [phone].

## 2020-03-25 NOTE — Telephone Encounter (Signed)
I looked into patient's chart and there was a release of information scanned on 08/15/2019 allowing [place] to receive information from [facility] but there is no current notes from them.  Will check with Md regarding form.  Alejandra Barna,CMA

## 2020-03-27 DIAGNOSIS — G4733 Obstructive sleep apnea (adult) (pediatric): Secondary | ICD-10-CM | POA: Diagnosis not present

## 2020-03-27 NOTE — Telephone Encounter (Signed)
I actually never discussed this with the patient and I am not sure what she is talking about. It seems that Dr. Grandville Silos and Dr. Lemmie Evens. Ouida Sills discussed this with the patient previously about 9 months ago. Given that Dr. Grandville Silos I are graduating I will forward this to Dr. Ouida Sills if there is something further that needs to be done.

## 2020-03-28 NOTE — Telephone Encounter (Signed)
Spoke with [facility] and they ask that we resend the release of information as they are unsure what happened with it.  Faxing copy over today [phone]) and will wait to hear back from them on labs performed.  Kielan Dreisbach,CMA

## 2020-03-29 ENCOUNTER — Ambulatory Visit: Payer: Medicare Other | Admitting: Family Medicine

## 2020-03-29 NOTE — Progress Notes (Deleted)
    SUBJECTIVE:   CHIEF COMPLAINT / HPI:   ***  PERTINENT  PMH / PSH: ***  OBJECTIVE:   There were no vitals taken for this visit.  ***  ASSESSMENT/PLAN:   No problem-specific Assessment & Plan notes found for this encounter.     Lyndee Hensen, [place]

## 2020-03-29 NOTE — Telephone Encounter (Signed)
Spoke with Elizabeth Diaz at [facility] and she states that patient had labs drawn prior to her last donation that checked her total protein analysis.  In the breakdown, they look at the beta and gamma results.  Patient's total protein was in range but her beta levels were off.  Patient will have these numbers when she comes in with her form from [place] explaining this.  They would like for [place] to get a repeat total protein that would include her beta to see if it resolved itself or there are steps that need to be taken before patient is able to donate plasma.  Patient was scheduled for today (03/29/20) to discuss this and provide the form, but no-showed her appointment.  Will wait until she brings form in to address.  Elizabeth Diaz,CMA

## 2020-04-25 DIAGNOSIS — Z79899 Other long term (current) drug therapy: Secondary | ICD-10-CM | POA: Diagnosis not present

## 2020-04-25 DIAGNOSIS — K219 Gastro-esophageal reflux disease without esophagitis: Secondary | ICD-10-CM | POA: Diagnosis not present

## 2020-04-25 DIAGNOSIS — Z713 Dietary counseling and surveillance: Secondary | ICD-10-CM | POA: Diagnosis not present

## 2020-04-25 DIAGNOSIS — I10 Essential (primary) hypertension: Secondary | ICD-10-CM | POA: Diagnosis not present

## 2020-04-25 DIAGNOSIS — Z8673 Personal history of transient ischemic attack (TIA), and cerebral infarction without residual deficits: Secondary | ICD-10-CM | POA: Diagnosis not present

## 2020-04-25 DIAGNOSIS — E1151 Type 2 diabetes mellitus with diabetic peripheral angiopathy without gangrene: Secondary | ICD-10-CM | POA: Diagnosis not present

## 2020-04-25 DIAGNOSIS — I70219 Atherosclerosis of native arteries of extremities with intermittent claudication, unspecified extremity: Secondary | ICD-10-CM | POA: Diagnosis not present

## 2020-04-25 DIAGNOSIS — E782 Mixed hyperlipidemia: Secondary | ICD-10-CM | POA: Diagnosis not present

## 2020-04-25 DIAGNOSIS — I110 Hypertensive heart disease with heart failure: Secondary | ICD-10-CM | POA: Diagnosis not present

## 2020-04-25 DIAGNOSIS — Z8639 Personal history of other endocrine, nutritional and metabolic disease: Secondary | ICD-10-CM | POA: Diagnosis not present

## 2020-04-25 DIAGNOSIS — Z9989 Dependence on other enabling machines and devices: Secondary | ICD-10-CM | POA: Diagnosis not present

## 2020-04-25 DIAGNOSIS — I5032 Chronic diastolic (congestive) heart failure: Secondary | ICD-10-CM | POA: Diagnosis not present

## 2020-04-25 DIAGNOSIS — Z87891 Personal history of nicotine dependence: Secondary | ICD-10-CM | POA: Diagnosis not present

## 2020-04-25 DIAGNOSIS — M170 Bilateral primary osteoarthritis of knee: Secondary | ICD-10-CM | POA: Diagnosis not present

## 2020-04-25 DIAGNOSIS — Z7984 Long term (current) use of oral hypoglycemic drugs: Secondary | ICD-10-CM | POA: Diagnosis not present

## 2020-04-25 DIAGNOSIS — Z7689 Persons encountering health services in other specified circumstances: Secondary | ICD-10-CM | POA: Diagnosis not present

## 2020-04-25 DIAGNOSIS — M5136 Other intervertebral disc degeneration, lumbar region: Secondary | ICD-10-CM | POA: Diagnosis not present

## 2020-04-25 DIAGNOSIS — K589 Irritable bowel syndrome without diarrhea: Secondary | ICD-10-CM | POA: Diagnosis not present

## 2020-04-25 DIAGNOSIS — Z87828 Personal history of other (healed) physical injury and trauma: Secondary | ICD-10-CM | POA: Diagnosis not present

## 2020-04-25 DIAGNOSIS — G4733 Obstructive sleep apnea (adult) (pediatric): Secondary | ICD-10-CM | POA: Diagnosis not present

## 2020-05-09 ENCOUNTER — Other Ambulatory Visit: Payer: Self-pay | Admitting: *Deleted

## 2020-05-09 ENCOUNTER — Encounter: Payer: Self-pay | Admitting: Family Medicine

## 2020-05-09 MED ORDER — OZEMPIC (0.25 OR 0.5 MG/DOSE) 2 MG/1.5ML ~~LOC~~ SOPN: 0.2500 mg | PEN_INJECTOR | SUBCUTANEOUS | 1 refills | Status: DC

## 2020-05-13 ENCOUNTER — Telehealth: Payer: Self-pay | Admitting: *Deleted

## 2020-05-13 NOTE — Telephone Encounter (Signed)
LVM to call office back to schedule an appointment per Dr. Jeani Hawking. Elizabeth Diaz, CMA

## 2020-05-13 NOTE — Telephone Encounter (Signed)
-----   Message from Ezequiel Essex, MD sent at 05/09/2020  1:07 PM EDT ----- Regarding: Call pt to make appt? Hello lovely ladies,   I've refilled Elizabeth Diaz's ozempic refill but she is due for a follow up appointment to check on her progress with ozempic and review her daily blood sugar logs. Would one of you please be able to call her to schedule an appointment? It's not a super urgent request, just whenever she's available. I just want an appointment on the books really. If her first availability the middle of Sept, it's no biggie and we can check her A1c then.   And as always, please let me know if this pool is not the right one for my request.   Thanks a bunch,  [employer]

## 2020-05-16 NOTE — Telephone Encounter (Signed)
LVM for pt to call the office for a follow up appt on the medication she was given. Ottis Stain, CMA

## 2020-05-21 ENCOUNTER — Ambulatory Visit: Payer: Medicare Other

## 2020-05-21 ENCOUNTER — Other Ambulatory Visit: Payer: Self-pay | Admitting: Family Medicine

## 2020-05-21 DIAGNOSIS — Z1231 Encounter for screening mammogram for malignant neoplasm of breast: Secondary | ICD-10-CM

## 2020-05-22 ENCOUNTER — Ambulatory Visit: Payer: Medicare Other

## 2020-05-22 NOTE — Telephone Encounter (Signed)
Pt has appointment scheduled on 06/12/2020. Harolyn Cocker Zimmerman Rumple, CMA

## 2020-05-29 ENCOUNTER — Ambulatory Visit: Payer: Medicare Other

## 2020-06-12 ENCOUNTER — Other Ambulatory Visit: Payer: Self-pay

## 2020-06-12 ENCOUNTER — Encounter: Payer: Self-pay | Admitting: Family Medicine

## 2020-06-12 ENCOUNTER — Ambulatory Visit (INDEPENDENT_AMBULATORY_CARE_PROVIDER_SITE_OTHER): Payer: Medicare Other | Admitting: Family Medicine

## 2020-06-12 VITALS — BP 120/68 | HR 90 | Wt 260.4 lb

## 2020-06-12 DIAGNOSIS — R3981 Functional urinary incontinence: Secondary | ICD-10-CM

## 2020-06-12 DIAGNOSIS — E119 Type 2 diabetes mellitus without complications: Secondary | ICD-10-CM

## 2020-06-12 DIAGNOSIS — F315 Bipolar disorder, current episode depressed, severe, with psychotic features: Secondary | ICD-10-CM

## 2020-06-12 DIAGNOSIS — G8929 Other chronic pain: Secondary | ICD-10-CM

## 2020-06-12 DIAGNOSIS — R600 Localized edema: Secondary | ICD-10-CM

## 2020-06-12 DIAGNOSIS — E782 Mixed hyperlipidemia: Secondary | ICD-10-CM

## 2020-06-12 DIAGNOSIS — M549 Dorsalgia, unspecified: Secondary | ICD-10-CM | POA: Diagnosis not present

## 2020-06-12 DIAGNOSIS — I5030 Unspecified diastolic (congestive) heart failure: Secondary | ICD-10-CM

## 2020-06-12 LAB — POCT GLYCOSYLATED HEMOGLOBIN (HGB A1C): HbA1c, POC (controlled diabetic range): 6.1 % (ref 0.0–7.0)

## 2020-06-12 MED ORDER — MIRABEGRON ER 25 MG PO TB24: 25.0000 mg | ORAL_TABLET | Freq: Every day | ORAL | 1 refills | Status: DC

## 2020-06-12 MED ORDER — CYCLOBENZAPRINE HCL 5 MG PO TABS: 5.0000 mg | ORAL_TABLET | Freq: Three times a day (TID) | ORAL | 1 refills | Status: DC | PRN

## 2020-06-12 MED ORDER — HYDROXYZINE HCL 25 MG PO TABS: 25.0000 mg | ORAL_TABLET | ORAL | 2 refills | Status: DC | PRN

## 2020-06-12 MED ORDER — CANAGLIFLOZIN 100 MG PO TABS: 100.0000 mg | ORAL_TABLET | Freq: Every day | ORAL | 11 refills | Status: DC

## 2020-06-12 MED ORDER — OZEMPIC (0.25 OR 0.5 MG/DOSE) 2 MG/1.5ML ~~LOC~~ SOPN: 0.5000 mg | PEN_INJECTOR | SUBCUTANEOUS | 1 refills | Status: DC

## 2020-06-12 MED ORDER — PREGABALIN 25 MG PO CAPS: 25.0000 mg | ORAL_CAPSULE | Freq: Two times a day (BID) | ORAL | 0 refills | Status: DC

## 2020-06-12 MED ORDER — FUROSEMIDE 40 MG PO TABS: ORAL_TABLET | ORAL | 0 refills | Status: DC

## 2020-06-12 MED ORDER — ATORVASTATIN CALCIUM 80 MG PO TABS: 80.0000 mg | ORAL_TABLET | Freq: Every day | ORAL | 3 refills | Status: DC

## 2020-06-12 NOTE — Assessment & Plan Note (Signed)
Refilled Myrbetriq today.  We will follow-up at next visit.

## 2020-06-12 NOTE — Patient Instructions (Addendum)
Thank you for allowing me to be part of your care.  It was very nice to meet you today.  Below is a list of things we talked about today:  Med refills Today I refilled 8 of your medications.  They'll be listed on your after visit summary stapled to this sheet.  You may pick them up at the [facility] on [address].  Meet your PCP Unfortunately we had limited time at this visit today.  Please make an appointment on your way out to meet with me again we will have more time to go over in detail your medical history and your current medications.  I would really appreciate the chance to get to know the whole you, both medical and personal.  [facility] lab work Please go to [place] to obtain an authorization form to allow [place] to obtain the records.  Once we have the records of the lab work that [place] [facility] found to be abnormal, we can determine how we will evaluate that.  Hemoglobin A1c Your hemoglobin A1c today was even better than last time.  It was 6.1 today.  Great job!  Continue your diabetes medicines as prescribed.  We will discuss your diabetes at your next appointment in more detail.  Of course, if you have any questions or concerns before your next appointment, please not hesitate to call our office or contact us via [facility].  Be well,  Ezequiel Essex, MD

## 2020-06-12 NOTE — Progress Notes (Signed)
SUBJECTIVE:   CHIEF COMPLAINT / HPI:   Unfortunately, we had limited time during this appointment.  We triaged the most important things to her and will save the rest for next time.  Lab work Used to donate with [facility] until they notified her that she needed certain lab work in order to return.  They run tests every 6 months and they found her to have increased albumin and increased something else.  She called [facility] while in the room with me and they reported they needed medical clearance before coming back to donate.  She will stop by [facility] and fill out an authorization for them to release her lab results to [place].  Med refills She needed 8 medication refills today: Atorvastatin, Invokana, Flexeril, Lasix, hydroxyzine, Myrbetriq, Lyrica, and Ozempic.  I sent these to her regular [facility] on [address].   Meet new PCP Due to time constraints in this visit, we did not have time to fully go over her complex medical history and current medications.  She was amenable to making another appointment for the near future during which we could meet and I could get to know her well.  PERTINENT  PMH / PSH: HFpEF, type 2 diabetes, GERD, COPD, OSA, HTN, PAD, bipolar affective disorder, mixed hyperlipidemia, morbid obesity, chronic back pain.  OBJECTIVE:   BP 120/68   Pulse 90   Wt 118.1 kg   SpO2 96%   BMI 50.86 kg/m     PHQ-9:  Depression screen [facility] 2/9 06/12/2020 03/18/2020 12/28/2019  Decreased Interest 1 1 0  Down, Depressed, Hopeless 0 0 0  PHQ - 2 Score 1 1 0  Altered sleeping 0 - -  Tired, decreased energy 1 - -  Change in appetite 0 - -  Feeling bad or failure about yourself  0 - -  Trouble concentrating 0 - -  Moving slowly or fidgety/restless 0 - -  Suicidal thoughts 0 - -  PHQ-9 Score 2 - -  Difficult doing work/chores - - -     GAD-7: No flowsheet data found.    Physical Exam General: Awake, alert, oriented Cardiovascular: Regular rate and  rhythm, S1 and S2 present, no murmurs auscultated Respiratory: Lung fields clear to auscultation bilaterally Extremities: No bilateral lower extremity edema, palpable pedal and pretibial pulses bilaterally Neuro: Cranial nerves II through X grossly intact, able to move all extremities spontaneously   ASSESSMENT/PLAN:   Mixed hyperlipidemia Refilled atorvastatin 80 mg today.  Patient reported she is no longer taking Zetia.  March 2021 lipid panel normal.  We will repeat lipid panel at next visit.  Type 2 diabetes mellitus without complication, without long-term current use of insulin (HCC) A1c measurement today 6.1, best measurement yet.  Patient reports she is only taking Ozempic 0.5 mg weekly.  No longer taking Metformin.  Also doing very well with weight loss.  Additionally, refilled Invokana, pregabalin, Ozempic.  Will address all medications and conditions at next visit.  Chronic back pain Refilled Flexeril.  Doing well with weight loss.  Will address Flexeril at next visit.  (HFpEF) heart failure with preserved ejection fraction (HCC) Refilled Lasix today.  Has follow-up with cardiologist tomorrow.  Urinary incontinence Refilled Myrbetriq today.  We will follow-up at next visit.  Morbid obesity ([place]) Currently seen bariatric clinic at [facility].  Wants to pursue bariatric surgery, going to cardiologist tomorrow for cardiac clearance.  Down to 260 pounds.     Ezequiel Essex, MD [place]  [place]

## 2020-06-12 NOTE — Assessment & Plan Note (Signed)
Refilled atorvastatin 80 mg today.  Patient reported she is no longer taking Zetia.  March 2021 lipid panel normal.  We will repeat lipid panel at next visit.

## 2020-06-12 NOTE — Assessment & Plan Note (Signed)
Refilled Lasix today.  Has follow-up with cardiologist tomorrow.

## 2020-06-12 NOTE — Assessment & Plan Note (Addendum)
A1c measurement today 6.1, best measurement yet.  Patient reports she is only taking Ozempic 0.5 mg weekly.  No longer taking Metformin.  Also doing very well with weight loss.  Additionally, refilled Invokana, pregabalin, Ozempic.  Will address all medications and conditions at next visit.

## 2020-06-12 NOTE — Assessment & Plan Note (Signed)
Currently seen bariatric clinic at [facility].  Wants to pursue bariatric surgery, going to cardiologist tomorrow for cardiac clearance.  Down to 260 pounds.

## 2020-06-12 NOTE — Assessment & Plan Note (Signed)
Refilled Flexeril.  Doing well with weight loss.  Will address Flexeril at next visit.

## 2020-06-13 DIAGNOSIS — I5030 Unspecified diastolic (congestive) heart failure: Secondary | ICD-10-CM | POA: Diagnosis not present

## 2020-06-13 DIAGNOSIS — R0600 Dyspnea, unspecified: Secondary | ICD-10-CM | POA: Diagnosis not present

## 2020-06-13 DIAGNOSIS — I110 Hypertensive heart disease with heart failure: Secondary | ICD-10-CM | POA: Diagnosis not present

## 2020-06-13 DIAGNOSIS — I70219 Atherosclerosis of native arteries of extremities with intermittent claudication, unspecified extremity: Secondary | ICD-10-CM | POA: Diagnosis not present

## 2020-06-13 DIAGNOSIS — E785 Hyperlipidemia, unspecified: Secondary | ICD-10-CM | POA: Diagnosis not present

## 2020-06-13 DIAGNOSIS — I739 Peripheral vascular disease, unspecified: Secondary | ICD-10-CM | POA: Diagnosis not present

## 2020-06-14 ENCOUNTER — Other Ambulatory Visit: Payer: Self-pay

## 2020-06-14 ENCOUNTER — Ambulatory Visit
Admission: RE | Admit: 2020-06-14 | Discharge: 2020-06-14 | Disposition: A | Discharge status: Home / Self Care | Payer: Medicare Other | Source: Ambulatory Visit | Attending: *Deleted | Admitting: *Deleted

## 2020-06-14 DIAGNOSIS — Z1231 Encounter for screening mammogram for malignant neoplasm of breast: Secondary | ICD-10-CM | POA: Diagnosis not present

## 2020-06-25 ENCOUNTER — Other Ambulatory Visit: Payer: Self-pay | Admitting: Family Medicine

## 2020-06-25 ENCOUNTER — Telehealth: Payer: Self-pay | Admitting: *Deleted

## 2020-06-25 ENCOUNTER — Telehealth: Payer: Self-pay

## 2020-06-25 DIAGNOSIS — E119 Type 2 diabetes mellitus without complications: Secondary | ICD-10-CM

## 2020-06-25 MED ORDER — OZEMPIC (0.25 OR 0.5 MG/DOSE) 2 MG/1.5ML ~~LOC~~ SOPN: 0.5000 mg | PEN_INJECTOR | SUBCUTANEOUS | 0 refills | Status: DC

## 2020-06-25 NOTE — Telephone Encounter (Signed)
Received fax from pharmacy, PA needed on [place]. Clinical questions submitted via Cover My Meds. Waiting on response, could take up to 72 hours.  Cover My Meds info: Key: [id number]   The patients plan prefers [place] or [place].

## 2020-06-25 NOTE — Telephone Encounter (Signed)
Received fax requesting to change to a 90 day supply on pts Ozempic.Mataeo Ingwersen Zimmerman Rumple, CMA

## 2020-06-25 NOTE — Telephone Encounter (Signed)
Medication was denied. The patients plans prefers [place] or [place]. If patient has failed both drugs please let me know.

## 2020-07-02 ENCOUNTER — Telehealth: Payer: Self-pay | Admitting: *Deleted

## 2020-07-02 NOTE — Telephone Encounter (Signed)
Pts Ozempic was set to print when filled, need to have it transmit to pharmacy if you did not fax it to pharmacy so pt can get this. Derril Franek Zimmerman Rumple, CMA

## 2020-07-05 ENCOUNTER — Encounter: Payer: Self-pay | Admitting: Family Medicine

## 2020-07-22 ENCOUNTER — Other Ambulatory Visit: Payer: Self-pay | Admitting: Family Medicine

## 2020-07-22 DIAGNOSIS — R600 Localized edema: Secondary | ICD-10-CM

## 2020-07-25 DIAGNOSIS — R509 Fever, unspecified: Secondary | ICD-10-CM | POA: Diagnosis not present

## 2020-07-25 DIAGNOSIS — Z1152 Encounter for screening for COVID-19: Secondary | ICD-10-CM | POA: Diagnosis not present

## 2020-08-06 ENCOUNTER — Telehealth: Payer: Self-pay | Admitting: Family Medicine

## 2020-08-06 NOTE — Telephone Encounter (Signed)
Called Elizabeth Diaz ahead of her appointment on 11/10 at 8:45am. I asked her to please bring all of her medications in tow so we can go over them together and clean up her med list. She agreed and had no questions for me.   Ezequiel Essex, MD

## 2020-08-07 ENCOUNTER — Other Ambulatory Visit: Payer: Self-pay

## 2020-08-07 ENCOUNTER — Other Ambulatory Visit (HOSPITAL_COMMUNITY)
Admission: RE | Admit: 2020-08-07 | Discharge: 2020-08-07 | Disposition: A | Discharge status: Home / Self Care | Payer: Medicare Other | Source: Ambulatory Visit | Attending: Family Medicine | Admitting: Family Medicine

## 2020-08-07 ENCOUNTER — Encounter: Payer: Self-pay | Admitting: Family Medicine

## 2020-08-07 ENCOUNTER — Ambulatory Visit (INDEPENDENT_AMBULATORY_CARE_PROVIDER_SITE_OTHER): Payer: Medicare Other | Admitting: Family Medicine

## 2020-08-07 VITALS — BP 130/60 | HR 75 | Ht 60.0 in | Wt 260.4 lb

## 2020-08-07 DIAGNOSIS — M25562 Pain in left knee: Secondary | ICD-10-CM

## 2020-08-07 DIAGNOSIS — F315 Bipolar disorder, current episode depressed, severe, with psychotic features: Secondary | ICD-10-CM | POA: Diagnosis not present

## 2020-08-07 DIAGNOSIS — M549 Dorsalgia, unspecified: Secondary | ICD-10-CM

## 2020-08-07 DIAGNOSIS — M25561 Pain in right knee: Secondary | ICD-10-CM

## 2020-08-07 DIAGNOSIS — Z9189 Other specified personal risk factors, not elsewhere classified: Secondary | ICD-10-CM | POA: Diagnosis not present

## 2020-08-07 DIAGNOSIS — R3981 Functional urinary incontinence: Secondary | ICD-10-CM

## 2020-08-07 DIAGNOSIS — Z113 Encounter for screening for infections with a predominantly sexual mode of transmission: Secondary | ICD-10-CM

## 2020-08-07 DIAGNOSIS — Z202 Contact with and (suspected) exposure to infections with a predominantly sexual mode of transmission: Secondary | ICD-10-CM

## 2020-08-07 DIAGNOSIS — E119 Type 2 diabetes mellitus without complications: Secondary | ICD-10-CM

## 2020-08-07 DIAGNOSIS — G8929 Other chronic pain: Secondary | ICD-10-CM

## 2020-08-07 DIAGNOSIS — Z1159 Encounter for screening for other viral diseases: Secondary | ICD-10-CM | POA: Diagnosis not present

## 2020-08-07 DIAGNOSIS — Z0000 Encounter for general adult medical examination without abnormal findings: Secondary | ICD-10-CM | POA: Diagnosis not present

## 2020-08-07 HISTORY — DX: Encounter for screening for infections with a predominantly sexual mode of transmission: Z11.3

## 2020-08-07 MED ORDER — METRONIDAZOLE 500 MG PO TABS: 2000.0000 mg | ORAL_TABLET | Freq: Once | ORAL | 0 refills | Status: AC

## 2020-08-07 MED ORDER — HYDROXYZINE HCL 25 MG PO TABS: 25.0000 mg | ORAL_TABLET | ORAL | 2 refills | Status: DC | PRN

## 2020-08-07 MED ORDER — GABAPENTIN 300 MG PO CAPS: 300.0000 mg | ORAL_CAPSULE | Freq: Two times a day (BID) | ORAL | 3 refills | Status: DC

## 2020-08-07 MED ORDER — CYCLOBENZAPRINE HCL 5 MG PO TABS: 5.0000 mg | ORAL_TABLET | Freq: Three times a day (TID) | ORAL | 1 refills | Status: DC | PRN

## 2020-08-07 NOTE — Assessment & Plan Note (Signed)
Elizabeth Diaz today requests STD testing "for all of it". Specifically concerned for recent trichomonas exposure. Previous partner notified her they had been diagnosed and treated. Will send script for metronidazole 2g once. Will also obtain urine sample for GC/chlamydia, trichomonas and blood sample for HIV , RPR.

## 2020-08-07 NOTE — Assessment & Plan Note (Signed)
Previously on myrbetriq but reports exacerbation of symptoms. Says she was previously on a medication that may have begun with "D" that helped more. Given that we were out of time, I advised her to make another appointment soon to discuss this together.

## 2020-08-07 NOTE — Assessment & Plan Note (Signed)
Today at the med rec visit, Elizabeth Diaz noted that she has not been taking the pregabalin because she does not believe it works as well as the gabapentin did previously. She requests to restart gabapentin. Will send script.

## 2020-08-07 NOTE — Progress Notes (Signed)
SUBJECTIVE:   CHIEF COMPLAINT / HPI:   Medication management Patient is here today for med reconciliation.  She was able to bring in all her medications in a bag to go over exactly what she is taking currently.  Gabapentin refill Patient reports that the previously prescribed Lyrica is not working as well as the gabapentin, and that she would like to go back on gabapentin for neuropathic pain.  Will send a refill to her pharmacy.  STD testing Requests STD testing today "for everything".  Today specifically she is concerned about trichomonas because she had a previous partner report to her that she was diagnosed and treated for trichomonas.  No symptoms such as dysuria, hematuria, genitourinary lesions, or vulvar pruritus.  [facility] donation At her last visit, she requested lab work to be done so that she may return to donating plasma at [facility].  After that last visit, she forgot to swing by [facility] and fill out an authorization form that would allow [place] to obtain the records. I expressed to her that without those records,  I would not know what to order and would run the risk of either ordering too much or missing a lab.  She agrees to return to [facility] and sign the authorization form to release records to [place] today.    Urinary retention She reports that she is still experiencing urinary retention and that the Myrbetriq previously prescribed her has not been working.  Given that we had already covered quite a bit and this med reconciliation visit, I encouraged her to make a separate appointment for the near future to follow-up on that and discussed starting alternate medication.   PERTINENT  PMH / PSH: HTN, COPD, HFpEF, type 2 diabetes, PAD, chronic back pain, bilateral chronic knee pain, urinary incontinence, IBS, OSA  OBJECTIVE:   BP 130/60   Pulse 75   Ht 5' (1.524 m)   Wt 260 lb 6 oz (118.1 kg)   SpO2 97%   BMI 50.85 kg/m    PHQ-9:  Depression screen [facility] 2/9 08/07/2020  06/12/2020 03/18/2020  Decreased Interest 0 1 1  Down, Depressed, Hopeless 0 0 0  PHQ - 2 Score 0 1 1  Altered sleeping 2 0 -  Tired, decreased energy 0 1 -  Change in appetite 0 0 -  Feeling bad or failure about yourself  0 0 -  Trouble concentrating 0 0 -  Moving slowly or fidgety/restless 0 0 -  Suicidal thoughts 0 0 -  PHQ-9 Score 2 2 -  Difficult doing work/chores Not difficult at all - -     GAD-7: No flowsheet data found.    Physical Exam General: Awake, alert, oriented Cardiovascular: Regular rate and rhythm, S1 and S2 present, no murmurs auscultated Respiratory: Lung fields clear to auscultation bilaterally Extremities: No bilateral lower extremity edema, palpable pedal and pretibial pulses bilaterally Neuro: Cranial nerves II through X grossly intact, able to move all extremities spontaneously   ASSESSMENT/PLAN:   Chronic back pain Today at the med rec visit, Mr. Dado noted that she has not been taking the pregabalin because she does not believe it works as well as the gabapentin did previously. She requests to restart gabapentin. Will send script.   Screen for sexually transmitted diseases Ms. Linskey today requests STD testing "for all of it". Specifically concerned for recent trichomonas exposure. Previous partner notified her they had been diagnosed and treated. Will send script for metronidazole 2g once. Will also  obtain urine sample for GC/chlamydia, trichomonas and blood sample for HIV , RPR.   Urinary incontinence Previously on myrbetriq but reports exacerbation of symptoms. Says she was previously on a medication that may have begun with "D" that helped more. Given that we were out of time, I advised her to make another appointment soon to discuss this together.       Ezequiel Essex, MD [place]

## 2020-08-07 NOTE — Patient Instructions (Signed)
It was wonderful to see you today. Thank you for allowing me to be a part of your care. Below is a short summary of what we discussed at your visit today:  Medication management Thank you so much for bringing all of your medicines in.  We were able to get your medicine list cleaned up very nicely but now we only have your active medications on the list.  Also I have switched your old Lyrica prescription to gabapentin because it works better for you.  The prescription has been sent to your pharmacy.  STD testing Today we obtained a urine sample and blood sample to test for gonorrhea, chlamydia, trichomonas, HIV, and syphilis.  Because you are specifically concerned about trichomonas and this is sometimes hard to pick up in the urine sample, I have gone ahead and send you a preventative antibiotic to take at home.  This antibiotic is called metronidazole and you will take 2 g (4 tablets) all at once.  That we will treat trichomonas if you currently have it, and ensure that you do not accidentally give it back to a previously treated partner.  [facility] Make sure to go visit [facility] and signed an authorization form telling them it is okay to release your records from [facility] to [place].  This is important because we need those records to determine what repeat labs you need to order.  Urinary problems I am sorry to hear that the Myrbetriq has not been working for you.  Please make sure to schedule a separate appointment to come back in and discuss this further.   If you have any questions or concerns, please do not hesitate to contact us via phone or MyChart message.   Ezequiel Essex, MD

## 2020-08-08 LAB — HIV ANTIBODY (ROUTINE TESTING W REFLEX): HIV Screen 4th Generation wRfx: NONREACTIVE

## 2020-08-08 LAB — URINE CYTOLOGY ANCILLARY ONLY
Chlamydia: NEGATIVE
Comment: NEGATIVE
Comment: NEGATIVE
Comment: NORMAL
Neisseria Gonorrhea: NEGATIVE
Trichomonas: NEGATIVE

## 2020-08-08 LAB — RPR: RPR Ser Ql: NONREACTIVE

## 2020-08-19 DIAGNOSIS — M5136 Other intervertebral disc degeneration, lumbar region: Secondary | ICD-10-CM | POA: Diagnosis not present

## 2020-08-19 DIAGNOSIS — M4316 Spondylolisthesis, lumbar region: Secondary | ICD-10-CM | POA: Diagnosis not present

## 2020-08-19 DIAGNOSIS — M5030 Other cervical disc degeneration, unspecified cervical region: Secondary | ICD-10-CM | POA: Diagnosis not present

## 2020-08-19 DIAGNOSIS — M546 Pain in thoracic spine: Secondary | ICD-10-CM | POA: Diagnosis not present

## 2020-08-19 DIAGNOSIS — M5134 Other intervertebral disc degeneration, thoracic region: Secondary | ICD-10-CM | POA: Diagnosis not present

## 2020-08-19 DIAGNOSIS — M40204 Unspecified kyphosis, thoracic region: Secondary | ICD-10-CM | POA: Diagnosis not present

## 2020-08-19 DIAGNOSIS — M5442 Lumbago with sciatica, left side: Secondary | ICD-10-CM | POA: Diagnosis not present

## 2020-08-19 DIAGNOSIS — M5441 Lumbago with sciatica, right side: Secondary | ICD-10-CM | POA: Diagnosis not present

## 2020-08-19 DIAGNOSIS — G8929 Other chronic pain: Secondary | ICD-10-CM | POA: Diagnosis not present

## 2020-08-19 DIAGNOSIS — M545 Low back pain, unspecified: Secondary | ICD-10-CM | POA: Diagnosis not present

## 2020-08-24 ENCOUNTER — Other Ambulatory Visit: Payer: Self-pay | Admitting: Family Medicine

## 2020-08-24 DIAGNOSIS — E119 Type 2 diabetes mellitus without complications: Secondary | ICD-10-CM

## 2020-08-30 DIAGNOSIS — K580 Irritable bowel syndrome with diarrhea: Secondary | ICD-10-CM | POA: Diagnosis not present

## 2020-09-03 LAB — HM DIABETES EYE EXAM

## 2020-09-13 ENCOUNTER — Encounter: Payer: Self-pay | Admitting: Family Medicine

## 2020-09-13 ENCOUNTER — Ambulatory Visit (INDEPENDENT_AMBULATORY_CARE_PROVIDER_SITE_OTHER): Payer: Medicare Other | Admitting: Family Medicine

## 2020-09-13 ENCOUNTER — Other Ambulatory Visit: Payer: Self-pay

## 2020-09-13 VITALS — BP 124/74 | HR 91 | Ht 60.0 in | Wt 257.0 lb

## 2020-09-13 DIAGNOSIS — Z23 Encounter for immunization: Secondary | ICD-10-CM | POA: Diagnosis not present

## 2020-09-13 DIAGNOSIS — M25561 Pain in right knee: Secondary | ICD-10-CM

## 2020-09-13 DIAGNOSIS — F315 Bipolar disorder, current episode depressed, severe, with psychotic features: Secondary | ICD-10-CM

## 2020-09-13 DIAGNOSIS — M549 Dorsalgia, unspecified: Secondary | ICD-10-CM | POA: Diagnosis not present

## 2020-09-13 DIAGNOSIS — E119 Type 2 diabetes mellitus without complications: Secondary | ICD-10-CM

## 2020-09-13 DIAGNOSIS — M25562 Pain in left knee: Secondary | ICD-10-CM

## 2020-09-13 DIAGNOSIS — G8929 Other chronic pain: Secondary | ICD-10-CM

## 2020-09-13 MED ORDER — GABAPENTIN 300 MG PO CAPS: 300.0000 mg | ORAL_CAPSULE | Freq: Two times a day (BID) | ORAL | 3 refills | Status: DC

## 2020-09-13 MED ORDER — CYCLOBENZAPRINE HCL 5 MG PO TABS
5.0000 mg | ORAL_TABLET | Freq: Three times a day (TID) | ORAL | 1 refills | Status: DC | PRN
Start: 2020-09-13 — End: 2020-12-04

## 2020-09-13 MED ORDER — OZEMPIC (0.25 OR 0.5 MG/DOSE) 2 MG/1.5ML ~~LOC~~ SOPN
0.5000 mg | PEN_INJECTOR | SUBCUTANEOUS | 0 refills | Status: DC
Start: 2020-09-13 — End: 2020-12-04

## 2020-09-13 MED ORDER — HYDROXYZINE HCL 25 MG PO TABS: 25.0000 mg | ORAL_TABLET | ORAL | 2 refills | Status: DC | PRN

## 2020-09-13 MED ORDER — DICLOFENAC SODIUM 1 % EX GEL: 2.0000 g | Freq: Every day | CUTANEOUS | Status: DC

## 2020-09-13 NOTE — Progress Notes (Signed)
SUBJECTIVE:   CHIEF COMPLAINT / HPI:   Knee pain Chronic bilateral knee pain, worse in the last several weeks.  Has tried Aspercreme, BenGay, and patches without relief.  Has seen physical therapy in the past for this.  She reports pain all day, but worse with walking.  Last knee x-rays on file in epic from 2016, showed joint space narrowing concerning for osteoarthritis.  She sees a pain doctor through [facility], next scheduled for 10/01/2020; she reports that through this pain doctor, she has tried dry needling, steroid injections, and other therapies without relief.  In past has used both rolling walker and cane, but now walks without them for the most part.  She is trying her best to walk without aids.  She is without cane here in the office.  Weight loss Currently working with bariatric clinic at [facility], hopeful for bariatric surgery.  Patient reports losing about 30 pounds but then gaining back 7 or 8 after Thanksgiving.  She says she tried to do well with her diet and "eating less".  I approached the subject of the healthy weight and wellness clinic with her, and she is interested.  I will place referral today.  Mental health provider Patient asks if I know any good mental health providers in town, as the therapist she was seeing previously no longer takes her insurance.  She is seeing a therapist in preparation for bariatric surgery.  Provided info on [email or web address] to find a new provider.  Handicap placard Patient request paperwork for new handicap placard for her car.  Given HFpEF, COPD, chronic back pain, and bilateral chronic knee pain feels appropriate at this time.  Will fill out paperwork and have ready at the front desk for her to pick up next week.  PERTINENT  PMH / PSH: HFpEF, OSA, COPD, IBD (diarrhea), GERD, T2DM, chronic back pain, bilateral chronic knee pain, morbid obesity, HLD, PAD, HTN, atherosclerosis with limb claudication, bipolar affective disorder.  OBJECTIVE:   BP  124/74   Pulse 91   Ht 5' (1.524 m)   Wt 257 lb (116.6 kg)   SpO2 97%   BMI 50.19 kg/m    PHQ-9:  Depression screen [facility] 2/9 09/13/2020 08/07/2020 06/12/2020  Decreased Interest 0 0 1  Down, Depressed, Hopeless 2 0 0  PHQ - 2 Score 2 0 1  Altered sleeping 1 2 0  Tired, decreased energy 0 0 1  Change in appetite 1 0 0  Feeling bad or failure about yourself  0 0 0  Trouble concentrating 0 0 0  Moving slowly or fidgety/restless 0 0 0  Suicidal thoughts 0 0 0  PHQ-9 Score 4 2 2   Difficult doing work/chores Somewhat difficult Not difficult at all -  Some recent data might be hidden     GAD-7: No flowsheet data found.    Physical Exam General: Awake, alert, oriented, no acute distress Respiratory: Normal work of breathing, no respiratory distress Extremities: No bilateral lower extremity edema, right knee slightly tender to palpation over medial aspect otherwise nontender, left knee nontender, no swelling/redness/warmth of either knee Neuro: Cranial nerves II through X grossly intact, able to move all extremities spontaneously   ASSESSMENT/PLAN:   Bilateral chronic knee pain Concern for worsening osteoarthritis. Voltaren gel script sent. Tylenol daily, up to BID. Ordered bilateral 2 view knee XR. PT referral. Will consider ortho follow up pending XR results.   Morbid obesity ([place]) Referral to healthy weight and wellness clinic.  Elizabeth Essex, MD [place]

## 2020-09-13 NOTE — Assessment & Plan Note (Signed)
Concern for worsening osteoarthritis. Voltaren gel script sent. Tylenol daily, up to BID. Ordered bilateral 2 view knee XR. PT referral. Will consider ortho follow up pending XR results.

## 2020-09-13 NOTE — Assessment & Plan Note (Signed)
Referral to healthy weight and wellness clinic.

## 2020-09-13 NOTE — Patient Instructions (Signed)
It was wonderful to see you today. Thank you for allowing me to be a part of your care. Below is a short summary of what we discussed at your visit today:  Knee pain Because of your worsening chronic knee pain, we are going to do the following things: -Use Voltaren gel on your skin on both knees, prescription sent to your pharmacy -Take Tylenol daily, up to 2 times daily to help with baseline pain -X-rays of both knees to see if you have worsening osteoarthritis at the [facility] imaging center.  Using the phone number on the handout provided, you may schedule this for any time that is convenient for you. -Refer you for physical therapy.  Someone should be contacting you soon to schedule this.  Handicap placard  I have printed off the form and will complete it soon.  You may pick it up at the front desk next week.  Weight loss I will refer you to the healthy weight and wellness clinic.  They do a lot of metabolic testing and have prescription food plans to help you lose weight.  Mental Health resources Visit "www.[email or web address]" for listing the providers in the [place] area that take your insurance.  Call the numbers on their website listings to schedule an appointment.   If you have any questions or concerns, please do not hesitate to contact us via phone or MyChart message.   Ezequiel Essex, MD

## 2020-10-01 ENCOUNTER — Ambulatory Visit: Payer: Medicare Other | Attending: Family Medicine | Admitting: Physical Therapy

## 2020-10-02 ENCOUNTER — Ambulatory Visit: Payer: Medicare Other | Admitting: Family Medicine

## 2020-11-21 ENCOUNTER — Other Ambulatory Visit: Payer: Self-pay | Admitting: Family Medicine

## 2020-11-21 DIAGNOSIS — R600 Localized edema: Secondary | ICD-10-CM

## 2020-11-25 ENCOUNTER — Ambulatory Visit: Payer: Medicare Other

## 2020-11-25 NOTE — Progress Notes (Deleted)
    SUBJECTIVE:   CHIEF COMPLAINT / HPI: STD screening  ***  PERTINENT  PMH / PSH: IBS diarrhea type, GERD, T2DM, HFpEF, hypertension, COPD  OBJECTIVE:   There were no vitals taken for this visit.  ***  ASSESSMENT/PLAN:   No problem-specific Assessment & Plan notes found for this encounter.     Patriciaann Clan, [place]

## 2020-11-26 ENCOUNTER — Other Ambulatory Visit (HOSPITAL_COMMUNITY)
Admission: RE | Admit: 2020-11-26 | Discharge: 2020-11-26 | Disposition: A | Discharge status: Home / Self Care | Payer: Medicare Other | Source: Ambulatory Visit | Attending: Family Medicine | Admitting: Family Medicine

## 2020-11-26 ENCOUNTER — Other Ambulatory Visit: Payer: Self-pay

## 2020-11-26 ENCOUNTER — Ambulatory Visit (INDEPENDENT_AMBULATORY_CARE_PROVIDER_SITE_OTHER): Payer: Medicare Other | Admitting: Family Medicine

## 2020-11-26 VITALS — BP 120/60 | HR 82 | Ht 60.0 in | Wt 251.6 lb

## 2020-11-26 DIAGNOSIS — Z113 Encounter for screening for infections with a predominantly sexual mode of transmission: Secondary | ICD-10-CM | POA: Insufficient documentation

## 2020-11-26 DIAGNOSIS — Z114 Encounter for screening for human immunodeficiency virus [HIV]: Secondary | ICD-10-CM

## 2020-11-26 LAB — POCT WET PREP (WET MOUNT)
Clue Cells Wet Prep Whiff POC: POSITIVE
Trichomonas Wet Prep HPF POC: ABSENT

## 2020-11-26 NOTE — Patient Instructions (Addendum)
Thank you for coming to see me today. It was a pleasure. Today we talked about:   We performed STD testing today. This will take a few days to come back. If your MyChart is activated, we will message you on there if everything is normal, otherwise we will call. If we need to treat something we will also call you. If you do not hear from [place] in the next 4 days, please give [place] a call.  Please follow-up with PCP as scheduled.  If you have any questions or concerns, please do not hesitate to call the office at [phone].  Best,   [place] Constable, DO

## 2020-11-26 NOTE — Progress Notes (Signed)
    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   Contraception: no.   No LMP recorded. Patient is premenopausal.  She does not douche. Last Pap: 12/2019, NILM with Neg HPV Desires HIV/RPR: yes Prior Hep C Ab: yes Throat or anal swab for G/C: throat and vagina  PERTINENT  PMH / PSH: PAD, hypertension, HFpEF, COPD, T2DM, bipolar disorder  OBJECTIVE:   BP 120/60   Pulse 82   Ht 5' (1.524 m)   Wt 251 lb 9.6 oz (114.1 kg)   SpO2 98%   BMI 49.14 kg/m    Physical Exam: General: In NAD Respiratory: Breathing comfortably on room air GU: Pelvic exam performed with patient supine.  Chaperone in room.  Bilateral labia without abnormalities.  Cervix exhibits no discharge, no cervix abnormalities.  No vaginal lesions.  Vaginal discharge thin, white.  Results for orders placed or performed in visit on 11/26/20 (from the past 24 hour(s))  POCT Wet Prep Lenard Forth [place])     Status: Abnormal   Collection Time: 11/26/20  2:35 PM  Result Value Ref Range   Source Wet Prep POC VAG    WBC, Wet Prep HPF POC 0-2    Bacteria Wet Prep HPF POC Moderate (A) Few   Clue Cells Wet Prep HPF POC Few (A) None   Clue Cells Wet Prep Whiff POC Positive Whiff    Yeast Wet Prep HPF POC None None   KOH Wet Prep POC None None   Trichomonas Wet Prep HPF POC Absent Absent      ASSESSMENT/PLAN:   Screen for sexually transmitted diseases STD testing performed today.  No trichomonas on wet prep.  Patient denies any vaginal complaints, does have some clue cells on wet prep, but no need to treat given lack of complaints.  We will follow-up on her throat and vaginal  gonorrhea/chlamydia swabs, HIV, RPR.  Advised to follow-up with PCP soon for other concerns.     Cleophas Dunker, [place]

## 2020-11-26 NOTE — Assessment & Plan Note (Signed)
STD testing performed today.  No trichomonas on wet prep.  Patient denies any vaginal complaints, does have some clue cells on wet prep, but no need to treat given lack of complaints.  We will follow-up on her throat and vaginal gonorrhea/chlamydia swabs, HIV, RPR.  Advised to follow-up with PCP soon for other concerns.

## 2020-11-27 LAB — CERVICOVAGINAL ANCILLARY ONLY
Chlamydia: NEGATIVE
Chlamydia: NEGATIVE
Comment: NEGATIVE
Comment: NEGATIVE
Comment: NORMAL
Comment: NORMAL
Neisseria Gonorrhea: NEGATIVE
Neisseria Gonorrhea: NEGATIVE

## 2020-12-04 ENCOUNTER — Telehealth: Payer: Self-pay | Admitting: Family Medicine

## 2020-12-04 ENCOUNTER — Other Ambulatory Visit: Payer: Self-pay | Admitting: Family Medicine

## 2020-12-04 DIAGNOSIS — R600 Localized edema: Secondary | ICD-10-CM

## 2020-12-04 DIAGNOSIS — G8929 Other chronic pain: Secondary | ICD-10-CM

## 2020-12-04 DIAGNOSIS — M549 Dorsalgia, unspecified: Secondary | ICD-10-CM

## 2020-12-04 DIAGNOSIS — E119 Type 2 diabetes mellitus without complications: Secondary | ICD-10-CM

## 2020-12-04 NOTE — Telephone Encounter (Signed)
Attempted to contact Elizabeth Diaz regarding her three prescription refills requests. I refilled the ozempic and cyclobenzaprine, as each were last refilled one month ago. However, the lasix was just refilled with 30 tablets to be used PRN for edema just 1.5 weeks ago.   No answer, left HIPPA-safe VM.   Sent refills for Ozempic and cyclobenzaprine. Denied lasix for now. If having such significant edema that she is already out of 30 tablets, I will want to see her in the office for an appointment.   Ezequiel Essex, MD

## 2020-12-05 ENCOUNTER — Telehealth: Payer: Self-pay | Admitting: Family Medicine

## 2020-12-05 NOTE — Telephone Encounter (Signed)
[place], spoke with Maudie Mercury. Kim confirmed patient had not yet picked up Flexeril. I asked her to cancel that prescription. At the time I refilled the Flexeril yesterday, I neglected to remember that Dr. Owens Shark had recommended stopping the Flexeril in favor of baclofen, as it has a safer profile for a patient like Ms. Dorina Hoyer.   I will attempt again to call Ms. Talwar and talk about this medication.   Additionally, the pharmacy had a different phone number on file from our records. Their records indicate [phone] as her phone number. I will call that number to confirm and if it is accurate, I will add it to her contact list.   Ezequiel Essex, MD

## 2020-12-05 NOTE — Telephone Encounter (Signed)
Attempted to call Ms. Schoon to discuss the Flexeril refill requested. Per her pharmacy, their phone number on file for her was [phone]. This phone line was not answered and had no option for VM. Called number on file with [place], [phone]. Went straight to [employer]. Left HIPPA-safe VM.   In addition to discussing switching Flexeril for baclofen, I also wanted to schedule her grandchild's Gage (for whom she is the primary caregiver). Grandchild's name "Loreta Ave", MRN [id number].   Ezequiel Essex, MD

## 2020-12-19 ENCOUNTER — Telehealth: Payer: Self-pay | Admitting: *Deleted

## 2020-12-19 DIAGNOSIS — E785 Hyperlipidemia, unspecified: Secondary | ICD-10-CM | POA: Diagnosis not present

## 2020-12-19 DIAGNOSIS — I110 Hypertensive heart disease with heart failure: Secondary | ICD-10-CM | POA: Diagnosis not present

## 2020-12-19 DIAGNOSIS — I739 Peripheral vascular disease, unspecified: Secondary | ICD-10-CM | POA: Diagnosis not present

## 2020-12-19 DIAGNOSIS — I5030 Unspecified diastolic (congestive) heart failure: Secondary | ICD-10-CM | POA: Diagnosis not present

## 2020-12-19 NOTE — Telephone Encounter (Signed)
Received fax from [facility], requesting a 90 day supply of Ozempic. Chayse Zatarain Zimmerman Rumple, CMA

## 2020-12-25 ENCOUNTER — Ambulatory Visit: Payer: Medicare Other

## 2020-12-26 ENCOUNTER — Other Ambulatory Visit: Payer: Self-pay

## 2020-12-26 ENCOUNTER — Ambulatory Visit (INDEPENDENT_AMBULATORY_CARE_PROVIDER_SITE_OTHER): Payer: Medicare Other | Admitting: Family Medicine

## 2020-12-26 VITALS — BP 148/74 | HR 91 | Wt 246.6 lb

## 2020-12-26 DIAGNOSIS — M25562 Pain in left knee: Secondary | ICD-10-CM | POA: Diagnosis not present

## 2020-12-26 DIAGNOSIS — G4719 Other hypersomnia: Secondary | ICD-10-CM

## 2020-12-26 DIAGNOSIS — M25561 Pain in right knee: Secondary | ICD-10-CM | POA: Diagnosis not present

## 2020-12-26 DIAGNOSIS — G8929 Other chronic pain: Secondary | ICD-10-CM

## 2020-12-26 DIAGNOSIS — E119 Type 2 diabetes mellitus without complications: Secondary | ICD-10-CM

## 2020-12-26 DIAGNOSIS — E1142 Type 2 diabetes mellitus with diabetic polyneuropathy: Secondary | ICD-10-CM

## 2020-12-26 DIAGNOSIS — M549 Dorsalgia, unspecified: Secondary | ICD-10-CM

## 2020-12-26 MED ORDER — GABAPENTIN 300 MG PO CAPS: 300.0000 mg | ORAL_CAPSULE | Freq: Two times a day (BID) | ORAL | 0 refills | Status: DC

## 2020-12-26 NOTE — Patient Instructions (Signed)
It was nice to see you today,  I will have your PCP Dr. Augustin Coupe fill out your paperwork for you.  I have refilled your gabapentin at the dose that was prescribed by Dr. Jeani Hawking.  I have ordered some blood test for you to evaluate your fatigue.  I would encourage you to use your CPAP machine every night.  This is what will help prevent you from becoming tired in the day.  Please schedule follow-up appoint with your PCP, Dr. Jeani Hawking as soon as possible.  Have a great day,  Clemetine Marker, MD

## 2020-12-26 NOTE — Progress Notes (Signed)
    SUBJECTIVE:   CHIEF COMPLAINT / HPI:   Neuropathy: Needs refill on gabapentin for her neuropathy.  States she takes it 3 times a day.she states a Dr. Truddie Crumble recently increased the dose to three times a day in January.    Falling asleep: patient has been having daytime somnolence. She takes flexeril, hydroxyzine, and gabapentin multiple times a day.  She has a CPAP machine but has not used it in months.    PERTINENT  PMH / PSH: diabetic neuropathy, osa  OBJECTIVE:   BP (!) 148/74   Pulse 91   Wt 246 lb 9.6 oz (111.9 kg)   SpO2 96%   BMI 48.16 kg/m   Gen: alert, oriented. No acute distress.  Fell asleep briefly at one point but was easily awoken.  Cv: RRR. No murmurs Pulm: lctab.  No wheezes.  Decreased inspiratory effort.   ASSESSMENT/PLAN:   Diabetic polyneuropathy associated with type 2 diabetes mellitus ([place]) Pt states she is out of her gabapentin.  States another provider recently increased it to 600 TID.  Rx listed in her chart is for 300 BID.  Told pt I would refill the amount listed in her chart and she can discuss changes with her pcp.    Excessive daytime sleepiness Chronic issue.  Likely multifactorial: noncompliance with cpap, osa, use of multiple sedating medications.  Advised pt to be compliant with her cpap.  Pt should work with her pcp on de-escalating some of her sedating medications.  Ordered tsh and cbc.  Pt states she did not have time currently for the labs but will come in for future lab appt.       Benay Pike, MD [place]

## 2020-12-27 DIAGNOSIS — E1142 Type 2 diabetes mellitus with diabetic polyneuropathy: Secondary | ICD-10-CM | POA: Insufficient documentation

## 2020-12-27 NOTE — Assessment & Plan Note (Addendum)
Chronic issue.  Likely multifactorial: noncompliance with cpap, osa, use of multiple sedating medications.  Advised pt to be compliant with her cpap.  Pt should work with her pcp on de-escalating some of her sedating medications.  Ordered tsh and cbc.  Pt states she did not have time currently for the labs but will come in for future lab appt.

## 2020-12-27 NOTE — Assessment & Plan Note (Signed)
Pt states she is out of her gabapentin.  States another provider recently increased it to 600 TID.  Rx listed in her chart is for 300 BID.  Told pt I would refill the amount listed in her chart and she can discuss changes with her pcp.

## 2020-12-29 ENCOUNTER — Encounter: Payer: Self-pay | Admitting: Family Medicine

## 2021-01-01 ENCOUNTER — Telehealth: Payer: Self-pay | Admitting: Family Medicine

## 2021-01-01 ENCOUNTER — Other Ambulatory Visit: Payer: Self-pay | Admitting: Family Medicine

## 2021-01-01 DIAGNOSIS — E119 Type 2 diabetes mellitus without complications: Secondary | ICD-10-CM

## 2021-01-01 NOTE — Telephone Encounter (Signed)
Patient is calling asking about the paperwork that was giving to Dr. Jeannine Kitten and last appointment. Please advise. Patient also provided a new number. [phone]. Thanks!

## 2021-01-05 NOTE — Telephone Encounter (Signed)
Dr. Jeani Hawking, her pcp, is taking care of this paperwork.

## 2021-01-05 NOTE — Telephone Encounter (Signed)
I will not consider filling out this paperwork until she schedules and completes the well child check for her granddaughter Elizabeth Diaz, for whom she is the legal guardian. I have written this on her paperwork and placed it in the front desk filing folders.   I have become concerned for the welfare of Elizabeth Diaz over the course of several months. Detailed phone notes and letter may be found in Elizabeth Diaz's chart, MRN [id number]. I placed a CPS report for Elizabeth Diaz on Friday, 01/03/2021.   Ezequiel Essex, MD

## 2021-01-07 ENCOUNTER — Telehealth: Payer: Self-pay

## 2021-01-07 NOTE — Telephone Encounter (Signed)
Pt called requested a call needing to find out when her paperwork will be completed? Stated she dropped forms off a week ago.

## 2021-01-08 NOTE — Telephone Encounter (Signed)
Pt returned my call. Granddaughter no longer lives with her. The granddaughter is with her mom. Pt states that she needs the paperwork filled out as soon as possible. (Pt was confused as to why we were not filling out the paperwork when it has nothing to do with her granddaughter.) Pt was not upset just confused. Please advise. Ottis Stain, CMA

## 2021-01-08 NOTE — Telephone Encounter (Signed)
LVM for pt to call our office. Did not say pt name. I LVM saying paperwork will not be filled out until her Granddaughter has an completed appt with a Dr for a well child visit. I explained I did find an appt for tomorrow am if she is available and to call the office to confirm or reschedule appt. Ottis Stain, CMA

## 2021-01-08 NOTE — Telephone Encounter (Signed)
If Elizabeth Diaz is still the legal guardian for her granddaughter, [name], she will need to bring her in for a well child check before we talk about her disability paperwork.   Ezequiel Essex, MD

## 2021-01-08 NOTE — Telephone Encounter (Signed)
See previous note. Ottis Stain, CMA

## 2021-01-14 NOTE — Telephone Encounter (Signed)
By "no longer in charge" of the granddaughter, does she mean she is no longer legal guardian? Because unless legal guardianship has been transferred back to the mom or another adult, Ms. Prehn will need to bring the child in.   If she calls back, please have her clarify whether or not she is still the legal guardian.   Ms. Birchard will also need an appointment with [place] to assess the reason for her wanting disability paperwork. We have not discussed this before and I will need to meet with her and elucidate what conditions she believes she needs disability for. Need to assess her limitations due to medical conditions.  Also, depending on what kind of disability paperwork she needs, she may have to see an occupational provider.   Ezequiel Essex, MD

## 2021-01-14 NOTE — Telephone Encounter (Signed)
Patient calling about paperwork Pt is no longer in charge of the granddaughter. The granddaughter is with mom. Please advise. Ottis Stain, CMA

## 2021-01-17 ENCOUNTER — Telehealth: Payer: Self-pay

## 2021-01-17 DIAGNOSIS — Z9189 Other specified personal risk factors, not elsewhere classified: Secondary | ICD-10-CM

## 2021-01-17 NOTE — Progress Notes (Signed)
[facility])                                            [facility] Team                                        Statin Quality Measure Assessment    01/17/2021  Elizabeth Diaz 31-Aug-1966 [id number]   Next Appointment: 01/27/2021 (w/ Dr. Jeani Hawking)  Per review of chart and payor information, this patient has been flagged for non-adherence to the following CMS Quality Measure:   [x]  Statin Use in Persons with Diabetes  [x]  Statin Use in Persons with Cardiovascular Disease  The 10-year ASCVD risk score Mikey Bussing DC Brooke Bonito., et al., 2013) is: 17.1%   Values used to calculate the score:     Age: 54 years     Sex: Female     Is Non-Hispanic African American: Yes     Diabetic: Yes     Tobacco smoker: No     Systolic Blood Pressure: 809 mmHg     Is BP treated: Yes     HDL Cholesterol: 54 mg/dL     Total Cholesterol: 182 mg/dL   Currently prescribed statin:  [x]  Yes []  No     Comments: Atorvastatin 80 mg QD - last picked up from [place] 04/03/2020. Atorvastatin rescription renewal written 12/19/2020 and per [facility] patient did not pick up med and it was returned. I called the patient and she stated she picked up atorvastatin "the other day". I called the pharmacy again with the patient on the phone and was told she did not pick up medication. Patient requested medication to be refilled.  Later, I discussed medication compliance suggestions (taking atorvastatin with AM or PM meds, pillbox, and incorporating med administration into morning/evening routine by placing medication in a certain location). Patient verbalized understanding of med adherence education and stated that she would try to take it in the morning and leave meds on nightstand. Of note, patient does state that she has a few tablets of atorvastatin at home but unsure the quantity since was not at home during the telephone encounter.  I informed patient that I will call her  back in a few days to confirm statin was picked up.    Please consider the following recommendations:   At the next appointment, following up on medication compliance at the next appointment.   Thank you for your time,  Kristeen Miss, [place] Cell: [phone]

## 2021-01-21 ENCOUNTER — Other Ambulatory Visit: Payer: Self-pay | Admitting: Family Medicine

## 2021-01-21 DIAGNOSIS — R600 Localized edema: Secondary | ICD-10-CM

## 2021-01-27 ENCOUNTER — Ambulatory Visit: Payer: Medicare Other | Admitting: Family Medicine

## 2021-01-30 DIAGNOSIS — I70219 Atherosclerosis of native arteries of extremities with intermittent claudication, unspecified extremity: Secondary | ICD-10-CM | POA: Diagnosis not present

## 2021-01-30 DIAGNOSIS — I739 Peripheral vascular disease, unspecified: Secondary | ICD-10-CM | POA: Diagnosis not present

## 2021-01-30 DIAGNOSIS — I5032 Chronic diastolic (congestive) heart failure: Secondary | ICD-10-CM | POA: Diagnosis not present

## 2021-01-30 DIAGNOSIS — E785 Hyperlipidemia, unspecified: Secondary | ICD-10-CM | POA: Diagnosis not present

## 2021-02-04 DIAGNOSIS — K648 Other hemorrhoids: Secondary | ICD-10-CM | POA: Diagnosis not present

## 2021-02-04 DIAGNOSIS — M1990 Unspecified osteoarthritis, unspecified site: Secondary | ICD-10-CM | POA: Diagnosis not present

## 2021-02-04 DIAGNOSIS — K635 Polyp of colon: Secondary | ICD-10-CM | POA: Diagnosis not present

## 2021-02-04 DIAGNOSIS — K5730 Diverticulosis of large intestine without perforation or abscess without bleeding: Secondary | ICD-10-CM | POA: Diagnosis not present

## 2021-02-04 DIAGNOSIS — K5731 Diverticulosis of large intestine without perforation or abscess with bleeding: Secondary | ICD-10-CM | POA: Diagnosis not present

## 2021-02-04 DIAGNOSIS — F17200 Nicotine dependence, unspecified, uncomplicated: Secondary | ICD-10-CM | POA: Diagnosis not present

## 2021-02-04 DIAGNOSIS — I110 Hypertensive heart disease with heart failure: Secondary | ICD-10-CM | POA: Diagnosis not present

## 2021-02-04 DIAGNOSIS — Z8673 Personal history of transient ischemic attack (TIA), and cerebral infarction without residual deficits: Secondary | ICD-10-CM | POA: Diagnosis not present

## 2021-02-04 DIAGNOSIS — R197 Diarrhea, unspecified: Secondary | ICD-10-CM | POA: Diagnosis not present

## 2021-02-04 DIAGNOSIS — K219 Gastro-esophageal reflux disease without esophagitis: Secondary | ICD-10-CM | POA: Diagnosis not present

## 2021-02-04 DIAGNOSIS — Z79899 Other long term (current) drug therapy: Secondary | ICD-10-CM | POA: Diagnosis not present

## 2021-02-04 DIAGNOSIS — Z9989 Dependence on other enabling machines and devices: Secondary | ICD-10-CM | POA: Diagnosis not present

## 2021-02-04 DIAGNOSIS — I509 Heart failure, unspecified: Secondary | ICD-10-CM | POA: Diagnosis not present

## 2021-02-04 DIAGNOSIS — E1151 Type 2 diabetes mellitus with diabetic peripheral angiopathy without gangrene: Secondary | ICD-10-CM | POA: Diagnosis not present

## 2021-02-04 DIAGNOSIS — K921 Melena: Secondary | ICD-10-CM | POA: Diagnosis not present

## 2021-02-04 DIAGNOSIS — G4730 Sleep apnea, unspecified: Secondary | ICD-10-CM | POA: Diagnosis not present

## 2021-02-04 DIAGNOSIS — Z8614 Personal history of Methicillin resistant Staphylococcus aureus infection: Secondary | ICD-10-CM | POA: Diagnosis not present

## 2021-02-11 ENCOUNTER — Ambulatory Visit: Payer: Medicare Other | Admitting: Family Medicine

## 2021-02-13 ENCOUNTER — Other Ambulatory Visit: Payer: Self-pay | Admitting: Family Medicine

## 2021-02-13 DIAGNOSIS — M549 Dorsalgia, unspecified: Secondary | ICD-10-CM

## 2021-02-13 DIAGNOSIS — E119 Type 2 diabetes mellitus without complications: Secondary | ICD-10-CM

## 2021-02-13 DIAGNOSIS — G8929 Other chronic pain: Secondary | ICD-10-CM

## 2021-02-26 ENCOUNTER — Ambulatory Visit: Payer: Medicare Other | Admitting: Family Medicine

## 2021-03-01 DIAGNOSIS — M47812 Spondylosis without myelopathy or radiculopathy, cervical region: Secondary | ICD-10-CM | POA: Diagnosis not present

## 2021-03-01 DIAGNOSIS — M1711 Unilateral primary osteoarthritis, right knee: Secondary | ICD-10-CM | POA: Diagnosis not present

## 2021-03-01 DIAGNOSIS — R079 Chest pain, unspecified: Secondary | ICD-10-CM | POA: Diagnosis not present

## 2021-03-01 DIAGNOSIS — R918 Other nonspecific abnormal finding of lung field: Secondary | ICD-10-CM | POA: Diagnosis not present

## 2021-03-01 DIAGNOSIS — R519 Headache, unspecified: Secondary | ICD-10-CM | POA: Diagnosis not present

## 2021-03-01 DIAGNOSIS — S3992XA Unspecified injury of lower back, initial encounter: Secondary | ICD-10-CM | POA: Diagnosis not present

## 2021-03-01 DIAGNOSIS — M1712 Unilateral primary osteoarthritis, left knee: Secondary | ICD-10-CM | POA: Diagnosis not present

## 2021-03-01 DIAGNOSIS — M546 Pain in thoracic spine: Secondary | ICD-10-CM | POA: Diagnosis not present

## 2021-03-01 DIAGNOSIS — R0789 Other chest pain: Secondary | ICD-10-CM | POA: Diagnosis not present

## 2021-03-01 DIAGNOSIS — M549 Dorsalgia, unspecified: Secondary | ICD-10-CM | POA: Diagnosis not present

## 2021-03-01 DIAGNOSIS — R52 Pain, unspecified: Secondary | ICD-10-CM | POA: Diagnosis not present

## 2021-03-01 DIAGNOSIS — M542 Cervicalgia: Secondary | ICD-10-CM | POA: Diagnosis not present

## 2021-03-01 DIAGNOSIS — M47814 Spondylosis without myelopathy or radiculopathy, thoracic region: Secondary | ICD-10-CM | POA: Diagnosis not present

## 2021-03-01 DIAGNOSIS — M545 Low back pain, unspecified: Secondary | ICD-10-CM | POA: Diagnosis not present

## 2021-03-01 DIAGNOSIS — S299XXA Unspecified injury of thorax, initial encounter: Secondary | ICD-10-CM | POA: Diagnosis not present

## 2021-03-01 DIAGNOSIS — R109 Unspecified abdominal pain: Secondary | ICD-10-CM | POA: Diagnosis not present

## 2021-03-01 DIAGNOSIS — R1084 Generalized abdominal pain: Secondary | ICD-10-CM | POA: Diagnosis not present

## 2021-03-01 DIAGNOSIS — M2578 Osteophyte, vertebrae: Secondary | ICD-10-CM | POA: Diagnosis not present

## 2021-03-01 DIAGNOSIS — Z041 Encounter for examination and observation following transport accident: Secondary | ICD-10-CM | POA: Diagnosis not present

## 2021-03-01 DIAGNOSIS — S0990XA Unspecified injury of head, initial encounter: Secondary | ICD-10-CM | POA: Diagnosis not present

## 2021-03-01 DIAGNOSIS — S199XXA Unspecified injury of neck, initial encounter: Secondary | ICD-10-CM | POA: Diagnosis not present

## 2021-03-01 DIAGNOSIS — S3991XA Unspecified injury of abdomen, initial encounter: Secondary | ICD-10-CM | POA: Diagnosis not present

## 2021-03-05 ENCOUNTER — Ambulatory Visit: Payer: Medicare Other | Admitting: Family Medicine

## 2021-03-07 ENCOUNTER — Other Ambulatory Visit: Payer: Self-pay

## 2021-03-07 ENCOUNTER — Other Ambulatory Visit (HOSPITAL_COMMUNITY)
Admission: RE | Admit: 2021-03-07 | Discharge: 2021-03-07 | Disposition: A | Discharge status: Home / Self Care | Payer: Medicare Other | Source: Ambulatory Visit | Attending: Family Medicine | Admitting: Family Medicine

## 2021-03-07 ENCOUNTER — Encounter: Payer: Self-pay | Admitting: Family Medicine

## 2021-03-07 ENCOUNTER — Ambulatory Visit (INDEPENDENT_AMBULATORY_CARE_PROVIDER_SITE_OTHER): Payer: Medicare Other | Admitting: Family Medicine

## 2021-03-07 VITALS — BP 128/70 | HR 84 | Ht 60.0 in | Wt 237.0 lb

## 2021-03-07 DIAGNOSIS — M549 Dorsalgia, unspecified: Secondary | ICD-10-CM

## 2021-03-07 DIAGNOSIS — M25561 Pain in right knee: Secondary | ICD-10-CM

## 2021-03-07 DIAGNOSIS — F315 Bipolar disorder, current episode depressed, severe, with psychotic features: Secondary | ICD-10-CM

## 2021-03-07 DIAGNOSIS — E119 Type 2 diabetes mellitus without complications: Secondary | ICD-10-CM

## 2021-03-07 DIAGNOSIS — Z113 Encounter for screening for infections with a predominantly sexual mode of transmission: Secondary | ICD-10-CM

## 2021-03-07 DIAGNOSIS — I70219 Atherosclerosis of native arteries of extremities with intermittent claudication, unspecified extremity: Secondary | ICD-10-CM

## 2021-03-07 DIAGNOSIS — Z8659 Personal history of other mental and behavioral disorders: Secondary | ICD-10-CM

## 2021-03-07 DIAGNOSIS — R600 Localized edema: Secondary | ICD-10-CM

## 2021-03-07 DIAGNOSIS — G8929 Other chronic pain: Secondary | ICD-10-CM

## 2021-03-07 DIAGNOSIS — I739 Peripheral vascular disease, unspecified: Secondary | ICD-10-CM | POA: Diagnosis not present

## 2021-03-07 DIAGNOSIS — R040 Epistaxis: Secondary | ICD-10-CM

## 2021-03-07 DIAGNOSIS — M25562 Pain in left knee: Secondary | ICD-10-CM | POA: Diagnosis not present

## 2021-03-07 LAB — POCT GLYCOSYLATED HEMOGLOBIN (HGB A1C): HbA1c, POC (prediabetic range): 6 % (ref 5.7–6.4)

## 2021-03-07 MED ORDER — OZEMPIC (0.25 OR 0.5 MG/DOSE) 2 MG/1.5ML ~~LOC~~ SOPN: 0.5000 mg | PEN_INJECTOR | Freq: Every day | SUBCUTANEOUS | 3 refills | Status: DC

## 2021-03-07 MED ORDER — GABAPENTIN 300 MG PO CAPS: ORAL_CAPSULE | ORAL | 0 refills | Status: DC

## 2021-03-07 MED ORDER — BACLOFEN 10 MG PO TABS: 10.0000 mg | ORAL_TABLET | Freq: Three times a day (TID) | ORAL | 0 refills | Status: DC

## 2021-03-07 MED ORDER — FUROSEMIDE 40 MG PO TABS: 20.0000 mg | ORAL_TABLET | Freq: Every day | ORAL | 0 refills | Status: DC | PRN

## 2021-03-07 MED ORDER — AFRIN NASAL SPRAY 0.05 % NA SOLN: 1.0000 | Freq: Every day | NASAL | 0 refills | Status: DC | PRN

## 2021-03-07 NOTE — Assessment & Plan Note (Signed)
Per patient request, placed referral for physical therapy.

## 2021-03-07 NOTE — Assessment & Plan Note (Signed)
Per patient request, placed referral to psychology and psychiatry.

## 2021-03-07 NOTE — Assessment & Plan Note (Addendum)
A1c today 6.0, within goal.  Continue Ozempic 0.5 weekly and healthy eating habits.  Next A1c recheck indicated 6 to 12 months.  Today obtained urine sample for urine protein microalbumin ratio (due for annual check) and UA (patient reports one of her specialists that she had protein in her urine and patient is worried about this).

## 2021-03-07 NOTE — Progress Notes (Addendum)
    SUBJECTIVE:   CHIEF COMPLAINT / HPI:   STI screening -Reports intercourse with partner who believes she has trichomonas -Patient only interested in vaginal swab for GC, chlamydia, trichomonas today  Medicine refills -Today patient requests refills of furosemide, cyclobenzaprine, Afrin -Furosemide prescribed as needed for edema, she reports she has not used it much and confirms she is not having increased incidence of swelling -Discussed cyclobenzaprine with patient today, patient amenable to switch to baclofen - Patient uses Afrin only as needed for nosebleeds, does not use every day, does not have rebound symptoms  A1c recheck -Has type 2 diabetes - Last A1c 06/12/2020 measured 6.1 - If within goal today, will not need to recheck for 6 to 12 months -Patient request refill of Ozempic today  Handicap placard -Patient requests paperwork for new placard today - Has difficulty ambulating due to PAD and limb claudication, HFpEF, COPD  Referrals -Patient is a requests referrals to psychiatry, psychology for treatment of her bipolar affective disorder, depression, anxiety -Patient also requests referral to physical therapy for her leg pain from claudication and COPD  PERTINENT  PMH / PSH: Peripheral artery disease with limb claudication, hypertension, HFpEF, COPD, OSA, GERD, T2DM, HLD  OBJECTIVE:   BP 128/70   Pulse 84   Ht 5' (1.524 m)   Wt 237 lb (107.5 kg)   SpO2 98%   BMI 46.29 kg/m   Physical Exam General: Awake, alert, oriented, no acute distress Respiratory: Normal work of breathing, no respiratory distress Neuro: Cranial nerves II through X grossly intact, able to move all extremities spontaneously Vulva: Normal appearing vulva without rashes, lesions, or deformities Vagina: Pale pink rugated vaginal tissue without obvious lesions, physiologic discharge of whitish color, cervix without lesion or overt tenderness with swab  ASSESSMENT/PLAN:   Routine screening for  STI (sexually transmitted infection) Patient today requested vaginal swab for GC, chlamydia, trichomonas.  Declines HIV and syphilis blood testing. Patient concern for exposure.  Denies pain, discharge, rash.   Type 2 diabetes mellitus without complication, without long-term current use of insulin ([facility]) A1c today 6.0, within goal.  Continue Ozempic 0.5 weekly and healthy eating habits.  Next A1c recheck indicated 6 to 12 months.  Today obtained urine sample for urine protein microalbumin ratio (due for annual check) and UA (patient reports one of her specialists that she had protein in her urine and patient is worried about this).  Bipolar affective disorder, depressed ([place]) Per patient request, placed referral to psychology and psychiatry.  PAD (peripheral artery disease) ([place]) Per patient request, placed referral for physical therapy.     Ezequiel Essex, MD [place]

## 2021-03-07 NOTE — Assessment & Plan Note (Signed)
Patient today requested vaginal swab for GC, chlamydia, trichomonas.  Declines HIV and syphilis blood testing. Patient concern for exposure.  Denies pain, discharge, rash.

## 2021-03-07 NOTE — Patient Instructions (Signed)
It was wonderful to see you today. Thank you for allowing me to be a part of your care. Below is a short summary of what we discussed at your visit today:  STI testing Today we swabbed for gonorrhea, chlamydia, and trichomonas. If the results are normal, I will send you a letter or MyChart message. If the results are abnormal, I will give you a call.    Diabetes testing Today we testing your A1c (average blood sugar). Also got a urine sample to make sure your kidneys are not being damaged by the diabetes. Again, If the results are normal, I will send you a letter or MyChart message. If the results are abnormal, I will give you a call.    Handicap placard I will finish this form and place it at the front desk for your pick up later. We will give you a call to let you know it is available.   Please bring all of your medications to every appointment!  If you have any questions or concerns, please do not hesitate to contact us via phone or MyChart message.   Ezequiel Essex, MD

## 2021-03-08 ENCOUNTER — Encounter: Payer: Self-pay | Admitting: Family Medicine

## 2021-03-08 LAB — MICROALBUMIN / CREATININE URINE RATIO
Creatinine, Urine: 158.8 mg/dL
Microalb/Creat Ratio: 8 mg/g creat (ref 0–29)
Microalbumin, Urine: 13 ug/mL

## 2021-03-08 LAB — URINALYSIS
Bilirubin, UA: NEGATIVE
Glucose, UA: NEGATIVE
Ketones, UA: NEGATIVE
Nitrite, UA: POSITIVE — AB
Protein,UA: NEGATIVE
RBC, UA: NEGATIVE
Specific Gravity, UA: 1.021 (ref 1.005–1.030)
Urobilinogen, Ur: 0.2 mg/dL (ref 0.2–1.0)
pH, UA: 6.5 (ref 5.0–7.5)

## 2021-03-10 ENCOUNTER — Telehealth: Payer: Self-pay | Admitting: Family Medicine

## 2021-03-10 ENCOUNTER — Telehealth: Payer: Self-pay

## 2021-03-10 LAB — CERVICOVAGINAL ANCILLARY ONLY
Chlamydia: NEGATIVE
Comment: NEGATIVE
Comment: NEGATIVE
Comment: NORMAL
Neisseria Gonorrhea: NEGATIVE
Trichomonas: NEGATIVE

## 2021-03-10 NOTE — Telephone Encounter (Signed)
Patient contacted in regards to mychart message she sent. Patient reports she saw a positive result on her urine test. Patient denies any symptoms of a UTI at this time, however would like to be treated. Patient informed STD tests are not back yet. Will forward to PCP.

## 2021-03-10 NOTE — Telephone Encounter (Signed)
Patient is calling to see if Dr. Jeani Hawking has received the paper work from [place] concerning her aid at her house and receiving more hours.   I checked Dr. Arby Barrette box. I let her know that she might already have it but if she doesn't that we have not received it and she would need to ask to have it resent.   The best call back with any questions is [phone].

## 2021-03-11 NOTE — Telephone Encounter (Signed)
Called patient and she will call and have the form resent. I also gave her our fax number to make sure they have the correct number when resending it.

## 2021-03-19 ENCOUNTER — Other Ambulatory Visit: Payer: Self-pay

## 2021-03-19 ENCOUNTER — Ambulatory Visit: Payer: Medicare Other | Attending: Family Medicine

## 2021-03-19 DIAGNOSIS — M79604 Pain in right leg: Secondary | ICD-10-CM

## 2021-03-19 DIAGNOSIS — M79605 Pain in left leg: Secondary | ICD-10-CM | POA: Diagnosis not present

## 2021-03-19 DIAGNOSIS — M6281 Muscle weakness (generalized): Secondary | ICD-10-CM | POA: Insufficient documentation

## 2021-03-19 DIAGNOSIS — R2689 Other abnormalities of gait and mobility: Secondary | ICD-10-CM

## 2021-03-19 NOTE — Therapy (Signed)
[place], [place], [address] Phone: [phone]   Fax:  [phone]  Physical Therapy Evaluation  Patient Details  Name: Elizabeth Diaz MRN: [id number] Date of Birth: 30-Oct-1965 Referring Provider (PT): Lind Covert, MD   Encounter Date: 03/19/2021   PT End of Session - 03/19/21 1625     Visit Number 1    Number of Visits 21    Date for PT Re-Evaluation 05/28/21    Authorization Type UHC MCR and MCD secondary - FOTO 6th and 10th    Progress Note Due on Visit 10    PT Start Time 1401    PT Stop Time 1445    PT Time Calculation (min) 44 min    Activity Tolerance Patient tolerated treatment well;Patient limited by fatigue;Patient limited by pain    Behavior During Therapy [facility] for tasks assessed/performed             Past Medical History:  Diagnosis Date   Arthritis    Benign neoplasm of rectum    Chronic pain    Congestive heart failure (CHF) (HCC)    COPD (chronic obstructive pulmonary disease) ([place])    Diabetes mellitus ([place])    Diverticulosis    GERD (gastroesophageal reflux disease) 12/29/2016   HTN (hypertension) 12/29/2016   Hyperplastic colon polyp    Lower extremity edema 12/12/2015   Numbness    left leg   Obesity    Pneumothorax    Schatzki's ring    Screen for sexually transmitted diseases 08/07/2020   Seizures ([place])    hx of, no seizures last few years   Type 2 diabetes mellitus ([place]) 01/27/2018   UTI (urinary tract infection) 01/29/2020    Past Surgical History:  Procedure Laterality Date   CHEST TUBE INSERTION     COLONOSCOPY WITH PROPOFOL N/A 11/01/2014   Procedure: COLONOSCOPY WITH PROPOFOL;  Surgeon: Jerene Bears, MD;  [facility]: [facility];  Service: Gastroenterology;  Laterality: N/A;   ESOPHAGOGASTRODUODENOSCOPY (EGD) WITH PROPOFOL N/A 07/29/2015   Procedure: ESOPHAGOGASTRODUODENOSCOPY (EGD) WITH PROPOFOL;  Surgeon: Jerene Bears, MD;  [facility]: [facility];   Service: Gastroenterology;  Laterality: N/A;   PERIPHERAL VASCULAR CATHETERIZATION N/A 01/07/2016   Procedure: Lower Extremity Angiography;  Surgeon: Adrian Prows, MD;  Location: [place] [facility];  Service: Cardiovascular;  Laterality: N/A;   PERIPHERAL VASCULAR CATHETERIZATION Right 02/25/2016   Procedure: Peripheral Vascular Atherectomy;  Surgeon: Adrian Prows, MD;  Location: [place] [facility];  Service: Cardiovascular;  Laterality: Right;  SFA ATHERECTOMY/DRUG COATED PTA   TUBAL LIGATION      There were no vitals filed for this visit.    Subjective Assessment - 03/19/21 1404     Subjective Pt is a pleasant 55 y/o F who presents to PT with chronic LE pain secondary to intermittent claudication and PAD. She has two stents in her LEs to relieve pain from claudication, but notes that it continues to get worse . She states that they want to do a bypass in her LEs later this year, but does not have this scheduled. Pt also promotes numbness and tingling in anterior L thigh, along with slight paresthesias in R anterior LE. Pain increases in LE with prolonged standing or walking, with pt feeling very fatigue after activities such as shopping or walking parking lot distances. Pt notes that she has to use a scooter if shopping and ambulating long distances. Has had two falls related to leg giving out d/t fatigue.  Pertinent History HTN, PAD, COPD, spondylothisesis    Limitations Standing;Walking;Lifting    How long can you sit comfortably? a few hours    How long can you stand comfortably? 30 minutes    How long can you walk comfortably? 15-20 minutes    Patient Stated Goals Pt would like decrease LE pain and improve circulation; she would also like to increase her walking distance    Currently in Pain? Yes    Pain Score 7     Pain Location Leg    Pain Orientation Left;Right    Pain Type Chronic pain    Pain Onset More than a month ago    Pain Frequency Intermittent    Aggravating Factors  standing,  walking    Pain Relieving Factors rest                [facility] PT Assessment - 03/19/21 0001       Assessment   Medical Diagnosis I73.9 (ICD-10-CM) - PAD (peripheral artery disease) (HCC); I70.219 (ICD-10-CM) - Atherosclerosis with limb claudication (HCC); I73.9 (ICD-10-CM) - Claudication in peripheral vascular disease ([place])    Referring Provider (PT) Lind Covert, MD    Hand Dominance Left    Prior Therapy yes; previous OPPT for various musculoskeletal pains      Precautions   Precautions None      Restrictions   Weight Bearing Restrictions No      Balance Screen   Has the patient fallen in the past 6 months Yes    How many times? two - legs gave out during ambulation    Has the patient had a decrease in activity level because of a fear of falling?  No    Is the patient reluctant to leave their home because of a fear of falling?  No      Home Environment   Living Environment Private residence    Living Arrangements Children    Type of Elizabeth Diaz Access Level entry    Home Layout One level      Prior Function   Level of Independence Independent;Independent with basic ADLs      Observation/Other Assessments   Focus on Therapeutic Outcomes (FOTO)  42% function; 46% predicted      Strength   Right Hip Flexion 4+/5    Right Hip ABduction 4+/5    Right Hip ADduction 4+/5    Left Hip Flexion 4+/5    Left Hip ABduction 4+/5    Left Hip ADduction 4+/5    Right Knee Flexion 5/5    Right Knee Extension 5/5    Left Knee Flexion 5/5    Left Knee Extension 5/5      Transfers   Comments 30 Second STS: 10 reps - increased LE pain towards end      6 minute walk test results    Aerobic Endurance Distance Walked 1121    Endurance additional comments increased LE pain to 10/10 post test                        Objective measurements completed on examination: See above findings.       [facility] Adult PT Treatment/Exercise - 03/19/21 0001        Exercises   Exercises Knee/Hip      Knee/Hip Exercises: Seated   Long Arc Quad 5 reps;Both    Sit to Sand 10 reps;without UE support  PT Education - 03/19/21 1504     Education Details eval findings, HEP, POC    Person(s) Educated Patient    Methods Explanation;Demonstration;Handout    Comprehension Verbalized understanding;Returned demonstration              PT Short Term Goals - 03/19/21 1626       PT SHORT TERM GOAL #1   Title Pt will be independent with initial HEP for improve strength and carryover    Baseline initial HEP given    Time 3    Period Weeks    Status New    Target Date 04/09/21      PT SHORT TERM GOAL #2   Title Pt will self report LE pain no greater than 7/10 at worst in order to improve comfort and functional ability    Baseline 10/10 at worst    Time 3    Period Weeks    Status New    Target Date 04/09/21               PT Long Term Goals - 03/19/21 1628       PT LONG TERM GOAL #1   Title Pt will improve FOTO score to no less than 46% for improve confidence and functional ability    Baseline 42% function    Time 10    Period Weeks    Status New    Target Date 05/28/21      PT LONG TERM GOAL #2   Title Pt will increase reps in 30 Sec STS to no less than 14 in order to improve functional mobility    Baseline 10 reps - increased LE pain    Time 10    Period Weeks    Status New    Target Date 05/28/21      PT LONG TERM GOAL #3   Title Pt will be amb 1260ft in 6MWT with no increase in LE pain in order to show improved functional mobility and comfort with community navigation    Baseline 1137ft with 10/10 LE pain post test    Time 10    Period Weeks    Status New    Target Date 05/28/21                    Plan - 03/19/21 1640     Clinical Impression Statement Pt is a pleasant 55 y/o F who presents to PT with chronic LE pain secondary to intermittent claudication and PAD. Physical  findings are consistent with MD impression, as pt demonstrated adequate strength in LEs, but had increased pain with increased activity level with walking and standing activity. Her FOTO score indicates she is operating below her PLOF and would benefit from skilled PT services in order to improve activity tolerance and endurance. Pt will assess response to initial HEP and progress endurance as tolerated, with eventual progression of incorporated walking program.    Personal Factors and Comorbidities Comorbidity 3+;Fitness;Time since onset of injury/illness/exacerbation    Comorbidities HTN, DM II, CHF, PAD    Examination-Activity Limitations Carry;Caring for Others;Locomotion Level;Squat;Stairs;Stand;Lift    Examination-Participation Restrictions Yard Work;Shop;Volunteer;Occupation;Driving;Community Activity    Stability/Clinical Decision Making Stable/Uncomplicated    Clinical Decision Making Low    Rehab Potential Fair   due to long history of PAD and CHF   PT Frequency 2x / week    PT Duration Other (comment)   10 weeks   PT Treatment/Interventions ADLs/Self Care Home Management;Moist Heat;Cryotherapy;Gait training;Stair training;Functional  mobility training;Therapeutic activities;Therapeutic exercise;Balance training;Neuromuscular re-education;Patient/family education;Manual techniques    PT Next Visit Plan review FOTO; assess HEP response; progress strength/endurance while monitoring vitals    PT Home Exercise Plan Access Code: [email or web address]    Consulted and Agree with Plan of Care Patient             Patient will benefit from skilled therapeutic intervention in order to improve the following deficits and impairments:  Abnormal gait, Cardiopulmonary status limiting activity, Decreased activity tolerance, Decreased balance, Decreased endurance, Decreased mobility, Decreased strength, Impaired sensation, Pain  Visit Diagnosis: Muscle weakness (generalized) - Plan: PT plan of care  cert/re-cert  Other abnormalities of gait and mobility - Plan: PT plan of care cert/re-cert  Pain in left leg - Plan: PT plan of care cert/re-cert  Pain in right leg - Plan: PT plan of care cert/re-cert     Problem List Patient Active Problem List   Diagnosis Date Noted   Diabetic polyneuropathy associated with type 2 diabetes mellitus ([place]) 12/27/2020   Routine screening for STI (sexually transmitted infection) 08/07/2020   (HFpEF) heart failure with preserved ejection fraction ([place]) 06/12/2020   Meralgia paraesthetica 11/10/2018   Bipolar affective disorder, depressed ([place]) 08/27/2018   Type 2 diabetes mellitus without complication, without long-term current use of insulin ([place]) 01/27/2018   COPD (chronic obstructive pulmonary disease) ([place]) 12/29/2016   Mixed hyperlipidemia 12/29/2016   Gastro-esophageal reflux disease without esophagitis 08/09/2016   Carotid bruit 08/09/2016   PAD (peripheral artery disease) ([place]) 02/25/2016   Obstructive sleep apnea, adult 02/07/2016   HTN, goal below 140/90 01/23/2016   Atherosclerosis with limb claudication ([place]) 01/23/2016   Claudication in peripheral vascular disease ([place]) 01/06/2016   Hearing loss 11/27/2015   Morbid obesity ([place]) 10/29/2015   Excessive daytime sleepiness 10/05/2015   History of depression 10/05/2015   Chronic diarrhea    Urinary incontinence 04/08/2015   Bilateral chronic knee pain 12/07/2014   IBS (irritable bowel syndrome)    Chronic back pain 05/14/2014    Ward Chatters, PT, DPT 03/19/21 4:46 PM  [facility] [address] [place], [place], [address] Phone: [phone]   Fax:  [phone]  Name: Elizabeth Diaz MRN: [id number] Date of Birth: 09/12/1966

## 2021-03-24 ENCOUNTER — Ambulatory Visit: Payer: Medicare Other

## 2021-03-24 ENCOUNTER — Telehealth: Payer: Self-pay

## 2021-03-24 NOTE — Telephone Encounter (Signed)
PT called and spoke with patient who stated she forgot about this appointment. Reminded her of her next visit.  Ward Chatters, PT, DPT 03/24/21 3:20 PM

## 2021-03-26 ENCOUNTER — Ambulatory Visit: Payer: Medicare Other

## 2021-03-26 ENCOUNTER — Other Ambulatory Visit: Payer: Self-pay

## 2021-03-26 DIAGNOSIS — M6281 Muscle weakness (generalized): Secondary | ICD-10-CM | POA: Diagnosis not present

## 2021-03-26 DIAGNOSIS — R2689 Other abnormalities of gait and mobility: Secondary | ICD-10-CM

## 2021-03-26 DIAGNOSIS — M79605 Pain in left leg: Secondary | ICD-10-CM | POA: Diagnosis not present

## 2021-03-26 DIAGNOSIS — M79604 Pain in right leg: Secondary | ICD-10-CM

## 2021-03-26 NOTE — Therapy (Signed)
[place], [place], [address] Phone: [phone]   Fax:  [phone]  Physical Therapy Treatment  Patient Details  Name: QUANTIA GRULLON MRN: [id number] Date of Birth: [birth year] Referring Provider (PT): Lind Covert, MD   Encounter Date: 03/26/2021   PT End of Session - 03/26/21 1412     Visit Number 2    Number of Visits 21    Date for PT Re-Evaluation 05/28/21    Authorization Type UHC MCR and MCD secondary - FOTO 6th and 10th    Progress Note Due on Visit 10    PT Start Time 1410   arrived late   PT Stop Time 1442    PT Time Calculation (min) 32 min    Activity Tolerance Patient tolerated treatment well;Patient limited by fatigue;Patient limited by pain    Behavior During Therapy [facility] for tasks assessed/performed             Past Medical History:  Diagnosis Date   Arthritis    Benign neoplasm of rectum    Chronic pain    Congestive heart failure (CHF) (HCC)    COPD (chronic obstructive pulmonary disease) ([place])    Diabetes mellitus ([place])    Diverticulosis    GERD (gastroesophageal reflux disease) 12/29/2016   HTN (hypertension) 12/29/2016   Hyperplastic colon polyp    Lower extremity edema 12/12/2015   Numbness    left leg   Obesity    Pneumothorax    Schatzki's ring    Screen for sexually transmitted diseases 08/07/2020   Seizures ([place])    hx of, no seizures last few years   Type 2 diabetes mellitus ([place]) 01/27/2018   UTI (urinary tract infection) 01/29/2020    Past Surgical History:  Procedure Laterality Date   CHEST TUBE INSERTION     COLONOSCOPY WITH PROPOFOL N/A 11/01/2014   Procedure: COLONOSCOPY WITH PROPOFOL;  Surgeon: Jerene Bears, MD;  [facility];  Service: Gastroenterology;  Laterality: N/A;   ESOPHAGOGASTRODUODENOSCOPY (EGD) WITH PROPOFOL N/A 07/29/2015   Procedure: ESOPHAGOGASTRODUODENOSCOPY (EGD) WITH PROPOFOL;  Surgeon: Jerene Bears, MD;  [facility];  Service: Gastroenterology;  Laterality: N/A;   PERIPHERAL VASCULAR CATHETERIZATION N/A 01/07/2016   Procedure: Lower Extremity Angiography;  Surgeon: Adrian Prows, MD;  Location: [place] [facility];  Service: Cardiovascular;  Laterality: N/A;   PERIPHERAL VASCULAR CATHETERIZATION Right 02/25/2016   Procedure: Peripheral Vascular Atherectomy;  Surgeon: Adrian Prows, MD;  Location: [place] [facility];  Service: Cardiovascular;  Laterality: Right;  SFA ATHERECTOMY/DRUG COATED PTA   TUBAL LIGATION      Vitals:     Subjective Assessment - 03/26/21 1413     Subjective Pt presents to PT with continued reports of LE pain, especially when walking. She has not yet been compliant with her HEP to this point. She is ready to begin PT at this time.    Pain Score 7     Pain Location Leg    Pain Orientation Right;Left                               [facility] Adult PT Treatment/Exercise - 03/26/21 0001       Knee/Hip Exercises: Aerobic   Nustep lvl 5 LE only x 3 min      Knee/Hip Exercises: Machines for Strengthening   Total Gym Leg Press 3x10 75lb      Knee/Hip Exercises:  Standing   Functional Squat Limitations x 10 holding freemotion handle      Knee/Hip Exercises: Seated   Sit to Sand 2 sets;10 reps;without UE support      Knee/Hip Exercises: Supine   Bridges 2 sets;10 reps    Straight Leg Raises 2 sets;10 reps;Both    Other Supine Knee/Hip Exercises clamshell 2x15                      PT Short Term Goals - 03/19/21 1626       PT SHORT TERM GOAL #1   Title Pt will be independent with initial HEP for improve strength and carryover    Baseline initial HEP given    Time 3    Period Weeks    Status New    Target Date 04/09/21      PT SHORT TERM GOAL #2   Title Pt will self report LE pain no greater than 7/10 at worst in order to improve comfort and functional ability    Baseline 10/10 at worst    Time 3    Period Weeks    Status New    Target  Date 04/09/21               PT Long Term Goals - 03/19/21 1628       PT LONG TERM GOAL #1   Title Pt will improve FOTO score to no less than 46% for improve confidence and functional ability    Baseline 42% function    Time 10    Period Weeks    Status New    Target Date 05/28/21      PT LONG TERM GOAL #2   Title Pt will increase reps in 30 Sec STS to no less than 14 in order to improve functional mobility    Baseline 10 reps - increased LE pain    Time 10    Period Weeks    Status New    Target Date 05/28/21      PT LONG TERM GOAL #3   Title Pt will be amb 1262ft in 6MWT with no increase in LE pain in order to show improved functional mobility and comfort with community navigation    Baseline 1151ft with 10/10 LE pain post test    Time 10    Period Weeks    Status New    Target Date 05/28/21                   Plan - 03/26/21 1538     Clinical Impression Statement Pt was able to complete prescribed exercises with no adverse effect. She noted bilateral LE fatigue post session, but no increase in pain. Vitals remained stable throughout session. Will continue to progress as tolerated.    PT Treatment/Interventions ADLs/Self Care Home Management;Moist Heat;Cryotherapy;Gait training;Stair training;Functional mobility training;Therapeutic activities;Therapeutic exercise;Balance training;Neuromuscular re-education;Patient/family education;Manual techniques    PT Next Visit Plan progress strength/endurance while monitoring vitals    PT Home Exercise Plan Access Code: [facility]             Patient will benefit from skilled therapeutic intervention in order to improve the following deficits and impairments:  Abnormal gait, Cardiopulmonary status limiting activity, Decreased activity tolerance, Decreased balance, Decreased endurance, Decreased mobility, Decreased strength, Impaired sensation, Pain  Visit Diagnosis: Muscle weakness (generalized)  Other  abnormalities of gait and mobility  Pain in left leg  Pain in right leg     Problem List  Patient Active Problem List   Diagnosis Date Noted   Diabetic polyneuropathy associated with type 2 diabetes mellitus ([place]) 12/27/2020   Routine screening for STI (sexually transmitted infection) 08/07/2020   (HFpEF) heart failure with preserved ejection fraction ([place]) 06/12/2020   Meralgia paraesthetica 11/10/2018   Bipolar affective disorder, depressed ([place]) 08/27/2018   Type 2 diabetes mellitus without complication, without long-term current use of insulin ([place]) 01/27/2018   COPD (chronic obstructive pulmonary disease) ([place]) 12/29/2016   Mixed hyperlipidemia 12/29/2016   Gastro-esophageal reflux disease without esophagitis 08/09/2016   Carotid bruit 08/09/2016   PAD (peripheral artery disease) ([place]) 02/25/2016   Obstructive sleep apnea, adult 02/07/2016   HTN, goal below 140/90 01/23/2016   Atherosclerosis with limb claudication ([place]) 01/23/2016   Claudication in peripheral vascular disease ([place]) 01/06/2016   Hearing loss 11/27/2015   Morbid obesity ([place]) 10/29/2015   Excessive daytime sleepiness 10/05/2015   History of depression 10/05/2015   Chronic diarrhea    Urinary incontinence 04/08/2015   Bilateral chronic knee pain 12/07/2014   IBS (irritable bowel syndrome)    Chronic back pain 05/14/2014    Ward Chatters, PT, DPT 03/26/21 3:44 PM  [facility] [address] [place], [place], [address] Phone: [phone]   Fax:  [phone]  Name: ERMAL BRZOZOWSKI MRN: [id number] Date of Birth: [birth year]

## 2021-04-01 ENCOUNTER — Other Ambulatory Visit: Payer: Self-pay

## 2021-04-01 DIAGNOSIS — E119 Type 2 diabetes mellitus without complications: Secondary | ICD-10-CM

## 2021-04-01 NOTE — Telephone Encounter (Signed)
Refill request for 90 day supply.  Ottis Stain, CMA

## 2021-04-02 ENCOUNTER — Ambulatory Visit: Payer: Medicare Other | Attending: Family Medicine

## 2021-04-02 ENCOUNTER — Telehealth: Payer: Self-pay

## 2021-04-02 DIAGNOSIS — R2689 Other abnormalities of gait and mobility: Secondary | ICD-10-CM | POA: Insufficient documentation

## 2021-04-02 DIAGNOSIS — M79604 Pain in right leg: Secondary | ICD-10-CM | POA: Insufficient documentation

## 2021-04-02 DIAGNOSIS — M79605 Pain in left leg: Secondary | ICD-10-CM | POA: Insufficient documentation

## 2021-04-02 DIAGNOSIS — M6281 Muscle weakness (generalized): Secondary | ICD-10-CM | POA: Insufficient documentation

## 2021-04-02 NOTE — Telephone Encounter (Signed)
Called and spoke with patient regarding missed visit. Reminded her of attendance policy and that she can only schedule one at a time. She confirmed she would be at next appointment.  Ward Chatters, PT, DPT 04/02/21 6:07 PM

## 2021-04-03 MED ORDER — OZEMPIC (0.25 OR 0.5 MG/DOSE) 2 MG/1.5ML ~~LOC~~ SOPN: 0.5000 mg | PEN_INJECTOR | Freq: Every day | SUBCUTANEOUS | 2 refills | Status: DC

## 2021-04-07 ENCOUNTER — Ambulatory Visit: Payer: Medicare Other

## 2021-04-07 ENCOUNTER — Other Ambulatory Visit: Payer: Self-pay

## 2021-04-07 DIAGNOSIS — M79605 Pain in left leg: Secondary | ICD-10-CM

## 2021-04-07 DIAGNOSIS — M6281 Muscle weakness (generalized): Secondary | ICD-10-CM

## 2021-04-07 DIAGNOSIS — M79604 Pain in right leg: Secondary | ICD-10-CM

## 2021-04-07 DIAGNOSIS — R2689 Other abnormalities of gait and mobility: Secondary | ICD-10-CM | POA: Diagnosis not present

## 2021-04-07 NOTE — Therapy (Addendum)
[place], [place], [address] Phone: [phone]   Fax:  [phone]  Physical Therapy Treatment/Discharge  Patient Details  Name: Elizabeth Diaz MRN: [id number] Date of Birth: [birth year] Referring Provider (PT): Lind Covert, MD   Encounter Date: 04/07/2021   PT End of Session - 04/07/21 1217     Visit Number 3    Number of Visits 21    Date for PT Re-Evaluation 05/28/21    Authorization Type U[employer] MCR and MCD secondary - FOTO 6th and 10th    Progress Note Due on Visit 10    PT Start Time 1215    PT Stop Time 1255    PT Time Calculation (min) 40 min    Activity Tolerance Patient tolerated treatment well;Patient limited by fatigue;Patient limited by pain    Behavior During Therapy [facility] for tasks assessed/performed             Past Medical History:  Diagnosis Date   Arthritis    Benign neoplasm of rectum    Chronic pain    Congestive heart failure (CHF) (HCC)    COPD (chronic obstructive pulmonary disease) ([place])    Diabetes mellitus ([place])    Diverticulosis    GERD (gastroesophageal reflux disease) 12/29/2016   HTN (hypertension) 12/29/2016   Hyperplastic colon polyp    Lower extremity edema 12/12/2015   Numbness    left leg   Obesity    Pneumothorax    Schatzki's ring    Screen for sexually transmitted diseases 08/07/2020   Seizures ([place])    hx of, no seizures last few years   Type 2 diabetes mellitus ([place]) 01/27/2018   UTI (urinary tract infection) 01/29/2020    Past Surgical History:  Procedure Laterality Date   CHEST TUBE INSERTION     COLONOSCOPY WITH PROPOFOL N/A 11/01/2014   Procedure: COLONOSCOPY WITH PROPOFOL;  Surgeon: Jerene Bears, MD;  Location: [facility];  Service: Gastroenterology;  Laterality: N/A;   ESOPHAGOGASTRODUODENOSCOPY (EGD) WITH PROPOFOL N/A 07/29/2015   Procedure: ESOPHAGOGASTRODUODENOSCOPY (EGD) WITH PROPOFOL;  Surgeon: Jerene Bears, MD;  Location: [facility];  Service: Gastroenterology;  Laterality: N/A;   PERIPHERAL VASCULAR CATHETERIZATION N/A 01/07/2016   Procedure: Lower Extremity Angiography;  Surgeon: Adrian Prows, MD;  Location: [place] [facility];  Service: Cardiovascular;  Laterality: N/A;   PERIPHERAL VASCULAR CATHETERIZATION Right 02/25/2016   Procedure: Peripheral Vascular Atherectomy;  Surgeon: Adrian Prows, MD;  Location: [place] [facility];  Service: Cardiovascular;  Laterality: Right;  SFA ATHERECTOMY/DRUG COATED PTA   TUBAL LIGATION      There were no vitals filed for this visit.   Subjective Assessment - 04/07/21 1217     Subjective Pt presents to PT with no reports of LE pain. She does note some bil shoulder discomfort, which she contributes to the weather. Pt is ready to begin PT at this time.    Currently in Pain? Yes    Pain Score 8     Pain Location Shoulder    Pain Orientation Right;Left                               [facility] Adult PT Treatment/Exercise - 04/07/21 0001       Exercises   Exercises Lumbar      Lumbar Exercises: Supine   Pelvic Tilt 10 reps;5 seconds      Knee/Hip Exercises: Aerobic   Nustep  lvl 5 x 5 min while taking subjective      Knee/Hip Exercises: Standing   Hip Abduction 10 reps;Both    Hip Extension 10 reps;Both    Functional Squat Limitations x 10 holding freemotion handle      Knee/Hip Exercises: Seated   Long Arc Quad 2 sets;10 reps    Long Arc Quad Weight 2 lbs.    Ball Squeeze 2x10 - 5 sec hold    Sit to [employer] 2 sets;10 reps;without UE support      Knee/Hip Exercises: Supine   Bridges 2 sets;10 reps    Straight Leg Raises 2 sets;10 reps;Both                      PT Short Term Goals - 03/19/21 1626       PT SHORT TERM GOAL #1   Title Pt will be independent with initial HEP for improve strength and carryover    Baseline initial HEP given    Time 3    Period Weeks    Status New    Target Date 04/09/21      PT SHORT TERM GOAL #2    Title Pt will self report LE pain no greater than 7/10 at worst in order to improve comfort and functional ability    Baseline 10/10 at worst    Time 3    Period Weeks    Status New    Target Date 04/09/21               PT Long Term Goals - 03/19/21 1628       PT LONG TERM GOAL #1   Title Pt will improve FOTO score to no less than 46% for improve confidence and functional ability    Baseline 42% function    Time 10    Period Weeks    Status New    Target Date 05/28/21      PT LONG TERM GOAL #2   Title Pt will increase reps in 30 Sec STS to no less than 14 in order to improve functional mobility    Baseline 10 reps - increased LE pain    Time 10    Period Weeks    Status New    Target Date 05/28/21      PT LONG TERM GOAL #3   Title Pt will be amb 1256ft in 6MWT with no increase in LE pain in order to show improved functional mobility and comfort with community navigation    Baseline 1133ft with 10/10 LE pain post test    Time 10    Period Weeks    Status New    Target Date 05/28/21                   Plan - 04/07/21 1342     Clinical Impression Statement Pt was able to complete prescribed exercises, but noted occasional increases in LE pain during activity. Pt continues to show decreased LE strength and endurance and LE pain related to intermittent claudication. She continues to benefit from skilled PT working on improving endurnace in order to decrease pain and improve function. Will continue to progress as able per POC/    PT Treatment/Interventions ADLs/Self Care Home Management;Moist Heat;Cryotherapy;Gait training;Stair training;Functional mobility training;Therapeutic activities;Therapeutic exercise;Balance training;Neuromuscular re-education;Patient/family education;Manual techniques    PT Next Visit Plan progress strength/endurance while monitoring vitals    PT Home Exercise Plan Access Code: [facility]  Patient will benefit from skilled  therapeutic intervention in order to improve the following deficits and impairments:  Abnormal gait, Cardiopulmonary status limiting activity, Decreased activity tolerance, Decreased balance, Decreased endurance, Decreased mobility, Decreased strength, Impaired sensation, Pain  Visit Diagnosis: Muscle weakness (generalized)  Other abnormalities of gait and mobility  Pain in left leg  Pain in right leg     Problem List Patient Active Problem List   Diagnosis Date Noted   Diabetic polyneuropathy associated with type 2 diabetes mellitus ([place]) 12/27/2020   Routine screening for STI (sexually transmitted infection) 08/07/2020   (HFpEF) heart failure with preserved ejection fraction ([place]) 06/12/2020   Meralgia paraesthetica 11/10/2018   Bipolar affective disorder, depressed ([place]) 08/27/2018   Type 2 diabetes mellitus without complication, without long-term current use of insulin ([place]) 01/27/2018   COPD (chronic obstructive pulmonary disease) ([place]) 12/29/2016   Mixed hyperlipidemia 12/29/2016   Gastro-esophageal reflux disease without esophagitis 08/09/2016   Carotid bruit 08/09/2016   PAD (peripheral artery disease) ([place]) 02/25/2016   Obstructive sleep apnea, adult 02/07/2016   HTN, goal below 140/90 01/23/2016   Atherosclerosis with limb claudication ([place]) 01/23/2016   Claudication in peripheral vascular disease ([place]) 01/06/2016   Hearing loss 11/27/2015   Morbid obesity ([place]) 10/29/2015   Excessive daytime sleepiness 10/05/2015   History of depression 10/05/2015   Chronic diarrhea    Urinary incontinence 04/08/2015   Bilateral chronic knee pain 12/07/2014   IBS (irritable bowel syndrome)    Chronic back pain 05/14/2014    Ward Chatters, PT, DPT 04/07/21 1:44 PM  [facility] [address] [place], [place], [address] Phone: [phone]   Fax:  [phone]  Name: Elizabeth Diaz MRN: [id number] Date of Birth:  1966-01-21  PHYSICAL THERAPY DISCHARGE SUMMARY  Visits from Start of Care: 3  Current functional level related to goals / functional outcomes: Unable to assess   Remaining deficits: Unable to assess   Education / Equipment: Unable to assess   Patient agrees to discharge. Patient goals were  Unable to assess . Patient is being discharged due to not returning since the last visit.

## 2021-04-08 ENCOUNTER — Ambulatory Visit: Payer: Medicare Other | Admitting: Family Medicine

## 2021-04-10 ENCOUNTER — Telehealth: Payer: Self-pay

## 2021-04-10 DIAGNOSIS — E119 Type 2 diabetes mellitus without complications: Secondary | ICD-10-CM

## 2021-04-10 NOTE — Telephone Encounter (Signed)
Received fax from pharmacy.  Ozempic,0.25 or 0.5) "Please verify sig dosed weekly not daily." Resend Rx if change to weekly. Ottis Stain, CMA

## 2021-04-11 MED ORDER — OZEMPIC (0.25 OR 0.5 MG/DOSE) 2 MG/1.5ML ~~LOC~~ SOPN
0.5000 mg | PEN_INJECTOR | SUBCUTANEOUS | 0 refills | Status: DC
Start: 2021-04-11 — End: 2021-07-07

## 2021-04-11 NOTE — Telephone Encounter (Signed)
Changed the ozempic to weekly dosing, as is correct. Dispense 90 days worth, 0 refills to ensure follow up with PCP.   Ezequiel Essex, MD

## 2021-04-15 ENCOUNTER — Ambulatory Visit: Payer: Medicare Other

## 2021-04-18 ENCOUNTER — Ambulatory Visit: Payer: Medicare Other | Admitting: Family Medicine

## 2021-04-19 ENCOUNTER — Telehealth: Payer: Self-pay

## 2021-04-19 NOTE — Telephone Encounter (Signed)
LVM re: no show appt. Pt has no more appts scheduled. Pt advised she may call to schedule an appt, but if no appt is scheduled in a month then she will be Dced from PT services.

## 2021-05-13 ENCOUNTER — Other Ambulatory Visit: Payer: Self-pay | Admitting: Family Medicine

## 2021-05-13 DIAGNOSIS — R600 Localized edema: Secondary | ICD-10-CM

## 2021-05-13 DIAGNOSIS — I739 Peripheral vascular disease, unspecified: Secondary | ICD-10-CM

## 2021-05-13 DIAGNOSIS — I70219 Atherosclerosis of native arteries of extremities with intermittent claudication, unspecified extremity: Secondary | ICD-10-CM

## 2021-05-27 ENCOUNTER — Other Ambulatory Visit: Payer: Self-pay

## 2021-05-27 ENCOUNTER — Ambulatory Visit (HOSPITAL_COMMUNITY)
Admission: EM | Admit: 2021-05-27 | Discharge: 2021-05-27 | Disposition: A | Discharge status: Home / Self Care | Payer: Medicare Other | Attending: Physician Assistant | Admitting: Physician Assistant

## 2021-05-27 ENCOUNTER — Encounter (HOSPITAL_COMMUNITY): Payer: Self-pay | Admitting: *Deleted

## 2021-05-27 DIAGNOSIS — R059 Cough, unspecified: Secondary | ICD-10-CM | POA: Insufficient documentation

## 2021-05-27 DIAGNOSIS — Z20822 Contact with and (suspected) exposure to covid-19: Secondary | ICD-10-CM | POA: Insufficient documentation

## 2021-05-27 DIAGNOSIS — M791 Myalgia, unspecified site: Secondary | ICD-10-CM | POA: Insufficient documentation

## 2021-05-27 DIAGNOSIS — R5383 Other fatigue: Secondary | ICD-10-CM | POA: Diagnosis not present

## 2021-05-27 NOTE — Discharge Instructions (Signed)
Remain in isolation pending COVID test results.  

## 2021-05-27 NOTE — ED Provider Notes (Signed)
[place]    CSN: [id number] Arrival date & time: 05/27/21  1552      History   Chief Complaint Chief Complaint  Patient presents with   Chills   Generalized Body Aches    HPI Elizabeth Diaz is a 55 y.o. female.   Patient here c/w fatigue x 1 day.  Admits fatigue, chills, myalgia.  Denies fever, otalgia, n/v/d, abdominal pain, wheezing, SOB.  She was exposed to [employer], she has had COVID vaccines.   Past Medical History:  Diagnosis Date   Arthritis    Benign neoplasm of rectum    Chronic pain    Congestive heart failure (CHF) (HCC)    COPD (chronic obstructive pulmonary disease) ([place])    Diabetes mellitus (HCC)    Diverticulosis    GERD (gastroesophageal reflux disease) 12/29/2016   HTN (hypertension) 12/29/2016   Hyperplastic colon polyp    Lower extremity edema 12/12/2015   Numbness    left leg   Obesity    Pneumothorax    Schatzki's ring    Screen for sexually transmitted diseases 08/07/2020   Seizures ([place])    hx of, no seizures last few years   Type 2 diabetes mellitus ([place]) 01/27/2018   UTI (urinary tract infection) 01/29/2020    Patient Active Problem List   Diagnosis Date Noted   Diabetic polyneuropathy associated with type 2 diabetes mellitus ([place]) 12/27/2020   Routine screening for STI (sexually transmitted infection) 08/07/2020   (HFpEF) heart failure with preserved ejection fraction ([place]) 06/12/2020   Meralgia paraesthetica 11/10/2018   Bipolar affective disorder, depressed ([place]) 08/27/2018   Type 2 diabetes mellitus without complication, without long-term current use of insulin ([place]) 01/27/2018   COPD (chronic obstructive pulmonary disease) ([place]) 12/29/2016   Mixed hyperlipidemia 12/29/2016   Gastro-esophageal reflux disease without esophagitis 08/09/2016   Carotid bruit 08/09/2016   PAD (peripheral artery disease) ([place]) 02/25/2016   Obstructive sleep apnea, adult 02/07/2016   HTN, goal below 140/90 01/23/2016   Atherosclerosis with limb  claudication ([place]) 01/23/2016   Claudication in peripheral vascular disease ([place]) 01/06/2016   Hearing loss 11/27/2015   Morbid obesity ([place]) 10/29/2015   Excessive daytime sleepiness 10/05/2015   History of depression 10/05/2015   Chronic diarrhea    Urinary incontinence 04/08/2015   Bilateral chronic knee pain 12/07/2014   IBS (irritable bowel syndrome)    Chronic back pain 05/14/2014    Past Surgical History:  Procedure Laterality Date   CHEST TUBE INSERTION     COLONOSCOPY WITH PROPOFOL N/A 11/01/2014   Procedure: COLONOSCOPY WITH PROPOFOL;  Surgeon: Jerene Bears, MD;  Location: [facility];  Service: Gastroenterology;  Laterality: N/A;   ESOPHAGOGASTRODUODENOSCOPY (EGD) WITH PROPOFOL N/A 07/29/2015   Procedure: ESOPHAGOGASTRODUODENOSCOPY (EGD) WITH PROPOFOL;  Surgeon: Jerene Bears, MD;  Location: [facility];  Service: Gastroenterology;  Laterality: N/A;   PERIPHERAL VASCULAR CATHETERIZATION N/A 01/07/2016   Procedure: Lower Extremity Angiography;  Surgeon: Adrian Prows, MD;  Location: [place] [facility];  Service: Cardiovascular;  Laterality: N/A;   PERIPHERAL VASCULAR CATHETERIZATION Right 02/25/2016   Procedure: Peripheral Vascular Atherectomy;  Surgeon: Adrian Prows, MD;  Location: [place] [facility];  Service: Cardiovascular;  Laterality: Right;  SFA ATHERECTOMY/DRUG COATED PTA   TUBAL LIGATION      OB History   No obstetric history on file.      Home Medications    Prior to Admission medications   Medication Sig Start Date End Date Taking? Authorizing Provider  albuterol (ACCUNEB)  1.25 MG/3ML nebulizer solution Take 3 mLs (1.25 mg total) by nebulization every 6 (six) hours as needed. 11/08/18   Shirley, [place], DO  aspirin 81 MG chewable tablet Chew 81 mg by mouth daily.    [provider]  atorvastatin (LIPITOR) 80 MG tablet Take 1 tablet (80 mg total) by mouth daily. 06/12/20   Ezequiel Essex, MD  baclofen (LIORESAL) 10 MG tablet TAKE 1 TABLET BY MOUTH THREE TIMES  DAILY 05/14/21   Ezequiel Essex, MD  blood glucose meter kit and supplies KIT Dispense based on patient and insurance preference. Use once daily as directed. Dx code E11.9. 11/30/18   Zenia Resides, MD  Blood Glucose Monitoring Suppl ([place]) w/Device KIT Use glucometer to test sugar. DX: E11.9 02/01/20   Shirley, [place], DO  budesonide-formoterol (SYMBICORT) 80-4.5 MCG/ACT inhaler Inhale 2 puffs into the lungs 2 (two) times daily. 11/08/18   Shirley, [place], DO  furosemide (LASIX) 40 MG tablet TAKE 1/2 (ONE-HALF) TABLET BY MOUTH ONCE DAILY AS NEEDED 05/14/21   Ezequiel Essex, MD  gabapentin (NEURONTIN) 300 MG capsule 300 mg in the morning, 600 mg at bedtime. 03/07/21   Ezequiel Essex, MD  glucose blood [facility] VERIO) test strip Use as instructed 01/29/20   Shirley, [place], DO  hydrOXYzine (ATARAX/VISTARIL) 25 MG tablet Take 1 tablet (25 mg total) by mouth every 4 (four) hours as needed. 09/13/20   Ezequiel Essex, MD  Lancets Misc. (ACCU-CHEK SOFTCLIX LANCET DEV) KIT Use as instructed to test sugars once daily.  Dx code:E11.9 11/30/18   Zenia Resides, MD  ondansetron (ZOFRAN ODT) 4 MG disintegrating tablet Take 1 tablet (4 mg total) by mouth every 8 (eight) hours as needed for nausea or vomiting. 12/18/19   Shirley, [place], DO  OneTouch Delica Lancets 32R MISC 1 Units by Does not apply route daily. 01/29/20   Shirley, [place], DO  oxymetazoline (AFRIN NASAL SPRAY) 0.05 % nasal spray Place 1 spray into both nostrils daily as needed for congestion. 03/07/21   Ezequiel Essex, MD  Semaglutide,0.25 or 0.5MG/DOS, (OZEMPIC, 0.25 OR 0.5 MG/DOSE,) 2 MG/1.5ML SOPN Inject 0.5 mg as directed once a week. 04/11/21 07/10/21  Ezequiel Essex, MD    Family History Family History  Problem Relation Age of Onset   Heart failure Father    Diabetes Father    Diabetes Maternal Grandmother    Diabetes Paternal Grandfather    Parkinson's disease Paternal Grandfather    Cancer Maternal Grandfather         unknown type    Social History Social History   Tobacco Use   Smoking status: Some Days    Years: 0.00    Types: Cigarettes    Last attempt to quit: 05/04/2009    Years since quitting: 12.0   Smokeless tobacco: Never   Tobacco comments:    Smokes about 2 cigs / week  Vaping Use   Vaping Use: Never used  Substance Use Topics   Alcohol use: Yes    Comment: occasionally   Drug use: Not Currently    Types: Cocaine, Marijuana    Comment: 20 year history of crack cocaine. Haven't used any in two years.      Allergies   Patient has no known allergies.   Review of Systems Review of Systems  Constitutional:  Positive for chills and fatigue. Negative for fever.  HENT:  Negative for congestion, ear pain, nosebleeds, postnasal drip, rhinorrhea, sinus pressure, sinus pain and sore throat.   Eyes:  Negative  for pain and redness.  Respiratory:  Positive for cough. Negative for shortness of breath and wheezing.   Gastrointestinal:  Negative for abdominal pain, diarrhea, nausea and vomiting.  Musculoskeletal:  Positive for myalgias. Negative for arthralgias.  Skin:  Negative for rash.  Neurological:  Negative for light-headedness and headaches.  Hematological:  Negative for adenopathy. Does not bruise/bleed easily.  Psychiatric/Behavioral:  Negative for confusion and sleep disturbance.     Physical Exam Triage Vital Signs ED Triage Vitals  Enc Vitals Group     BP 05/27/21 1652 (!) 171/90     Pulse Rate 05/27/21 1652 69     Resp 05/27/21 1652 20     Temp 05/27/21 1652 98.8 F (37.1 C)     Temp src --      SpO2 05/27/21 1652 100 %     Weight --      Height --      Head Circumference --      Peak Flow --      Pain Score 05/27/21 1653 8     Pain Loc --      Pain Edu? --      Excl. in [place]? --    No data found.  Updated Vital Signs BP (!) 171/90   Pulse 69   Temp 98.8 F (37.1 C)   Resp 20   LMP 12/03/2016 (Approximate)   SpO2 100%   Visual Acuity Right Eye  Distance:   Left Eye Distance:   Bilateral Distance:    Right Eye Near:   Left Eye Near:    Bilateral Near:     Physical Exam Vitals and nursing note reviewed.  Constitutional:      General: She is not in acute distress.    Appearance: Normal appearance. She is not ill-appearing.  HENT:     Head: Normocephalic and atraumatic.     Right Ear: Tympanic membrane and ear canal normal.     Left Ear: Tympanic membrane and ear canal normal.     Nose: No congestion or rhinorrhea.     Mouth/Throat:     Pharynx: No oropharyngeal exudate or posterior oropharyngeal erythema.  Eyes:     General: No scleral icterus.    Extraocular Movements: Extraocular movements intact.     Conjunctiva/sclera: Conjunctivae normal.  Cardiovascular:     Rate and Rhythm: Normal rate and regular rhythm.     Heart sounds: No murmur heard. Pulmonary:     Effort: Pulmonary effort is normal. No respiratory distress.     Breath sounds: Normal breath sounds. No wheezing or rales.  Musculoskeletal:     Cervical back: Normal range of motion. No rigidity.  Skin:    Coloration: Skin is not jaundiced.     Findings: No rash.  Neurological:     General: No focal deficit present.     Mental Status: She is alert and oriented to person, place, and time.     Motor: No weakness.     Gait: Gait normal.  Psychiatric:        Mood and Affect: Mood normal.        Behavior: Behavior normal.     UC Treatments / Results  Labs (all labs ordered are listed, but only abnormal results are displayed) Labs Reviewed  SARS CORONAVIRUS 2 (TAT 6-24 HRS)    EKG   Radiology No results found.  Procedures Procedures (including critical care time)  Medications Ordered in UC Medications - No data to display  Initial Impression /  Assessment and Plan / UC Course  I have reviewed the triage vital signs and the nursing notes.  Pertinent labs & imaging results that were available during my care of the patient were reviewed by me  and considered in my medical decision making (see chart for details).     Lab results available in 24 - 48 hours on mychart. Follow up 1 week PRN if no improvement. Final Clinical Impressions(s) / UC Diagnoses   Final diagnoses:  Cough  Fatigue, unspecified type  Myalgia     Discharge Instructions      Remain in isolation pending COVID test results     ED Prescriptions   None    PDMP not reviewed this encounter.   Peri Jefferson, PA-C 05/27/21 [phone]

## 2021-05-27 NOTE — ED Triage Notes (Signed)
Pt reports she was at a house for a friend and multiple people in home were positive for COVID.Pt has chills and body aches.

## 2021-05-28 LAB — SARS CORONAVIRUS 2 (TAT 6-24 HRS): SARS Coronavirus 2: NEGATIVE

## 2021-06-09 ENCOUNTER — Other Ambulatory Visit: Payer: Self-pay

## 2021-06-09 ENCOUNTER — Emergency Department (HOSPITAL_COMMUNITY)
Admission: EM | Admit: 2021-06-09 | Discharge: 2021-06-09 | Disposition: A | Discharge status: Home / Self Care | Payer: Medicare Other | Attending: Emergency Medicine | Admitting: Emergency Medicine

## 2021-06-09 ENCOUNTER — Emergency Department (HOSPITAL_COMMUNITY): Payer: Medicare Other

## 2021-06-09 DIAGNOSIS — R61 Generalized hyperhidrosis: Secondary | ICD-10-CM | POA: Insufficient documentation

## 2021-06-09 DIAGNOSIS — R079 Chest pain, unspecified: Secondary | ICD-10-CM | POA: Diagnosis not present

## 2021-06-09 DIAGNOSIS — Z5321 Procedure and treatment not carried out due to patient leaving prior to being seen by health care provider: Secondary | ICD-10-CM | POA: Diagnosis not present

## 2021-06-09 DIAGNOSIS — R0602 Shortness of breath: Secondary | ICD-10-CM | POA: Diagnosis not present

## 2021-06-09 DIAGNOSIS — R0789 Other chest pain: Secondary | ICD-10-CM | POA: Diagnosis not present

## 2021-06-09 LAB — COMPREHENSIVE METABOLIC PANEL
ALT: 16 U/L (ref 0–44)
AST: 12 U/L — ABNORMAL LOW (ref 15–41)
Albumin: 3.2 g/dL — ABNORMAL LOW (ref 3.5–5.0)
Alkaline Phosphatase: 74 U/L (ref 38–126)
Anion gap: 9 (ref 5–15)
BUN: 12 mg/dL (ref 6–20)
CO2: 25 mmol/L (ref 22–32)
Calcium: 8.8 mg/dL — ABNORMAL LOW (ref 8.9–10.3)
Chloride: 103 mmol/L (ref 98–111)
Creatinine, Ser: 0.75 mg/dL (ref 0.44–1.00)
GFR, Estimated: >60 mL/min (ref >60)
Glucose, Bld: 132 mg/dL — ABNORMAL HIGH (ref 70–99)
Potassium: 4 mmol/L (ref 3.5–5.1)
Sodium: 137 mmol/L (ref 135–145)
Total Bilirubin: 0.5 mg/dL (ref 0.3–1.2)
Total Protein: 6.9 g/dL (ref 6.5–8.1)

## 2021-06-09 LAB — CBC
HCT: 43.8 % (ref 36.0–46.0)
Hemoglobin: 14.1 g/dL (ref 12.0–15.0)
MCH: 31.1 pg (ref 26.0–34.0)
MCHC: 32.2 g/dL (ref 30.0–36.0)
MCV: 96.5 fL (ref 80.0–100.0)
Platelets: 258 10*3/uL (ref 150–400)
RBC: 4.54 MIL/uL (ref 3.87–5.11)
RDW: 13.6 % (ref 11.5–15.5)
WBC: 10.7 10*3/uL — ABNORMAL HIGH (ref 4.0–10.5)
nRBC: 0 % (ref 0.0–0.2)

## 2021-06-09 LAB — TROPONIN I (HIGH SENSITIVITY)
Troponin I (High Sensitivity): 7 ng/L (ref <18)
Troponin I (High Sensitivity): 7 ng/L (ref <18)

## 2021-06-09 LAB — D-DIMER, QUANTITATIVE: D-Dimer, Quant: 0.43 ug/mL-FEU (ref 0.00–0.50)

## 2021-06-09 NOTE — ED Triage Notes (Signed)
Pt arrived to POV complaining of CP since Friday. Some shortness of breath and sweating Hx of CHF and DM type2

## 2021-06-09 NOTE — ED Provider Notes (Signed)
Emergency Medicine Provider Triage Evaluation Note  Elizabeth Diaz , a 55 y.o. female  was evaluated in triage.  Pt complains of chest pain. Patient states that she has had chest pain since Friday.  Pain started suddenly while at rest.  He has been constant since then.  Describes it as feeling like something is sitting on her chest.  she has associated shortness of breath.  Pain goes to bilateral shoulders.  She also has generalized weakness and fatigue.  Denies any nausea, vomiting, abdominal pain.   Review of Systems  Positive: Chest pain, shortness of breath, weakness Negative: Nausea, vomiting, abdominal pain, numbness  Physical Exam  BP (!) 173/138 (BP Location: Left Arm)   Pulse 71   Temp 98.5 F (36.9 C) (Oral)   Resp 17   Ht 5' (1.524 m)   Wt 102.1 kg   LMP 12/03/2016 (Approximate)   SpO2 100%   BMI 43.94 kg/m  Gen:   Awake, no distress Resp:  Normal effort MSK:   Moves extremities without difficulty Other:  Lungs clear to auscultation.  Heart sounds normal.  Pain reproducible to palpation of left upper chest and left back.  Notable upper extremity equal weakness.  No focal deficit on exam.  Medical Decision Making  Medically screening exam initiated at 2:25 PM.  Appropriate orders placed.  LANAUTICA WINCHELL was informed that the remainder of the evaluation will be completed by another provider, this initial triage assessment does not replace that evaluation, and the importance of remaining in the ED until their evaluation is complete.   Adolphus Birchwood, PA-C 06/09/21 1432    Jeanell Sparrow, DO 06/09/21 1813

## 2021-06-09 NOTE — ED Notes (Signed)
Pt experiencing increasing chest pain and shortness of breath. Vitals updated. Triage RN notified.

## 2021-06-10 ENCOUNTER — Other Ambulatory Visit: Payer: Self-pay | Admitting: Family Medicine

## 2021-06-10 ENCOUNTER — Ambulatory Visit (HOSPITAL_COMMUNITY)
Admission: EM | Admit: 2021-06-10 | Discharge: 2021-06-10 | Disposition: A | Discharge status: Home / Self Care | Payer: Medicare Other | Attending: Student | Admitting: Student

## 2021-06-10 ENCOUNTER — Encounter (HOSPITAL_COMMUNITY): Payer: Self-pay

## 2021-06-10 DIAGNOSIS — Z8709 Personal history of other diseases of the respiratory system: Secondary | ICD-10-CM

## 2021-06-10 DIAGNOSIS — R0789 Other chest pain: Secondary | ICD-10-CM

## 2021-06-10 DIAGNOSIS — G8929 Other chronic pain: Secondary | ICD-10-CM

## 2021-06-10 DIAGNOSIS — I739 Peripheral vascular disease, unspecified: Secondary | ICD-10-CM

## 2021-06-10 DIAGNOSIS — I70219 Atherosclerosis of native arteries of extremities with intermittent claudication, unspecified extremity: Secondary | ICD-10-CM

## 2021-06-10 DIAGNOSIS — E119 Type 2 diabetes mellitus without complications: Secondary | ICD-10-CM

## 2021-06-10 MED ORDER — TIZANIDINE HCL 2 MG PO TABS: 2.0000 mg | ORAL_TABLET | Freq: Three times a day (TID) | ORAL | 0 refills | Status: DC | PRN
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
## 2021-06-10 NOTE — Discharge Instructions (Addendum)
-  Tylenol '1000mg'$  up to 3x daily -Heating pad -Start the muscle relaxer-Zanaflex (tizanidine), up to 3 times daily for muscle spasms and pain.  This can make you drowsy, so take at bedtime or when you do not need to drive or operate machinery. -New symptoms like worsening shortness of breath, chest pain, new symptoms like weakness, dizziness- seek additional medical attention.

## 2021-06-10 NOTE — ED Triage Notes (Signed)
Pt reports chest pain, back pain and shortness x 4 days. Pain is worse when lay down.

## 2021-06-11 ENCOUNTER — Ambulatory Visit: Payer: Medicare Other | Admitting: Family Medicine
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
## 2021-06-18 ENCOUNTER — Ambulatory Visit: Payer: Medicare Other
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
## 2021-07-01 ENCOUNTER — Ambulatory Visit (INDEPENDENT_AMBULATORY_CARE_PROVIDER_SITE_OTHER): Payer: Medicare Other | Admitting: Family Medicine

## 2021-07-01 ENCOUNTER — Other Ambulatory Visit: Payer: Self-pay

## 2021-07-01 ENCOUNTER — Encounter: Payer: Self-pay | Admitting: Family Medicine

## 2021-07-01 VITALS — BP 143/82 | HR 79 | Ht 60.0 in | Wt 230.0 lb

## 2021-07-01 DIAGNOSIS — K580 Irritable bowel syndrome with diarrhea: Secondary | ICD-10-CM

## 2021-07-01 DIAGNOSIS — R519 Headache, unspecified: Secondary | ICD-10-CM | POA: Diagnosis not present

## 2021-07-01 DIAGNOSIS — M79672 Pain in left foot: Secondary | ICD-10-CM | POA: Insufficient documentation

## 2021-07-01 DIAGNOSIS — E782 Mixed hyperlipidemia: Secondary | ICD-10-CM | POA: Diagnosis not present

## 2021-07-01 HISTORY — DX: Pain in left foot: M79.672

## 2021-07-01 HISTORY — DX: Headache, unspecified: R51.9

## 2021-07-01 NOTE — Patient Instructions (Signed)
It was wonderful to see you today. Thank you for allowing me to be a part of your care. Below is a short summary of what we discussed at your visit today:  Foot pain  I am sending you to podiatry to be evaluated. They will order imaging.   Head pain  - I am getting cholesterol labs on you today - I want you to go for neck x-ray:  [facility] [address], [place], [place] [address] [phone] Mon-Fri 8-5      Health Maintenance We like to think about ways to keep you healthy for years to come. Below are some interventions and screenings we can offer to keep you healthy: - shingles vaccines (available at your pharmacy) - flu vaccine (available today) -  COVID bivalent booster (available in clinic soon)  Please bring all of your medications to every appointment!  If you have any questions or concerns, please do not hesitate to contact us via phone or MyChart message.   Ezequiel Essex, MD

## 2021-07-01 NOTE — Assessment & Plan Note (Signed)
Patient concerned about left occipital head pain, subacute to chronic in nature and intermittent.  No identifiable aggravating or relieving factors.  Upper and lower extremity strength 5/5 and equal bilaterally.  Patient reports she is worried about an aneurysm.  At first blush, she does exhibit some red flag symptoms (extremity weakness, vision changes, balance difficulties, falls, bowel and bladder incontinence) however with further discussion it appears that these problems are longstanding chronic issues that have not changed or worsened recently.  Most likely etiology is musculoskeletal, will obtain cervical plain films and lipid panel (for risk stratification) today.  If experiencing new, acute, or worsening red flag symptoms repeat head imaging would be reasonable.

## 2021-07-01 NOTE — Assessment & Plan Note (Signed)
Worsening subacute pain, duration couple months.  Patient has noted a knot on the bottom of her foot, which is sharp pain radiates.  Physical exam moderately concerning for fracture at base of fifth left metatarsal; however chronicity of symptoms make this less likely.  Could also be bone spur or plantar fasciitis.  Previous left foot plain films in 2018 and 2019 show small plantar calcaneal spur but no other gross abnormalities.  Will refer to podiatry for evaluation, imaging, and management.

## 2021-07-02 ENCOUNTER — Telehealth: Payer: Self-pay | Admitting: Family Medicine

## 2021-07-02 LAB — LIPID PANEL
Chol/HDL Ratio: 3 ratio (ref 0.0–4.4)
Cholesterol, Total: 177 mg/dL (ref 100–199)
HDL: 60 mg/dL (ref >39)
LDL Chol Calc (NIH): 94 mg/dL (ref 0–99)
Triglycerides: 130 mg/dL (ref 0–149)
VLDL Cholesterol Cal: 23 mg/dL (ref 5–40)

## 2021-07-02 NOTE — Telephone Encounter (Signed)
ASCVD risk 13.6% calculated using this lipid panel and BP from last clinic visit. Currently on atorvastatin 80 mg tablets. Was previously on Zetia but stopped for unknown reasons. Called patient to discuss this. She says that she is taking the atorvastatin but forgets frequently. Can't tell me how many days out of the week she remembers to take her med. Recommended taking every day with recheck direct LDL in 3 months.    Ezequiel Essex, MD

## 2021-07-06 ENCOUNTER — Other Ambulatory Visit: Payer: Self-pay | Admitting: Family Medicine

## 2021-07-06 DIAGNOSIS — R600 Localized edema: Secondary | ICD-10-CM

## 2021-07-06 DIAGNOSIS — E119 Type 2 diabetes mellitus without complications: Secondary | ICD-10-CM

## 2021-07-06 DIAGNOSIS — F315 Bipolar disorder, current episode depressed, severe, with psychotic features: Secondary | ICD-10-CM

## 2021-07-07 MED ORDER — HYDROXYZINE HCL 25 MG PO TABS: 25.0000 mg | ORAL_TABLET | ORAL | 2 refills | Status: DC | PRN

## 2021-07-07 MED ORDER — OZEMPIC (0.25 OR 0.5 MG/DOSE) 2 MG/1.5ML ~~LOC~~ SOPN: 0.5000 mg | PEN_INJECTOR | SUBCUTANEOUS | 1 refills | Status: DC

## 2021-07-14 ENCOUNTER — Ambulatory Visit: Payer: Medicare Other | Admitting: Podiatry

## 2021-07-16 NOTE — Progress Notes (Signed)
No show for appointment. Will send letter with no show policy.   Ezequiel Essex, MD

## 2021-07-18 ENCOUNTER — Ambulatory Visit (INDEPENDENT_AMBULATORY_CARE_PROVIDER_SITE_OTHER): Payer: Medicare Other | Admitting: Family Medicine

## 2021-07-18 DIAGNOSIS — Z91199 Patient's noncompliance with other medical treatment and regimen due to unspecified reason: Secondary | ICD-10-CM

## 2021-07-21 ENCOUNTER — Ambulatory Visit (INDEPENDENT_AMBULATORY_CARE_PROVIDER_SITE_OTHER): Payer: Medicare Other | Admitting: Podiatry

## 2021-07-21 ENCOUNTER — Ambulatory Visit (INDEPENDENT_AMBULATORY_CARE_PROVIDER_SITE_OTHER): Payer: Medicare Other

## 2021-07-21 ENCOUNTER — Other Ambulatory Visit: Payer: Self-pay

## 2021-07-21 DIAGNOSIS — E0843 Diabetes mellitus due to underlying condition with diabetic autonomic (poly)neuropathy: Secondary | ICD-10-CM | POA: Diagnosis not present

## 2021-07-21 DIAGNOSIS — M2141 Flat foot [pes planus] (acquired), right foot: Secondary | ICD-10-CM | POA: Diagnosis not present

## 2021-07-21 DIAGNOSIS — M2142 Flat foot [pes planus] (acquired), left foot: Secondary | ICD-10-CM

## 2021-07-21 NOTE — Progress Notes (Signed)
   HPI: 55 y.o. female presenting today PMHx diabetes mellitus presenting for evaluation of lateral foot pain bilateral.  She denies a history of injury.  She says the pain is significant throughout the day and she is unable to ambulate without any significant pain.  She presents for further treatment evaluation.  She denies a history of injury.  She has not done anything for treatment currently.  Past Medical History:  Diagnosis Date   Arthritis    Benign neoplasm of rectum    Chronic pain    Congestive heart failure (CHF) (HCC)    COPD (chronic obstructive pulmonary disease) (HCC)    Diabetes mellitus (HCC)    Diverticulosis    GERD (gastroesophageal reflux disease) 12/29/2016   HTN (hypertension) 12/29/2016   Hyperplastic colon polyp    Lower extremity edema 12/12/2015   Numbness    left leg   Obesity    Pneumothorax    Schatzki's ring    Screen for sexually transmitted diseases 08/07/2020   Seizures (HCC)    hx of, no seizures last few years   Type 2 diabetes mellitus ([place]) 01/27/2018   UTI (urinary tract infection) 01/29/2020     Physical Exam: General: The patient is alert and oriented x3 in no acute distress.  Dermatology: Skin is warm, dry and supple bilateral lower extremities. Negative for open lesions or macerations.  Vascular: Palpable pedal pulses bilaterally. No edema or erythema noted. Capillary refill within normal limits.  Neurological: Epicritic and protective threshold grossly intact bilaterally.   Musculoskeletal Exam: Range of motion within normal limits to all pedal and ankle joints bilateral. Muscle strength 5/5 in all groups bilateral.  Pes planus noted bilateral lower extremities with significant pain on palpation throughout the lateral column of the foot   Assessment: 1.  Diabetes mellitus with peripheral polyneuropathy  2.  Capsulitis/lateral column pain bilateral 3.  Pes planus both   Plan of Care:  1. Patient evaluated.  Comprehensive diabetic foot  exam performed today 2.  The patient's pain seems to be coming from overloading the lateral column during ambulation.  She has pain across the entire lateral column of the foot.  Diabetic shoes and insoles will help equalize pressure throughout the foot and help support the arches. 3.  Appointment made today for diabetic shoe and insole fitting 4.  Return to clinic for appointment with Pedorthist for diabetic shoes and insoles      Edrick Kins, DPM [facility]  Dr. Edrick Kins, DPM    [address], [place] [address]                Office [phone]  Fax [phone]

## 2021-07-23 ENCOUNTER — Other Ambulatory Visit: Payer: Medicare Other

## 2021-08-14 ENCOUNTER — Ambulatory Visit (INDEPENDENT_AMBULATORY_CARE_PROVIDER_SITE_OTHER): Payer: Medicare Other | Admitting: Family Medicine

## 2021-08-14 DIAGNOSIS — Z91199 Patient's noncompliance with other medical treatment and regimen due to unspecified reason: Secondary | ICD-10-CM

## 2021-08-14 NOTE — Progress Notes (Signed)
No show for appointment. Second consecutive no show for appointment at [facility]. Will send letter.   Ezequiel Essex, MD

## 2021-08-15 ENCOUNTER — Other Ambulatory Visit: Payer: Medicare Other

## 2021-08-20 ENCOUNTER — Other Ambulatory Visit: Payer: Self-pay

## 2021-08-20 ENCOUNTER — Ambulatory Visit: Payer: Medicare Other

## 2021-08-20 DIAGNOSIS — E0843 Diabetes mellitus due to underlying condition with diabetic autonomic (poly)neuropathy: Secondary | ICD-10-CM

## 2021-08-20 NOTE — Progress Notes (Signed)
SITUATION Reason for Consult: Evaluation for Prefabricated Diabetic Shoes and Bilateral Custom Diabetic Inserts. Patient / Caregiver Report: Patient would like well fitting diabetic shoes and is aware of medicaid noncoverage  OBJECTIVE DATA: Patient History / Diagnosis: Diabetes Mellitus with complications  Presence of Diabetic Complications: - Peripheral Neuropathy  Current or Previous Devices: None and no history  In-Person Foot Examination:  Skin presentation:   Thin, Shiny, Hairless Nail presentation:   Thick, Ingrown, With Fungus Ulcers & Callousing:   Medial hallux bilateral  Toe / Foot Deformities:   - Pes Planus  - Hindfoot Valgus - Forefoot ABduction - Midfoot collapse   Sensation:    Diminished bilateral lower extremities  Shoe Size: Ladies 7.5W  ORTHOTIC RECOMMENDATION Recommended Devices: - 1x pair prefabricated PDAC approved diabetic shoes: Apex V751M Women's - 3x pair custom-to-patient direct CAM carved diabetic insoles.   GOALS OF SHOES AND INSOLES - Reduce shear and pressure - Reduce / Prevent callus formation - Reduce / Prevent ulceration - Protect the fragile healing compromised diabetic foot.  Patient would benefit from diabetic shoes and inserts as patient has diabetes mellitus and the patient has one or more of the following conditions: - History of partial or complete amputation of the foot - History of previous foot ulceration. - History of pre-ulcerative callus - Peripheral neuropathy with evidence of callus formation - Foot deformity - Poor circulation  ACTIONS PERFORMED Patient was casted for insoles via crush box and measured for shoes via brannock device. Procedure was explained and patient tolerated procedure well. All questions were answered and concerns addressed.  PLAN Insurance to be verified and out of pocket cost communicated to patient. Once cost verified and agreed upon and diabetic certification received, casts are to be sent to  [facility] for fabrication. Patient is to be called for fitting when devices are ready.

## 2021-08-26 ENCOUNTER — Other Ambulatory Visit: Payer: Self-pay | Admitting: Family Medicine

## 2021-08-26 DIAGNOSIS — I739 Peripheral vascular disease, unspecified: Secondary | ICD-10-CM

## 2021-08-26 DIAGNOSIS — I70219 Atherosclerosis of native arteries of extremities with intermittent claudication, unspecified extremity: Secondary | ICD-10-CM

## 2021-09-05 ENCOUNTER — Other Ambulatory Visit: Payer: Self-pay | Admitting: Family Medicine

## 2021-09-05 DIAGNOSIS — I70219 Atherosclerosis of native arteries of extremities with intermittent claudication, unspecified extremity: Secondary | ICD-10-CM

## 2021-09-05 DIAGNOSIS — I739 Peripheral vascular disease, unspecified: Secondary | ICD-10-CM

## 2021-09-08 ENCOUNTER — Other Ambulatory Visit: Payer: Self-pay | Admitting: Family Medicine

## 2021-09-08 DIAGNOSIS — I739 Peripheral vascular disease, unspecified: Secondary | ICD-10-CM

## 2021-09-08 DIAGNOSIS — I70219 Atherosclerosis of native arteries of extremities with intermittent claudication, unspecified extremity: Secondary | ICD-10-CM

## 2021-09-09 ENCOUNTER — Telehealth: Payer: Self-pay | Admitting: Podiatry

## 2021-09-09 NOTE — Telephone Encounter (Signed)
Received diabetic shoe paperwork but not compliant due to last appt was 6.10.22 and it has to be within 6 months. I notified pt of this and seen she has an appt on 12.21 and told pt to make sure they discuss her diabetes and I will resend the paperwork to be filled out at that appt. She said thank you for the call.

## 2021-09-17 ENCOUNTER — Ambulatory Visit (INDEPENDENT_AMBULATORY_CARE_PROVIDER_SITE_OTHER): Payer: Medicare Other | Admitting: Family Medicine

## 2021-09-17 ENCOUNTER — Other Ambulatory Visit: Payer: Self-pay

## 2021-09-17 ENCOUNTER — Encounter: Payer: Self-pay | Admitting: Family Medicine

## 2021-09-17 VITALS — BP 145/92 | HR 87 | Ht 60.0 in | Wt 227.5 lb

## 2021-09-17 DIAGNOSIS — N3941 Urge incontinence: Secondary | ICD-10-CM

## 2021-09-17 DIAGNOSIS — K580 Irritable bowel syndrome with diarrhea: Secondary | ICD-10-CM | POA: Diagnosis not present

## 2021-09-17 DIAGNOSIS — K529 Noninfective gastroenteritis and colitis, unspecified: Secondary | ICD-10-CM | POA: Diagnosis not present

## 2021-09-17 DIAGNOSIS — G8929 Other chronic pain: Secondary | ICD-10-CM

## 2021-09-17 DIAGNOSIS — M25561 Pain in right knee: Secondary | ICD-10-CM

## 2021-09-17 DIAGNOSIS — I739 Peripheral vascular disease, unspecified: Secondary | ICD-10-CM

## 2021-09-17 DIAGNOSIS — I5032 Chronic diastolic (congestive) heart failure: Secondary | ICD-10-CM

## 2021-09-17 DIAGNOSIS — R600 Localized edema: Secondary | ICD-10-CM

## 2021-09-17 DIAGNOSIS — M549 Dorsalgia, unspecified: Secondary | ICD-10-CM

## 2021-09-17 DIAGNOSIS — E119 Type 2 diabetes mellitus without complications: Secondary | ICD-10-CM | POA: Diagnosis not present

## 2021-09-17 DIAGNOSIS — M25562 Pain in left knee: Secondary | ICD-10-CM | POA: Diagnosis not present

## 2021-09-17 DIAGNOSIS — S24154D 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, subsequent encounter: Secondary | ICD-10-CM

## 2021-09-17 DIAGNOSIS — R32 Unspecified urinary incontinence: Secondary | ICD-10-CM

## 2021-09-17 DIAGNOSIS — R159 Full incontinence of feces: Secondary | ICD-10-CM

## 2021-09-17 DIAGNOSIS — J449 Chronic obstructive pulmonary disease, unspecified: Secondary | ICD-10-CM

## 2021-09-17 LAB — POCT GLYCOSYLATED HEMOGLOBIN (HGB A1C): HbA1c, POC (controlled diabetic range): 6 % (ref 0.0–7.0)

## 2021-09-17 MED ORDER — BUDESONIDE-FORMOTEROL FUMARATE 80-4.5 MCG/ACT IN AERO: 2.0000 | INHALATION_SPRAY | Freq: Two times a day (BID) | RESPIRATORY_TRACT | 3 refills | Status: DC

## 2021-09-17 MED ORDER — GABAPENTIN 300 MG PO CAPS: ORAL_CAPSULE | ORAL | 1 refills | Status: DC

## 2021-09-17 MED ORDER — OZEMPIC (0.25 OR 0.5 MG/DOSE) 2 MG/1.5ML ~~LOC~~ SOPN: 0.5000 mg | PEN_INJECTOR | SUBCUTANEOUS | 1 refills | Status: DC

## 2021-09-17 MED ORDER — FUROSEMIDE 40 MG PO TABS: ORAL_TABLET | ORAL | 0 refills | Status: DC

## 2021-09-17 MED ORDER — MELOXICAM 7.5 MG PO TABS: 7.5000 mg | ORAL_TABLET | Freq: Every day | ORAL | 0 refills | Status: DC

## 2021-09-17 NOTE — Progress Notes (Signed)
SUBJECTIVE:   CHIEF COMPLAINT / HPI:   Diabetes   tingling to hands and toes - A1c 6.0 today - Current med: Semaglutide 0.5 mg weekly - Tolerating well, no concerns -Tingling sensation likely due to diabetic neuropathy despite A1c control - Currently prescribed gabapentin 300 mg a.m. and 600 mg nightly - Patient not currently amenable to trial of Lyrica, requests refill of gabapentin  Chronic back and knee pain - Patient reports it is getting much worse - Does not believe baclofen or Flexeril have helped - Is currently on tizanidine, works "okay" - Would like to try something different   COPD Patient requesting Symbicort refill.  No complaints at this time, reports breathing is doing well.  Chronic diarrhea   IBS   GERD Patient reports continued chronic diarrhea, some bowel incontinence after not being able to make it to the restroom in time.  Would like GI referral.  Mixed urinary incontinence Patient also reports continued stress urinary continence, urge incontinence.  Amenable to urology referral given limitations in single appointment with PCP.  Lasix refill   Hx HFpEF Patient with refill of Lasix.  Last visit with cardiology on 01/30/2021 with Dr. Iona Coach at [facility].   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, subsequent encounter ([place]) 09/20/2021   Foot pain, left 07/01/2021   Head pain 07/01/2021   Diabetic polyneuropathy associated with type 2 diabetes mellitus ([place]) 12/27/2020   Routine screening for STI (sexually transmitted infection) 08/07/2020   (HFpEF) heart failure with preserved ejection fraction ([place]) 06/12/2020    Meralgia paraesthetica 11/10/2018   Bipolar affective disorder, depressed ([place]) 08/27/2018   Type 2 diabetes mellitus without complication, without long-term current use of insulin ([place]) 01/27/2018   COPD (chronic obstructive pulmonary disease) ([place]) 12/29/2016   Mixed hyperlipidemia - secondary prevention of LDL <70 12/29/2016   Gastro-esophageal reflux disease without esophagitis 08/09/2016   Carotid bruit 08/09/2016   PAD (peripheral artery disease) ([place]) 02/25/2016   Obstructive sleep apnea, adult 02/07/2016   HTN, goal below 140/90 01/23/2016   Atherosclerosis with limb claudication ([place]) 01/23/2016   Claudication in peripheral vascular disease ([place]) 01/06/2016   Hearing loss 11/27/2015   Morbid obesity ([place]) 10/29/2015   Excessive daytime sleepiness 10/05/2015   History of depression 10/05/2015   Chronic diarrhea    Urinary incontinence 04/08/2015   Bilateral chronic knee pain 12/07/2014   IBS (irritable bowel syndrome)    Chronic back pain 05/14/2014    OBJECTIVE:   BP (!) 145/92    Pulse 87    Ht 5' (1.524 m)    Wt 227 lb 8 oz (103.2 kg)    LMP 12/03/2016 (Approximate)    SpO2 100%    BMI 44.43 kg/m    PHQ-9:  Depression screen [facility] 2/9 09/17/2021 07/01/2021 03/07/2021  Decreased Interest 0 0 2  Down, Depressed, Hopeless 0 0 3  PHQ - 2 Score 0 0 5  Altered sleeping 1 0 3  Tired, decreased energy 3 0 3  Change in appetite 1 0 0  Feeling bad or failure about yourself  1 0 3  Trouble concentrating 3 0 1  Moving slowly or fidgety/restless 0 0 0  Suicidal thoughts 0 0 0  PHQ-9 Score 9 0 15  Difficult doing work/chores Somewhat difficult - -  Some recent data might be hidden    GAD-7: No flowsheet data found.   Physical Exam General: Awake, alert, oriented Cardiovascular: Regular rate and rhythm, S1 and S2 present, no murmurs auscultated Respiratory: Lung fields clear to auscultation bilaterally Extremities: No bilateral lower extremity edema, palpable pedal and  pretibial pulses bilaterally  ASSESSMENT/PLAN:   COPD (chronic obstructive pulmonary disease) (HCC) Stable.  Patient requesting Symbicort refill.  No complaints at this time, reports breathing is doing well.  Type 2 diabetes mellitus without complication, without long-term current use of insulin (HCC) Stable, at goal.  A1c 6.0 today.  Continue semaglutide 0.5 mg weekly.  Next recheck in 6 months.  Bilateral chronic knee pain Gradually worsening.  Patient did not go for knee x-ray 1 year ago for evaluation knee pain, thought to be likely worsening OA.  Patient reports baclofen and Flexeril did not help her chronic pain.  Tizanidine works "okay".  Requests only different.  We will trial meloxicam.  Follow-up as needed.  Chronic diarrhea Stable. Patient reports continued chronic diarrhea, some bowel incontinence after not being able to make it to the restroom in time.  Requests GI referral, order placed.  Urinary incontinence Patient also reports continued stress urinary continence, urge incontinence.  Amenable to urology referral given limitations in single appointment with PCP. Order placed.   (HFpEF) heart failure with preserved ejection fraction (HCC) Refilled Lasix per patient request.  Last cardiology visit 01/30/2021 with Dr. Iona Coach at [facility].     Ezequiel Essex, MD [place]

## 2021-09-17 NOTE — Patient Instructions (Addendum)
It was wonderful to see you today. Thank you for allowing me to be a part of your care. Below is a short summary of what we discussed at your visit today:  Diabetes I have refilled your ozempic. Your A1c was stable at 6.0.   Urge urine incontinence I have referred you to urology.   IBS, chronic diarrhea I have referred you to GI.    Please bring all of your medications to every appointment!  If you have any questions or concerns, please do not hesitate to contact us via phone or MyChart message.   Ezequiel Essex, MD

## 2021-09-20 DIAGNOSIS — S24154D 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, subsequent encounter: Secondary | ICD-10-CM | POA: Insufficient documentation

## 2021-09-20 NOTE — Assessment & Plan Note (Signed)
Stable, at goal.  A1c 6.0 today.  Continue semaglutide 0.5 mg weekly.  Next recheck in 6 months.

## 2021-09-20 NOTE — Assessment & Plan Note (Signed)
Gradually worsening.  Patient did not go for knee x-ray 1 year ago for evaluation knee pain, thought to be likely worsening OA.  Patient reports baclofen and Flexeril did not help her chronic pain.  Tizanidine works "okay".  Requests only different.  We will trial meloxicam.  Follow-up as needed.

## 2021-09-20 NOTE — Assessment & Plan Note (Signed)
Stable.  Patient requesting Symbicort refill.  No complaints at this time, reports breathing is doing well.

## 2021-09-20 NOTE — Assessment & Plan Note (Signed)
Stable. Patient reports continued chronic diarrhea, some bowel incontinence after not being able to make it to the restroom in time.  Requests GI referral, order placed.

## 2021-09-20 NOTE — Assessment & Plan Note (Signed)
Refilled Lasix per patient request.  Last cardiology visit 01/30/2021 with Dr. Iona Coach at [facility].

## 2021-09-20 NOTE — Assessment & Plan Note (Signed)
Patient also reports continued stress urinary continence, urge incontinence.  Amenable to urology referral given limitations in single appointment with PCP. Order placed.

## 2021-09-25 ENCOUNTER — Ambulatory Visit: Payer: Medicare Other | Admitting: Student

## 2021-09-30 DIAGNOSIS — R350 Frequency of micturition: Secondary | ICD-10-CM | POA: Diagnosis not present

## 2021-10-01 DIAGNOSIS — M4316 Spondylolisthesis, lumbar region: Secondary | ICD-10-CM | POA: Diagnosis not present

## 2021-10-01 DIAGNOSIS — G8929 Other chronic pain: Secondary | ICD-10-CM | POA: Diagnosis not present

## 2021-10-01 DIAGNOSIS — R202 Paresthesia of skin: Secondary | ICD-10-CM | POA: Diagnosis not present

## 2021-10-01 DIAGNOSIS — M25512 Pain in left shoulder: Secondary | ICD-10-CM | POA: Diagnosis not present

## 2021-10-01 DIAGNOSIS — Z8673 Personal history of transient ischemic attack (TIA), and cerebral infarction without residual deficits: Secondary | ICD-10-CM | POA: Diagnosis not present

## 2021-10-01 DIAGNOSIS — F17210 Nicotine dependence, cigarettes, uncomplicated: Secondary | ICD-10-CM | POA: Diagnosis not present

## 2021-10-01 DIAGNOSIS — M545 Low back pain, unspecified: Secondary | ICD-10-CM | POA: Diagnosis not present

## 2021-10-01 DIAGNOSIS — G9589 Other specified diseases of spinal cord: Secondary | ICD-10-CM | POA: Diagnosis not present

## 2021-10-01 DIAGNOSIS — R42 Dizziness and giddiness: Secondary | ICD-10-CM | POA: Diagnosis not present

## 2021-10-01 DIAGNOSIS — M898X8 Other specified disorders of bone, other site: Secondary | ICD-10-CM | POA: Diagnosis not present

## 2021-10-01 DIAGNOSIS — M5441 Lumbago with sciatica, right side: Secondary | ICD-10-CM | POA: Diagnosis not present

## 2021-10-01 DIAGNOSIS — M5442 Lumbago with sciatica, left side: Secondary | ICD-10-CM | POA: Diagnosis not present

## 2021-10-01 DIAGNOSIS — R296 Repeated falls: Secondary | ICD-10-CM | POA: Diagnosis not present

## 2021-10-01 DIAGNOSIS — Z23 Encounter for immunization: Secondary | ICD-10-CM | POA: Diagnosis not present

## 2021-10-01 DIAGNOSIS — R270 Ataxia, unspecified: Secondary | ICD-10-CM | POA: Diagnosis not present

## 2021-10-02 ENCOUNTER — Ambulatory Visit: Payer: Medicare Other | Admitting: Pharmacist

## 2021-10-02 ENCOUNTER — Emergency Department (HOSPITAL_COMMUNITY)
Admission: EM | Admit: 2021-10-02 | Discharge: 2021-10-03 | Discharge status: Against Medical Advice | Payer: Medicare Other | Attending: Emergency Medicine | Admitting: Emergency Medicine

## 2021-10-02 ENCOUNTER — Other Ambulatory Visit: Payer: Self-pay

## 2021-10-02 ENCOUNTER — Encounter (HOSPITAL_COMMUNITY): Payer: Self-pay | Admitting: Emergency Medicine

## 2021-10-02 ENCOUNTER — Emergency Department (HOSPITAL_COMMUNITY): Payer: Medicare Other

## 2021-10-02 DIAGNOSIS — R0602 Shortness of breath: Secondary | ICD-10-CM | POA: Insufficient documentation

## 2021-10-02 DIAGNOSIS — R200 Anesthesia of skin: Secondary | ICD-10-CM | POA: Insufficient documentation

## 2021-10-02 DIAGNOSIS — M25512 Pain in left shoulder: Secondary | ICD-10-CM | POA: Diagnosis not present

## 2021-10-02 DIAGNOSIS — R0789 Other chest pain: Secondary | ICD-10-CM | POA: Diagnosis not present

## 2021-10-02 DIAGNOSIS — E1165 Type 2 diabetes mellitus with hyperglycemia: Secondary | ICD-10-CM | POA: Diagnosis not present

## 2021-10-02 DIAGNOSIS — Z20822 Contact with and (suspected) exposure to covid-19: Secondary | ICD-10-CM | POA: Insufficient documentation

## 2021-10-02 DIAGNOSIS — Z5321 Procedure and treatment not carried out due to patient leaving prior to being seen by health care provider: Secondary | ICD-10-CM | POA: Insufficient documentation

## 2021-10-02 DIAGNOSIS — R079 Chest pain, unspecified: Secondary | ICD-10-CM | POA: Diagnosis not present

## 2021-10-02 LAB — BASIC METABOLIC PANEL
Anion gap: 6 (ref 5–15)
BUN: 10 mg/dL (ref 6–20)
CO2: 29 mmol/L (ref 22–32)
Calcium: 9.2 mg/dL (ref 8.9–10.3)
Chloride: 103 mmol/L (ref 98–111)
Creatinine, Ser: 0.76 mg/dL (ref 0.44–1.00)
GFR, Estimated: >60 mL/min (ref >60)
Glucose, Bld: 94 mg/dL (ref 70–99)
Potassium: 4 mmol/L (ref 3.5–5.1)
Sodium: 138 mmol/L (ref 135–145)

## 2021-10-02 LAB — CBC WITH DIFFERENTIAL/PLATELET
Abs Immature Granulocytes: 0.04 10*3/uL (ref 0.00–0.07)
Basophils Absolute: 0.1 10*3/uL (ref 0.0–0.1)
Basophils Relative: 0 %
Eosinophils Absolute: 0.2 10*3/uL (ref 0.0–0.5)
Eosinophils Relative: 2 %
HCT: 44.4 % (ref 36.0–46.0)
Hemoglobin: 14.2 g/dL (ref 12.0–15.0)
Immature Granulocytes: 0 %
Lymphocytes Relative: 27 %
Lymphs Abs: 3.1 10*3/uL (ref 0.7–4.0)
MCH: 30.7 pg (ref 26.0–34.0)
MCHC: 32 g/dL (ref 30.0–36.0)
MCV: 95.9 fL (ref 80.0–100.0)
Monocytes Absolute: 0.7 10*3/uL (ref 0.1–1.0)
Monocytes Relative: 6 %
Neutro Abs: 7.6 10*3/uL (ref 1.7–7.7)
Neutrophils Relative %: 65 %
Platelets: 290 10*3/uL (ref 150–400)
RBC: 4.63 MIL/uL (ref 3.87–5.11)
RDW: 13.5 % (ref 11.5–15.5)
WBC: 11.7 10*3/uL — ABNORMAL HIGH (ref 4.0–10.5)
nRBC: 0 % (ref 0.0–0.2)

## 2021-10-02 LAB — TROPONIN I (HIGH SENSITIVITY): Troponin I (High Sensitivity): 8 ng/L (ref <18)

## 2021-10-02 LAB — RESP PANEL BY RT-PCR (FLU A&B, COVID) ARPGX2
Influenza A by PCR: NEGATIVE
Influenza B by PCR: NEGATIVE
SARS Coronavirus 2 by RT PCR: NEGATIVE

## 2021-10-02 NOTE — ED Triage Notes (Signed)
Patient with chest pain, shortness of breath and left shoulder pain, she has numbness in that shoulder.  She states that chest pain started last week, continues into this week.  She states the shoulder numbness started three days ago.  She is unable to lift it and has weaker grip than the right.  She states she got a CT of her Brain yesterday.  No slurred speech, no facial droop and equal leg strength.

## 2021-10-02 NOTE — ED Provider Triage Note (Signed)
Emergency Medicine Provider Triage Evaluation Note  Elizabeth Diaz , a 55 y.o. female  was evaluated in triage.  Pt complains of chest pain and shortness of breath.  Patient states that for 1 week she has had intermittent chest pressure that she states is on the left side and radiates down her left arm associated with left arm numbness and tingling.  Additionally she has hadshortness of breath for around 3 days.  She states that her symptoms are worsening.  She additionally endorses hot and cold chills.  Denies cough or upper respiratory symptoms.  Review of Systems  Positive: See above Negative:  Physical Exam  BP (!) 143/108 (BP Location: Right Arm)    Pulse 91    Temp 99.1 F (37.3 C) (Oral)    Resp 15    LMP 12/03/2016 (Approximate)    SpO2 97%  Gen:   Awake, no distress   Resp:  Normal effort  MSK:   Moves extremities without difficulty  Other:  S1/S2 without murmur.  Lungs distant but clear to auscultation.  Medical Decision Making  Medically screening exam initiated at 8:15 PM.  Appropriate orders placed.  RETAJ HILBUN was informed that the remainder of the evaluation will be completed by another provider, this initial triage assessment does not replace that evaluation, and the importance of remaining in the ED until their evaluation is complete.     Mickie [place]ier, PA-C 10/02/21 2016

## 2021-10-03 ENCOUNTER — Encounter: Payer: Self-pay | Admitting: Family Medicine

## 2021-10-03 ENCOUNTER — Telehealth: Payer: Self-pay

## 2021-10-03 NOTE — ED Notes (Signed)
Pt called multiple times no answer 

## 2021-10-03 NOTE — Telephone Encounter (Signed)
Labs are normal. Was supposed to have second troponin drawn but it appears she left prior to this being drawn. I will send her a direct MyChart message with the lab interpretation since it is normal.  If her chest pain persists or gets worse, she will need to be evaluated in ED or UC prior to Monday appt.   Ezequiel Essex, MD

## 2021-10-03 NOTE — Telephone Encounter (Signed)
Edwena Blow patients friend calls nurse line reporting ED visit yesterday. Edwena Blow reports the patient began to experience SOB and started gasping for air. Edwena Blow reports she was also experiencing numbness and tingling in her hands. Edwena Blow reports labs were drawn and the results were sent to her mychart. Edwena Blow is requesting PCP interpretation. Edwena Blow reports they did leave and did not see a provider. Edwena Blow reports the patient is feeling better today, however requests an apt ASAP.   Patient scheduled for ATC on Monday for evaluation. Red flags discussed with Shana. Will forward to PCP for interpretation of ED lab work.

## 2021-10-06 ENCOUNTER — Ambulatory Visit (INDEPENDENT_AMBULATORY_CARE_PROVIDER_SITE_OTHER): Payer: Medicare Other | Admitting: Family Medicine

## 2021-10-06 ENCOUNTER — Other Ambulatory Visit: Payer: Self-pay

## 2021-10-06 VITALS — BP 125/56 | HR 77 | Ht 60.0 in | Wt 222.0 lb

## 2021-10-06 DIAGNOSIS — B351 Tinea unguium: Secondary | ICD-10-CM | POA: Diagnosis not present

## 2021-10-06 DIAGNOSIS — R0789 Other chest pain: Secondary | ICD-10-CM | POA: Diagnosis not present

## 2021-10-06 DIAGNOSIS — R4586 Emotional lability: Secondary | ICD-10-CM

## 2021-10-06 DIAGNOSIS — K580 Irritable bowel syndrome with diarrhea: Secondary | ICD-10-CM | POA: Diagnosis not present

## 2021-10-06 DIAGNOSIS — L609 Nail disorder, unspecified: Secondary | ICD-10-CM | POA: Diagnosis not present

## 2021-10-06 MED ORDER — TERBINAFINE HCL 250 MG PO TABS: 250.0000 mg | ORAL_TABLET | Freq: Every day | ORAL | 0 refills | Status: DC
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
## 2021-10-11 ENCOUNTER — Encounter: Payer: Self-pay | Admitting: Family Medicine

## 2021-10-11 DIAGNOSIS — M47814 Spondylosis without myelopathy or radiculopathy, thoracic region: Secondary | ICD-10-CM | POA: Diagnosis not present

## 2021-10-11 DIAGNOSIS — M4802 Spinal stenosis, cervical region: Secondary | ICD-10-CM | POA: Diagnosis not present

## 2021-10-11 DIAGNOSIS — M48061 Spinal stenosis, lumbar region without neurogenic claudication: Secondary | ICD-10-CM | POA: Diagnosis not present

## 2021-10-11 DIAGNOSIS — M47812 Spondylosis without myelopathy or radiculopathy, cervical region: Secondary | ICD-10-CM | POA: Diagnosis not present

## 2021-10-11 DIAGNOSIS — M545 Low back pain, unspecified: Secondary | ICD-10-CM | POA: Diagnosis not present

## 2021-10-11 DIAGNOSIS — G959 Disease of spinal cord, unspecified: Secondary | ICD-10-CM | POA: Diagnosis not present

## 2021-10-11 DIAGNOSIS — M47816 Spondylosis without myelopathy or radiculopathy, lumbar region: Secondary | ICD-10-CM | POA: Diagnosis not present

## 2021-10-11 DIAGNOSIS — R202 Paresthesia of skin: Secondary | ICD-10-CM | POA: Diagnosis not present

## 2021-10-11 DIAGNOSIS — M4316 Spondylolisthesis, lumbar region: Secondary | ICD-10-CM | POA: Diagnosis not present

## 2021-10-11 DIAGNOSIS — M898X8 Other specified disorders of bone, other site: Secondary | ICD-10-CM | POA: Diagnosis not present

## 2021-10-11 DIAGNOSIS — G8929 Other chronic pain: Secondary | ICD-10-CM | POA: Diagnosis not present

## 2021-10-11 MED ORDER — TERBINAFINE HCL 250 MG PO TABS: 250.0000 mg | ORAL_TABLET | Freq: Every day | ORAL | 2 refills | Status: DC

## 2021-10-11 NOTE — Assessment & Plan Note (Addendum)
Patient reported increased life and health stress recently.  Went to the ED but did not get seen.  EKG and lab work grossly unremarkable.  She only received 1 troponin however.  Pain is reproducible on exam therefore it may be a MSK component vs costochondritis.  Follows with Dr. Ronnald Ramp at [facility].  Advised patient to follow-up with her cardiologist as she may need a new stress test.
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
## 2021-10-11 NOTE — Assessment & Plan Note (Addendum)
Chronic diarrhea.  ED labs reviewed normal electrolytes.  On chart review, patient previously followed with [facility].  Symptoms previously controlled with loperamide.  She requests referral for [facility] gastroenterology.  Referral placed.

## 2021-10-11 NOTE — Assessment & Plan Note (Signed)
On chart review patient was treated with terbinafine 250 for 12 weeks.  We will treat patient again.  Reviewed recent LFTs.  Lamisil sent to pharmacy.

## 2021-10-14 ENCOUNTER — Ambulatory Visit: Payer: Medicare Other | Admitting: Family Medicine

## 2021-10-14 DIAGNOSIS — M25512 Pain in left shoulder: Secondary | ICD-10-CM | POA: Diagnosis not present

## 2021-10-20 DIAGNOSIS — M25512 Pain in left shoulder: Secondary | ICD-10-CM | POA: Diagnosis not present

## 2021-10-20 DIAGNOSIS — E119 Type 2 diabetes mellitus without complications: Secondary | ICD-10-CM | POA: Diagnosis not present

## 2021-10-20 DIAGNOSIS — R42 Dizziness and giddiness: Secondary | ICD-10-CM | POA: Diagnosis not present

## 2021-10-28 ENCOUNTER — Ambulatory Visit (INDEPENDENT_AMBULATORY_CARE_PROVIDER_SITE_OTHER): Payer: Medicare Other | Admitting: Family Medicine

## 2021-10-28 ENCOUNTER — Other Ambulatory Visit: Payer: Self-pay

## 2021-10-28 ENCOUNTER — Encounter: Payer: Self-pay | Admitting: Family Medicine

## 2021-10-28 VITALS — BP 122/62 | HR 96 | Ht 60.0 in | Wt 225.8 lb

## 2021-10-28 DIAGNOSIS — E119 Type 2 diabetes mellitus without complications: Secondary | ICD-10-CM

## 2021-10-28 DIAGNOSIS — E782 Mixed hyperlipidemia: Secondary | ICD-10-CM

## 2021-10-28 DIAGNOSIS — M25512 Pain in left shoulder: Secondary | ICD-10-CM

## 2021-10-28 DIAGNOSIS — K580 Irritable bowel syndrome with diarrhea: Secondary | ICD-10-CM

## 2021-10-28 DIAGNOSIS — S24154D 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, subsequent encounter: Secondary | ICD-10-CM | POA: Diagnosis not present

## 2021-10-28 DIAGNOSIS — E1142 Type 2 diabetes mellitus with diabetic polyneuropathy: Secondary | ICD-10-CM | POA: Diagnosis not present

## 2021-10-28 DIAGNOSIS — R350 Frequency of micturition: Secondary | ICD-10-CM | POA: Diagnosis not present

## 2021-10-28 MED ORDER — EZETIMIBE 10 MG PO TABS: 10.0000 mg | ORAL_TABLET | Freq: Every day | ORAL | 3 refills | Status: DC

## 2021-10-28 MED ORDER — ATORVASTATIN CALCIUM 80 MG PO TABS: 80.0000 mg | ORAL_TABLET | Freq: Every day | ORAL | 3 refills | Status: DC

## 2021-10-28 MED ORDER — OZEMPIC (0.25 OR 0.5 MG/DOSE) 2 MG/1.5ML ~~LOC~~ SOPN: 0.5000 mg | PEN_INJECTOR | SUBCUTANEOUS | 3 refills | Status: DC

## 2021-10-28 MED ORDER — GABAPENTIN 600 MG PO TABS: 600.0000 mg | ORAL_TABLET | Freq: Three times a day (TID) | ORAL | 3 refills | Status: DC

## 2021-10-28 NOTE — Assessment & Plan Note (Signed)
Acute on chronic.  No known injuries.  Was seen earlier by urgent care in [place], however we do not have those records.  Wonder if rotator cuff torn versus AC joint arthritis.  Will obtain shoulder x-ray.  Has appointment with [facility] on Friday 2/3.

## 2021-10-28 NOTE — Progress Notes (Signed)
SUBJECTIVE:   CHIEF COMPLAINT / HPI:   MRI spinal lesion Had CT spine on 10/11/2021 with oncology at [facility] for evaluation of thoracic spinal lesion.  MRI read by radiology as 7 mm rounded T1 hypointense lesion on the anterior inferior endplate of D74.  Favored as benign.  Gabapentin refill, increase tab Patient requests increase of gabapentin from 30 mg tab to 60 mg tab.  Previously written as 200 mg a.m. and 600 mg nightly; however patient taking 600-900 mg 3 times daily.  Discussed with patient danger of taking this with other sedating medications.  She agrees to stop hydroxyzine and muscle relaxers.  Left shoulder - went to UC in [place] - chronic pain, but worsening in last month - lots of sharp pain, difficulty using left hand, pain - they got x-rays, told her it was a torn rotator cuff - UC rx vicodin x 5 days - has been trying heating pad, icy hot  Cholesterol LDL goal <70 due to PAD Tolerating atorvastatin 80 mg well Am[place] to addition of Zetia Lab Results  Component Value Date   CHOL 177 07/01/2021   HDL 60 07/01/2021   [place] 94 07/01/2021   TRIG 130 07/01/2021   CHOLHDL 3.0 07/01/2021    PERTINENT  PMH / PSH:  Patient Active Problem List   Diagnosis Date Noted   Shoulder pain, left 10/28/2021   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, subsequent encounter ([place]) 09/20/2021   Diabetic polyneuropathy associated with type 2 diabetes mellitus ([place]) 12/27/2020   (HFpEF) heart failure with preserved ejection fraction ([place]) 06/12/2020   Meralgia paraesthetica 11/10/2018   Bipolar affective disorder, depressed ([place]) 08/27/2018   Onychomycosis 02/14/2018   Type 2 diabetes mellitus without complication, without long-term current use of insulin ([place]) 01/27/2018   COPD (chronic obstructive pulmonary disease) ([place]) 12/29/2016   Mixed hyperlipidemia - secondary prevention of LDL <70 12/29/2016   Gastro-esophageal reflux disease without  esophagitis 08/09/2016   Carotid bruit 08/09/2016   PAD (peripheral artery disease) ([place]) 02/25/2016   Obstructive sleep apnea, adult 02/07/2016   HTN, goal below 140/90 01/23/2016   Atherosclerosis with limb claudication ([place]) 01/23/2016   Claudication in peripheral vascular disease ([place]) 01/06/2016   Hearing loss 11/27/2015   Morbid obesity ([place]) 10/29/2015   Excessive daytime sleepiness 10/05/2015   Chronic diarrhea    Urinary incontinence 04/08/2015   Bilateral chronic knee pain 12/07/2014   IBS (irritable bowel syndrome)    Chronic back pain 05/14/2014     OBJECTIVE:   BP 122/62    Pulse 96    Ht 5' (1.524 m)    Wt 225 lb 12.8 oz (102.4 kg)    LMP 12/03/2016 (Approximate)    SpO2 96%    BMI 44.10 kg/m    PHQ-9:  Depression screen [facility] 2/9 10/28/2021 10/06/2021 09/17/2021  Decreased Interest 1 2 0  Down, Depressed, Hopeless 2 3 0  PHQ - 2 Score 3 5 0  Altered sleeping 0 3 1  Tired, decreased energy 2 3 3   Change in appetite 0 1 1  Feeling bad or failure about yourself  0 3 1  Trouble concentrating 0 0 3  Moving slowly or fidgety/restless 0 0 0  Suicidal thoughts 0 3 0  PHQ-9 Score 5 18 9   Difficult doing work/chores Very difficult Extremely dIfficult Somewhat difficult  Some recent data might be hidden     GAD-7: No flowsheet data found.  Physical Exam General: Awake, alert, oriented  Cardiovascular: Regular rate and rhythm, S1 and S2 present, no murmurs auscultated Respiratory: Lung fields clear to auscultation bilaterally Extremities: Left shoulder ROM limited by pain to 90 degrees (both active and passive), grip strength equal bilaterally and 5/5, left AC joint exquisitely TTP Neuro: Cranial nerves II through X grossly intact, able to move all extremities spontaneously  ASSESSMENT/PLAN:   Shoulder pain, left Acute on chronic.  No known injuries.  Was seen earlier by urgent care in [place], however we do not have those records.  Wonder if rotator cuff torn  versus AC joint arthritis.  Will obtain shoulder x-ray.  Has appointment with [facility] on Friday 2/3.  Mixed hyperlipidemia - secondary prevention of LDL <70 Last LDL in October 94.  Goal <70 due to PAD.  Tolerating atorvastatin 80 mg quite well.  Amenable to Zetia trial.  Recheck direct LDL in 1 month.  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  MRI read by radiology as 7 mm rounded T1 hypointense lesion on the anterior inferior endplate of V69.  Favored as benign.  IBS (irritable bowel syndrome) Chart review indicates previous referral was canceled for unknown reason.  Will place referral again today.     Ezequiel Essex, MD [place]

## 2021-10-28 NOTE — Assessment & Plan Note (Signed)
Last LDL in October 94.  Goal <70 due to PAD.  Tolerating atorvastatin 80 mg quite well.  Amenable to Zetia trial.  Recheck direct LDL in 1 month.

## 2021-10-28 NOTE — Assessment & Plan Note (Signed)
Had CT spine on 10/11/2021 with oncology at [facility] for evaluation of thoracic spinal lesion.  MRI read by radiology as 7 mm rounded T1 hypointense lesion on the anterior inferior endplate of N27.  Favored as benign.

## 2021-10-28 NOTE — Patient Instructions (Addendum)
It was wonderful to see you today. Thank you for allowing me to be a part of your care. Below is a short summary of what we discussed at your visit today:  Spinal lesion Today we reviewed your spinal MRI. I have put the results in your chart.   Cholesterol  I have refilled your atorvastatin. Please keep taking this.   Start the new cholesterol medication called Zetia. Take it once daily. Come back in one month for a lab only visit to check your cholesterol.   Rotator Cuff Please keep your appointment with [facility] on 2/03 for your left shoulder. I have sent an order for your shoulder x-ray to see if you have any arthritis in your joints that is contributing to this shoulder pain.  [facility] [address], [place], [place] [address] [phone] Mon-Fri 8-5      Please bring all of your medications to every appointment!  If you have any questions or concerns, please do not hesitate to contact us via phone or MyChart message.   Ezequiel Essex, MD

## 2021-10-28 NOTE — Assessment & Plan Note (Signed)
Chart review indicates previous referral was canceled for unknown reason.  Will place referral again today.

## 2021-10-29 ENCOUNTER — Encounter: Payer: Self-pay | Admitting: *Deleted

## 2021-10-31 DIAGNOSIS — M25512 Pain in left shoulder: Secondary | ICD-10-CM | POA: Diagnosis not present

## 2021-10-31 DIAGNOSIS — M24812 Other specific joint derangements of left shoulder, not elsewhere classified: Secondary | ICD-10-CM | POA: Diagnosis not present

## 2021-10-31 DIAGNOSIS — M19012 Primary osteoarthritis, left shoulder: Secondary | ICD-10-CM | POA: Diagnosis not present

## 2021-10-31 DIAGNOSIS — G8929 Other chronic pain: Secondary | ICD-10-CM | POA: Diagnosis not present

## 2021-10-31 NOTE — Progress Notes (Signed)
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, subsequent encounter ([place]) 09/20/2021   Diabetic polyneuropathy associated with type 2 diabetes mellitus ([place]) 12/27/2020   (HFpEF) heart failure with preserved ejection fraction ([place]) 06/12/2020   Meralgia paraesthetica 11/10/2018   Bipolar affective disorder, depressed ([place]) 08/27/2018   Onychomycosis 02/14/2018   Type 2 diabetes mellitus without complication, without long-term current use of insulin ([place]) 01/27/2018   COPD (chronic obstructive pulmonary disease) ([place]) 12/29/2016   Mixed hyperlipidemia - secondary prevention of LDL <70 12/29/2016   Gastro-esophageal reflux disease without esophagitis 08/09/2016   Carotid bruit 08/09/2016   PAD (peripheral artery disease) ([place]) 02/25/2016   Obstructive sleep apnea, adult 02/07/2016   HTN, goal below 140/90 01/23/2016   Atherosclerosis with limb claudication ([place]) 01/23/2016   Claudication in peripheral vascular disease ([place]) 01/06/2016   Hearing loss 11/27/2015   Morbid obesity ([place]) 10/29/2015   Excessive daytime sleepiness 10/05/2015   Chronic diarrhea    Urinary incontinence 04/08/2015   Bilateral chronic knee pain 12/07/2014   IBS (irritable bowel syndrome)    Chronic back pain 05/14/2014     OBJECTIVE:   BP 130/62    Pulse 72    Ht 5' (1.524 m)    Wt 229 lb 3.2 oz (104 kg)    LMP  12/03/2016 (Approximate)    SpO2 96%    BMI 44.76 kg/m   PHQ-9:  Depression screen [facility] 2/9 11/04/2021 10/28/2021 10/06/2021  Decreased Interest 0 1 2  Down, Depressed, Hopeless 0 2 3  PHQ - 2 Score 0 3 5  Altered sleeping 0 0 3  Tired, decreased energy 0 2 3  Change in appetite 0 0 1  Feeling bad or failure about yourself  0 0 3  Trouble concentrating 0 0 0  Moving slowly or fidgety/restless 0 0 0  Suicidal thoughts 0 0 3  PHQ-9 Score 0 5 18  Difficult doing work/chores Not difficult at all Very difficult Extremely dIfficult  Some recent data might be hidden     GAD-7: No flowsheet data found.   Physical Exam General: Awake, alert, oriented, no acute distress Respiratory: Unlabored respirations, speaking in full sentences, no respiratory distress Extremities: Moving all extremities spontaneously Neuro: Cranial nerves II through X grossly intact Psych: Normal insight and judgement   ASSESSMENT/PLAN:   Post-menopausal bleeding Acute, since Friday. Changing depends 3x/day. Some dizziness, no syncope, POC Hgb normal today. TVUS scheduled for 2/14. Depending on endometrial thickness, will likely need endometrial biopsy, however will wait for TVUS results. Return and hospital precautions given. See AVS for more.     Ezequiel Essex, MD [place]

## 2021-11-04 ENCOUNTER — Ambulatory Visit (INDEPENDENT_AMBULATORY_CARE_PROVIDER_SITE_OTHER): Payer: Medicare Other | Admitting: Family Medicine

## 2021-11-04 ENCOUNTER — Encounter: Payer: Self-pay | Admitting: Family Medicine

## 2021-11-04 ENCOUNTER — Other Ambulatory Visit: Payer: Self-pay

## 2021-11-04 VITALS — BP 130/62 | HR 72 | Ht 60.0 in | Wt 229.2 lb

## 2021-11-04 DIAGNOSIS — E119 Type 2 diabetes mellitus without complications: Secondary | ICD-10-CM

## 2021-11-04 DIAGNOSIS — R3129 Other microscopic hematuria: Secondary | ICD-10-CM

## 2021-11-04 DIAGNOSIS — N950 Postmenopausal bleeding: Secondary | ICD-10-CM

## 2021-11-04 LAB — POCT URINALYSIS DIP (MANUAL ENTRY)
Bilirubin, UA: NEGATIVE
Glucose, UA: NEGATIVE mg/dL
Ketones, POC UA: NEGATIVE mg/dL
Leukocytes, UA: NEGATIVE
Nitrite, UA: NEGATIVE
Protein Ur, POC: 30 mg/dL — AB
Spec Grav, UA: >=1.03 — AB (ref 1.010–1.025)
Urobilinogen, UA: 0.2 E.U./dL
pH, UA: 5.5 (ref 5.0–8.0)

## 2021-11-04 LAB — POCT UA - MICROSCOPIC ONLY

## 2021-11-04 LAB — POCT UA - MICROALBUMIN
Creatinine, POC: 200 mg/dL
Microalbumin Ur, POC: 150 mg/L

## 2021-11-04 LAB — POCT HEMOGLOBIN: Hemoglobin: 12.5 g/dL (ref 11–14.6)

## 2021-11-04 NOTE — Patient Instructions (Addendum)
It was wonderful to see you today. Thank you for allowing me to be a part of your care. Below is a short summary of what we discussed at your visit today:  Post menopausal bleeding - Today we tested your urine to make sure the blood was not coming from your bladder. If there is blood in your bladder, we may need to send you to urology. The urology office will call you directly to schedule.  - I have ordered a transvaginal ultrasound. We need to evaluate your uterus and this is the first step. The nurse will give you an appointment card with your ultrasound appointment date and time.  Tues, Feb 14 at 2pm at [place] next step is an endometrial biopsy. We will schedule that once we have the results of your ultrasound.    Diabetes I have sent a referral to the eye doctor to do your annual diabetic eye exam. They will call you directly to schedule.     Please bring all of your medications to every appointment!  If you have any questions or concerns, please do not hesitate to contact us via phone or MyChart message.   Ezequiel Essex, MD

## 2021-11-05 DIAGNOSIS — N950 Postmenopausal bleeding: Secondary | ICD-10-CM | POA: Insufficient documentation

## 2021-11-05 MED ORDER — OZEMPIC (0.25 OR 0.5 MG/DOSE) 2 MG/1.5ML ~~LOC~~ SOPN: 0.5000 mg | PEN_INJECTOR | SUBCUTANEOUS | 3 refills | Status: DC

## 2021-11-05 NOTE — Assessment & Plan Note (Signed)
Acute, since Friday. Changing depends 3x/day. Some dizziness, no syncope, POC Hgb normal today. TVUS scheduled for 2/14. Depending on endometrial thickness, will likely need endometrial biopsy, however will wait for TVUS results. Return and hospital precautions given. See AVS for more.

## 2021-11-08 ENCOUNTER — Other Ambulatory Visit: Payer: Self-pay

## 2021-11-08 ENCOUNTER — Encounter (HOSPITAL_COMMUNITY): Payer: Self-pay | Admitting: Emergency Medicine

## 2021-11-08 ENCOUNTER — Emergency Department (HOSPITAL_COMMUNITY)
Admission: EM | Admit: 2021-11-08 | Discharge: 2021-11-08 | Disposition: A | Discharge status: Home / Self Care | Payer: Medicare Other | Attending: Emergency Medicine | Admitting: Emergency Medicine

## 2021-11-08 DIAGNOSIS — N939 Abnormal uterine and vaginal bleeding, unspecified: Secondary | ICD-10-CM | POA: Diagnosis not present

## 2021-11-08 DIAGNOSIS — N888 Other specified noninflammatory disorders of cervix uteri: Secondary | ICD-10-CM | POA: Insufficient documentation

## 2021-11-08 DIAGNOSIS — R58 Hemorrhage, not elsewhere classified: Secondary | ICD-10-CM | POA: Diagnosis not present

## 2021-11-08 DIAGNOSIS — Z7982 Long term (current) use of aspirin: Secondary | ICD-10-CM | POA: Insufficient documentation

## 2021-11-08 DIAGNOSIS — I10 Essential (primary) hypertension: Secondary | ICD-10-CM | POA: Diagnosis not present

## 2021-11-08 LAB — BASIC METABOLIC PANEL
Anion gap: 8 (ref 5–15)
BUN: 12 mg/dL (ref 6–20)
CO2: 26 mmol/L (ref 22–32)
Calcium: 8.9 mg/dL (ref 8.9–10.3)
Chloride: 104 mmol/L (ref 98–111)
Creatinine, Ser: 0.69 mg/dL (ref 0.44–1.00)
GFR, Estimated: >60 mL/min (ref >60)
Glucose, Bld: 108 mg/dL — ABNORMAL HIGH (ref 70–99)
Potassium: 4.1 mmol/L (ref 3.5–5.1)
Sodium: 138 mmol/L (ref 135–145)

## 2021-11-08 LAB — CBC
HCT: 42.8 % (ref 36.0–46.0)
Hemoglobin: 13.9 g/dL (ref 12.0–15.0)
MCH: 30.9 pg (ref 26.0–34.0)
MCHC: 32.5 g/dL (ref 30.0–36.0)
MCV: 95.1 fL (ref 80.0–100.0)
Platelets: 300 10*3/uL (ref 150–400)
RBC: 4.5 MIL/uL (ref 3.87–5.11)
RDW: 13.2 % (ref 11.5–15.5)
WBC: 11.7 10*3/uL — ABNORMAL HIGH (ref 4.0–10.5)
nRBC: 0 % (ref 0.0–0.2)

## 2021-11-08 LAB — I-STAT BETA HCG BLOOD, ED (MC, WL, AP ONLY): I-stat hCG, quantitative: <5 m[IU]/mL (ref <5)

## 2021-11-08 LAB — TYPE AND SCREEN
ABO/RH(D): B POS
Antibody Screen: NEGATIVE

## 2021-11-08 MED ORDER — MEGESTROL ACETATE 40 MG PO TABS: 40.0000 mg | ORAL_TABLET | Freq: Every day | ORAL | 0 refills | Status: DC

## 2021-11-08 MED ORDER — MEGESTROL ACETATE 40 MG PO TABS
40.0000 mg | ORAL_TABLET | Freq: Every day | ORAL | Status: DC
  Administered 2021-11-08: 40 mg via ORAL
  Filled 2021-11-08: qty 1

## 2021-11-08 NOTE — ED Notes (Signed)
Pt verbalizes understanding of discharge instructions. Opportunity for questions and answers were provided. Pt discharged from the ED.   ?

## 2021-11-08 NOTE — ED Provider Notes (Signed)
[facility] EMERGENCY DEPARTMENT Provider Note   CSN: [id number] Arrival date & time: 11/08/21  1352     History  Chief Complaint  Patient presents with   Vaginal Bleeding    Elizabeth Diaz is a 56 y.o. female.  HPI 56 year old female states no menstrual cycle for 5 years, presents today complaining of vaginal bleeding that began 7 days 10 days ago.  She states it was fairly sudden in onset.  She has some associated crampy lower abdominal pain.  She has been going through approximately 4 pads per day.  She was seen in her primary care office on February 7.  She had an outpatient ultrasound ordered.  Urinalysis was obtained via clean-catch.  That urine showed a large amount of red blood cells although only 1-5 microscopically.  She states she has chronic continence issues.  Does not indicate any new UTI type symptoms.  She has chronic back pain for which she takes Neurontin.  She reports that she has had trouble accessing her injectable weekly diabetes medicine and has not had it for 2 weeks.  She reports some lightheadedness.  She denies any chest pain or dyspnea.    Home Medications Prior to Admission medications   Medication Sig Start Date End Date Taking? Authorizing Provider  albuterol (ACCUNEB) 1.25 MG/3ML nebulizer solution Take 3 mLs (1.25 mg total) by nebulization every 6 (six) hours as needed. 11/08/18   Shirley, [place], DO  aspirin 81 MG chewable tablet Chew 81 mg by mouth daily.    [provider]  atorvastatin (LIPITOR) 80 MG tablet Take 1 tablet (80 mg total) by mouth daily. 10/28/21   Ezequiel Essex, MD  blood glucose meter kit and supplies KIT Dispense based on patient and insurance preference. Use once daily as directed. Dx code E11.9. 11/30/18   Zenia Resides, MD  Blood Glucose Monitoring Suppl ([place]) w/Device KIT Use glucometer to test sugar. DX: E11.9 02/01/20   Shirley, [place], DO  budesonide-formoterol (SYMBICORT)  80-4.5 MCG/ACT inhaler Inhale 2 puffs into the lungs 2 (two) times daily. 09/17/21   Ezequiel Essex, MD  ezetimibe (ZETIA) 10 MG tablet Take 1 tablet (10 mg total) by mouth daily. 10/28/21   Ezequiel Essex, MD  furosemide (LASIX) 40 MG tablet TAKE 1/2 (ONE-HALF) TABLET BY MOUTH ONCE DAILY AS NEEDED 09/17/21   Ezequiel Essex, MD  gabapentin (NEURONTIN) 600 MG tablet Take 1 tablet (600 mg total) by mouth 3 (three) times daily. 10/28/21 11/27/21  Ezequiel Essex, MD  glucose blood [facility] VERIO) test strip Use as instructed 01/29/20   Shirley, [place], DO  hydrOXYzine (ATARAX/VISTARIL) 25 MG tablet Take 1 tablet (25 mg total) by mouth every 4 (four) hours as needed. Patient not taking: Reported on 10/28/2021 07/07/21   Ezequiel Essex, MD  Lancets Misc. (ACCU-CHEK SOFTCLIX LANCET DEV) KIT Use as instructed to test sugars once daily.  Dx code:E11.9 11/30/18   Zenia Resides, MD  meloxicam (MOBIC) 7.5 MG tablet Take 1 tablet (7.5 mg total) by mouth daily. Patient not taking: Reported on 10/28/2021 09/17/21   Ezequiel Essex, MD  ondansetron [facility] ODT) 4 MG disintegrating tablet Take 1 tablet (4 mg total) by mouth every 8 (eight) hours as needed for nausea or vomiting. 12/18/19   Shirley, [place], DO  OneTouch Delica Lancets 24O MISC 1 Units by Does not apply route daily. 01/29/20   Shirley, [place], DO  Semaglutide,0.25 or 0.5MG/DOS, (OZEMPIC, 0.25 OR 0.5 MG/DOSE,) 2 MG/1.5ML SOPN Inject 0.5 mg  as directed once a week. 11/05/21 02/03/22  Ezequiel Essex, MD  terbinafine (LAMISIL) 250 MG tablet Take 1 tablet (250 mg total) by mouth daily. 10/11/21 01/09/22  Lyndee Hensen, DO      Allergies    Patient has no known allergies.    Review of Systems   Review of Systems  Physical Exam Updated Vital Signs BP (!) 141/96 (BP Location: Right Arm)    Pulse 73    Temp 98.8 F (37.1 C) (Oral)    Ht 1.524 m (5')    Wt 102.1 kg    LMP 12/03/2016 (Approximate)    SpO2 98%    BMI 43.94 kg/m  Physical Exam Vitals and  nursing note reviewed. Exam conducted with a chaperone present.  Constitutional:      General: She is not in acute distress.    Appearance: She is well-developed.  HENT:     Head: Normocephalic and atraumatic.     Right Ear: External ear normal.     Left Ear: External ear normal.     Nose: Nose normal.  Eyes:     Conjunctiva/sclera: Conjunctivae normal.     Pupils: Pupils are equal, round, and reactive to light.  Cardiovascular:     Rate and Rhythm: Normal rate and regular rhythm.  Pulmonary:     Effort: Pulmonary effort is normal.  Abdominal:     General: Bowel sounds are normal.     Palpations: Abdomen is soft.  Genitourinary:    General: Normal vulva.     Vagina: Normal.     Cervix: Cervical bleeding present.     Uterus: Normal.      Adnexa: Right adnexa normal.     Comments: Mild bleeding noted Musculoskeletal:        General: Normal range of motion.     Cervical back: Normal range of motion and neck supple.  Skin:    General: Skin is warm and dry.  Neurological:     Mental Status: She is alert and oriented to person, place, and time.     Motor: No abnormal muscle tone.     Coordination: Coordination normal.  Psychiatric:        Behavior: Behavior normal.        Thought Content: Thought content normal.    ED Results / Procedures / Treatments   Labs (all labs ordered are listed, but only abnormal results are displayed) Labs Reviewed - No data to display  EKG None  Radiology No results found.  Procedures Procedures    Medications Ordered in ED Medications - No data to display  ED Course/ Medical Decision Making/ A&P                           Medical Decision Making Amount and/or Complexity of Data Reviewed Labs: ordered.  Risk Prescription drug management.   56 year old female presents today with abnormal vaginal bleeding.  This started 7 to 10 days ago.  She has been seen by primary care.  She has outpatient ultrasound and follow-up arranged.   Here in the ED she had her labs checked and physical exam was done.  Hemoglobin is stable.  Patient appears hemodynamically stable for discharge.  Given dose of Megace here in the ED and will be given prescription for same.  She is advised regarding return precautions including worsening pain or bleeding.  Additionally she is advised regarding the need to keep her outpatient follow-up appointments.  She voices  understanding of above.        Final Clinical Impression(s) / ED Diagnoses Final diagnoses:  None    Rx / DC Orders ED Discharge Orders     None         Pattricia Boss, MD 11/09/21 1046

## 2021-11-08 NOTE — Discharge Instructions (Signed)
Please keep your outpatient appointments as previously scheduled Return if you are having worsening, severe bleeding, worsening severe pain, you become lightheaded, have chest pain, passing out, or seems worse in any way.

## 2021-11-08 NOTE — ED Triage Notes (Signed)
Pt BIB [facility] from home with complaints of vaginal bleeding for 10 days now. When asking how often pt is changing sanitary pad or depends she states "quite a bit" and "it's kept me out of work for a week". Pt is symptomatic with bleeding, feeling lightheaded and dizzy. Pt did see OBGYN for this and they advised her to be seen at the ER. Pt Aox4. VSS with EMS.

## 2021-11-10 ENCOUNTER — Telehealth: Payer: Self-pay | Admitting: Family Medicine

## 2021-11-10 NOTE — Telephone Encounter (Signed)
Patient returns call to nurse line. Advised of below message. Patient states that appointment date and time works well for her.   Talbot Grumbling, RN

## 2021-11-10 NOTE — Telephone Encounter (Signed)
Called patient to discuss need for endometrial biopsy regardless of TVUS results because of age and risk factors. We have [place] clinic slots available on 2/23. No answer, VM box full. I will schedule her for 2/23 given the need for the endometrial biopsy. I will try to call again later to relay this information and possibly change the appointment time if she needs.   Ezequiel Essex, MD

## 2021-11-11 ENCOUNTER — Telehealth: Payer: Self-pay | Admitting: Family Medicine

## 2021-11-11 ENCOUNTER — Ambulatory Visit (HOSPITAL_COMMUNITY)
Admission: RE | Admit: 2021-11-11 | Discharge: 2021-11-11 | Disposition: A | Discharge status: Home / Self Care | Payer: Medicare Other | Source: Ambulatory Visit | Attending: Family Medicine | Admitting: Family Medicine

## 2021-11-11 ENCOUNTER — Other Ambulatory Visit: Payer: Self-pay

## 2021-11-11 DIAGNOSIS — N950 Postmenopausal bleeding: Secondary | ICD-10-CM | POA: Diagnosis present

## 2021-11-11 NOTE — Telephone Encounter (Signed)
Called patient to discuss results of TVUS. Endometrial thickness 4 mm, consistent with more benign etiology. Due to age and risk factors, will need to proceed with endometrial biopsy. Scheduled for 2/23 in colpo clinic.   No answer, left HIPAA safe VM.   Ezequiel Essex, MD

## 2021-11-12 NOTE — Telephone Encounter (Signed)
Patient returns call to nurse line. Advised of below and that further testing is recommended in colpo clinic.   Confirmed appointment with patient.   Talbot Grumbling, RN

## 2021-11-13 DIAGNOSIS — R0989 Other specified symptoms and signs involving the circulatory and respiratory systems: Secondary | ICD-10-CM | POA: Diagnosis not present

## 2021-11-13 DIAGNOSIS — R0789 Other chest pain: Secondary | ICD-10-CM | POA: Diagnosis not present

## 2021-11-13 DIAGNOSIS — I70219 Atherosclerosis of native arteries of extremities with intermittent claudication, unspecified extremity: Secondary | ICD-10-CM | POA: Diagnosis not present

## 2021-11-13 DIAGNOSIS — I739 Peripheral vascular disease, unspecified: Secondary | ICD-10-CM | POA: Diagnosis not present

## 2021-11-13 DIAGNOSIS — R079 Chest pain, unspecified: Secondary | ICD-10-CM | POA: Diagnosis not present

## 2021-11-13 DIAGNOSIS — G4733 Obstructive sleep apnea (adult) (pediatric): Secondary | ICD-10-CM | POA: Diagnosis not present

## 2021-11-13 DIAGNOSIS — G2581 Restless legs syndrome: Secondary | ICD-10-CM | POA: Diagnosis not present

## 2021-11-13 DIAGNOSIS — I5032 Chronic diastolic (congestive) heart failure: Secondary | ICD-10-CM | POA: Diagnosis not present

## 2021-11-13 DIAGNOSIS — R0602 Shortness of breath: Secondary | ICD-10-CM | POA: Diagnosis not present

## 2021-11-13 DIAGNOSIS — I10 Essential (primary) hypertension: Secondary | ICD-10-CM | POA: Diagnosis not present

## 2021-11-14 DIAGNOSIS — M24812 Other specific joint derangements of left shoulder, not elsewhere classified: Secondary | ICD-10-CM | POA: Diagnosis not present

## 2021-11-14 DIAGNOSIS — M75112 Incomplete rotator cuff tear or rupture of left shoulder, not specified as traumatic: Secondary | ICD-10-CM | POA: Diagnosis not present

## 2021-11-17 DIAGNOSIS — Z7985 Long-term (current) use of injectable non-insulin antidiabetic drugs: Secondary | ICD-10-CM | POA: Diagnosis not present

## 2021-11-17 DIAGNOSIS — E119 Type 2 diabetes mellitus without complications: Secondary | ICD-10-CM | POA: Diagnosis not present

## 2021-11-17 LAB — HM DIABETES EYE EXAM

## 2021-11-19 DIAGNOSIS — R42 Dizziness and giddiness: Secondary | ICD-10-CM | POA: Diagnosis not present

## 2021-11-19 DIAGNOSIS — R296 Repeated falls: Secondary | ICD-10-CM | POA: Diagnosis not present

## 2021-11-19 DIAGNOSIS — M899 Disorder of bone, unspecified: Secondary | ICD-10-CM | POA: Diagnosis not present

## 2021-11-20 ENCOUNTER — Ambulatory Visit: Payer: Medicare Other

## 2021-11-21 ENCOUNTER — Encounter: Payer: Self-pay | Admitting: Family Medicine

## 2021-11-21 DIAGNOSIS — M25512 Pain in left shoulder: Secondary | ICD-10-CM | POA: Diagnosis not present

## 2021-11-21 DIAGNOSIS — G8929 Other chronic pain: Secondary | ICD-10-CM | POA: Diagnosis not present

## 2021-11-21 DIAGNOSIS — M75122 Complete rotator cuff tear or rupture of left shoulder, not specified as traumatic: Secondary | ICD-10-CM | POA: Diagnosis not present

## 2021-11-21 DIAGNOSIS — M24812 Other specific joint derangements of left shoulder, not elsewhere classified: Secondary | ICD-10-CM | POA: Diagnosis not present

## 2021-11-21 NOTE — Progress Notes (Signed)
Patient has no-showed to multiple appointments in a 6-month period. Per our no-show policy, a letter has been routed to [facility] Admin to be mailed to patient regarding likely dismissal for repeat no show. Will CC to PCP.  ° °

## 2021-11-24 DIAGNOSIS — R2689 Other abnormalities of gait and mobility: Secondary | ICD-10-CM | POA: Diagnosis not present

## 2021-11-26 ENCOUNTER — Encounter: Payer: Self-pay | Admitting: Family Medicine

## 2021-11-26 ENCOUNTER — Other Ambulatory Visit: Payer: Self-pay | Admitting: Family Medicine

## 2021-11-26 DIAGNOSIS — G8929 Other chronic pain: Secondary | ICD-10-CM

## 2021-11-26 DIAGNOSIS — R600 Localized edema: Secondary | ICD-10-CM

## 2021-11-27 ENCOUNTER — Ambulatory Visit: Payer: Medicare Other

## 2021-12-11 DIAGNOSIS — H524 Presbyopia: Secondary | ICD-10-CM | POA: Diagnosis not present

## 2021-12-11 DIAGNOSIS — H5203 Hypermetropia, bilateral: Secondary | ICD-10-CM | POA: Diagnosis not present

## 2021-12-12 DIAGNOSIS — H5213 Myopia, bilateral: Secondary | ICD-10-CM | POA: Diagnosis not present

## 2021-12-17 NOTE — Progress Notes (Deleted)
? ? ?  SUBJECTIVE:  ? ?CHIEF COMPLAINT / HPI:  ? ?*** ? ?Hello Dr. Jeani Hawking,    ?   ?The Pharmacy team is conducting a quality improvement initiative. The recommendation below is for your consideration.  ? ?This patient with Hypertension and CKD may potentially benefit from additional medication therapy.  ?and has an appointment with you on 12/18/2021.  ? ?If appropriate, please consider the additional therapy recommendations below:  ?Consider SGLT2 for CKD prevention.   ?Potentially consider collection of Microalbuminura/Creatinine ratio after start of therapy.  ? ?Thanks!  ? ?PERTINENT  PMH / PSH: *** ? ?OBJECTIVE:  ? ?LMP 12/03/2016 (Approximate)   ?*** ? ?ASSESSMENT/PLAN:  ? ?No problem-specific Assessment & Plan notes found for this encounter. ?  ? ? ?Ezequiel Essex, MD ?[place]  ?

## 2021-12-18 ENCOUNTER — Telehealth: Payer: Self-pay | Admitting: Podiatry

## 2021-12-18 ENCOUNTER — Ambulatory Visit: Payer: Medicare Other | Admitting: Family Medicine

## 2021-12-18 NOTE — Telephone Encounter (Signed)
Pt called asking about her diabetic shoes. She has not heard anything.  ?

## 2021-12-19 NOTE — Telephone Encounter (Signed)
Lvm for pt to call back to start process over.

## 2021-12-22 ENCOUNTER — Other Ambulatory Visit: Payer: Self-pay

## 2021-12-22 ENCOUNTER — Ambulatory Visit (INDEPENDENT_AMBULATORY_CARE_PROVIDER_SITE_OTHER): Payer: Medicare Other | Admitting: Family Medicine

## 2021-12-22 ENCOUNTER — Encounter: Payer: Self-pay | Admitting: Family Medicine

## 2021-12-22 VITALS — BP 158/72 | HR 88 | Ht 60.0 in | Wt 230.4 lb

## 2021-12-22 DIAGNOSIS — M25512 Pain in left shoulder: Secondary | ICD-10-CM

## 2021-12-22 DIAGNOSIS — B351 Tinea unguium: Secondary | ICD-10-CM

## 2021-12-22 DIAGNOSIS — G4733 Obstructive sleep apnea (adult) (pediatric): Secondary | ICD-10-CM | POA: Diagnosis not present

## 2021-12-22 DIAGNOSIS — K219 Gastro-esophageal reflux disease without esophagitis: Secondary | ICD-10-CM | POA: Diagnosis not present

## 2021-12-22 DIAGNOSIS — J449 Chronic obstructive pulmonary disease, unspecified: Secondary | ICD-10-CM

## 2021-12-22 DIAGNOSIS — M75122 Complete rotator cuff tear or rupture of left shoulder, not specified as traumatic: Secondary | ICD-10-CM | POA: Diagnosis not present

## 2021-12-22 DIAGNOSIS — M542 Cervicalgia: Secondary | ICD-10-CM

## 2021-12-22 DIAGNOSIS — E119 Type 2 diabetes mellitus without complications: Secondary | ICD-10-CM

## 2021-12-22 DIAGNOSIS — G8929 Other chronic pain: Secondary | ICD-10-CM | POA: Diagnosis not present

## 2021-12-22 DIAGNOSIS — N950 Postmenopausal bleeding: Secondary | ICD-10-CM

## 2021-12-22 DIAGNOSIS — K30 Functional dyspepsia: Secondary | ICD-10-CM

## 2021-12-22 DIAGNOSIS — M549 Dorsalgia, unspecified: Secondary | ICD-10-CM

## 2021-12-22 MED ORDER — BUDESONIDE-FORMOTEROL FUMARATE 160-4.5 MCG/ACT IN AERO: 2.0000 | INHALATION_SPRAY | Freq: Two times a day (BID) | RESPIRATORY_TRACT | 2 refills | Status: DC

## 2021-12-22 MED ORDER — ONDANSETRON HCL 8 MG PO TABS: 8.0000 mg | ORAL_TABLET | Freq: Three times a day (TID) | ORAL | 0 refills | Status: DC | PRN

## 2021-12-22 MED ORDER — ALBUTEROL SULFATE 1.25 MG/3ML IN NEBU: 1.0000 | INHALATION_SOLUTION | Freq: Four times a day (QID) | RESPIRATORY_TRACT | 1 refills | Status: DC | PRN

## 2021-12-22 MED ORDER — TRAMADOL HCL 50 MG PO TABS: 50.0000 mg | ORAL_TABLET | Freq: Two times a day (BID) | ORAL | 0 refills | Status: DC | PRN

## 2021-12-22 MED ORDER — OMEPRAZOLE 20 MG PO CPDR: 20.0000 mg | DELAYED_RELEASE_CAPSULE | Freq: Every day | ORAL | 3 refills | Status: DC

## 2021-12-22 MED ORDER — CYCLOBENZAPRINE HCL 10 MG PO TABS: 10.0000 mg | ORAL_TABLET | Freq: Two times a day (BID) | ORAL | 0 refills | Status: DC | PRN

## 2021-12-22 NOTE — Progress Notes (Signed)
? ? ?SUBJECTIVE:  ? ?CHIEF COMPLAINT / HPI:  ? ?"Med review" ?- pain out of control ?- shoulder in "excruciating pain", plus chronic neck and back ?- gabapentin 600 mg TID providing no relief at all ?- out of muscle relaxer, requesting more ?- [facility] visit 11/21/21, dx complete tear of rotator cuff, referred to orthopedic surgeon and physical therapy; has not heard from orthopedic surgeon for scheduling, heard from the PT but didn't want to schedule because of concurrent life stressors ?- reports baclofen used previously provided no relief ?- would prefer flexeril ? ?AUB ?- TVUS endometrial stripe 4 mm ?- sched for endometrial biopsy 2/23, but no showed as she forgot ? ?Sour stomach ?- reports 1-2 week history of "sour stomach" with belching and nausea ?- last GI visit last summer ?- has diarrhea as part of IBS-D, but does not believe this to be worsened from baseline ?- no vomiting, HA, fever, chills, abdominal pain, weight loss ?- med list reviewed, no acid reducer or nausea medication on list ?- patient cannot recall any new diagnoses, medication changes, or recent prior illnesses prior to onset ? ?COPD ?- patient request refills of albuterol nebulizer solution and Symbicort inhaler ?- she also needs script for new nebulizer machine ?- worsened COPD over the last month or so, increased use of albuterol inhaler, now couple times per week ? ?PERTINENT  PMH / PSH:  ?Patient Active Problem List  ? Diagnosis Date Noted  ? Post-menopausal bleeding 11/05/2021  ? Shoulder pain, left 10/28/2021  ? 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 Diabetic polyneuropathy associated with type 2 diabetes mellitus ([place]) 12/27/2020  ? (HFpEF) heart failure with preserved ejection fraction ([place]) 06/12/2020  ? Meralgia paraesthetica 11/10/2018  ? Bipolar affective disorder, depressed ([place]) 08/27/2018  ? Onychomycosis 02/14/2018  ? Type 2 diabetes mellitus without  complication, without long-term current use of insulin ([place]) 01/27/2018  ? COPD (chronic obstructive pulmonary disease) ([place]) 12/29/2016  ? Mixed hyperlipidemia - secondary prevention of LDL <70 12/29/2016  ? Gastro-esophageal reflux disease without esophagitis 08/09/2016  ? Carotid bruit 08/09/2016  ? PAD (peripheral artery disease) ([place]) 02/25/2016  ? Obstructive sleep apnea, adult 02/07/2016  ? HTN, goal below 140/90 01/23/2016  ? Atherosclerosis with limb claudication ([place]) 01/23/2016  ? Claudication in peripheral vascular disease ([place]) 01/06/2016  ? Hearing loss 11/27/2015  ? Morbid obesity ([place]) 10/29/2015  ? Excessive daytime sleepiness 10/05/2015  ? Chronic diarrhea   ? Urinary incontinence 04/08/2015  ? Bilateral chronic knee pain 12/07/2014  ? IBS (irritable bowel syndrome)   ? Chronic back pain 05/14/2014  ?  ?OBJECTIVE:  ? ?BP (!) 158/72   Pulse 88   Ht 5' (1.524 m)   Wt 230 lb 6.4 oz (104.5 kg)   LMP 12/03/2016 (Approximate)   SpO2 98%   BMI 45.00 kg/m?   ? ?PHQ-9:  ? ?  12/22/2021  ? 11:04 AM 11/04/2021  ?  4:47 PM 10/28/2021  ?  3:28 PM  ?Depression screen PHQ 2/9  ?Decreased Interest 3 0 1  ?Down, Depressed, Hopeless 2 0 2  ?PHQ - 2 Score 5 0 3  ?Altered sleeping 3 0 0  ?Tired, decreased energy 3 0 2  ?Change in appetite 0 0 0  ?Feeling bad or failure about yourself  0 0 0  ?Trouble concentrating 3 0 0  ?Moving slowly or fidgety/restless 0 0 0  ?Suicidal thoughts 0 0 0  ?PHQ-9  Score 14 0 5  ?Difficult doing work/chores Extremely dIfficult Not difficult at all Very difficult  ?  ?GAD-7:  ?   ? View : No data to display.  ?  ?  ?  ? ?Physical Exam ?General: Awake, alert, oriented ?Cardiovascular: Regular rate and rhythm, S1 and S2 present, no murmurs auscultated ?Respiratory: Lung fields clear to auscultation bilaterally ? ?ASSESSMENT/PLAN:  ? ?Chronic back pain ?Acute on chronic.  Patient with history of cervical and thoracic spinal issues, chronic low back pain, chronic neck pain, and now has been  diagnosed with a complete rotator cuff tear by [facility] 2/24.  Patient is reporting that gabapentin is not currently providing any relief.  Previously trialed off Flexeril and taper down gabapentin given concerns for daytime somnolence.  Given acute on chronic pain symptoms, will prescribe short course of Flexeril and tramadol.  Due to multiple complex etiologies of pain, discussed with patient referral to pain management clinic.  Patient amenable, referral placed. ? ?Gastro-esophageal reflux disease without esophagitis ?Acute on chronic.  Suspect "sour stomach" due to worsened acid reflux.  Not currently taking PPI or H2 blocker.  Encouraged patient to make appointment with GI, last appointment was last summer.  Rx omeprazole and Zofran for current symptoms. ? ?Post-menopausal bleeding ?Per chart review, patient no showed endometrial biopsy 2/23.  Discussed with patient today, she reports she forgot due to her plethora of other medical appointments.  Rescheduled with patient before AVS printed. Stressed importance of completing this biopsy. See AVS for more.  ? ?COPD (chronic obstructive pulmonary disease) ([place]) ?Refilled meds per patient request.  Encourage patient to return for appointment if she feels her COPD is worsening.  Return precautions given.  See AVS for more. ? ?Onychomycosis ?Completed terbinafine 2-3 weeks ago per patient.  Obtained LFTs today. ?  ? ? ?Ezequiel Essex, MD ?[place]  ?

## 2021-12-22 NOTE — Patient Instructions (Signed)
It was wonderful to see you today. Thank you for allowing me to be a part of your care. Below is a short summary of what we discussed at your visit today: ? ?Acute and Chronic pain ?I have sent a script for flexeril and tramadol. This should help with your chronic pain and the newer pain from your complete torn rotator cuff until you can get in with the pain clinic.  ? ?I have referred you to the pain clinic for pain med management.  Someone from their office should be calling you in 1 to 2 weeks to schedule an appointment.  If you do not hear from them, let us know. We may need to nudge along the referral.   ? ?Sour stomach ?Please make an appointment to see your GI doctor soon. I have sent a prescription for zofran (to treat nausea) and prilosec (to reduce stomach acid).  ? ?Abnormal Uterine Bleeding ?We have rescheduled your endometrial biopsy for this Thursday. Please see the next page for details. It will also be on your MyChart phone app.  ? ?COPD ?I have sent refills of your symbicort inhaler and the new script for the nebulizer machine to your pharmacy. Please let us know if you have problems at the pharmacy with this.  ? ? ?Please bring all of your medications to every appointment! ? ?If you have any questions or concerns, please do not hesitate to contact us via phone or MyChart message.  ? ?Ezequiel Essex, MD  ?

## 2021-12-23 ENCOUNTER — Other Ambulatory Visit: Payer: Medicare Other

## 2021-12-23 LAB — HEPATIC FUNCTION PANEL
ALT: 16 IU/L (ref 0–32)
AST: 12 IU/L (ref 0–40)
Albumin: 3.7 g/dL — ABNORMAL LOW (ref 3.8–4.9)
Alkaline Phosphatase: 93 IU/L (ref 44–121)
Bilirubin Total: <0.2 mg/dL (ref 0.0–1.2)
Bilirubin, Direct: <0.1 mg/dL (ref 0.00–0.40)
Total Protein: 6.7 g/dL (ref 6.0–8.5)

## 2021-12-23 LAB — HEMOGLOBIN A1C
Est. average glucose Bld gHb Est-mCnc: 131 mg/dL
Hgb A1c MFr Bld: 6.2 % — ABNORMAL HIGH (ref 4.8–5.6)

## 2021-12-23 NOTE — Assessment & Plan Note (Signed)
Acute on chronic.  Patient with history of cervical and thoracic spinal issues, chronic low back pain, chronic neck pain, and now has been diagnosed with a complete rotator cuff tear by [facility] 2/24.  Patient is reporting that gabapentin is not currently providing any relief.  Previously trialed off Flexeril and taper down gabapentin given concerns for daytime somnolence.  Given acute on chronic pain symptoms, will prescribe short course of Flexeril and tramadol.  Due to multiple complex etiologies of pain, discussed with patient referral to pain management clinic.  Patient amenable, referral placed. ?

## 2021-12-23 NOTE — Assessment & Plan Note (Signed)
Per chart review, patient no showed endometrial biopsy 2/23.  Discussed with patient today, she reports she forgot due to her plethora of other medical appointments.  Rescheduled with patient before AVS printed. Stressed importance of completing this biopsy. See AVS for more.  ?

## 2021-12-23 NOTE — Assessment & Plan Note (Signed)
Refilled meds per patient request.  Encourage patient to return for appointment if she feels her COPD is worsening.  Return precautions given.  See AVS for more. ?

## 2021-12-23 NOTE — Assessment & Plan Note (Signed)
Completed terbinafine 2-3 weeks ago per patient.  Obtained LFTs today. ?

## 2021-12-23 NOTE — Assessment & Plan Note (Signed)
Acute on chronic.  Suspect "sour stomach" due to worsened acid reflux.  Not currently taking PPI or H2 blocker.  Encouraged patient to make appointment with GI, last appointment was last summer.  Rx omeprazole and Zofran for current symptoms. ?

## 2021-12-25 ENCOUNTER — Ambulatory Visit: Payer: Medicare Other

## 2022-01-01 ENCOUNTER — Ambulatory Visit: Payer: Medicare Other

## 2022-01-03 DIAGNOSIS — R52 Pain, unspecified: Secondary | ICD-10-CM | POA: Diagnosis not present

## 2022-01-03 DIAGNOSIS — E119 Type 2 diabetes mellitus without complications: Secondary | ICD-10-CM | POA: Diagnosis not present

## 2022-01-03 DIAGNOSIS — J9811 Atelectasis: Secondary | ICD-10-CM | POA: Diagnosis not present

## 2022-01-03 DIAGNOSIS — M1990 Unspecified osteoarthritis, unspecified site: Secondary | ICD-10-CM | POA: Diagnosis not present

## 2022-01-03 DIAGNOSIS — R0789 Other chest pain: Secondary | ICD-10-CM | POA: Diagnosis not present

## 2022-01-03 DIAGNOSIS — I509 Heart failure, unspecified: Secondary | ICD-10-CM | POA: Diagnosis not present

## 2022-01-03 DIAGNOSIS — Z743 Need for continuous supervision: Secondary | ICD-10-CM | POA: Diagnosis not present

## 2022-01-03 DIAGNOSIS — R079 Chest pain, unspecified: Secondary | ICD-10-CM | POA: Diagnosis not present

## 2022-01-03 DIAGNOSIS — G4733 Obstructive sleep apnea (adult) (pediatric): Secondary | ICD-10-CM | POA: Diagnosis not present

## 2022-01-03 DIAGNOSIS — M62838 Other muscle spasm: Secondary | ICD-10-CM | POA: Diagnosis not present

## 2022-01-03 DIAGNOSIS — I110 Hypertensive heart disease with heart failure: Secondary | ICD-10-CM | POA: Diagnosis not present

## 2022-01-06 ENCOUNTER — Encounter: Payer: Self-pay | Admitting: Physical Medicine & Rehabilitation

## 2022-01-13 DIAGNOSIS — E0843 Diabetes mellitus due to underlying condition with diabetic autonomic (poly)neuropathy: Secondary | ICD-10-CM | POA: Diagnosis not present

## 2022-01-13 DIAGNOSIS — M24812 Other specific joint derangements of left shoulder, not elsewhere classified: Secondary | ICD-10-CM | POA: Diagnosis not present

## 2022-01-13 DIAGNOSIS — M75122 Complete rotator cuff tear or rupture of left shoulder, not specified as traumatic: Secondary | ICD-10-CM | POA: Diagnosis not present

## 2022-01-13 DIAGNOSIS — G8929 Other chronic pain: Secondary | ICD-10-CM | POA: Diagnosis not present

## 2022-01-13 DIAGNOSIS — M25512 Pain in left shoulder: Secondary | ICD-10-CM | POA: Diagnosis not present

## 2022-01-14 DIAGNOSIS — K580 Irritable bowel syndrome with diarrhea: Secondary | ICD-10-CM | POA: Diagnosis not present

## 2022-01-15 ENCOUNTER — Other Ambulatory Visit: Payer: Self-pay | Admitting: Family Medicine

## 2022-01-15 ENCOUNTER — Ambulatory Visit (INDEPENDENT_AMBULATORY_CARE_PROVIDER_SITE_OTHER): Payer: Medicare Other | Admitting: Family Medicine

## 2022-01-15 DIAGNOSIS — N950 Postmenopausal bleeding: Secondary | ICD-10-CM

## 2022-01-15 NOTE — Patient Instructions (Signed)
Follow these instructions at home: ?Take over-the-counter and prescription medicines only as told by your health care provider. ?Do not douche, use tampons, or have sexual intercourse until your health care provider approves. ?Return to your normal activities as told by your health care provider. Ask your health care provider what activities are safe for you. ?Follow instructions from your health care provider about any activity restrictions, such as restrictions on strenuous exercise or heavy lifting. ?Keep all follow-up visits. This is important. ?Contact a health care provider: ?You have heavy bleeding, or bleed for longer than 2 days after the procedure. ?You have bad smelling discharge from your vagina. ?You have a fever or chills. ?You have a burning sensation when urinating or you have difficulty urinating. ?You have severe pain in your lower abdomen. ?

## 2022-01-15 NOTE — Progress Notes (Signed)
In February 2023 she had acute onset of crampy vaginal bleeding.  It lasted about 5 days.  She had recurrence of that bleeding about 3 to 4 weeks later.  She has had no bleeding since then.  She has had the pelvic ultrasound done. ? ?PROCEDURE NOTE: Endometrial Biopsy ?Patient given informed consent, signed copy in the chart.  Appropriate time out taken. . The patient was placed in the lithotomy position and the cervix brought into view with sterile speculum.  The portio of cervix was cleansed x 3 with betadine swabs.  A tenaculum was placed in the anterior lip of the cervix.  A uterine sound was used to measure the uterus.. A pipelle was introduced  into the uterus, suction created,  and an endometrial sample was obtained. All equipment was removed and accounted for.  ? The patient tolerated the procedure well.  Minimal spotting type bleeding.  Patient given post procedure instructions. I will notify her of any pathology results. ? ?

## 2022-01-16 DIAGNOSIS — M25512 Pain in left shoulder: Secondary | ICD-10-CM | POA: Diagnosis not present

## 2022-01-16 DIAGNOSIS — G8929 Other chronic pain: Secondary | ICD-10-CM | POA: Diagnosis not present

## 2022-01-16 DIAGNOSIS — M542 Cervicalgia: Secondary | ICD-10-CM | POA: Diagnosis not present

## 2022-01-20 ENCOUNTER — Telehealth: Payer: Self-pay

## 2022-01-20 ENCOUNTER — Other Ambulatory Visit: Payer: Self-pay | Admitting: Family Medicine

## 2022-01-20 DIAGNOSIS — M75122 Complete rotator cuff tear or rupture of left shoulder, not specified as traumatic: Secondary | ICD-10-CM

## 2022-01-20 DIAGNOSIS — M25512 Pain in left shoulder: Secondary | ICD-10-CM

## 2022-01-20 DIAGNOSIS — G8929 Other chronic pain: Secondary | ICD-10-CM

## 2022-01-20 NOTE — Telephone Encounter (Signed)
Pharmacy calls nurse line requesting prescription chnage.  ? ?Ozempic '2mg'$ /1.69m is no longer available by manufacture.  ? ?Please send in Ozempic '2mg'$ /359m ?

## 2022-01-22 ENCOUNTER — Encounter: Payer: Self-pay | Admitting: Family Medicine

## 2022-01-22 DIAGNOSIS — G8929 Other chronic pain: Secondary | ICD-10-CM | POA: Diagnosis not present

## 2022-01-22 DIAGNOSIS — Z79899 Other long term (current) drug therapy: Secondary | ICD-10-CM | POA: Diagnosis not present

## 2022-01-22 DIAGNOSIS — M25512 Pain in left shoulder: Secondary | ICD-10-CM | POA: Diagnosis not present

## 2022-01-22 DIAGNOSIS — I2510 Atherosclerotic heart disease of native coronary artery without angina pectoris: Secondary | ICD-10-CM | POA: Diagnosis not present

## 2022-01-22 DIAGNOSIS — R0602 Shortness of breath: Secondary | ICD-10-CM | POA: Diagnosis not present

## 2022-01-22 DIAGNOSIS — H524 Presbyopia: Secondary | ICD-10-CM | POA: Diagnosis not present

## 2022-01-22 DIAGNOSIS — M75122 Complete rotator cuff tear or rupture of left shoulder, not specified as traumatic: Secondary | ICD-10-CM | POA: Diagnosis not present

## 2022-01-22 DIAGNOSIS — F17210 Nicotine dependence, cigarettes, uncomplicated: Secondary | ICD-10-CM | POA: Diagnosis not present

## 2022-01-22 DIAGNOSIS — R079 Chest pain, unspecified: Secondary | ICD-10-CM | POA: Diagnosis not present

## 2022-01-22 DIAGNOSIS — S46112A Strain of muscle, fascia and tendon of long head of biceps, left arm, initial encounter: Secondary | ICD-10-CM | POA: Diagnosis not present

## 2022-01-22 DIAGNOSIS — X58XXXA Exposure to other specified factors, initial encounter: Secondary | ICD-10-CM | POA: Diagnosis not present

## 2022-01-22 MED ORDER — OZEMPIC (0.25 OR 0.5 MG/DOSE) 2 MG/3ML ~~LOC~~ SOPN: 0.5000 mg | PEN_INJECTOR | SUBCUTANEOUS | 3 refills | Status: DC

## 2022-01-22 NOTE — Progress Notes (Signed)
Addendum note: Notified by Dr. Saralyn Pilar in pathology yesterday the unfortunately the endometrial biopsy sample we sent from last 30 days procedure was destroyed accidentally in processing.  Evidently had an equipment failure and lost several samples.  They did offer to obviously repeat Pete the next sample if we desired to send 1 at no cost.  I called Elizabeth Diaz our this morning and spoke with her at length.  I gave her the option of repeating it but I gave her the option also of just conservatively following her symptomatically.  She had some spotting and mild cramping the day after but has had no vaginal bleeding since then.   ? ?Review of her imaging shows her endometrial stripe less than 4 mm.  Based on literature review, consensus is that if it is less than 5 mm, incidence of endometrial cancer is extremely low. (ACOG bulletin referenced below. Endopmetrial stripe less than or equal to 68m 99% negative predictive value.  I ? ? told her that we did the biopsy as an additional method to be certain her vaginal bleeding was not cancer.  I would be happy to repeat the biopsy if she wants to go down that path.  I also told her I think it is absol;utely safe to follow this closely , especially given her PUS findings. Additionally, her endometrial biopsy material was quite scant, not much cellularity (did not hold together in sample cup) which I also find reassuring.  ? ?After discussion, she agreed to set an appointment to follow-up with me in 2 to 3 months.   ?In the interim if she has any vaginal spotting or bleeding at all she will let me know and we will repeat the procedure., refer etc.    ? ?At the 352-monthollow-up, I want to make sure that she still comfortable with this plan.  I also want to evaluate Diaz any occult bleeding that she might of had in just forgot to call [place].  I will send her all of this in a letter.  She expressed understanding.  Answered all questions. ?References: ACOG COMMITTEE OPINION   Number [id number]  May 2018 Number [id number] Hillsumber [id number] 2009. Reaffirmed 2023) ? ?

## 2022-01-25 ENCOUNTER — Telehealth: Payer: Self-pay | Admitting: Family Medicine

## 2022-01-25 NOTE — Telephone Encounter (Signed)
Attempted to call pathology laboratory regarding results of endometrial biopsy.  I received the below final diagnosis without any explanation.  Unclear on whether or not any sort of histologic examination was possible.  Attempted to call laboratory at 1 [phone], however was unable to leave voicemail.  Will call back during normal business hours as able on inpatient service. ? ? ? ?Ezequiel Essex, MD ? ?

## 2022-01-26 ENCOUNTER — Encounter: Payer: Medicare Other | Admitting: Physical Medicine & Rehabilitation

## 2022-02-03 DIAGNOSIS — Z8639 Personal history of other endocrine, nutritional and metabolic disease: Secondary | ICD-10-CM | POA: Diagnosis not present

## 2022-02-03 DIAGNOSIS — M19019 Primary osteoarthritis, unspecified shoulder: Secondary | ICD-10-CM | POA: Diagnosis not present

## 2022-02-03 DIAGNOSIS — R7309 Other abnormal glucose: Secondary | ICD-10-CM | POA: Diagnosis not present

## 2022-02-05 ENCOUNTER — Encounter: Payer: Self-pay | Admitting: Family Medicine

## 2022-02-05 ENCOUNTER — Other Ambulatory Visit: Payer: Self-pay | Admitting: Family Medicine

## 2022-02-05 ENCOUNTER — Encounter: Payer: Medicare Other | Admitting: Physical Medicine & Rehabilitation

## 2022-02-05 DIAGNOSIS — M75122 Complete rotator cuff tear or rupture of left shoulder, not specified as traumatic: Secondary | ICD-10-CM

## 2022-02-05 DIAGNOSIS — G8929 Other chronic pain: Secondary | ICD-10-CM

## 2022-02-05 DIAGNOSIS — M25512 Pain in left shoulder: Secondary | ICD-10-CM

## 2022-02-05 NOTE — Progress Notes (Signed)
Patient has no-showed to multiple appointments in a 68-monthperiod. Per our no-show policy, patient will be dismissed from our practice at this time. Will CC to PCP.  ? ?

## 2022-02-06 ENCOUNTER — Encounter: Payer: Medicare Other | Attending: Physical Medicine & Rehabilitation | Admitting: Physical Medicine & Rehabilitation

## 2022-02-06 ENCOUNTER — Other Ambulatory Visit: Payer: Self-pay | Admitting: *Deleted

## 2022-02-06 ENCOUNTER — Telehealth: Payer: Self-pay | Admitting: *Deleted

## 2022-02-06 NOTE — Telephone Encounter (Signed)
Patient has no-showed to multiple appointments in a 26-monthperiod. Per our no-show policy, patient will be dismissed from our practice at this time. ? ?Letter given to [facility] to send certified. ? ?Original message from Dr. ENita Sells ? ?JChristen Bame CMA ?

## 2022-02-06 NOTE — Telephone Encounter (Signed)
Also received a request for tramadol but did not see on current med list. Lusero Nordlund Katharina Caper, CMA ? ? ?Tramadol ?'50mg'$   ?Qty-10 ?

## 2022-03-02 ENCOUNTER — Other Ambulatory Visit: Payer: Self-pay | Admitting: *Deleted

## 2022-03-02 DIAGNOSIS — E119 Type 2 diabetes mellitus without complications: Secondary | ICD-10-CM

## 2022-03-02 DIAGNOSIS — M75122 Complete rotator cuff tear or rupture of left shoulder, not specified as traumatic: Secondary | ICD-10-CM

## 2022-03-02 DIAGNOSIS — G8929 Other chronic pain: Secondary | ICD-10-CM

## 2022-03-02 DIAGNOSIS — M25512 Pain in left shoulder: Secondary | ICD-10-CM

## 2022-03-02 DIAGNOSIS — E782 Mixed hyperlipidemia: Secondary | ICD-10-CM

## 2022-03-02 DIAGNOSIS — K30 Functional dyspepsia: Secondary | ICD-10-CM

## 2022-03-02 DIAGNOSIS — E1142 Type 2 diabetes mellitus with diabetic polyneuropathy: Secondary | ICD-10-CM

## 2022-03-02 NOTE — Telephone Encounter (Signed)
Pt called in wanting these refills and wanted them to be sent to a [facility] in [place]. She was unable to give me the address and said she would call me right back. Didn't receive a call back. Please reach out to pt to get address. Persis Graffius Kennon Holter, CMA

## 2022-03-03 ENCOUNTER — Encounter: Payer: Self-pay | Admitting: *Deleted

## 2022-03-04 DIAGNOSIS — E119 Type 2 diabetes mellitus without complications: Secondary | ICD-10-CM

## 2022-03-04 DIAGNOSIS — E1142 Type 2 diabetes mellitus with diabetic polyneuropathy: Secondary | ICD-10-CM

## 2022-03-04 DIAGNOSIS — M25512 Pain in left shoulder: Secondary | ICD-10-CM

## 2022-03-04 DIAGNOSIS — G8929 Other chronic pain: Secondary | ICD-10-CM

## 2022-03-04 DIAGNOSIS — M75122 Complete rotator cuff tear or rupture of left shoulder, not specified as traumatic: Secondary | ICD-10-CM

## 2022-03-04 NOTE — Telephone Encounter (Signed)
Error

## 2022-03-05 MED ORDER — ATORVASTATIN CALCIUM 80 MG PO TABS: 80.0000 mg | ORAL_TABLET | Freq: Every day | ORAL | 3 refills | Status: DC

## 2022-03-05 MED ORDER — ONDANSETRON HCL 8 MG PO TABS: 8.0000 mg | ORAL_TABLET | Freq: Three times a day (TID) | ORAL | 0 refills | Status: DC | PRN

## 2022-03-05 MED ORDER — HYDROXYZINE PAMOATE 25 MG PO CAPS: 25.0000 mg | ORAL_CAPSULE | Freq: Three times a day (TID) | ORAL | 0 refills | Status: DC | PRN

## 2022-03-05 MED ORDER — CYCLOBENZAPRINE HCL 10 MG PO TABS: ORAL_TABLET | ORAL | 0 refills | Status: DC

## 2022-03-05 MED ORDER — CANAGLIFLOZIN 100 MG PO TABS
100.0000 mg | ORAL_TABLET | Freq: Every day | ORAL | 0 refills | Status: DC
Start: 2022-03-05 — End: 2022-04-07

## 2022-03-05 MED ORDER — OZEMPIC (0.25 OR 0.5 MG/DOSE) 2 MG/3ML ~~LOC~~ SOPN: 0.5000 mg | PEN_INJECTOR | SUBCUTANEOUS | 3 refills | Status: DC

## 2022-03-05 MED ORDER — GABAPENTIN 600 MG PO TABS: 600.0000 mg | ORAL_TABLET | Freq: Three times a day (TID) | ORAL | 0 refills | Status: DC

## 2022-03-19 ENCOUNTER — Other Ambulatory Visit: Payer: Self-pay

## 2022-03-19 DIAGNOSIS — G8929 Other chronic pain: Secondary | ICD-10-CM

## 2022-03-19 DIAGNOSIS — M75122 Complete rotator cuff tear or rupture of left shoulder, not specified as traumatic: Secondary | ICD-10-CM

## 2022-03-19 DIAGNOSIS — M25512 Pain in left shoulder: Secondary | ICD-10-CM

## 2022-03-23 ENCOUNTER — Ambulatory Visit: Payer: Medicare Other | Admitting: Family Medicine

## 2022-03-23 NOTE — Progress Notes (Deleted)
    SUBJECTIVE:   CHIEF COMPLAINT / HPI:   ***  PERTINENT  PMH / PSH: ***  OBJECTIVE:   LMP 12/03/2016 (Approximate)    General: Alert, no acute distress Cardio: Normal S1 and S2, RRR, no r/m/g Pulm: CTAB, normal work of breathing Abdomen: Bowel sounds normal. Abdomen soft and non-tender.  Extremities: No peripheral edema.  Neuro: Cranial nerves grossly intact   ASSESSMENT/PLAN:   No problem-specific Assessment & Plan notes found for this encounter.     Carollee Leitz, MD [place]

## 2022-03-26 ENCOUNTER — Encounter: Payer: Self-pay | Admitting: Family Medicine

## 2022-03-26 ENCOUNTER — Ambulatory Visit (INDEPENDENT_AMBULATORY_CARE_PROVIDER_SITE_OTHER): Payer: Medicare Other | Admitting: Family Medicine

## 2022-03-26 VITALS — BP 146/80 | HR 100 | Wt 227.0 lb

## 2022-03-26 DIAGNOSIS — F315 Bipolar disorder, current episode depressed, severe, with psychotic features: Secondary | ICD-10-CM

## 2022-03-26 DIAGNOSIS — G8929 Other chronic pain: Secondary | ICD-10-CM | POA: Diagnosis not present

## 2022-03-26 DIAGNOSIS — M25512 Pain in left shoulder: Secondary | ICD-10-CM

## 2022-03-26 DIAGNOSIS — M549 Dorsalgia, unspecified: Secondary | ICD-10-CM

## 2022-03-26 DIAGNOSIS — E119 Type 2 diabetes mellitus without complications: Secondary | ICD-10-CM | POA: Diagnosis not present

## 2022-03-26 DIAGNOSIS — M25511 Pain in right shoulder: Secondary | ICD-10-CM | POA: Diagnosis not present

## 2022-03-26 DIAGNOSIS — J449 Chronic obstructive pulmonary disease, unspecified: Secondary | ICD-10-CM | POA: Diagnosis not present

## 2022-03-26 DIAGNOSIS — M75122 Complete rotator cuff tear or rupture of left shoulder, not specified as traumatic: Secondary | ICD-10-CM

## 2022-03-26 DIAGNOSIS — G4733 Obstructive sleep apnea (adult) (pediatric): Secondary | ICD-10-CM | POA: Diagnosis not present

## 2022-03-26 DIAGNOSIS — R0981 Nasal congestion: Secondary | ICD-10-CM | POA: Diagnosis not present

## 2022-03-26 MED ORDER — TIZANIDINE HCL 4 MG PO TABS: 4.0000 mg | ORAL_TABLET | Freq: Four times a day (QID) | ORAL | 3 refills | Status: DC | PRN

## 2022-03-26 MED ORDER — HYDROXYZINE PAMOATE 50 MG PO CAPS: 50.0000 mg | ORAL_CAPSULE | Freq: Three times a day (TID) | ORAL | 1 refills | Status: DC | PRN

## 2022-03-26 MED ORDER — GABAPENTIN 800 MG PO TABS: 800.0000 mg | ORAL_TABLET | Freq: Three times a day (TID) | ORAL | 3 refills | Status: DC

## 2022-03-26 MED ORDER — NAPROXEN 500 MG PO TABS: 500.0000 mg | ORAL_TABLET | Freq: Two times a day (BID) | ORAL | 3 refills | Status: DC

## 2022-03-26 MED ORDER — SEMAGLUTIDE (1 MG/DOSE) 4 MG/3ML ~~LOC~~ SOPN: 1.0000 mg | PEN_INJECTOR | SUBCUTANEOUS | 3 refills | Status: DC

## 2022-03-26 MED ORDER — FLUTICASONE PROPIONATE 50 MCG/ACT NA SUSP: 2.0000 | Freq: Every day | NASAL | 6 refills | Status: DC

## 2022-03-26 MED ORDER — ALBUTEROL SULFATE 1.25 MG/3ML IN NEBU: 1.0000 | INHALATION_SOLUTION | Freq: Four times a day (QID) | RESPIRATORY_TRACT | 1 refills | Status: DC | PRN

## 2022-03-26 NOTE — Progress Notes (Signed)
SUBJECTIVE:   CHIEF COMPLAINT / HPI: Chronic back shoulder and knee pain  Chronic bilateral shoulder pain: Patient has a long history of bilateral shoulder pain.  It has been present for years years, insidious onset, she does not recall any traumatic incident, and has been steadily worsening.  She has been seen extensively by sports medicine and orthopedics for this issue.  She had MRI of her left shoulder in February 2023 that revealed chronic tears at supraspinatus and infraspinatus with retraction to the glenohumeral joint., also probable full-thickness tear of the superior subscapularis fibers, as well as torn distally retracted biceps tendon.  She reports and notes verify that she was determined not to be a surgical candidate with orthopedics per Dr. Saverio Danker.  She had CSI injections in the left glenohumeral joint in May and reports that she did not get any relief with that and does not wish to try CSI again.  This is significantly affecting her functionality as she reports she cannot even lift tea kettle.  She has trouble sleeping on both sides due to the pain.  She formerly worked in Teacher, adult education, Scientist, product/process development.  She is currently on disability and not working.  She has previously done physical therapy in years past but has not done it in several years.  Patient has tried a number of medications including gabapentin 600 mg, compounded Voltaren gel with lidocaine, oxycodone 5 acetaminophen 325, tramadol, meloxicam, methocarbamol.  She believes she has tried tizanidine in the past and that worked better for her and she would like to try this again.  Reviewed PDMP which showed that patient had 5 days of Norco 5-3 25 in January 2023.  Patient reports that she would like to trial Norco 10.  Back pain: Patient has had many years of chronic low back pain, she has a history of sciatica, reports that she has pain and tingling that radiates down the back of her left leg to below her knee.  Note that she reports no  history of trauma.  Her pain is bilateral lumbar spine.  She has a long history of urinary incontinence, denies saddle anesthesia.  DM2: Last A1c 6.2% 11/2021, current regimen on atorvastatin, will Invokana 100 mg, semaglutide 0.5 mg q. Weekly.  Patient very much likes semaglutide and would like to lose more weight to help with her chronic pain especially back pain.  COPD: Patient reports she does better with nebulizer but does not have a nebulizer machine requesting today.  Nebulizer machine since per patient request as well as albuterol.  PERTINENT  PMH / PSH: Type 2 diabetes, hyperlipidemia, class III obesity, rotator cuff tear, urinary incontinence, HFpEF  OBJECTIVE:   BP (!) 146/80   Pulse 100   Wt 227 lb (103 kg)   LMP 12/03/2016 (Approximate)   BMI 44.33 kg/m   Nursing note and vitals reviewed GEN: Age-appropriate, AA W, resting comfortably in chair, NAD, class III obesity HEENT: NCAT. PERRLA. Sclera without injection or icterus. MMM.  Shoulders: No evidence of bony deformity, asymmetry. + tenderness over long head of biceps (bicipital groove). No TTP at [facility] joint. Diminished active and passive range of motion 100* on R and 90* on Left with pain. Strength 4/5 throughout. Sensation intact. Peripheral pulses intact.  Positive empty can, positive Hawkins, negative Spurling test bilaterally. Lumbar spine: - Inspection: no gross deformity or asymmetry, swelling or ecchymosis. No skin changes - Palpation: + mild TTP over b/l paraspinal muscles, no TTP over the spinous processes, SI joints  b/l - ROM: full active ROM of the lumbar spine in flexion and extension with pain - Strength: 5/5 strength of lower extremity in L4-S1 nerve root distributions b/l - Neuro: sensation intact in the L4-S1 nerve root distribution b/l, 2+ L4 and S1 reflexes - Straight Leg Raise test: poisitive on Left Neuro: AOx3  Ext: no edema Psych: Pleasant and appropriate  ASSESSMENT/PLAN:   Type 2 diabetes mellitus  without complication, without long-term current use of insulin (HCC) Increase Ozempic to 1 mg q. weekly  Bilateral shoulder pain Chronic, nontraumatic.  Patient has had thorough work-up and extensive imaging of shoulders with both sports medicine and orthopedics in the last year.  She has not tried physical therapy in several years.  She has tried a number of conservative treatments for the last several years, none of which have worked unfortunately.  As she is not a current long-term chronic opiate user I do not feel that starting her on chronic high-dose opiates at this time is beneficial to her health the risks greatly outweigh the benefits.  Recommend trying multimodal approach to chronic pain, offered patient referral to PM&R, she would like to try the regimen below with physical therapy and then consider PM&R.  Recommend follow-up in 2 to 4 weeks either with sports medicine, orthopedics, or with PCP.  Patient does not have contraindication to using NSAIDs, kidney function is normal. -Increase gabapentin to 800 mg 3 times daily -Start naproxen 500 mg twice daily -Discontinue Flexeril -Start tizanidine 4 mg 3 times daily as needed -Referral to physical therapy   Chronic back pain Pain is chronic, mild.  Patient would like to trial tizanidine as Flexeril and tramadol does not work for her, see above problem.  Will slightly increase gabapentin to 800 mg 3 times daily and assess for improvement.  Patient has multiple complex etiologies of pain.  No red flag symptoms, recommend she trial physical therapy.  Can consider referral to pain management or PM&R in future.  COPD (chronic obstructive pulmonary disease) ([place]) Patient would like nebulizer, DME nebs sent     Gladys Damme, MD [place]

## 2022-03-26 NOTE — Patient Instructions (Addendum)
It was a pleasure to see you today!  For back and shoulder pain:  Increase gabapentin to 800 mg three times a day Start tizanidine three times a day STOP flexeril (do not take tizanidine and flexeril as they are both muscle relaxants) Take naproxen 500 mg twice a day I sent your albuterol to the pharmacy and sent a prescription for the nebulizer machine Start ozempic 1 mg weekly, I sent the prescription to your pharmacy I increased hydroxyzine to 50 mg  I have placed a referral for physical therapy. You should receive a phone call in 1-2 weeks to schedule this appointment. If for any reason you do not receive a phone call or need more help scheduling this appointment, please call our office at [phone]. Follow up with Dr. Jeani Hawking in 2-4 weeks    Be Well,  Dr. Chauncey Reading

## 2022-03-29 DIAGNOSIS — M25511 Pain in right shoulder: Secondary | ICD-10-CM | POA: Insufficient documentation

## 2022-03-29 NOTE — Assessment & Plan Note (Signed)
Patient would like nebulizer, DME nebs sent

## 2022-03-29 NOTE — Assessment & Plan Note (Signed)
Pain is chronic, mild.  Patient would like to trial tizanidine as Flexeril and tramadol does not work for her, see above problem.  Will slightly increase gabapentin to 800 mg 3 times daily and assess for improvement.  Patient has multiple complex etiologies of pain.  No red flag symptoms, recommend she trial physical therapy.  Can consider referral to pain management or PM&R in future.

## 2022-03-29 NOTE — Assessment & Plan Note (Signed)
Chronic, nontraumatic.  Patient has had thorough work-up and extensive imaging of shoulders with both sports medicine and orthopedics in the last year.  She has not tried physical therapy in several years.  She has tried a number of conservative treatments for the last several years, none of which have worked unfortunately.  As she is not a current long-term chronic opiate user I do not feel that starting her on chronic high-dose opiates at this time is beneficial to her health the risks greatly outweigh the benefits.  Recommend trying multimodal approach to chronic pain, offered patient referral to PM&R, she would like to try the regimen below with physical therapy and then consider PM&R.  Recommend follow-up in 2 to 4 weeks either with sports medicine, orthopedics, or with PCP.  Patient does not have contraindication to using NSAIDs, kidney function is normal. -Increase gabapentin to 800 mg 3 times daily -Start naproxen 500 mg twice daily -Discontinue Flexeril -Start tizanidine 4 mg 3 times daily as needed -Referral to physical therapy

## 2022-03-29 NOTE — Assessment & Plan Note (Signed)
Increase Ozempic to 1 mg q. weekly

## 2022-04-01 NOTE — Progress Notes (Deleted)
    SUBJECTIVE:   Chief compliant/HPI: annual examination  Rhyli Depaula Knodel is a 56 y.o. who presents today for an annual exam.    History tabs reviewed and updated ***.   Review of systems form reviewed and notable for ***.   OBJECTIVE:   LMP 12/03/2016 (Approximate)   ***  ASSESSMENT/PLAN:   No problem-specific Assessment & Plan notes found for this encounter.    Annual Examination  See AVS for age appropriate recommendations  PHQ score ***, reviewed and discussed.  BP reviewed and at goal ***.  Asked about intimate partner violence and resources given as appropriate  Advance directives discussion ***  Considered the following items based upon USPSTF recommendations: Diabetes screening: {discussed/ordered:14545} Screening for elevated cholesterol: {discussed/ordered:14545} HIV testing: {discussed/ordered:14545} Hepatitis C: {discussed/ordered:14545} Hepatitis B: {discussed/ordered:14545} Syphilis if at high risk: {discussed/ordered:14545} GC/CT {GC/CT screening :23818} Osteoporosis screening considered based upon risk of fracture from [facility] calculator. Major osteoporotic fracture risk is ***%. DEXA {ordered not order:23822}.  Reviewed risk factors for latent tuberculosis and {not indicated/requested/declined:14582}   Discussed family history, BRCA testing {not indicated/requested/declined:14582}. Tool used to risk stratify was ***.  Cervical cancer screening: {PAPTYPE:23819} Breast cancer screening: {mammoscreen:23820} Colorectal cancer screening: {crcscreen:23821::"discussed, colonoscopy ordered"} Lung cancer screening: {discussed/declined/written info:19698}. See documentation below regarding indications/risks/benefits.  Vaccinations ***.   Follow up in 1 *** year or sooner if indicated.    Eulis Foster, MD [place]

## 2022-04-02 ENCOUNTER — Encounter: Payer: Medicare Other | Admitting: Family Medicine

## 2022-04-04 ENCOUNTER — Emergency Department (HOSPITAL_COMMUNITY)
Admission: EM | Admit: 2022-04-04 | Discharge: 2022-04-04 | Disposition: A | Discharge status: Home / Self Care | Payer: Medicare Other | Attending: Emergency Medicine | Admitting: Emergency Medicine

## 2022-04-04 ENCOUNTER — Emergency Department (HOSPITAL_COMMUNITY)
Admission: EM | Admit: 2022-04-04 | Discharge: 2022-04-04 | Discharge status: Against Medical Advice | Payer: Medicare Other

## 2022-04-04 ENCOUNTER — Encounter (HOSPITAL_COMMUNITY): Payer: Self-pay

## 2022-04-04 ENCOUNTER — Emergency Department (EMERGENCY_DEPARTMENT_HOSPITAL)
Admission: EM | Admit: 2022-04-04 | Discharge: 2022-04-07 | Disposition: A | Discharge status: Home / Self Care | Payer: Medicare Other | Source: Home / Self Care | Attending: Emergency Medicine | Admitting: Emergency Medicine

## 2022-04-04 ENCOUNTER — Ambulatory Visit (HOSPITAL_COMMUNITY)
Admission: EM | Admit: 2022-04-04 | Discharge: 2022-04-04 | Discharge status: Against Medical Advice | Payer: Medicare Other

## 2022-04-04 ENCOUNTER — Other Ambulatory Visit: Payer: Self-pay

## 2022-04-04 DIAGNOSIS — R443 Hallucinations, unspecified: Secondary | ICD-10-CM

## 2022-04-04 DIAGNOSIS — S41101A Unspecified open wound of right upper arm, initial encounter: Secondary | ICD-10-CM | POA: Insufficient documentation

## 2022-04-04 DIAGNOSIS — R030 Elevated blood-pressure reading, without diagnosis of hypertension: Secondary | ICD-10-CM

## 2022-04-04 DIAGNOSIS — D72829 Elevated white blood cell count, unspecified: Secondary | ICD-10-CM | POA: Insufficient documentation

## 2022-04-04 DIAGNOSIS — S299XXA Unspecified injury of thorax, initial encounter: Secondary | ICD-10-CM | POA: Diagnosis present

## 2022-04-04 DIAGNOSIS — F419 Anxiety disorder, unspecified: Secondary | ICD-10-CM | POA: Diagnosis not present

## 2022-04-04 DIAGNOSIS — F22 Delusional disorders: Secondary | ICD-10-CM | POA: Insufficient documentation

## 2022-04-04 DIAGNOSIS — R739 Hyperglycemia, unspecified: Secondary | ICD-10-CM | POA: Insufficient documentation

## 2022-04-04 DIAGNOSIS — Z794 Long term (current) use of insulin: Secondary | ICD-10-CM | POA: Insufficient documentation

## 2022-04-04 DIAGNOSIS — S21109A Unspecified open wound of unspecified front wall of thorax without penetration into thoracic cavity, initial encounter: Secondary | ICD-10-CM | POA: Diagnosis not present

## 2022-04-04 DIAGNOSIS — Z79899 Other long term (current) drug therapy: Secondary | ICD-10-CM | POA: Insufficient documentation

## 2022-04-04 DIAGNOSIS — F3130 Bipolar disorder, current episode depressed, mild or moderate severity, unspecified: Secondary | ICD-10-CM | POA: Insufficient documentation

## 2022-04-04 DIAGNOSIS — Z20822 Contact with and (suspected) exposure to covid-19: Secondary | ICD-10-CM | POA: Insufficient documentation

## 2022-04-04 DIAGNOSIS — S41102A Unspecified open wound of left upper arm, initial encounter: Secondary | ICD-10-CM | POA: Diagnosis not present

## 2022-04-04 DIAGNOSIS — Z7982 Long term (current) use of aspirin: Secondary | ICD-10-CM | POA: Insufficient documentation

## 2022-04-04 DIAGNOSIS — X58XXXA Exposure to other specified factors, initial encounter: Secondary | ICD-10-CM | POA: Diagnosis not present

## 2022-04-04 DIAGNOSIS — R000 Tachycardia, unspecified: Secondary | ICD-10-CM | POA: Diagnosis not present

## 2022-04-04 LAB — CBC WITH DIFFERENTIAL/PLATELET
Abs Immature Granulocytes: 0.05 10*3/uL (ref 0.00–0.07)
Basophils Absolute: 0.1 10*3/uL (ref 0.0–0.1)
Basophils Relative: 0 %
Eosinophils Absolute: 0.3 10*3/uL (ref 0.0–0.5)
Eosinophils Relative: 2 %
HCT: 42.3 % (ref 36.0–46.0)
Hemoglobin: 14 g/dL (ref 12.0–15.0)
Immature Granulocytes: 0 %
Lymphocytes Relative: 24 %
Lymphs Abs: 3.9 10*3/uL (ref 0.7–4.0)
MCH: 31.1 pg (ref 26.0–34.0)
MCHC: 33.1 g/dL (ref 30.0–36.0)
MCV: 94 fL (ref 80.0–100.0)
Monocytes Absolute: 1.2 10*3/uL — ABNORMAL HIGH (ref 0.1–1.0)
Monocytes Relative: 7 %
Neutro Abs: 10.8 10*3/uL — ABNORMAL HIGH (ref 1.7–7.7)
Neutrophils Relative %: 67 %
Platelets: 353 10*3/uL (ref 150–400)
RBC: 4.5 MIL/uL (ref 3.87–5.11)
RDW: 14.2 % (ref 11.5–15.5)
WBC: 16.3 10*3/uL — ABNORMAL HIGH (ref 4.0–10.5)
nRBC: 0.2 % (ref 0.0–0.2)

## 2022-04-04 LAB — COMPREHENSIVE METABOLIC PANEL WITH GFR
ALT: 18 U/L (ref 0–44)
AST: 34 U/L (ref 15–41)
Albumin: 3.9 g/dL (ref 3.5–5.0)
Alkaline Phosphatase: 86 U/L (ref 38–126)
Anion gap: 11 (ref 5–15)
BUN: 19 mg/dL (ref 6–20)
CO2: 25 mmol/L (ref 22–32)
Calcium: 9.3 mg/dL (ref 8.9–10.3)
Chloride: 103 mmol/L (ref 98–111)
Creatinine, Ser: 0.94 mg/dL (ref 0.44–1.00)
GFR, Estimated: >60 mL/min
Glucose, Bld: 134 mg/dL — ABNORMAL HIGH (ref 70–99)
Potassium: 3.9 mmol/L (ref 3.5–5.1)
Sodium: 139 mmol/L (ref 135–145)
Total Bilirubin: 1.3 mg/dL — ABNORMAL HIGH (ref 0.3–1.2)
Total Protein: 8.4 g/dL — ABNORMAL HIGH (ref 6.5–8.1)

## 2022-04-04 LAB — I-STAT BETA HCG BLOOD, ED (MC, WL, AP ONLY): I-stat hCG, quantitative: <5 m[IU]/mL

## 2022-04-04 LAB — ETHANOL: Alcohol, Ethyl (B): <10 mg/dL

## 2022-04-04 NOTE — ED Triage Notes (Signed)
Ambulatory to ED with c/o hallucations. States she feels like there are bugs and other critters on and under her skin. Pt has significant amount of scars and scabs on BUE and chest from picking. Seen at [facility] earlier today and discharged. Hx of schizophrenia and bipolar.

## 2022-04-04 NOTE — ED Provider Triage Note (Signed)
Emergency Medicine Provider Triage Evaluation Note  Elizabeth Diaz , a 56 y.o. female  was evaluated in triage.  Pt complains of bugs crawling on her skin. She reports that she she can feel them crawling in her back and on her chest and abdomen. She has picked at her skin multiple times as well. H/o schizophrenia and bipolar per family. Has not been on medications in years. Reports cocaine use yesterday.   Review of Systems  Positive:  Negative:   Physical Exam  BP (!) 154/88 (BP Location: Right Arm)   Pulse (!) 108   Temp 99.4 F (37.4 C)   Resp 20   Ht 5' (1.524 m)   Wt 104.3 kg   LMP 12/03/2016 (Approximate)   SpO2 94%   BMI 44.92 kg/m  Gen:   Awake, no distress   Resp:  Normal effort  MSK:   Moves extremities without difficulty  Other:  Patient has multiple excoriated marks on her chest, abdomen, chest, and bilateral arms.   Medical Decision Making  Medically screening exam initiated at 10:23 PM.  Appropriate orders placed.  SHAYNAH HUND was informed that the remainder of the evaluation will be completed by another provider, this initial triage assessment does not replace that evaluation, and the importance of remaining in the ED until their evaluation is complete.  Patient is actively hallucinating. Family is at bedside and concerned. Will order medical clearance labs for TTS.    Sherrell Puller, PA-C 04/04/22 2230

## 2022-04-04 NOTE — ED Provider Notes (Signed)
[facility] EMERGENCY DEPARTMENT Provider Note   CSN: [id number] Arrival date & time: 04/04/22  1149     History  No chief complaint on file.   Elizabeth Diaz is a 56 y.o. female who presents emergency department complaining that she has bugs under her skin's.  Patient is extremely angry.  She states that she has bugs under her skin and we need to see what she is seeing.  She is currently picking at hold on her skin where she has dug into it.  She has no other complaints.  She does admit to a psychiatric history.  HPI     Home Medications Prior to Admission medications   Medication Sig Start Date End Date Taking? Authorizing Provider  albuterol (ACCUNEB) 1.25 MG/3ML nebulizer solution Take 3 mLs (1.25 mg total) by nebulization every 6 (six) hours as needed. 03/26/22   Gladys Damme, MD  aspirin 81 MG chewable tablet Chew 81 mg by mouth daily.    [provider]  atorvastatin (LIPITOR) 80 MG tablet Take 1 tablet (80 mg total) by mouth daily. 03/05/22   Autry-Lott, Naaman Plummer, DO  blood glucose meter kit and supplies KIT Dispense based on patient and insurance preference. Use once daily as directed. Dx code E11.9. 11/30/18   Zenia Resides, MD  Blood Glucose Monitoring Suppl ([facility]) w/Device KIT Use glucometer to test sugar. DX: E11.9 02/01/20   Shirley, [place], DO  budesonide-formoterol [facility]) 160-4.5 MCG/ACT inhaler Inhale 2 puffs into the lungs 2 (two) times daily. 12/22/21   Ezequiel Essex, MD  canagliflozin [facility]) 100 MG TABS tablet Take 1 tablet (100 mg total) by mouth daily before breakfast. 03/05/22   Autry-Lott, Naaman Plummer, DO  cyclobenzaprine (FLEXERIL) 10 MG tablet TAKE 1 TO 2 TABLETS BY MOUTH TWICE DAILY AS NEEDED FOR MUSCLE SPASM 03/05/22   Autry-Lott, Naaman Plummer, DO  diclofenac Sodium (VOLTAREN) 1 % GEL Apply topically. 01/24/18   [provider]  ezetimibe (ZETIA) 10 MG tablet Take 1 tablet (10 mg total) by mouth daily.  10/28/21   Ezequiel Essex, MD  fluticasone [facility]) 50 MCG/ACT nasal spray Place 2 sprays into both nostrils daily. 03/26/22   Gladys Damme, MD  furosemide (LASIX) 40 MG tablet TAKE 1/2 (ONE-HALF) TABLET BY MOUTH ONCE DAILY AS NEEDED 11/26/21   Ezequiel Essex, MD  gabapentin (NEURONTIN) 800 MG tablet Take 1 tablet (800 mg total) by mouth 3 (three) times daily. 03/26/22   Gladys Damme, MD  glucose blood [facility] VERIO) test strip Use as instructed 01/29/20   Shirley, [place], DO  HYDROcodone-acetaminophen (NORCO/VICODIN) 5-325 MG tablet TAKE 1 TABLET BY MOUTH EVERY 8 HOURS AS NEEDED FOR SEVERE PAIN 10/20/21   [provider]  hydrOXYzine (VISTARIL) 50 MG capsule Take 1 capsule (50 mg total) by mouth every 8 (eight) hours as needed. 03/26/22   Gladys Damme, MD  Lancets Misc. (ACCU-CHEK SOFTCLIX LANCET DEV) KIT Use as instructed to test sugars once daily.  Dx code:E11.9 11/30/18   Zenia Resides, MD  megestrol (MEGACE) 40 MG tablet Take 1 tablet (40 mg total) by mouth daily. 11/08/21   Pattricia Boss, MD  naproxen (NAPROSYN) 500 MG tablet Take 1 tablet (500 mg total) by mouth 2 (two) times daily with a meal. 03/26/22   Gladys Damme, MD  omeprazole (PRILOSEC) 20 MG capsule Take 1 capsule (20 mg total) by mouth daily. 12/22/21   Ezequiel Essex, MD  ondansetron (ZOFRAN) 8 MG tablet Take 1 tablet (8 mg total) by mouth  every 8 (eight) hours as needed for nausea or vomiting. 03/05/22   Autry-Lott, Naaman Plummer, DO  OneTouch Delica Lancets 20F MISC 1 Units by Does not apply route daily. 01/29/20   Shirley, [place], DO  Semaglutide, 1 MG/DOSE, 4 MG/3ML SOPN Inject 1 mg into the skin once a week. 03/26/22   Gladys Damme, MD  tiZANidine (ZANAFLEX) 4 MG tablet Take 1 tablet (4 mg total) by mouth every 6 (six) hours as needed for muscle spasms. 03/26/22   Gladys Damme, MD  tolterodine (DETROL LA) 4 MG 24 hr capsule Take by mouth. 11/08/18   [provider]  topiramate (TOPAMAX) 25 MG tablet Take  by mouth. 08/31/18   [provider]      Allergies    Patient has no known allergies.    Review of Systems   Review of Systems  Physical Exam Updated Vital Signs BP (!) 134/123 (BP Location: Left Arm)   Pulse (!) 122   Temp 98.1 F (36.7 C) (Oral)   Resp (!) 24   LMP 12/03/2016 (Approximate)   SpO2 95%  Physical Exam Vitals and nursing note reviewed.  Constitutional:      General: She is not in acute distress.    Appearance: She is well-developed. She is not diaphoretic.  HENT:     Head: Normocephalic and atraumatic.     Right Ear: External ear normal.     Left Ear: External ear normal.     Nose: Nose normal.     Mouth/Throat:     Mouth: Mucous membranes are moist.  Eyes:     General: No scleral icterus.    Conjunctiva/sclera: Conjunctivae normal.  Cardiovascular:     Rate and Rhythm: Normal rate and regular rhythm.     Heart sounds: Normal heart sounds. No murmur heard.    No friction rub. No gallop.  Pulmonary:     Effort: Pulmonary effort is normal. No respiratory distress.     Breath sounds: Normal breath sounds.  Abdominal:     General: Bowel sounds are normal. There is no distension.     Palpations: Abdomen is soft. There is no mass.     Tenderness: There is no abdominal tenderness. There is no guarding.  Musculoskeletal:     Cervical back: Normal range of motion.  Skin:    General: Skin is warm and dry.     Comments: Multiple wounds on her arms and chest where she is picking at the skin, no obvious parasites or bugs on the patient's body  Neurological:     Mental Status: She is alert and oriented to person, place, and time.  Psychiatric:        Behavior: Behavior normal.     ED Results / Procedures / Treatments   Labs (all labs ordered are listed, but only abnormal results are displayed) Labs Reviewed - No data to display  EKG None  Radiology No results found.  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  I have advised her that she does not have a medical emergency.  That she states that she needs to follow-up with a dermatologist for biopsy if she is concerned that she has parasites.  Otherwise she  should stop taking at her skin.  She appears otherwise appropriate for discharge at this time without obvious emergent issue.           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

## 2022-04-04 NOTE — ED Triage Notes (Signed)
Patient complains of bugs crawling under her skin. Patient has picked sores on bilateral arms. Patient denies [place] issues. Patient was reported using drugs prior to check-in

## 2022-04-04 NOTE — ED Notes (Signed)
Pt cussing staff and advised she wanted to go to [facility]. This wouldn't let this tech obtain vitals.

## 2022-04-04 NOTE — Discharge Instructions (Signed)
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  This can be exacerbated by using drugs.  If you are using these I would advise that you do not.  I am going to give you referral to a dermatologist who can do a biopsy.  You do not appear to have a life-threatening illness.  Please follow-up with your primary care doctor or a dermatologist for further evaluation.

## 2022-04-05 DIAGNOSIS — R030 Elevated blood-pressure reading, without diagnosis of hypertension: Secondary | ICD-10-CM | POA: Diagnosis not present

## 2022-04-05 DIAGNOSIS — F22 Delusional disorders: Secondary | ICD-10-CM | POA: Diagnosis present

## 2022-04-05 LAB — RAPID URINE DRUG SCREEN, HOSP PERFORMED
Amphetamines: NOT DETECTED
Barbiturates: NOT DETECTED
Benzodiazepines: NOT DETECTED
Cocaine: POSITIVE — AB
Opiates: NOT DETECTED
Tetrahydrocannabinol: POSITIVE — AB

## 2022-04-05 LAB — URINALYSIS, ROUTINE W REFLEX MICROSCOPIC
Bilirubin Urine: NEGATIVE
Glucose, UA: NEGATIVE mg/dL
Ketones, ur: NEGATIVE mg/dL
Nitrite: NEGATIVE
Protein, ur: 30 mg/dL — AB
Specific Gravity, Urine: 1.031 — ABNORMAL HIGH (ref 1.005–1.030)
pH: 5 (ref 5.0–8.0)

## 2022-04-05 LAB — RESP PANEL BY RT-PCR (FLU A&B, COVID) ARPGX2
Influenza A by PCR: NEGATIVE
Influenza B by PCR: NEGATIVE
SARS Coronavirus 2 by RT PCR: NEGATIVE

## 2022-04-05 MED ORDER — TRAZODONE HCL 50 MG PO TABS
50.0000 mg | ORAL_TABLET | Freq: Every day | ORAL | Status: DC
Start: 2022-04-05 — End: 2022-04-07
  Administered 2022-04-05 – 2022-04-06 (×2): 50 mg via ORAL
  Filled 2022-04-05 (×2): qty 1

## 2022-04-05 MED ORDER — HYDROXYZINE HCL 25 MG PO TABS
50.0000 mg | ORAL_TABLET | Freq: Once | ORAL | Status: AC
Start: 2022-04-05 — End: 2022-04-05
  Administered 2022-04-05: 50 mg via ORAL
  Filled 2022-04-05: qty 2

## 2022-04-05 MED ORDER — HYDROXYZINE HCL 25 MG PO TABS
25.0000 mg | ORAL_TABLET | Freq: Three times a day (TID) | ORAL | Status: DC | PRN
  Administered 2022-04-06 (×2): 25 mg via ORAL
  Filled 2022-04-05 (×2): qty 1

## 2022-04-05 MED ORDER — ARIPIPRAZOLE 5 MG PO TABS
5.0000 mg | ORAL_TABLET | Freq: Every day | ORAL | Status: DC
  Administered 2022-04-05 – 2022-04-07 (×3): 5 mg via ORAL
  Filled 2022-04-05 (×3): qty 1

## 2022-04-05 MED ORDER — DULOXETINE HCL 30 MG PO CPEP
30.0000 mg | ORAL_CAPSULE | Freq: Every day | ORAL | Status: DC
Start: 2022-04-05 — End: 2022-04-07
  Administered 2022-04-05 – 2022-04-07 (×3): 30 mg via ORAL
  Filled 2022-04-05 (×3): qty 1

## 2022-04-05 MED ORDER — ACETAMINOPHEN 325 MG PO TABS
650.0000 mg | ORAL_TABLET | Freq: Once | ORAL | Status: AC
  Administered 2022-04-05: 650 mg via ORAL
  Filled 2022-04-05: qty 2

## 2022-04-05 NOTE — Progress Notes (Signed)
Per Michelle Piper, NP, patient meets criteria for inpatient treatment. There are no available beds at [facility] today. CSW faxed referrals to the following facilities for review:  [facility]  Pending - No Request Sent N/A [address] [place] [address] [phone] --  [facility]  Pending - No Request Sent N/A [address] [place] [address] [phone] --  [facility]  Pending - No Request [facility] [address] [place] [address] [phone] --  [facility] [address] [place] [address] [phone] --  [facility]  Pending - No Request Sent N/A [address] [place] [address] [phone] --  [facility]  Pending - No Request Sent N/A [address] [place] [address] [place] --  [facility]  Pending - No Request Sent N/A [address], [place] [address] [facility]  Pending - No Request Sent N/A [address] [place] [address] [phone] --  [facility]  Pending - No Request Sent N/A [address] [place] [address] [phone] --  [facility]  Pending - No Request Sent N/A [address] [place] [address] [phone] --  [place]  Pending - No Request Sent N/A [address] [place] [address] [phone] --  [facility]  Pending - No Request Sent N/A [address] [place] [address] [phone] --  [facility]  Pending - No Request Sent N/A [address] [place] [address] [phone] --  [facility]  Pending  - No Request Sent N/A [address], [place] [address] [phone] --  [facility]  Pending - No Request Sent N/A [address] [place] [address] [phone] --   TTS will continue to seek bed placement.  Glennie [place], MSW, [place] Phone: [phone] Disposition/TOC

## 2022-04-05 NOTE — ED Provider Notes (Signed)
[facility] DEPT Provider Note   CSN: [id number] Arrival date & time: 04/04/22  2102     History  Chief Complaint  Patient presents with   Hallucinations    Elizabeth Diaz is a 56 y.o. female who presents to the emergency department complaining of hallucinations.  Patient notes that there are bugs crawling on her skin.  Patient notes she is having visual hallucinations.  No meds tried prior to arrival.  Patient notes that she wants the areas cut open to see what is in them.  Denies SI, HI, auditory hallucinations.  Per patient chart review: Patient was evaluated by a psych specialist in 2019 and at that time it was noted that patient was taking the following medications cymbalta, vistaril, ability, gabapentin, oxybutynin, simvastatin, Lasix, baby aspirin, Wellbutrin, amitriptyline.   The history is provided by the patient. No language interpreter was used.       Home Medications Prior to Admission medications   Medication Sig Start Date End Date Taking? Authorizing Provider  albuterol (ACCUNEB) 1.25 MG/3ML nebulizer solution Take 3 mLs (1.25 mg total) by nebulization every 6 (six) hours as needed. 03/26/22   Gladys Damme, MD  aspirin 81 MG chewable tablet Chew 81 mg by mouth daily.    [provider]  atorvastatin (LIPITOR) 80 MG tablet Take 1 tablet (80 mg total) by mouth daily. 03/05/22   Autry-Lott, Naaman Plummer, DO  blood glucose meter kit and supplies KIT Dispense based on patient and insurance preference. Use once daily as directed. Dx code E11.9. 11/30/18   Zenia Resides, MD  Blood Glucose Monitoring Suppl ([place]) w/Device KIT Use glucometer to test sugar. DX: E11.9 02/01/20   Shirley, [place], DO  budesonide-formoterol [facility]) 160-4.5 MCG/ACT inhaler Inhale 2 puffs into the lungs 2 (two) times daily. 12/22/21   Ezequiel Essex, MD  canagliflozin [facility]) 100 MG TABS tablet Take 1 tablet (100 mg total) by mouth daily  before breakfast. 03/05/22   Autry-Lott, Naaman Plummer, DO  cyclobenzaprine (FLEXERIL) 10 MG tablet TAKE 1 TO 2 TABLETS BY MOUTH TWICE DAILY AS NEEDED FOR MUSCLE SPASM 03/05/22   Autry-Lott, Naaman Plummer, DO  diclofenac Sodium (VOLTAREN) 1 % GEL Apply topically. 01/24/18   [provider]  ezetimibe (ZETIA) 10 MG tablet Take 1 tablet (10 mg total) by mouth daily. 10/28/21   Ezequiel Essex, MD  fluticasone [facility]) 50 MCG/ACT nasal spray Place 2 sprays into both nostrils daily. 03/26/22   Gladys Damme, MD  furosemide (LASIX) 40 MG tablet TAKE 1/2 (ONE-HALF) TABLET BY MOUTH ONCE DAILY AS NEEDED 11/26/21   Ezequiel Essex, MD  gabapentin (NEURONTIN) 800 MG tablet Take 1 tablet (800 mg total) by mouth 3 (three) times daily. 03/26/22   Gladys Damme, MD  glucose blood [facility] VERIO) test strip Use as instructed 01/29/20   Shirley, [place], DO  HYDROcodone-acetaminophen (NORCO/VICODIN) 5-325 MG tablet TAKE 1 TABLET BY MOUTH EVERY 8 HOURS AS NEEDED FOR SEVERE PAIN 10/20/21   [provider]  hydrOXYzine (VISTARIL) 50 MG capsule Take 1 capsule (50 mg total) by mouth every 8 (eight) hours as needed. 03/26/22   Gladys Damme, MD  Lancets Misc. (ACCU-CHEK SOFTCLIX LANCET DEV) KIT Use as instructed to test sugars once daily.  Dx code:E11.9 11/30/18   Zenia Resides, MD  megestrol (MEGACE) 40 MG tablet Take 1 tablet (40 mg total) by mouth daily. 11/08/21   Pattricia Boss, MD  naproxen (NAPROSYN) 500 MG tablet Take 1 tablet (500 mg total) by mouth  2 (two) times daily with a meal. 03/26/22   Gladys Damme, MD  omeprazole (PRILOSEC) 20 MG capsule Take 1 capsule (20 mg total) by mouth daily. 12/22/21   Ezequiel Essex, MD  ondansetron (ZOFRAN) 8 MG tablet Take 1 tablet (8 mg total) by mouth every 8 (eight) hours as needed for nausea or vomiting. 03/05/22   Autry-Lott, Naaman Plummer, DO  OneTouch Delica Lancets 66A MISC 1 Units by Does not apply route daily. 01/29/20   Shirley, [place], DO  Semaglutide, 1 MG/DOSE, 4 MG/3ML  SOPN Inject 1 mg into the skin once a week. 03/26/22   Gladys Damme, MD  tiZANidine (ZANAFLEX) 4 MG tablet Take 1 tablet (4 mg total) by mouth every 6 (six) hours as needed for muscle spasms. 03/26/22   Gladys Damme, MD  tolterodine (DETROL LA) 4 MG 24 hr capsule Take by mouth. 11/08/18   [provider]  topiramate (TOPAMAX) 25 MG tablet Take by mouth. 08/31/18   [provider]      Allergies    Patient has no known allergies.    Review of Systems   Review of Systems  Constitutional:  Negative for fever.  Skin:  Positive for wound. Negative for color change.  Psychiatric/Behavioral:  Positive for hallucinations. Negative for self-injury and suicidal ideas.   All other systems reviewed and are negative.   Physical Exam Updated Vital Signs BP (!) 154/88 (BP Location: Right Arm)   Pulse (!) 108   Temp 99.4 F (37.4 C)   Resp 20   Ht 5' (1.524 m)   Wt 104.3 kg   LMP 12/03/2016 (Approximate)   SpO2 94%   BMI 44.92 kg/m  Physical Exam Vitals and nursing note reviewed.  Constitutional:      General: She is not in acute distress.    Appearance: She is not diaphoretic.  HENT:     Head: Normocephalic and atraumatic.     Mouth/Throat:     Pharynx: No oropharyngeal exudate.  Eyes:     General: No scleral icterus.    Conjunctiva/sclera: Conjunctivae normal.  Cardiovascular:     Rate and Rhythm: Normal rate and regular rhythm.     Pulses: Normal pulses.     Heart sounds: Normal heart sounds.  Pulmonary:     Effort: Pulmonary effort is normal. No respiratory distress.     Breath sounds: Normal breath sounds. No wheezing.  Abdominal:     General: Bowel sounds are normal.     Palpations: Abdomen is soft. There is no mass.     Tenderness: There is no abdominal tenderness. There is no guarding or rebound.  Musculoskeletal:        General: Normal range of motion.     Cervical back: Normal range of motion and neck supple.  Skin:    General: Skin is warm and  dry.     Comments: Multiple excoriation marks noted to bilateral forearms and upper abdominal wall.  No signs of fluctuance or induration noted to the areas.  No signs of dehiscence noted.  Neurological:     Mental Status: She is alert.  Psychiatric:        Behavior: Behavior normal.     ED Results / Procedures / Treatments   Labs (all labs ordered are listed, but only abnormal results are displayed) Labs Reviewed  COMPREHENSIVE METABOLIC PANEL - Abnormal; Notable for the following components:      Result Value   Glucose, Bld 134 (*)    Total Protein 8.4 (*)  Total Bilirubin 1.3 (*)    All other components within normal limits  CBC WITH DIFFERENTIAL/PLATELET - Abnormal; Notable for the following components:   WBC 16.3 (*)    Neutro Abs 10.8 (*)    Monocytes Absolute 1.2 (*)    All other components within normal limits  RESP PANEL BY RT-PCR (FLU A&B, COVID) ARPGX2  ETHANOL  RAPID URINE DRUG SCREEN, HOSP PERFORMED  URINALYSIS, ROUTINE W REFLEX MICROSCOPIC  I-STAT BETA HCG BLOOD, ED ([facility], [facility], [facility] ONLY)    EKG None  Radiology No results found.  Procedures Procedures    Medications Ordered in ED Medications  hydrOXYzine (ATARAX) tablet 50 mg (50 mg Oral Given 04/05/22 0245)  acetaminophen (TYLENOL) tablet 650 mg (650 mg Oral Given 04/05/22 0245)    ED Course/ Medical Decision Making/ A&P Clinical Course as of 04/05/22 0615  Sun Apr 05, 2022  0224 Discussed with patient treatment plan. Pt agreeable at this time.  [SB]  6153 Discussed with patient that we will proceed with TTS consult to get patient started back on her medications.  Patient agreeable at this time.  Patient voluntary at this time. [SB]    Clinical Course User Index [SB] Valda Christenson A, PA-C                           Medical Decision Making Risk OTC drugs. Prescription drug management.   Pt presents with concerns for visual hallucinations in the form of seeing bugs crawling on her skin.  Denies SI,  HI, auditory hallucinations today.  Patient afebrile.  On exam patient with Multiple excoriation marks noted to bilateral forearms and upper abdominal wall.  No signs of fluctuance or induration noted to the areas.  No signs of dehiscence noted. No acute cardiovascular, respiratory exam findings.     Additional history obtained:  External records from outside source obtained and reviewed including: Patient was evaluated by a psych specialist in 2019 and at that time it was noted that patient was taking the following medications cymbalta, vistaril, ability, gabapentin, oxybutynin, simvastatin, Lasix, baby aspirin, Wellbutrin, amitriptyline.   Labs:  I ordered, and personally interpreted labs.  The pertinent results include:   COVID flu swab negative. I-STAT beta-hCG unremarkable. Ethanol undetectable. CBC elevated WBC at 16.3 otherwise unremarkable. CMP with slightly elevated glucose at 134 and slightly elevated total bilirubin 1.3 otherwise unremarkable  Medications:  I ordered medication including tylenol, atarax for symptom management Reevaluation of the patient after these medicines and interventions, I reevaluated the patient and found that they have improved I have reviewed the patients home medicines and have made adjustments as needed   Disposition: Presentation suspicious for visual hallucinations. Doubt HI or SI at this time. Medically cleared at this time, patient voluntary at this time, dispo as per TTS.    This chart was dictated using voice recognition software, Dragon. Despite the best efforts of this provider to proofread and correct errors, errors may still occur which can change documentation meaning.  Final Clinical Impression(s) / ED Diagnoses Final diagnoses:  Hallucinations    Rx / DC Orders ED Discharge Orders     None         Tulani Kidney A, PA-C 04/05/22 7943    Merrily Pew, MD 04/05/22 [phone]

## 2022-04-05 NOTE — Consult Note (Signed)
[facility] ED ASSESSMENT   Reason for Consult:  Psychiatry evaluation Referring Physician:  ER Physician Patient Identification: Elizabeth Diaz MRN:  [id number] ED Chief Complaint: Delusional disorder currently symptomatic [facility])  Diagnosis:  Principal Problem:   Delusional disorder currently symptomatic ([place]) Active Problems:   Bipolar affective disorder, depressed ([place])   ED Assessment Time Calculation: Start Time: 1200 Stop Time: 1230 Total Time in Minutes (Assessment Completion): 30   Subjective:   Elizabeth Diaz is a 56 y.o. female patient admitted with hx Bipolar disorder and depression came to the ER with complaint of seeing and feeling bugs crawling all over her body.  Patient is continuously scratching and noted numerous superficial cuts  and picked sores made with her fingers all over her arm and abdomen .  HPI:  Patient was seen this morning awake and alert and oriented x5.  Patient reported itching and seeing and feeling bugs crawl all over her.  She also endorsed feeling depressed and rated depression 7/10 with 10 being severe depression.  Patient have not been on Medications for up to three years or more.  She has no outpatient Psychiatrist but her PCP does both primary and Psychiatry care.  She reported her stressors involves her children, GF and other family members causing her to feel depressed.  Patient reported poor sleep but good appetite.  She is unemployed and receives [employer].  Patient reported seeing bugs moving inside her body and hearing voices telling her different things.  Patient denied SI/HI/VH.  She meets criteria for inpatient Psychiatry hospitalization to resume medications.  We will fax out records to hospital around for available bed.  We will start medications while waiting for bed.  Past Psychiatric History: hx Bipolar disorder and depression.  No inpatient Psychiatry hospitalization hx noted.  Pat Medications includes Cymbalta, Vistaril, Ability, Gabapentin,  Wellbutrin, Amitriptyline.   Risk to Self or Others: Is the patient at risk to self? No Has the patient been a risk to self in the past 6 months? No Has the patient been a risk to self within the distant past? No Is the patient a risk to others? No Has the patient been a risk to others in the past 6 months? No Has the patient been a risk to others within the distant past? No  [place] Scale:  [place] ED from 04/04/2022 in [place] DEPT Most recent reading at 04/04/2022 10:25 PM ED from 04/04/2022 in [facility] Most recent reading at 04/04/2022 11:56 AM ED from 11/08/2021 in [facility] Most recent reading at 11/08/2021  2:00 PM  C-SSRS RISK CATEGORY No Risk No Risk No Risk       AIMS:  , , ,  ,   ASAM:    Substance Abuse:     Past Medical History:  Past Medical History:  Diagnosis Date   Arthritis    Atypical chest pain 12/12/2015   Benign neoplasm of rectum    Chronic pain    Congestive heart failure (CHF) (HCC)    COPD (chronic obstructive pulmonary disease) ([place])    Diabetes mellitus ([place])    Diverticulosis    Foot pain, left 07/01/2021   GERD (gastroesophageal reflux disease) 12/29/2016   Head pain 07/01/2021   HTN (hypertension) 12/29/2016   Hyperplastic colon polyp    Lower extremity edema 12/12/2015   Mood altered 10/05/2015   Numbness    left leg   Obesity  Pneumothorax    Routine screening for STI (sexually transmitted infection) 08/07/2020   Schatzki's ring    Screen for sexually transmitted diseases 08/07/2020   Seizures ([place])    hx of, no seizures last few years   Type 2 diabetes mellitus ([place]) 01/27/2018   UTI (urinary tract infection) 01/29/2020    Past Surgical History:  Procedure Laterality Date   CHEST TUBE INSERTION     COLONOSCOPY WITH PROPOFOL N/A 11/01/2014   Procedure: COLONOSCOPY WITH PROPOFOL;  Surgeon: Jerene Bears, MD;  Location: [facility]  ENDOSCOPY;  Service: Gastroenterology;  Laterality: N/A;   ESOPHAGOGASTRODUODENOSCOPY (EGD) WITH PROPOFOL N/A 07/29/2015   Procedure: ESOPHAGOGASTRODUODENOSCOPY (EGD) WITH PROPOFOL;  Surgeon: Jerene Bears, MD;  Location: [facility] ENDOSCOPY;  Service: Gastroenterology;  Laterality: N/A;   PERIPHERAL VASCULAR CATHETERIZATION N/A 01/07/2016   Procedure: Lower Extremity Angiography;  Surgeon: Adrian Prows, MD;  Location: [place] CV LAB;  Service: Cardiovascular;  Laterality: N/A;   PERIPHERAL VASCULAR CATHETERIZATION Right 02/25/2016   Procedure: Peripheral Vascular Atherectomy;  Surgeon: Adrian Prows, MD;  Location: [place] CV LAB;  Service: Cardiovascular;  Laterality: Right;  SFA ATHERECTOMY/DRUG COATED PTA   TUBAL LIGATION     Family History:  Family History  Problem Relation Age of Onset   Heart failure Father    Diabetes Father    Diabetes Maternal Grandmother    Diabetes Paternal Grandfather    Parkinson's disease Paternal Grandfather    Cancer Maternal Grandfather        unknown type   Family Psychiatric  History: Denied Social History:  Social History   Substance and Sexual Activity  Alcohol Use Yes   Comment: occasionally     Social History   Substance and Sexual Activity  Drug Use Not Currently   Types: Cocaine, Marijuana   Comment: 20 year history of crack cocaine. Haven't used any in two years.     Social History   Socioeconomic History   Marital status: Single    Spouse name: Not on file   Number of children: 2   Years of education: Not on file   Highest education level: Not on file  Occupational History   Occupation: Prep Cook  Tobacco Use   Smoking status: Some Days    Years: 0.00    Types: Cigarettes    Last attempt to quit: 05/04/2009    Years since quitting: 12.9    Passive exposure: Current   Smokeless tobacco: Never   Tobacco comments:    Smokes about 2 cigs / week  Vaping Use   Vaping Use: Never used  Substance and Sexual Activity   Alcohol use: Yes     Comment: occasionally   Drug use: Not Currently    Types: Cocaine, Marijuana    Comment: 20 year history of crack cocaine. Haven't used any in two years.    Sexual activity: Not on file  Other Topics Concern   Not on file  Social History Narrative   Lives in [place]    Works in [employer] at [employer] group)   Hobbies: listening to music. Bowling.    Social Determinants of Health   Financial Resource Strain: Not on file  Food Insecurity: Not on file  Transportation Needs: Not on file  Physical Activity: Not on file  Stress: Not on file  Social Connections: Not on file   Additional Social History:    Allergies:  No Known Allergies  Labs:  Results for orders placed or performed during the hospital  encounter of 04/04/22 (from the past 48 hour(s))  Comprehensive metabolic panel     Status: Abnormal   Collection Time: 04/04/22 10:38 PM  Result Value Ref Range   Sodium 139 135 - 145 mmol/L   Potassium 3.9 3.5 - 5.1 mmol/L   Chloride 103 98 - 111 mmol/L   CO2 25 22 - 32 mmol/L   Glucose, Bld 134 (H) 70 - 99 mg/dL    Comment: Glucose reference range applies only to samples taken after fasting for at least 8 hours.   BUN 19 6 - 20 mg/dL   Creatinine, Ser 0.94 0.44 - 1.00 mg/dL   Calcium 9.3 8.9 - 10.3 mg/dL   Total Protein 8.4 (H) 6.5 - 8.1 g/dL   Albumin 3.9 3.5 - 5.0 g/dL   AST 34 15 - 41 U/L   ALT 18 0 - 44 U/L   Alkaline Phosphatase 86 38 - 126 U/L   Total Bilirubin 1.3 (H) 0.3 - 1.2 mg/dL   GFR, Estimated >60 >60 mL/min    Comment: (NOTE) Calculated using the CKD-EPI Creatinine Equation (2021)    Anion gap 11 5 - 15    Comment: Performed at [facility], [place] [address] [place], [place] [address]  Ethanol     Status: None   Collection Time: 04/04/22 10:38 PM  Result Value Ref Range   Alcohol, Ethyl (B) <10 <10 mg/dL    Comment: (NOTE) Lowest detectable limit for serum alcohol is 10 mg/dL.  For medical purposes only. Performed  at [facility], [place] [address], [place], [place] [address]   CBC with Diff     Status: Abnormal   Collection Time: 04/04/22 10:38 PM  Result Value Ref Range   WBC 16.3 (H) 4.0 - 10.5 K/uL   RBC 4.50 3.87 - 5.11 MIL/uL   Hemoglobin 14.0 12.0 - 15.0 g/dL   HCT 42.3 36.0 - 46.0 %   MCV 94.0 80.0 - 100.0 fL   MCH 31.1 26.0 - 34.0 pg   MCHC 33.1 30.0 - 36.0 g/dL   RDW 14.2 11.5 - 15.5 %   Platelets 353 150 - 400 K/uL   nRBC 0.2 0.0 - 0.2 %   Neutrophils Relative % 67 %   Neutro Abs 10.8 (H) 1.7 - 7.7 K/uL   Lymphocytes Relative 24 %   Lymphs Abs 3.9 0.7 - 4.0 K/uL   Monocytes Relative 7 %   Monocytes Absolute 1.2 (H) 0.1 - 1.0 K/uL   Eosinophils Relative 2 %   Eosinophils Absolute 0.3 0.0 - 0.5 K/uL   Basophils Relative 0 %   Basophils Absolute 0.1 0.0 - 0.1 K/uL   Immature Granulocytes 0 %   Abs Immature Granulocytes 0.05 0.00 - 0.07 K/uL    Comment: Performed at [facility], [place] [address] [place], [place] [address]  I-Stat beta hCG blood, ED     Status: None   Collection Time: 04/04/22 10:45 PM  Result Value Ref Range   I-stat hCG, quantitative <5.0 <5 mIU/mL   Comment 3            Comment:   GEST. AGE      CONC.  (mIU/mL)   <=1 WEEK        5 - 50     2 WEEKS       50 - 500     3 WEEKS       100 - 10,000     4 WEEKS  1,000 - 30,000        FEMALE AND NON-PREGNANT FEMALE:     LESS THAN 5 mIU/mL   Resp Panel by RT-PCR (Flu A&B, Covid) Anterior Nasal Swab     Status: None   Collection Time: 04/05/22  3:07 AM   Specimen: Anterior Nasal Swab  Result Value Ref Range   SARS Coronavirus 2 by RT PCR NEGATIVE NEGATIVE    Comment: (NOTE) SARS-CoV-2 target nucleic acids are NOT DETECTED.  The SARS-CoV-2 RNA is generally detectable in upper respiratory specimens during the acute phase of infection. The lowest concentration of SARS-CoV-2 viral copies this assay can detect is 138 copies/mL. A negative result does not preclude  SARS-Cov-2 infection and should not be used as the sole basis for treatment or other patient management decisions. A negative result may occur with  improper specimen collection/handling, submission of specimen other than nasopharyngeal swab, presence of viral mutation(s) within the areas targeted by this assay, and inadequate number of viral copies(<138 copies/mL). A negative result must be combined with clinical observations, patient history, and epidemiological information. The expected result is Negative.  Fact Sheet for Patients:  EntrepreneurPulse.com.au  Fact Sheet for Healthcare Providers:  IncredibleEmployment.be  This test is no t yet approved or cleared by the [place] FDA and  has been authorized for detection and/or diagnosis of SARS-CoV-2 by FDA under an Emergency Use Authorization (EUA). This EUA will remain  in effect (meaning this test can be used) for the duration of the COVID-19 declaration under Section 564(b)(1) of the Act, 21 U.S.C.section 360bbb-3(b)(1), unless the authorization is terminated  or revoked sooner.       Influenza A by PCR NEGATIVE NEGATIVE   Influenza B by PCR NEGATIVE NEGATIVE    Comment: (NOTE) The Xpert Xpress SARS-CoV-2/FLU/RSV plus assay is intended as an aid in the diagnosis of influenza from Nasopharyngeal swab specimens and should not be used as a sole basis for treatment. Nasal washings and aspirates are unacceptable for Xpert Xpress SARS-CoV-2/FLU/RSV testing.  Fact Sheet for Patients: EntrepreneurPulse.com.au  Fact Sheet for Healthcare Providers: IncredibleEmployment.be  This test is not yet approved or cleared by the [place] FDA and has been authorized for detection and/or diagnosis of SARS-CoV-2 by FDA under an Emergency Use Authorization (EUA). This EUA will remain in effect (meaning this test can be used) for the duration of the COVID-19  declaration under Section 564(b)(1) of the Act, 21 U.S.C. section 360bbb-3(b)(1), unless the authorization is terminated or revoked.  Performed at [facility], [place] [address], [place], [place] [address]     Current Facility-Administered Medications  Medication Dose Route Frequency Provider Last Rate Last Admin   ARIPiprazole (ABILIFY) tablet 5 mg  5 mg Oral Daily Willliam Pettet C, NP       diclofenac Sodium (VOLTAREN) 1 % topical gel 2 g  2 g Topical Daily Ezequiel Essex, MD       DULoxetine (CYMBALTA) DR capsule 30 mg  30 mg Oral Daily Charmaine Downs C, NP       hydrOXYzine (ATARAX) tablet 25 mg  25 mg Oral TID PRN Delfin Gant, NP       traZODone (DESYREL) tablet 50 mg  50 mg Oral QHS Charmaine Downs C, NP       Current Outpatient Medications  Medication Sig Dispense Refill   albuterol (ACCUNEB) 1.25 MG/3ML nebulizer solution Take 3 mLs (1.25 mg total) by nebulization every 6 (six) hours as needed. 75 mL 1  aspirin 81 MG chewable tablet Chew 81 mg by mouth daily.     atorvastatin (LIPITOR) 80 MG tablet Take 1 tablet (80 mg total) by mouth daily. 90 tablet 3   blood glucose meter kit and supplies KIT Dispense based on patient and insurance preference. Use once daily as directed. Dx code E11.9. 1 each 0   Blood Glucose Monitoring Suppl ([place]) w/Device KIT Use glucometer to test sugar. DX: E11.9 1 kit 0   budesonide-formoterol (SYMBICORT) 160-4.5 MCG/ACT inhaler Inhale 2 puffs into the lungs 2 (two) times daily. 10.2 g 2   canagliflozin (INVOKANA) 100 MG TABS tablet Take 1 tablet (100 mg total) by mouth daily before breakfast. 30 tablet 0   cyclobenzaprine (FLEXERIL) 10 MG tablet TAKE 1 TO 2 TABLETS BY MOUTH TWICE DAILY AS NEEDED FOR MUSCLE SPASM 30 tablet 0   diclofenac Sodium (VOLTAREN) 1 % GEL Apply topically.     ezetimibe (ZETIA) 10 MG tablet Take 1 tablet (10 mg total) by mouth daily. 90 tablet 3   fluticasone (FLONASE) 50  MCG/ACT nasal spray Place 2 sprays into both nostrils daily. 16 g 6   furosemide (LASIX) 40 MG tablet TAKE 1/2 (ONE-HALF) TABLET BY MOUTH ONCE DAILY AS NEEDED 30 tablet 0   gabapentin (NEURONTIN) 800 MG tablet Take 1 tablet (800 mg total) by mouth 3 (three) times daily. 90 tablet 3   glucose blood (ONETOUCH VERIO) test strip Use as instructed 100 each 12   HYDROcodone-acetaminophen (NORCO/VICODIN) 5-325 MG tablet TAKE 1 TABLET BY MOUTH EVERY 8 HOURS AS NEEDED FOR SEVERE PAIN     hydrOXYzine (VISTARIL) 50 MG capsule Take 1 capsule (50 mg total) by mouth every 8 (eight) hours as needed. 90 capsule 1   Lancets Misc. (ACCU-CHEK SOFTCLIX LANCET DEV) KIT Use as instructed to test sugars once daily.  Dx code:E11.9 1 kit 0   megestrol (MEGACE) 40 MG tablet Take 1 tablet (40 mg total) by mouth daily. 10 tablet 0   naproxen (NAPROSYN) 500 MG tablet Take 1 tablet (500 mg total) by mouth 2 (two) times daily with a meal. 30 tablet 3   omeprazole (PRILOSEC) 20 MG capsule Take 1 capsule (20 mg total) by mouth daily. 30 capsule 3   ondansetron (ZOFRAN) 8 MG tablet Take 1 tablet (8 mg total) by mouth every 8 (eight) hours as needed for nausea or vomiting. 20 tablet 0   OneTouch Delica Lancets 59D MISC 1 Units by Does not apply route daily. 100 each 2   Semaglutide, 1 MG/DOSE, 4 MG/3ML SOPN Inject 1 mg into the skin once a week. 3 mL 3   tiZANidine (ZANAFLEX) 4 MG tablet Take 1 tablet (4 mg total) by mouth every 6 (six) hours as needed for muscle spasms. 30 tablet 3   tolterodine (DETROL LA) 4 MG 24 hr capsule Take by mouth.     topiramate (TOPAMAX) 25 MG tablet Take by mouth.      Musculoskeletal: Strength & Muscle Tone:  seen in the stretcher Gait & Station:  lying down in stretcher Patient leans:  see above   Psychiatric Specialty Exam: Presentation  General Appearance: Casual Eye Contact:Good Speech:Clear and Coherent; Normal Rate Speech Volume:Normal Handedness:Right  Mood and Affect   Mood:Depressed; Angry Affect:Congruent; Labile  Thought Process  Thought Processes:Coherent; Goal Directed; Linear Descriptions of Associations:Intact  Orientation:Full (Time, Place and Person)  Thought Content:Logical  History of Schizophrenia/Schizoaffective disorder:No data recorded Duration of Psychotic Symptoms:No data recorded Hallucinations:Hallucinations: Auditory; Tactile Description of  Auditory Hallucinations: unable to understand what voices are saying.  Seeing and feeling bugs crawling all over her body.  Ideas of Reference:None  Suicidal Thoughts:Suicidal Thoughts: No  Homicidal Thoughts:Homicidal Thoughts: No   Sensorium  Memory:Recent Good; Immediate Good; Remote Fair Judgment:Fair Insight:Good  Executive Functions  Concentration:Good Attention Span:Good [place] of Knowledge:Good Language:Good  Psychomotor Activity  Psychomotor Activity:Psychomotor Activity: Normal  Assets  Assets:Communication Skills; Housing; Data processing manager; Leisure Time   Sleep  Sleep:Sleep: Fair  Physical Exam: Physical Exam Vitals and nursing note reviewed.  Constitutional:      Appearance: Normal appearance.  HENT:     Head: Normocephalic and atraumatic.     Nose: Nose normal.  Cardiovascular:     Rate and Rhythm: Normal rate and regular rhythm.  Pulmonary:     Effort: Pulmonary effort is normal.  Musculoskeletal:        General: Normal range of motion.     Cervical back: Normal range of motion.  Skin:    General: Skin is warm and dry.  Neurological:     Mental Status: She is alert and oriented to person, place, and time.    Review of Systems  Constitutional: Negative.   HENT: Negative.    Eyes: Negative.   Respiratory: Negative.    Cardiovascular: Negative.   Gastrointestinal: Negative.   Genitourinary: Negative.   Musculoskeletal: Negative.   Skin: Negative.   Neurological: Negative.   Endo/Heme/Allergies: Negative.    Psychiatric/Behavioral:  Positive for depression and hallucinations. The patient has insomnia.    Blood pressure (!) 165/83, pulse 99, temperature 98.4 F (36.9 C), temperature source Oral, resp. rate 18, height 5' (1.524 m), weight 104.3 kg, last menstrual period 12/03/2016, SpO2 100 %. Body mass index is 44.92 kg/m.  Medical Decision Making: Patient meets criteria for inpatient hospitalization.  Patient needs to resume medications while we seek bed placement.  Patient will be hospitalized for stabilization.Goal is to treat and refer back to outpatient Psychiatrist.  Problem 1: Delusional disorder currently symptomatic  Problem 2: Tactile Hallucination  Problem 3: Bipolar disorder, depressed.  Disposition:  Admit and seek bed placement.  Delfin Gant, NP-PMHNP-BC 04/05/2022 12:47 PM

## 2022-04-05 NOTE — ED Notes (Signed)
Pt advises being unable to provide urine sample at this time, will monitor. 

## 2022-04-05 NOTE — ED Notes (Signed)
Assumed care of pt with RN oversight. Pt A&Ox4, no respiratory distress.

## 2022-04-05 NOTE — ED Notes (Addendum)
Pt has 1 t shirt placed in pt belongings bag and put in hall D cabinet behind nurse station. Pt changed into burgundy scrubs. Pt has reading glasses with her. Pt wanded by security

## 2022-04-06 DIAGNOSIS — R030 Elevated blood-pressure reading, without diagnosis of hypertension: Secondary | ICD-10-CM | POA: Diagnosis not present

## 2022-04-06 LAB — CBC WITH DIFFERENTIAL/PLATELET
Abs Immature Granulocytes: 0.03 10*3/uL (ref 0.00–0.07)
Basophils Absolute: 0 10*3/uL (ref 0.0–0.1)
Basophils Relative: 0 %
Eosinophils Absolute: 0.3 10*3/uL (ref 0.0–0.5)
Eosinophils Relative: 3 %
HCT: 40.8 % (ref 36.0–46.0)
Hemoglobin: 13.8 g/dL (ref 12.0–15.0)
Immature Granulocytes: 0 %
Lymphocytes Relative: 32 %
Lymphs Abs: 2.9 10*3/uL (ref 0.7–4.0)
MCH: 31.6 pg (ref 26.0–34.0)
MCHC: 33.8 g/dL (ref 30.0–36.0)
MCV: 93.4 fL (ref 80.0–100.0)
Monocytes Absolute: 0.6 10*3/uL (ref 0.1–1.0)
Monocytes Relative: 6 %
Neutro Abs: 5.4 10*3/uL (ref 1.7–7.7)
Neutrophils Relative %: 59 %
Platelets: 309 10*3/uL (ref 150–400)
RBC: 4.37 MIL/uL (ref 3.87–5.11)
RDW: 13.6 % (ref 11.5–15.5)
WBC: 9.2 10*3/uL (ref 4.0–10.5)
nRBC: 0 % (ref 0.0–0.2)

## 2022-04-06 MED ORDER — AMLODIPINE BESYLATE 5 MG PO TABS
5.0000 mg | ORAL_TABLET | Freq: Every day | ORAL | Status: DC
  Administered 2022-04-06 – 2022-04-07 (×2): 5 mg via ORAL
  Filled 2022-04-06 (×2): qty 1

## 2022-04-06 MED ORDER — GABAPENTIN 400 MG PO CAPS
800.0000 mg | ORAL_CAPSULE | Freq: Three times a day (TID) | ORAL | Status: DC
Start: 2022-04-06 — End: 2022-04-07
  Administered 2022-04-06 – 2022-04-07 (×4): 800 mg via ORAL
  Filled 2022-04-06 (×4): qty 2

## 2022-04-06 MED ORDER — ACETAMINOPHEN 500 MG PO TABS
1000.0000 mg | ORAL_TABLET | Freq: Four times a day (QID) | ORAL | Status: DC | PRN
  Administered 2022-04-06: 1000 mg via ORAL
  Filled 2022-04-06: qty 2

## 2022-04-06 MED ORDER — HYDROCHLOROTHIAZIDE 12.5 MG PO TABS
12.5000 mg | ORAL_TABLET | Freq: Every day | ORAL | Status: DC
  Administered 2022-04-06 – 2022-04-07 (×2): 12.5 mg via ORAL
  Filled 2022-04-06 (×2): qty 1

## 2022-04-06 MED ORDER — SEMAGLUTIDE (1 MG/DOSE) 4 MG/3ML ~~LOC~~ SOPN
1.5000 mg | PEN_INJECTOR | SUBCUTANEOUS | Status: DC
Start: 2022-04-07 — End: 2022-04-07

## 2022-04-06 NOTE — ED Provider Notes (Signed)
Emergency Medicine Observation Re-evaluation Note  Elizabeth Diaz is a 55 y.o. female, seen on rounds today.  Pt initially presented to the ED for complaints of Hallucinations Currently, the patient is resting comfortably.  Physical Exam  BP (!) 180/95 (BP Location: Right Arm)   Pulse 88   Temp 97.9 F (36.6 C) (Oral)   Resp 20   Ht 5' (1.524 m)   Wt 104.3 kg   LMP 12/03/2016 (Approximate)   SpO2 96%   BMI 44.92 kg/m  Physical Exam General: NAD   ED Course / MDM  EKG:EKG Interpretation  Date/Time:  Saturday April 04 2022 23:14:21 EDT Ventricular Rate:  102 PR Interval:  114 QRS Duration: 67 QT Interval:  375 QTC Calculation: 489 R Axis:   30 Text Interpretation: Sinus tachycardia Paired ventricular premature complexes Aberrant conduction of SV complex(es) Right atrial enlargement Low voltage, precordial leads Abnormal R-wave progression, early transition Borderline T abnormalities, anterior leads Borderline prolonged QT interval Confirmed by Ripley Fraise [phone]) on 04/05/2022 9:14:12 AM  I have reviewed the labs performed to date as well as medications administered while in observation.  Recent changes in the last 24 hours include no acute events were reported.  Plan  Current plan is for placement.  JEANE CASHATT is not under involuntary commitment.     Valarie Merino, MD 04/06/22 [phone]

## 2022-04-06 NOTE — ED Notes (Addendum)
Pt breakfast has arrived.

## 2022-04-06 NOTE — Progress Notes (Signed)
[facility] ED Progress Note  04/06/2022 12:17 PM Elizabeth Diaz  MRN:  [id number]   Subjective:  Patient was seen in bed sleeping but easily aroused.  Once she is awake she is scratching her skin because she is feeling and seeing bugs crawl all over her.  Patient reported passive suicidal ideation with no plans.  Patient has already restarted home medications and will be making changes as needed.  She continues to meet criteria for inpatient Hospitalization.  She denied HI/and no mention of Paranoia. Principal Problem: Delusional disorder currently symptomatic ([place]) Diagnosis:  Principal Problem:   Delusional disorder currently symptomatic ([place]) Active Problems:   Bipolar affective disorder, depressed ([place])   ED Assessment Time Calculation: Start Time: 1150 Stop Time: 1210 Total Time in Minutes (Assessment Completion): 20   Past Psychiatric History: hx Bipolar disorder and depression.  No inpatient Psychiatry hospitalization hx noted.  Pat Medications includes Cymbalta, Vistaril, Ability, Gabapentin, Wellbutrin, Amitriptyline.  [place] Scale:  [place] ED from 04/04/2022 in [place] DEPT Most recent reading at 04/04/2022 10:25 PM ED from 04/04/2022 in [facility] Most recent reading at 04/04/2022 11:56 AM ED from 11/08/2021 in [facility] Most recent reading at 11/08/2021  2:00 PM  C-SSRS RISK CATEGORY No Risk No Risk No Risk       Past Medical History:  Past Medical History:  Diagnosis Date   Arthritis    Atypical chest pain 12/12/2015   Benign neoplasm of rectum    Chronic pain    Congestive heart failure (CHF) (HCC)    COPD (chronic obstructive pulmonary disease) ([place])    Diabetes mellitus ([place])    Diverticulosis    Foot pain, left 07/01/2021   GERD (gastroesophageal reflux disease) 12/29/2016   Head pain 07/01/2021   HTN (hypertension) 12/29/2016   Hyperplastic colon  polyp    Lower extremity edema 12/12/2015   Mood altered 10/05/2015   Numbness    left leg   Obesity    Pneumothorax    Routine screening for STI (sexually transmitted infection) 08/07/2020   Schatzki's ring    Screen for sexually transmitted diseases 08/07/2020   Seizures ([place])    hx of, no seizures last few years   Type 2 diabetes mellitus ([place]) 01/27/2018   UTI (urinary tract infection) 01/29/2020    Past Surgical History:  Procedure Laterality Date   CHEST TUBE INSERTION     COLONOSCOPY WITH PROPOFOL N/A 11/01/2014   Procedure: COLONOSCOPY WITH PROPOFOL;  Surgeon: Jerene Bears, MD;  Location: [facility];  Service: Gastroenterology;  Laterality: N/A;   ESOPHAGOGASTRODUODENOSCOPY (EGD) WITH PROPOFOL N/A 07/29/2015   Procedure: ESOPHAGOGASTRODUODENOSCOPY (EGD) WITH PROPOFOL;  Surgeon: Jerene Bears, MD;  Location: [facility];  Service: Gastroenterology;  Laterality: N/A;   PERIPHERAL VASCULAR CATHETERIZATION N/A 01/07/2016   Procedure: Lower Extremity Angiography;  Surgeon: Adrian Prows, MD;  Location: [facility];  Service: Cardiovascular;  Laterality: N/A;   PERIPHERAL VASCULAR CATHETERIZATION Right 02/25/2016   Procedure: Peripheral Vascular Atherectomy;  Surgeon: Adrian Prows, MD;  Location: [facility];  Service: Cardiovascular;  Laterality: Right;  SFA ATHERECTOMY/DRUG COATED PTA   TUBAL LIGATION     Family History:  Family History  Problem Relation Age of Onset   Heart failure Father    Diabetes Father    Diabetes Maternal Grandmother    Diabetes Paternal Grandfather    Parkinson's disease Paternal Grandfather    Cancer Maternal Grandfather  unknown type   Family Psychiatric  History: see initial psychiatric evaluation Social History:  Social History   Substance and Sexual Activity  Alcohol Use Yes   Comment: occasionally     Social History   Substance and Sexual Activity  Drug Use Not Currently   Types: Cocaine, Marijuana   Comment: 20 year history  of crack cocaine. Haven't used any in two years.     Social History   Socioeconomic History   Marital status: Single    Spouse name: Not on file   Number of children: 2   Years of education: Not on file   Highest education level: Not on file  Occupational History   Occupation: Prep Cook  Tobacco Use   Smoking status: Some Days    Years: 0.00    Types: Cigarettes    Last attempt to quit: 05/04/2009    Years since quitting: 12.9    Passive exposure: Current   Smokeless tobacco: Never   Tobacco comments:    Smokes about 2 cigs / week  Vaping Use   Vaping Use: Never used  Substance and Sexual Activity   Alcohol use: Yes    Comment: occasionally   Drug use: Not Currently    Types: Cocaine, Marijuana    Comment: 20 year history of crack cocaine. Haven't used any in two years.    Sexual activity: Not on file  Other Topics Concern   Not on file  Social History Narrative   Lives in [place]    Works in [employer] at [employer] group)   Hobbies: listening to music. Bowling.    Social Determinants of Health   Financial Resource Strain: Not on file  Food Insecurity: Not on file  Transportation Needs: Not on file  Physical Activity: Not on file  Stress: Not on file  Social Connections: Not on file    Sleep: Good  Appetite:  Fair  Current Medications: Current Facility-Administered Medications  Medication Dose Route Frequency Provider Last Rate Last Admin   acetaminophen (TYLENOL) tablet 1,000 mg  1,000 mg Oral Q6H PRN Long, Wonda Olds, MD   1,000 mg at 04/06/22 0217   amLODipine (NORVASC) tablet 5 mg  5 mg Oral Daily Valarie Merino, MD   5 mg at 04/06/22 1013   ARIPiprazole (ABILIFY) tablet 5 mg  5 mg Oral Daily Vivian Neuwirth C, NP   5 mg at 04/06/22 0940   diclofenac Sodium (VOLTAREN) 1 % topical gel 2 g  2 g Topical Daily Ezequiel Essex, MD       DULoxetine (CYMBALTA) DR capsule 30 mg  30 mg Oral Daily Vinay Ertl C, NP   30 mg at 04/06/22 0940    gabapentin (NEURONTIN) capsule 800 mg  800 mg Oral TID Valarie Merino, MD   800 mg at 04/06/22 1013   hydrOXYzine (ATARAX) tablet 25 mg  25 mg Oral TID PRN Charmaine Downs C, NP   25 mg at 04/06/22 0217   [START ON 04/07/2022] Semaglutide (1 MG/DOSE) SOPN 1.5 mg  1.5 mg Subcutaneous Q Tue Messick, Peter C, MD       traZODone (DESYREL) tablet 50 mg  50 mg Oral QHS Charmaine Downs C, NP   50 mg at 04/05/22 2028   Current Outpatient Medications  Medication Sig Dispense Refill   aspirin 81 MG chewable tablet Chew 81 mg by mouth daily.     budesonide-formoterol (SYMBICORT) 160-4.5 MCG/ACT inhaler Inhale 2 puffs into the lungs 2 (two) times daily. (  Patient taking differently: Inhale 2 puffs into the lungs 2 (two) times daily as needed (shortness of breath/wheezing).) 10.2 g 2   cyclobenzaprine (FLEXERIL) 10 MG tablet TAKE 1 TO 2 TABLETS BY MOUTH TWICE DAILY AS NEEDED FOR MUSCLE SPASM (Patient taking differently: Take 10 mg by mouth 3 (three) times daily.) 30 tablet 0   diclofenac Sodium (VOLTAREN) 1 % GEL Apply 1 Application topically 3 (three) times daily as needed (pain).     fluticasone (FLONASE) 50 MCG/ACT nasal spray Place 2 sprays into both nostrils daily. (Patient taking differently: Place 2 sprays into both nostrils daily as needed for allergies or rhinitis.) 16 g 6   furosemide (LASIX) 40 MG tablet TAKE 1/2 (ONE-HALF) TABLET BY MOUTH ONCE DAILY AS NEEDED (Patient taking differently: Take 20 mg by mouth daily as needed for fluid or edema.) 30 tablet 0   gabapentin (NEURONTIN) 800 MG tablet Take 1 tablet (800 mg total) by mouth 3 (three) times daily. 90 tablet 3   hydrOXYzine (VISTARIL) 50 MG capsule Take 1 capsule (50 mg total) by mouth every 8 (eight) hours as needed. (Patient taking differently: Take 50 mg by mouth every 8 (eight) hours.) 90 capsule 1   naproxen (NAPROSYN) 500 MG tablet Take 1 tablet (500 mg total) by mouth 2 (two) times daily with a meal. (Patient taking differently: Take  500 mg by mouth 2 (two) times daily as needed (pain).) 30 tablet 3   Semaglutide, 1 MG/DOSE, 4 MG/3ML SOPN Inject 1 mg into the skin once a week. (Patient taking differently: Inject 1.5 mg into the skin every Tuesday.) 3 mL 3   tiZANidine (ZANAFLEX) 4 MG tablet Take 1 tablet (4 mg total) by mouth every 6 (six) hours as needed for muscle spasms. (Patient taking differently: Take 4 mg by mouth 2 (two) times daily.) 30 tablet 3   albuterol (ACCUNEB) 1.25 MG/3ML nebulizer solution Take 3 mLs (1.25 mg total) by nebulization every 6 (six) hours as needed. (Patient not taking: Reported on 04/06/2022) 75 mL 1   atorvastatin (LIPITOR) 80 MG tablet Take 1 tablet (80 mg total) by mouth daily. (Patient not taking: Reported on 04/06/2022) 90 tablet 3   blood glucose meter kit and supplies KIT Dispense based on patient and insurance preference. Use once daily as directed. Dx code E11.9. 1 each 0   Blood Glucose Monitoring Suppl ([place]) w/Device KIT Use glucometer to test sugar. DX: E11.9 1 kit 0   canagliflozin (INVOKANA) 100 MG TABS tablet Take 1 tablet (100 mg total) by mouth daily before breakfast. (Patient not taking: Reported on 04/06/2022) 30 tablet 0   ezetimibe (ZETIA) 10 MG tablet Take 1 tablet (10 mg total) by mouth daily. (Patient not taking: Reported on 04/06/2022) 90 tablet 3   glucose blood (ONETOUCH VERIO) test strip Use as instructed 100 each 12   Lancets Misc. (ACCU-CHEK SOFTCLIX LANCET DEV) KIT Use as instructed to test sugars once daily.  Dx code:E11.9 1 kit 0   megestrol (MEGACE) 40 MG tablet Take 1 tablet (40 mg total) by mouth daily. (Patient not taking: Reported on 04/06/2022) 10 tablet 0   omeprazole (PRILOSEC) 20 MG capsule Take 1 capsule (20 mg total) by mouth daily. (Patient not taking: Reported on 04/06/2022) 30 capsule 3   ondansetron (ZOFRAN) 8 MG tablet Take 1 tablet (8 mg total) by mouth every 8 (eight) hours as needed for nausea or vomiting. (Patient not taking:  Reported on 04/06/2022) 20 tablet 0   OneTouch Delica  Lancets 33G MISC 1 Units by Does not apply route daily. 100 each 2    Lab Results:  Results for orders placed or performed during the hospital encounter of 04/04/22 (from the past 48 hour(s))  Comprehensive metabolic panel     Status: Abnormal   Collection Time: 04/04/22 10:38 PM  Result Value Ref Range   Sodium 139 135 - 145 mmol/L   Potassium 3.9 3.5 - 5.1 mmol/L   Chloride 103 98 - 111 mmol/L   CO2 25 22 - 32 mmol/L   Glucose, Bld 134 (H) 70 - 99 mg/dL    Comment: Glucose reference range applies only to samples taken after fasting for at least 8 hours.   BUN 19 6 - 20 mg/dL   Creatinine, Ser 0.94 0.44 - 1.00 mg/dL   Calcium 9.3 8.9 - 10.3 mg/dL   Total Protein 8.4 (H) 6.5 - 8.1 g/dL   Albumin 3.9 3.5 - 5.0 g/dL   AST 34 15 - 41 U/L   ALT 18 0 - 44 U/L   Alkaline Phosphatase 86 38 - 126 U/L   Total Bilirubin 1.3 (H) 0.3 - 1.2 mg/dL   GFR, Estimated >60 >60 mL/min    Comment: (NOTE) Calculated using the CKD-EPI Creatinine Equation (2021)    Anion gap 11 5 - 15    Comment: Performed at [facility], [place] [address] [place], [place] [address]  Ethanol     Status: None   Collection Time: 04/04/22 10:38 PM  Result Value Ref Range   Alcohol, Ethyl (B) <10 <10 mg/dL    Comment: (NOTE) Lowest detectable limit for serum alcohol is 10 mg/dL.  For medical purposes only. Performed at [facility], [place] [address], [place], [place] [address]   CBC with Diff     Status: Abnormal   Collection Time: 04/04/22 10:38 PM  Result Value Ref Range   WBC 16.3 (H) 4.0 - 10.5 K/uL   RBC 4.50 3.87 - 5.11 MIL/uL   Hemoglobin 14.0 12.0 - 15.0 g/dL   HCT 42.3 36.0 - 46.0 %   MCV 94.0 80.0 - 100.0 fL   MCH 31.1 26.0 - 34.0 pg   MCHC 33.1 30.0 - 36.0 g/dL   RDW 14.2 11.5 - 15.5 %   Platelets 353 150 - 400 K/uL   nRBC 0.2 0.0 - 0.2 %   Neutrophils Relative % 67 %   Neutro Abs 10.8 (H) 1.7 - 7.7 K/uL    Lymphocytes Relative 24 %   Lymphs Abs 3.9 0.7 - 4.0 K/uL   Monocytes Relative 7 %   Monocytes Absolute 1.2 (H) 0.1 - 1.0 K/uL   Eosinophils Relative 2 %   Eosinophils Absolute 0.3 0.0 - 0.5 K/uL   Basophils Relative 0 %   Basophils Absolute 0.1 0.0 - 0.1 K/uL   Immature Granulocytes 0 %   Abs Immature Granulocytes 0.05 0.00 - 0.07 K/uL    Comment: Performed at [facility], [place] [address] [place],  [address]  I-Stat beta hCG blood, ED     Status: None   Collection Time: 04/04/22 10:45 PM  Result Value Ref Range   I-stat hCG, quantitative <5.0 <5 mIU/mL   Comment 3            Comment:   GEST. AGE      CONC.  (mIU/mL)   <=1 WEEK        5 - 50     2 WEEKS  50 - 500     3 WEEKS       100 - 10,000     4 WEEKS     1,000 - 30,000        FEMALE AND NON-PREGNANT FEMALE:     LESS THAN 5 mIU/mL   Resp Panel by RT-PCR (Flu A&B, Covid) Anterior Nasal Swab     Status: None   Collection Time: 04/05/22  3:07 AM   Specimen: Anterior Nasal Swab  Result Value Ref Range   SARS Coronavirus 2 by RT PCR NEGATIVE NEGATIVE    Comment: (NOTE) SARS-CoV-2 target nucleic acids are NOT DETECTED.  The SARS-CoV-2 RNA is generally detectable in upper respiratory specimens during the acute phase of infection. The lowest concentration of SARS-CoV-2 viral copies this assay can detect is 138 copies/mL. A negative result does not preclude SARS-Cov-2 infection and should not be used as the sole basis for treatment or other patient management decisions. A negative result may occur with  improper specimen collection/handling, submission of specimen other than nasopharyngeal swab, presence of viral mutation(s) within the areas targeted by this assay, and inadequate number of viral copies(<138 copies/mL). A negative result must be combined with clinical observations, patient history, and epidemiological information. The expected result is Negative.  Fact Sheet for Patients:   EntrepreneurPulse.com.au  Fact Sheet for Healthcare Providers:  IncredibleEmployment.be  This test is no t yet approved or cleared by the [place] FDA and  has been authorized for detection and/or diagnosis of SARS-CoV-2 by FDA under an Emergency Use Authorization (EUA). This EUA will remain  in effect (meaning this test can be used) for the duration of the COVID-19 declaration under Section 564(b)(1) of the Act, 21 U.S.C.section 360bbb-3(b)(1), unless the authorization is terminated  or revoked sooner.       Influenza A by PCR NEGATIVE NEGATIVE   Influenza B by PCR NEGATIVE NEGATIVE    Comment: (NOTE) The Xpert Xpress SARS-CoV-2/FLU/RSV plus assay is intended as an aid in the diagnosis of influenza from Nasopharyngeal swab specimens and should not be used as a sole basis for treatment. Nasal washings and aspirates are unacceptable for Xpert Xpress SARS-CoV-2/FLU/RSV testing.  Fact Sheet for Patients: EntrepreneurPulse.com.au  Fact Sheet for Healthcare Providers: IncredibleEmployment.be  This test is not yet approved or cleared by the [place] FDA and has been authorized for detection and/or diagnosis of SARS-CoV-2 by FDA under an Emergency Use Authorization (EUA). This EUA will remain in effect (meaning this test can be used) for the duration of the COVID-19 declaration under Section 564(b)(1) of the Act, 21 U.S.C. section 360bbb-3(b)(1), unless the authorization is terminated or revoked.  Performed at [facility], [place] [address] [place], [place] [address]   Urine rapid drug screen (hosp performed)     Status: Abnormal   Collection Time: 04/05/22  3:10 PM  Result Value Ref Range   Opiates NONE DETECTED NONE DETECTED   Cocaine POSITIVE (A) NONE DETECTED   Benzodiazepines NONE DETECTED NONE DETECTED   Amphetamines NONE DETECTED NONE DETECTED   Tetrahydrocannabinol  POSITIVE (A) NONE DETECTED   Barbiturates NONE DETECTED NONE DETECTED    Comment: (NOTE) DRUG SCREEN FOR MEDICAL PURPOSES ONLY.  IF CONFIRMATION IS NEEDED FOR ANY PURPOSE, NOTIFY LAB WITHIN 5 DAYS.  LOWEST DETECTABLE LIMITS FOR URINE DRUG SCREEN Drug Class                     Cutoff (ng/mL) Amphetamine and metabolites  1000 Barbiturate and metabolites    200 Benzodiazepine                 175 Tricyclics and metabolites     300 Opiates and metabolites        300 Cocaine and metabolites        300 THC                            50 Performed at [facility], [place] [address] [place], [place] [address]   Urinalysis, Routine w reflex microscopic Urine, Clean Catch     Status: Abnormal   Collection Time: 04/05/22  3:11 PM  Result Value Ref Range   Color, Urine YELLOW YELLOW   APPearance HAZY (A) CLEAR   Specific Gravity, Urine 1.031 (H) 1.005 - 1.030   pH 5.0 5.0 - 8.0   Glucose, UA NEGATIVE NEGATIVE mg/dL   Hgb urine dipstick SMALL (A) NEGATIVE   Bilirubin Urine NEGATIVE NEGATIVE   Ketones, ur NEGATIVE NEGATIVE mg/dL   Protein, ur 30 (A) NEGATIVE mg/dL   Nitrite NEGATIVE NEGATIVE   Leukocytes,Ua SMALL (A) NEGATIVE   RBC / HPF 11-20 0 - 5 RBC/hpf   WBC, UA 21-50 0 - 5 WBC/hpf   Bacteria, UA MANY (A) NONE SEEN   Squamous Epithelial / LPF 11-20 0 - 5   Mucus PRESENT     Comment: Performed at [facility], [place] [address] [place], [place] [address]    Blood Alcohol level:  Lab Results  Component Value Date   ETH <10 04/04/2022   ETH <10 08/25/2018    Physical Findings:  CIWA:    COWS:     Musculoskeletal: Strength & Muscle Tone:  lying in bed Gait & Station:  lying in bed Patient leans:  see above  Psychiatric Specialty Exam:  Presentation  General Appearance: Appropriate for Environment; Neat  Eye Contact:Fair  Speech:Clear and Coherent; Normal Rate  Speech Volume:Normal  Handedness:Right   Mood and Affect   Mood:Depressed  Affect:Congruent; Depressed   Thought Process  Thought Processes:Coherent; Goal Directed; Linear  Descriptions of Associations:Intact  Orientation:Full (Time, Place and Person)  Thought Content:Logical  History of Schizophrenia/Schizoaffective disorder:No data recorded Duration of Psychotic Symptoms:No data recorded Hallucinations:Hallucinations: Auditory Description of Auditory Hallucinations: Unable to described what the voices are saying.  Ideas of Reference:Delusions  Suicidal Thoughts:Suicidal Thoughts: Yes, Passive (suicide ideation off and on.)  Homicidal Thoughts:Homicidal Thoughts: No   Sensorium  Memory:Immediate Good; Recent Good; Remote Good  Judgment:Fair  Insight:Fair   Executive Functions  Concentration:Good  Attention Span:Good  [place] of Knowledge:Good  Language:Good   Psychomotor Activity  Psychomotor Activity:Psychomotor Activity: Normal   Assets  Assets:Communication Skills; Desire for Improvement; Housing   Sleep  Sleep:Sleep: Good    Physical Exam: Physical Exam Vitals reviewed.  Constitutional:      Appearance: Normal appearance.  HENT:     Head: Normocephalic and atraumatic.     Nose: Nose normal.  Cardiovascular:     Rate and Rhythm: Normal rate and regular rhythm.  Pulmonary:     Effort: Pulmonary effort is normal.  Musculoskeletal:        General: Normal range of motion.     Cervical back: Normal range of motion.  Skin:    General: Skin is warm and dry.  Neurological:     General: No focal deficit present.     Mental Status: She is  alert and oriented to person, place, and time.    Review of Systems  Constitutional: Negative.   HENT: Negative.    Eyes: Negative.   Respiratory: Negative.    Cardiovascular: Negative.   Gastrointestinal: Negative.   Genitourinary: Negative.   Musculoskeletal: Negative.   Skin:        Reports bug crawling all over her body and under her skin.   Cuts all over arms and abdomen.  Neurological: Negative.   Endo/Heme/Allergies: Negative.   Psychiatric/Behavioral:  Positive for depression, hallucinations, substance abuse and suicidal ideas.    Blood pressure (!) 176/72, pulse 77, temperature 97.7 F (36.5 C), temperature source Oral, resp. rate 20, height 5' (1.524 m), weight 104.3 kg, last menstrual period 12/03/2016, SpO2 96 %. Body mass index is 44.92 kg/m.  Medical Decision Making: Patient continues to meet criteria for inpatient psychiatry hospitalization.  She continues to see and feel the bugs crawling all over her and continues to scratch her skin with her finder nails some areas open.  She reported passive suicide ideation with no plans.  She is on Abilify 5 mg daily Psychosis and Cymbalta 30 mg daily for depression  Problem 1: Delusional disorder currently symptomatic  Problem 2: Tactile Hallucination  Problem 3: Bipolar disorder, depressed  Delfin Gant, NP-PMHNP-BC 04/06/2022, 12:17 PM

## 2022-04-06 NOTE — ED Provider Notes (Signed)
Patient appears to have a chronic history of hypertension that is not treated as an outpatient.  Blood pressure seems higher this afternoon than when she was given amlodipine this morning.  We will add on HCTZ as well.   Sherwood Gambler, MD 04/06/22 [phone]

## 2022-04-06 NOTE — Progress Notes (Signed)
At 8:05pm CSW requested that [place], RN review for pt [facility] CSW will assist and follow.  Benjaman Kindler, MSW, [facility] 04/06/2022 9:55 PM

## 2022-04-07 ENCOUNTER — Encounter (HOSPITAL_COMMUNITY): Payer: Self-pay

## 2022-04-07 DIAGNOSIS — R030 Elevated blood-pressure reading, without diagnosis of hypertension: Secondary | ICD-10-CM | POA: Diagnosis not present

## 2022-04-07 DIAGNOSIS — F22 Delusional disorders: Secondary | ICD-10-CM

## 2022-04-07 MED ORDER — SEMAGLUTIDE (1 MG/DOSE) 4 MG/3ML ~~LOC~~ SOPN: 1.5000 mg | PEN_INJECTOR | SUBCUTANEOUS | 0 refills | Status: DC

## 2022-04-07 MED ORDER — AMLODIPINE BESYLATE 5 MG PO TABS: 5.0000 mg | ORAL_TABLET | Freq: Every day | ORAL | 0 refills | Status: DC

## 2022-04-07 MED ORDER — DULOXETINE HCL 30 MG PO CPEP: 30.0000 mg | ORAL_CAPSULE | Freq: Every day | ORAL | 0 refills | Status: DC

## 2022-04-07 MED ORDER — MOMETASONE FURO-FORMOTEROL FUM 200-5 MCG/ACT IN AERO: 2.0000 | INHALATION_SPRAY | Freq: Two times a day (BID) | RESPIRATORY_TRACT | 0 refills | Status: AC

## 2022-04-07 MED ORDER — TRAZODONE HCL 50 MG PO TABS: 50.0000 mg | ORAL_TABLET | Freq: Every day | ORAL | 0 refills | Status: AC

## 2022-04-07 MED ORDER — ASPIRIN 81 MG PO CHEW
81.0000 mg | CHEWABLE_TABLET | Freq: Every day | ORAL | Status: DC
  Administered 2022-04-07: 81 mg via ORAL
  Filled 2022-04-07: qty 1

## 2022-04-07 MED ORDER — GABAPENTIN 400 MG PO CAPS: 400.0000 mg | ORAL_CAPSULE | Freq: Three times a day (TID) | ORAL | 0 refills | Status: DC

## 2022-04-07 MED ORDER — FLUTICASONE PROPIONATE 50 MCG/ACT NA SUSP: 2.0000 | Freq: Every day | NASAL | Status: DC | PRN

## 2022-04-07 MED ORDER — ARIPIPRAZOLE 5 MG PO TABS: 5.0000 mg | ORAL_TABLET | Freq: Every day | ORAL | 0 refills | Status: DC

## 2022-04-07 MED ORDER — HYDROCHLOROTHIAZIDE 12.5 MG PO TABS: 12.5000 mg | ORAL_TABLET | Freq: Every day | ORAL | 0 refills | Status: DC

## 2022-04-07 MED ORDER — FLUTICASONE PROPIONATE 50 MCG/ACT NA SUSP: 2.0000 | Freq: Every day | NASAL | 0 refills | Status: DC | PRN

## 2022-04-07 MED ORDER — ATORVASTATIN CALCIUM 40 MG PO TABS
80.0000 mg | ORAL_TABLET | Freq: Every day | ORAL | Status: DC
  Administered 2022-04-07: 80 mg via ORAL
  Filled 2022-04-07: qty 2

## 2022-04-07 MED ORDER — HYDROXYZINE HCL 25 MG PO TABS: 25.0000 mg | ORAL_TABLET | Freq: Three times a day (TID) | ORAL | 0 refills | Status: DC | PRN

## 2022-04-07 MED ORDER — ONDANSETRON HCL 4 MG PO TABS
4.0000 mg | ORAL_TABLET | Freq: Three times a day (TID) | ORAL | Status: DC | PRN
Start: 2022-04-07 — End: 2022-04-07

## 2022-04-07 MED ORDER — MOMETASONE FURO-FORMOTEROL FUM 200-5 MCG/ACT IN AERO
2.0000 | INHALATION_SPRAY | Freq: Two times a day (BID) | RESPIRATORY_TRACT | Status: DC
  Filled 2022-04-07: qty 8.8

## 2022-04-07 NOTE — Discharge Summary (Signed)
[facility] Psych ED Discharge  04/07/2022 2:32 PM Elizabeth Diaz  MRN:  [id number]  Principal Problem: Delusional disorder currently symptomatic [facility]) Discharge Diagnoses: Principal Problem:   Delusional disorder currently symptomatic ([place]) Active Problems:   Bipolar affective disorder, depressed ([place])  Clinical Impression:  Final diagnoses:  Hallucinations  Elevated blood pressure reading   Subjective: Patient was seen awake and watching TV in her ER Room.  She reported mood improved since getting back on Medications.  Patient admitted that she need to remain on her medications.  She reported improved sleep and appetite.  She denied feeling suicidal or hearing voices or seeing bugs.  Patient is no longer scratching her skin or making superficial marks looking for bugs.  She is being discharged with referral for outpatient Psychiatric care and therapy if needed. While in the ER her BP was elevated and medications were prescribed for that.  Patient is advised to contact her PCP  for medical care.  She denied SI/HI/AVH and no mention of paranoia.  Patient is discharged home.  ED Assessment Time Calculation: Start Time: 1401 Stop Time: 1432 Total Time in Minutes (Assessment Completion): 31   Past Psychiatric History: see initial Psychiatric evaluation  Past Medical History:  Past Medical History:  Diagnosis Date   Arthritis    Atypical chest pain 12/12/2015   Benign neoplasm of rectum    Chronic pain    Congestive heart failure (CHF) (HCC)    COPD (chronic obstructive pulmonary disease) (HCC)    Diabetes mellitus ([place])    Diverticulosis    Foot pain, left 07/01/2021   GERD (gastroesophageal reflux disease) 12/29/2016   Head pain 07/01/2021   HTN (hypertension) 12/29/2016   Hyperplastic colon polyp    Lower extremity edema 12/12/2015   Mood altered 10/05/2015   Numbness    left leg   Obesity    Pneumothorax    Routine screening for STI (sexually transmitted infection) 08/07/2020    Schatzki's ring    Screen for sexually transmitted diseases 08/07/2020   Seizures ([place])    hx of, no seizures last few years   Type 2 diabetes mellitus ([place]) 01/27/2018   UTI (urinary tract infection) 01/29/2020    Past Surgical History:  Procedure Laterality Date   CHEST TUBE INSERTION     COLONOSCOPY WITH PROPOFOL N/A 11/01/2014   Procedure: COLONOSCOPY WITH PROPOFOL;  Surgeon: Jerene Bears, MD;  Location: [facility];  Service: Gastroenterology;  Laterality: N/A;   ESOPHAGOGASTRODUODENOSCOPY (EGD) WITH PROPOFOL N/A 07/29/2015   Procedure: ESOPHAGOGASTRODUODENOSCOPY (EGD) WITH PROPOFOL;  Surgeon: Jerene Bears, MD;  Location: [facility];  Service: Gastroenterology;  Laterality: N/A;   PERIPHERAL VASCULAR CATHETERIZATION N/A 01/07/2016   Procedure: Lower Extremity Angiography;  Surgeon: Adrian Prows, MD;  Location: [place] CV LAB;  Service: Cardiovascular;  Laterality: N/A;   PERIPHERAL VASCULAR CATHETERIZATION Right 02/25/2016   Procedure: Peripheral Vascular Atherectomy;  Surgeon: Adrian Prows, MD;  Location: [place] CV LAB;  Service: Cardiovascular;  Laterality: Right;  SFA ATHERECTOMY/DRUG COATED PTA   TUBAL LIGATION     Family History:  Family History  Problem Relation Age of Onset   Heart failure Father    Diabetes Father    Diabetes Maternal Grandmother    Diabetes Paternal Grandfather    Parkinson's disease Paternal Grandfather    Cancer Maternal Grandfather        unknown type   Family Psychiatric  History: see initial psychiatric evaluation Social History:  Social History   Substance and Sexual  Activity  Alcohol Use Yes   Comment: occasionally     Social History   Substance and Sexual Activity  Drug Use Not Currently   Types: Cocaine, Marijuana   Comment: 20 year history of crack cocaine. Haven't used any in two years.     Social History   Socioeconomic History   Marital status: Single    Spouse name: Not on file   Number of children: 2   Years of education:  Not on file   Highest education level: Not on file  Occupational History   Occupation: Prep Cook  Tobacco Use   Smoking status: Some Days    Years: 0.00    Types: Cigarettes    Last attempt to quit: 05/04/2009    Years since quitting: 12.9    Passive exposure: Current   Smokeless tobacco: Never   Tobacco comments:    Smokes about 2 cigs / week  Vaping Use   Vaping Use: Never used  Substance and Sexual Activity   Alcohol use: Yes    Comment: occasionally   Drug use: Not Currently    Types: Cocaine, Marijuana    Comment: 20 year history of crack cocaine. Haven't used any in two years.    Sexual activity: Not on file  Other Topics Concern   Not on file  Social History Narrative   Lives in [place]    Works in [employer] group)   Hobbies: listening to music. Bowling.    Social Determinants of Health   Financial Resource Strain: Not on file  Food Insecurity: Not on file  Transportation Needs: Not on file  Physical Activity: Not on file  Stress: Not on file  Social Connections: Not on file    Tobacco Cessation:  N/A, patient does not currently use tobacco products  Current Medications: Current Facility-Administered Medications  Medication Dose Route Frequency Provider Last Rate Last Admin   acetaminophen (TYLENOL) tablet 1,000 mg  1,000 mg Oral Q6H PRN Long, Wonda Olds, MD   1,000 mg at 04/06/22 0217   amLODipine (NORVASC) tablet 5 mg  5 mg Oral Daily Valarie Merino, MD   5 mg at 04/07/22 0941   ARIPiprazole (ABILIFY) tablet 5 mg  5 mg Oral Daily Charmaine Downs C, NP   5 mg at 04/07/22 0941   aspirin chewable tablet 81 mg  81 mg Oral Daily Wynona Dove A, DO   81 mg at 04/07/22 0944   atorvastatin (LIPITOR) tablet 80 mg  80 mg Oral Daily Wynona Dove A, DO   80 mg at 04/07/22 0944   DULoxetine (CYMBALTA) DR capsule 30 mg  30 mg Oral Daily Newt Levingston C, NP   30 mg at 04/07/22 0941   fluticasone (FLONASE) 50 MCG/ACT nasal spray 2 spray  2  spray Each Nare Daily PRN Wynona Dove A, DO       gabapentin (NEURONTIN) capsule 800 mg  800 mg Oral TID Valarie Merino, MD   800 mg at 04/07/22 0941   hydrochlorothiazide (HYDRODIURIL) tablet 12.5 mg  12.5 mg Oral Daily Sherwood Gambler, MD   12.5 mg at 04/07/22 0941   hydrOXYzine (ATARAX) tablet 25 mg  25 mg Oral TID PRN Charmaine Downs C, NP   25 mg at 04/06/22 2111   mometasone-formoterol (DULERA) 200-5 MCG/ACT inhaler 2 puff  2 puff Inhalation BID Wynona Dove A, DO       ondansetron [facility]) tablet 4 mg  4 mg Oral Q8H PRN Wynona Dove  A, DO       Semaglutide (1 MG/DOSE) SOPN 1.5 mg  1.5 mg Subcutaneous Q Tue Messick, Wallis Bamberg, MD       traZODone (DESYREL) tablet 50 mg  50 mg Oral QHS Charmaine Downs C, NP   50 mg at 04/06/22 2110   Current Outpatient Medications  Medication Sig Dispense Refill   budesonide-formoterol (SYMBICORT) 160-4.5 MCG/ACT inhaler Inhale 2 puffs into the lungs 2 (two) times daily. (Patient taking differently: Inhale 2 puffs into the lungs 2 (two) times daily as needed (shortness of breath/wheezing).) 10.2 g 2   cyclobenzaprine (FLEXERIL) 10 MG tablet TAKE 1 TO 2 TABLETS BY MOUTH TWICE DAILY AS NEEDED FOR MUSCLE SPASM (Patient taking differently: Take 10 mg by mouth 3 (three) times daily.) 30 tablet 0   diclofenac Sodium (VOLTAREN) 1 % GEL Apply 1 Application topically 3 (three) times daily as needed (pain).     furosemide (LASIX) 40 MG tablet TAKE 1/2 (ONE-HALF) TABLET BY MOUTH ONCE DAILY AS NEEDED (Patient taking differently: Take 20 mg by mouth daily as needed for fluid or edema.) 30 tablet 0   albuterol (ACCUNEB) 1.25 MG/3ML nebulizer solution Take 3 mLs (1.25 mg total) by nebulization every 6 (six) hours as needed. (Patient not taking: Reported on 04/06/2022) 75 mL 1   [START ON 04/08/2022] amLODipine (NORVASC) 5 MG tablet Take 1 tablet (5 mg total) by mouth daily. 30 tablet 0   [START ON 04/08/2022] ARIPiprazole (ABILIFY) 5 MG tablet Take 1 tablet (5 mg total) by  mouth daily. 30 tablet 0   blood glucose meter kit and supplies KIT Dispense based on patient and insurance preference. Use once daily as directed. Dx code E11.9. 1 each 0   Blood Glucose Monitoring Suppl ([place]) w/Device KIT Use glucometer to test sugar. DX: E11.9 1 kit 0   [START ON 04/08/2022] DULoxetine (CYMBALTA) 30 MG capsule Take 1 capsule (30 mg total) by mouth daily. 30 capsule 0   fluticasone (FLONASE) 50 MCG/ACT nasal spray Place 2 sprays into both nostrils daily as needed for up to 14 days for allergies or rhinitis. 15.8 mL 0   gabapentin (NEURONTIN) 400 MG capsule Take 1 capsule (400 mg total) by mouth 3 (three) times daily. 90 capsule 0   glucose blood (ONETOUCH VERIO) test strip Use as instructed 100 each 12   [START ON 04/08/2022] hydrochlorothiazide (HYDRODIURIL) 12.5 MG tablet Take 1 tablet (12.5 mg total) by mouth daily. 30 tablet 0   hydrOXYzine (ATARAX) 25 MG tablet Take 1 tablet (25 mg total) by mouth 3 (three) times daily as needed for anxiety or itching. 30 tablet 0   Lancets Misc. (ACCU-CHEK SOFTCLIX LANCET DEV) KIT Use as instructed to test sugars once daily.  Dx code:E11.9 1 kit 0   mometasone-formoterol (DULERA) 200-5 MCG/ACT AERO Inhale 2 puffs into the lungs 2 (two) times daily. 1 each 0   OneTouch Delica Lancets 55H MISC 1 Units by Does not apply route daily. 100 each 2   [START ON 04/14/2022] Semaglutide, 1 MG/DOSE, 4 MG/3ML SOPN Inject 1.5 mg into the skin every Tuesday for 4 doses. 4.5 mL 0   traZODone (DESYREL) 50 MG tablet Take 1 tablet (50 mg total) by mouth at bedtime for 14 days. 14 tablet 0   PTA Medications: (Not in a hospital admission)   [place] Scale:  [place] ED from 04/04/2022 in [place] DEPT Most recent reading at 04/04/2022 10:25 PM ED from 04/04/2022 in [place]  [place] Most recent reading at 04/04/2022 11:56 AM ED from 11/08/2021 in [place] Most recent reading at 11/08/2021  2:00 PM  C-SSRS RISK CATEGORY No Risk No Risk No Risk       Musculoskeletal: Strength & Muscle Tone: within normal limits Gait & Station: normal Patient leans: Front  Psychiatric Specialty Exam: Presentation  General Appearance: Appropriate for Environment; Fairly Groomed  Eye Contact:Good  Speech:Clear and Coherent; Normal Rate  Speech Volume:Normal  Handedness:Right   Mood and Affect  Mood:-- (Less depressed, reported improved mood)  Affect:Congruent   Thought Process  Thought Processes:Goal Directed; Coherent; Linear  Descriptions of Associations:Intact  Orientation:Full (Time, Place and Person)  Thought Content:Logical  History of Schizophrenia/Schizoaffective disorder:No data recorded Duration of Psychotic Symptoms:No data recorded Hallucinations:Hallucinations: None Description of Auditory Hallucinations: Unable to described what the voices are saying.  Ideas of Reference:None  Suicidal Thoughts:Suicidal Thoughts: No  Homicidal Thoughts:Homicidal Thoughts: No   Sensorium  Memory:Immediate Good; Recent Good; Remote Good  Judgment:Intact  Insight:Good   Executive Functions  Concentration:Good  Attention Span:Good  [place] of Knowledge:Good  Language:Good   Psychomotor Activity  Psychomotor Activity:Psychomotor Activity: Normal   Assets  Assets:Communication Skills; Housing; Desire for Improvement; Social Support   Sleep  Sleep:Sleep: Good    Physical Exam: Physical Exam Vitals and nursing note reviewed.  Constitutional:      Appearance: Normal appearance.  HENT:     Nose: Nose normal.  Cardiovascular:     Rate and Rhythm: Normal rate and regular rhythm.  Pulmonary:     Effort: Pulmonary effort is normal.  Musculoskeletal:        General: Normal range of motion.     Cervical back: Normal range of motion.  Skin:    General: Skin is warm and dry.   Neurological:     General: No focal deficit present.     Mental Status: She is alert and oriented to person, place, and time.    Review of Systems  Constitutional: Negative.   HENT: Negative.    Eyes: Negative.   Respiratory: Negative.    Cardiovascular: Negative.   Skin: Negative.   Neurological: Negative.   Endo/Heme/Allergies: Negative.   Psychiatric/Behavioral:  Positive for depression.    Blood pressure 139/69, pulse 80, temperature (!) 97.4 F (36.3 C), temperature source Oral, resp. rate 18, height 5' (1.524 m), weight 104.3 kg, last menstrual period 12/03/2016, SpO2 100 %. Body mass index is 44.92 kg/m.   Demographic Factors:  Low socioeconomic status, Living alone, Unemployed, and receives social support  Loss Factors: NA  Historical Factors: NA  Risk Reduction Factors:   Positive social support  Continued Clinical Symptoms:  Depression:   Insomnia Alcohol/Substance Abuse/Dependencies More than one psychiatric diagnosis Previous Psychiatric Diagnoses and Treatments  Cognitive Features That Contribute To Risk:  None    Suicide Risk:  Minimal: No identifiable suicidal ideation.  Patients presenting with no risk factors but with morbid ruminations; may be classified as minimal risk based on the severity of the depressive symptoms   Follow-up Information     Go to  [place] DEPT.   Specialty: Emergency Medicine Why: As needed, If symptoms worsen Contact information: [place] [id number] [place], [place]. Schedule an appointment as soon as possible for a visit in 2 days.   Specialty: Family Medicine Why: If you do  not have PCP, please see PCP above, Follow up from ER visit Contact information: [place] [address] [place] [address] [phone]         [place]. Schedule an  appointment as soon as possible for a visit in 2 days.   Specialty: Internal Medicine Why: Follow up from ER visit, If you do not have PCP, please see PCP above Contact information: [place] [address] [phone]                Plan Of Care/Follow-up recommendations:  Activity:  as tolerated Diet:  Diabetic diet  Medical Decision Making: Patient was rounded on this afternoon and she does not require inpatient hospitalization.  Patient reported improved mood, denied seeing or feeling bugs crawling all over her.  She is in agreement to take her medications and engage in outpatient Psychiatric care.  No hx of suicide attempt and she denied SI/HI/AVH this afternoon.  Problem 1: Delusional disorder currently symptomatic    Problem 2: Tactile Hallucination    Problem 3: Bipolar disorder, depressed  Disposition: Discharge  Delfin Gant, NP-PMHNP-BC 04/07/2022, 2:32 PM

## 2022-04-07 NOTE — ED Notes (Signed)
Patient DC d off unit to home per provider. Patient alert, cooperative, calm, no s/s of distress. DC information given to patient, with acknowledged understanding. Belongings given to patient. Patient ambulatory. Patient off unit in w/c., escorted by NT. Patient transported by significant other.

## 2022-04-07 NOTE — ED Provider Notes (Signed)
Emergency Medicine Observation Re-evaluation Note  Elizabeth Diaz is a 56 y.o. female, seen on rounds today.  Pt initially presented to the ED for complaints of Hallucinations Currently, the patient is resting.  Physical Exam  BP (!) 153/96 (BP Location: Right Arm)   Pulse 95   Temp (!) 97.5 F (36.4 C) (Oral)   Resp 18   Ht 5' (1.524 m)   Wt 104.3 kg   LMP 12/03/2016 (Approximate)   SpO2 94%   BMI 44.92 kg/m  Physical Exam General: nad Lungs: no resp distress  ED Course / MDM  EKG:EKG Interpretation  Date/Time:  Saturday April 04 2022 23:14:21 EDT Ventricular Rate:  102 PR Interval:  114 QRS Duration: 67 QT Interval:  375 QTC Calculation: 489 R Axis:   30 Text Interpretation: Sinus tachycardia Paired ventricular premature complexes Aberrant conduction of SV complex(es) Right atrial enlargement Low voltage, precordial leads Abnormal R-wave progression, early transition Borderline T abnormalities, anterior leads Borderline prolonged QT interval Confirmed by Ripley Fraise [phone]) on 04/05/2022 9:14:12 AM  I have reviewed the labs performed to date as well as medications administered while in observation.  Recent changes in the last 24 hours include no acute events o/n. Home medications reviewed   Plan  Current plan is for placement.  SHAWN DANNENBERG is not under involuntary commitment.     Jeanell Sparrow, DO 04/07/22 [phone]

## 2022-04-07 NOTE — Progress Notes (Signed)
Inpatient Behavioral Health Placement  Pt meets inpatient criteria per Delfin Gant, NP- There are no available beds at [facility] per [facility] Wynonia Hazard, RN.  Referral was sent to the following facilities;   Destination Service Provider Address Phone Fax  [facility]  [address], [place] [address] [phone]  [place] ., [place] [address] [phone]  [facility]  [address] [place] [address] [place]  [facility]  [address], [place] [address] [phone]  [facility]  [place], [place] [address] [phone]  [facility]  [place] [address] [place] [address] [place]  [facility]  [address] [place] [address] [phone]  [facility]  [address], [place] [address] [phone]  [facility]  [address] [place] [address] [phone] [place]  [address], [place] [address] [phone] [place]  [address] [place] [address] [phone] [facility]  [address] [place] [address] [phone]  [facility]  [address] [place] [address] [phone] [facility]  [address] [place] [address] [phone]  [facility]  [address] [place] [address] [phone] [place]  [address] [place] [address] [phone]  [place] [address] [phone]  [facility]  [place], [place] [address] [phone]  [facility]  [address], [place] [address] [phone] [facility]  [place], [place] [address] [phone] [facility]  [address] [place] [address] [phone] [facility]  [place] [address] [phone]  [facility]  [address] [place] [address] [phone]    Situation ongoing,  CSW will follow up.   Benjaman Kindler, MSW, LCSWA 04/07/2022  @ 12:28 AM

## 2022-04-07 NOTE — ED Notes (Signed)
Pt showered and now back in room, eating her lunch.

## 2022-04-07 NOTE — ED Notes (Signed)
Pt's breakfast tray has arrived, writer had set it on pt's bedside table. Pt awoken from sleep and made aware of pt's breakfast tray was in the room, pt stated to just "leave it there". Pt went back to sleep

## 2022-04-07 NOTE — ED Notes (Signed)
Pt's fiance, Ray, is visiting pt

## 2022-04-07 NOTE — Discharge Instructions (Addendum)
Please follow-up with primary care doctor regarding elevated blood pressure reading  For your behavioral health needs you are advised to follow up with an outpatient provider.  The providers listed below all offer psychiatry and therapy.  Contact them at your earliest opportunity to ask about scheduling a new patient appointment:       [place]      [address] [place], [place] [address]      [phone]        [facility] [place] [facility]      [address], [place] [address]      [place], [place] [address]      [phone]        [place]      [employer] [address] [place], [place] [address]      [phone]

## 2022-04-07 NOTE — BH Assessment (Signed)
[facility] Assessment Progress Note   Per Charmaine Downs, NP, this voluntary pt does not require psychiatric hospitalization at this time.  Pt is psychiatrically cleared.  Discharge instructions include referrals for outpatient psychiatry and therapy.  EDP Wynona Dove, DO and pt's nurse, Eustaquio Maize, have been notified.  Jalene Mullet, [place] Triage Specialist [phone]

## 2022-04-20 ENCOUNTER — Encounter: Payer: Self-pay | Admitting: Family Medicine

## 2022-04-20 ENCOUNTER — Ambulatory Visit (INDEPENDENT_AMBULATORY_CARE_PROVIDER_SITE_OTHER): Payer: Medicare Other | Admitting: Family Medicine

## 2022-04-20 VITALS — BP 132/77 | HR 79 | Ht 60.0 in | Wt 230.8 lb

## 2022-04-20 DIAGNOSIS — G8929 Other chronic pain: Secondary | ICD-10-CM

## 2022-04-20 DIAGNOSIS — M542 Cervicalgia: Secondary | ICD-10-CM | POA: Diagnosis not present

## 2022-04-20 DIAGNOSIS — G4733 Obstructive sleep apnea (adult) (pediatric): Secondary | ICD-10-CM

## 2022-04-20 DIAGNOSIS — R3981 Functional urinary incontinence: Secondary | ICD-10-CM | POA: Diagnosis not present

## 2022-04-20 DIAGNOSIS — E119 Type 2 diabetes mellitus without complications: Secondary | ICD-10-CM

## 2022-04-20 DIAGNOSIS — M549 Dorsalgia, unspecified: Secondary | ICD-10-CM | POA: Diagnosis not present

## 2022-04-20 DIAGNOSIS — F22 Delusional disorders: Secondary | ICD-10-CM

## 2022-04-20 DIAGNOSIS — R600 Localized edema: Secondary | ICD-10-CM | POA: Diagnosis not present

## 2022-04-20 DIAGNOSIS — M25512 Pain in left shoulder: Secondary | ICD-10-CM

## 2022-04-20 DIAGNOSIS — M75122 Complete rotator cuff tear or rupture of left shoulder, not specified as traumatic: Secondary | ICD-10-CM

## 2022-04-20 DIAGNOSIS — J449 Chronic obstructive pulmonary disease, unspecified: Secondary | ICD-10-CM | POA: Diagnosis not present

## 2022-04-20 LAB — POCT GLYCOSYLATED HEMOGLOBIN (HGB A1C): HbA1c, POC (controlled diabetic range): 5.6 % (ref 0.0–7.0)

## 2022-04-20 MED ORDER — HYDROCHLOROTHIAZIDE 12.5 MG PO TABS: 12.5000 mg | ORAL_TABLET | Freq: Every day | ORAL | 2 refills | Status: AC

## 2022-04-20 MED ORDER — SEMAGLUTIDE (1 MG/DOSE) 4 MG/3ML ~~LOC~~ SOPN: 1.5000 mg | PEN_INJECTOR | SUBCUTANEOUS | 2 refills | Status: DC

## 2022-04-20 MED ORDER — FLUTICASONE PROPIONATE 50 MCG/ACT NA SUSP: 2.0000 | Freq: Every day | NASAL | 2 refills | Status: AC | PRN

## 2022-04-20 MED ORDER — HYDROXYZINE HCL 25 MG PO TABS
25.0000 mg | ORAL_TABLET | Freq: Three times a day (TID) | ORAL | 2 refills | Status: DC | PRN
Start: 2022-04-20 — End: 2022-05-15

## 2022-04-20 MED ORDER — CYCLOBENZAPRINE HCL 10 MG PO TABS: ORAL_TABLET | ORAL | 2 refills | Status: DC

## 2022-04-20 MED ORDER — ARIPIPRAZOLE 5 MG PO TABS: 5.0000 mg | ORAL_TABLET | Freq: Every day | ORAL | 2 refills | Status: DC

## 2022-04-20 MED ORDER — DICLOFENAC SODIUM 1 % EX GEL: 1.0000 | Freq: Three times a day (TID) | CUTANEOUS | 4 refills | Status: AC | PRN

## 2022-04-20 MED ORDER — BUDESONIDE-FORMOTEROL FUMARATE 160-4.5 MCG/ACT IN AERO: 2.0000 | INHALATION_SPRAY | Freq: Two times a day (BID) | RESPIRATORY_TRACT | 2 refills | Status: AC

## 2022-04-20 MED ORDER — DULOXETINE HCL 30 MG PO CPEP: 30.0000 mg | ORAL_CAPSULE | Freq: Every day | ORAL | 2 refills | Status: DC

## 2022-04-20 MED ORDER — AMLODIPINE BESYLATE 5 MG PO TABS: 5.0000 mg | ORAL_TABLET | Freq: Every day | ORAL | 2 refills | Status: AC

## 2022-04-20 MED ORDER — ALBUTEROL SULFATE 1.25 MG/3ML IN NEBU: 1.0000 | INHALATION_SOLUTION | Freq: Four times a day (QID) | RESPIRATORY_TRACT | 2 refills | Status: AC | PRN

## 2022-04-20 MED ORDER — GABAPENTIN 400 MG PO CAPS: 400.0000 mg | ORAL_CAPSULE | Freq: Three times a day (TID) | ORAL | 2 refills | Status: AC

## 2022-04-20 NOTE — Patient Instructions (Signed)
It was wonderful to see you today. Thank you for allowing me to be a part of your care. Below is a short summary of what we discussed at your visit today:  Medications I have sent refills of all your medications to the pharmacy.  This way do not run out of anything.  Testing today Today we obtained an A1c and a urine sample.  The results will be available on your MyChart. If the results are normal, I will send you a letter or MyChart message. If the results are abnormal, I will give you a call.    Clinic dismissal Please see the letter attached to your paperwork regarding your dismissal from clinic in our no-show policy.  If you have any questions or concerns, please call our office and speak with Christen Bame or Dr. Gwendlyn Deutscher.  [facility] Available as walk-in brain health care 24/7 [place], [place], [place] [address]  [email or web address]      Please bring all of your medications to every appointment!  If you have any questions or concerns, please do not hesitate to contact us via phone or MyChart message.   Ezequiel Essex, MD

## 2022-04-20 NOTE — Progress Notes (Signed)
SUBJECTIVE:   CHIEF COMPLAINT / HPI:   Ms. Stracener is a pleasant 56 year old woman who presents for hospital follow-up.  She has multiple complaints, however we are limited in our time today, so she will make follow-up appointments to discuss the rest.  Follow-up on behavioral health hospitalization Patient was recently hospitalized in early July after presenting to the ED with complaint of formication.  She was worked up for medical condition, found to have stable vitals and a UDS that was positive for cocaine and THC.  She was then transferred to behavioral health Hospital for treatment of presumed delusional disorder.  Patient reports to me that she inhaled cocaine and afterwards is when all of her symptoms started.  In our clinic visit today, she has multiple scars on her arms, both pick marks and linear scars.  All are healing well.  She reports that the pick marks are from where she used her fingers to try to get the bug sensation out.  She says the linear scars are where she took a knife in multiple attempts to rid herself of the tactile hallucination.  She denies SI or HI at the time this occurred.  None of the scars appear to be in a pattern suggestive of self-harm with the goal of suicide.  Clinic dismissal Patient was mailed a warning letter in February regarding her number of no-shows to appointments and our policy regarding these.  She was then subsequently sent a clinic dismissal letter in May via certified mail.  There was an error at the front desk where she continued to be scheduled like normal at the front desk.  Since her dismissal letter May 11th, she has attended one appointment and no showed two others.  In our appointment today, I asked her about these letters.  She reports she has been out of town and did not receive these in the mail.  We discussed the nature of our no-show policy and that she has been officially dismissed from our clinic.  I assured her that we we will  continue to see her for any medical needs for the next 30 days while she finds a new PCP.  I also assured her that I will provide refills of all prescriptions before the end of the 30 days to ensure she did not run out of anything prior to obtaining a new PCP.  Other requests Urine test - collected today A1c - collected today STI testing w/ vaginal swab - patient will schedule follow up   PERTINENT  PMH / PSH: Diabetic polyneuropathy, bipolar affective disorder, newly diagnosed delusional disorder, substance use  OBJECTIVE:   BP 132/77   Pulse 79   Ht 5' (1.524 m)   Wt 230 lb 12.8 oz (104.7 kg)   LMP 12/03/2016 (Approximate)   SpO2 99%   BMI 45.08 kg/m   PHQ-9:     04/20/2022    3:34 PM 03/26/2022    1:34 PM 01/15/2022   11:25 AM  Depression screen PHQ 2/9  Decreased Interest '1 3 3  '$ Down, Depressed, Hopeless '2 2 2  '$ PHQ - 2 Score '3 5 5  '$ Altered sleeping '3 3 3  '$ Tired, decreased energy '2 3 3  '$ Change in appetite 0 0 0  Feeling bad or failure about yourself  1 0 0  Trouble concentrating 0 3 3  Moving slowly or fidgety/restless 0 0 0  Suicidal thoughts 1 0 0  PHQ-9 Score '10 14 14  '$ Difficult doing work/chores Somewhat  difficult     Physical Exam General: Awake, alert, oriented, no acute distress Respiratory: Unlabored respirations, speaking in full sentences, no respiratory distress Extremities: Moving all extremities spontaneously, well-healed linear scars over forearms  ASSESSMENT/PLAN:   Delusional disorder currently symptomatic (HCC) Resolved.  Scars on arms are well healing, no signs of infection.  PHQ-9 question #9 score of 1, discussed with patient.  She denies any current plans. Her partner is a protective factor.  She believes it is a side effect of a medication she is on, but does not know which one.  Type 2 diabetes mellitus without complication, without long-term current use of insulin (HCC) Checking A1c today.     Ezequiel Essex, MD [place]

## 2022-04-22 NOTE — Assessment & Plan Note (Signed)
Checking A1c today.

## 2022-04-22 NOTE — Assessment & Plan Note (Signed)
Resolved.  Scars on arms are well healing, no signs of infection.  PHQ-9 question #9 score of 1, discussed with patient.  She denies any current plans. Her partner is a protective factor.  She believes it is a side effect of a medication she is on, but does not know which one.

## 2022-04-23 ENCOUNTER — Telehealth: Payer: Self-pay | Admitting: *Deleted

## 2022-04-23 DIAGNOSIS — E119 Type 2 diabetes mellitus without complications: Secondary | ICD-10-CM

## 2022-04-23 NOTE — Telephone Encounter (Signed)
Received fax about pts ozempic Rx.  The message on fax is-   None of the Ozempic pens give 1.'5mg'$  dose.  The one ordered gives '1mg'$  or you can change to Ozempic '8mg'$ 30m to give '2mg'$ .    Please send in the appropriate Rx for this pts ozempic. I have placed the fax in your box for reference if you need it.[place] Merida Zimmerman Rumple, CMA

## 2022-04-24 ENCOUNTER — Other Ambulatory Visit: Payer: Self-pay | Admitting: Family Medicine

## 2022-04-24 DIAGNOSIS — E119 Type 2 diabetes mellitus without complications: Secondary | ICD-10-CM

## 2022-04-24 MED ORDER — OZEMPIC (2 MG/DOSE) 8 MG/3ML ~~LOC~~ SOPN: 2.0000 mg | PEN_INJECTOR | SUBCUTANEOUS | 2 refills | Status: DC

## 2022-04-24 MED ORDER — SEMAGLUTIDE (1 MG/DOSE) 4 MG/3ML ~~LOC~~ SOPN: 2.0000 mg | PEN_INJECTOR | SUBCUTANEOUS | 2 refills | Status: DC

## 2022-04-24 NOTE — Addendum Note (Signed)
Addended by: Renard Hamper on: 04/24/2022 02:14 PM   Modules accepted: Orders

## 2022-04-24 NOTE — Telephone Encounter (Signed)
Corrected prescription.  Needs to be 2 mg per dose.  Sent in prescription to pharmacy.  Ezequiel Essex, MD

## 2022-05-07 ENCOUNTER — Ambulatory Visit: Payer: Medicare Other | Admitting: Family Medicine

## 2022-05-09 ENCOUNTER — Other Ambulatory Visit: Payer: Self-pay | Admitting: Family Medicine

## 2022-05-09 DIAGNOSIS — G8929 Other chronic pain: Secondary | ICD-10-CM

## 2022-05-13 ENCOUNTER — Ambulatory Visit: Payer: Medicare Other | Admitting: Family Medicine

## 2022-05-14 ENCOUNTER — Ambulatory Visit: Payer: Medicare Other

## 2022-05-15 ENCOUNTER — Ambulatory Visit (INDEPENDENT_AMBULATORY_CARE_PROVIDER_SITE_OTHER): Payer: Medicare Other | Admitting: Family Medicine

## 2022-05-15 ENCOUNTER — Encounter: Payer: Self-pay | Admitting: Family Medicine

## 2022-05-15 ENCOUNTER — Other Ambulatory Visit (HOSPITAL_COMMUNITY)
Admission: RE | Admit: 2022-05-15 | Discharge: 2022-05-15 | Disposition: A | Discharge status: Home / Self Care | Payer: Medicare Other | Source: Ambulatory Visit | Attending: Family Medicine | Admitting: Family Medicine

## 2022-05-15 VITALS — BP 130/63 | HR 96 | Wt 226.4 lb

## 2022-05-15 DIAGNOSIS — E119 Type 2 diabetes mellitus without complications: Secondary | ICD-10-CM | POA: Diagnosis not present

## 2022-05-15 DIAGNOSIS — M25512 Pain in left shoulder: Secondary | ICD-10-CM | POA: Diagnosis not present

## 2022-05-15 DIAGNOSIS — M542 Cervicalgia: Secondary | ICD-10-CM

## 2022-05-15 DIAGNOSIS — R3981 Functional urinary incontinence: Secondary | ICD-10-CM | POA: Diagnosis not present

## 2022-05-15 DIAGNOSIS — Z7251 High risk heterosexual behavior: Secondary | ICD-10-CM | POA: Diagnosis present

## 2022-05-15 DIAGNOSIS — M549 Dorsalgia, unspecified: Secondary | ICD-10-CM | POA: Diagnosis not present

## 2022-05-15 DIAGNOSIS — M75122 Complete rotator cuff tear or rupture of left shoulder, not specified as traumatic: Secondary | ICD-10-CM

## 2022-05-15 DIAGNOSIS — G8929 Other chronic pain: Secondary | ICD-10-CM

## 2022-05-15 LAB — POCT URINALYSIS DIP (MANUAL ENTRY)
Bilirubin, UA: NEGATIVE
Blood, UA: NEGATIVE
Glucose, UA: NEGATIVE mg/dL
Ketones, POC UA: NEGATIVE mg/dL
Nitrite, UA: NEGATIVE
Protein Ur, POC: NEGATIVE mg/dL
Spec Grav, UA: 1.02 (ref 1.010–1.025)
Urobilinogen, UA: 1 E.U./dL
pH, UA: 5.5 (ref 5.0–8.0)

## 2022-05-15 LAB — POCT UA - MICROSCOPIC ONLY

## 2022-05-15 MED ORDER — OZEMPIC (2 MG/DOSE) 8 MG/3ML ~~LOC~~ SOPN: 2.0000 mg | PEN_INJECTOR | SUBCUTANEOUS | 2 refills | Status: AC

## 2022-05-15 MED ORDER — DULOXETINE HCL 30 MG PO CPEP: 30.0000 mg | ORAL_CAPSULE | Freq: Every day | ORAL | 0 refills | Status: AC

## 2022-05-15 MED ORDER — ARIPIPRAZOLE 5 MG PO TABS
5.0000 mg | ORAL_TABLET | Freq: Every day | ORAL | 0 refills | Status: AC
Start: 2022-05-15 — End: 2024-07-31

## 2022-05-15 MED ORDER — CYCLOBENZAPRINE HCL 10 MG PO TABS: ORAL_TABLET | ORAL | 2 refills | Status: AC

## 2022-05-15 MED ORDER — HYDROXYZINE HCL 25 MG PO TABS: 25.0000 mg | ORAL_TABLET | Freq: Three times a day (TID) | ORAL | 2 refills | Status: AC | PRN

## 2022-05-15 NOTE — Progress Notes (Signed)
    SUBJECTIVE:   CHIEF COMPLAINT / HPI:   Patient presents for STI check. Had intercourse with a new partner who has other sexual partners. No vaginal symptoms today. Endorses chronic urinary incontinence for the past few years  States she has chronic pain and would like referral to pain specialist   DM2: A1c recently checked and was 5.6 about a month ago States she has been eating a lot of candy. Also protein drinks, water, daily greens. States she drinks her foods because   Refills abilify, flexeril, duloxetine    PERTINENT  PMH / PSH: Reviewed   OBJECTIVE:   BP 130/63   Pulse 96   Wt 226 lb 6.4 oz (102.7 kg)   LMP 12/03/2016 (Approximate)   SpO2 98%   BMI 44.22 kg/m    Physical exam General: well appearing, NAD Cardiovascular: RRR, no murmurs Lungs: CTAB. Normal WOB Abdomen: soft, non-distended, non-tender Skin: warm, dry. No edema Pelvic exam: normal external genitalia, vulva, vagina, cervix  ASSESSMENT/PLAN:   No problem-specific Assessment & Plan notes found for this encounter.   Unprotected intercourse New partner. No vaginal symptoms but requests STI testing. UA was obtained prior to me seeing patient. No new vaginal symptoms just her chronic urinary incontinence. Pelvic exam unremarkable. Will check GC, trichomonas, BV, yeast. Given lab was closed today, placed future order for patient to get HIV, RPR, Hep B on Monday. Could not schedule appointment due to patient's dismissal from clinic.   Chronic pain Referred to pain clinic per patient's request   Misc Patient has been dismissed from clinic but we are still in the 30 day time frame to continue treating her. She states she has not yet found a replacement provider. I have refilled the medications she requested: abilify, flexeril, duloxetine.  I declined her request to increase dosages given that we would not be able to follow-up after the change. Patient expressed understanding  Elizabeth Diaz

## 2022-05-15 NOTE — Patient Instructions (Signed)
It was great seeing you today!  Today we checked for STIs and I will call you if anything is abnormal, or will send a mychart message if normal. I have also refilled several of your medications   Visit Reminders: - Stop by the pharmacy to pick up your prescriptions  - Continue to work on your healthy eating habits and incorporating exercise into your daily life.   Feel free to call with any questions or concerns at any time, at [phone].   Take care,  Dr. Shary Key [facility]

## 2022-05-18 ENCOUNTER — Telehealth: Payer: Self-pay | Admitting: Family Medicine

## 2022-05-18 NOTE — Telephone Encounter (Signed)
Patient came in needs additional information to be faxed to [facility].  Information that is needed is prescription with ICD.10 DX code and also progress notes stating why it is needed and what medication will be dispensed.  Any questions please call patient.  Also paperwork already completed with Drs. Send along with her additional information that doctor will provide.  So she can get what she needs.

## 2022-05-19 LAB — CERVICOVAGINAL ANCILLARY ONLY
Bacterial Vaginitis (gardnerella): POSITIVE — AB
Candida Glabrata: NEGATIVE
Candida Vaginitis: NEGATIVE
Chlamydia: NEGATIVE
Comment: NEGATIVE
Comment: NEGATIVE
Comment: NEGATIVE
Comment: NEGATIVE
Comment: NEGATIVE
Comment: NORMAL
Neisseria Gonorrhea: NEGATIVE
Trichomonas: NEGATIVE

## 2022-05-19 NOTE — Telephone Encounter (Signed)
THere is also a form in your box for release of the notes for office visit on 7/24,2023. I have printed them and attached to the form.  Also you will need a faculty to signe the order since patient has medicare.  Tr[place] H[place] Zimmerman Rumple, CMA

## 2022-05-20 ENCOUNTER — Other Ambulatory Visit: Payer: Self-pay | Admitting: Family Medicine

## 2022-05-20 MED ORDER — METRONIDAZOLE 500 MG PO TABS: 500.0000 mg | ORAL_TABLET | Freq: Two times a day (BID) | ORAL | 0 refills | Status: AC

## 2022-05-25 NOTE — Telephone Encounter (Signed)
Form faxed to [facility].

## 2022-06-05 NOTE — Telephone Encounter (Signed)
CAP form placed up front for pick up.   Copy made for batch scanning.

## 2022-06-12 DIAGNOSIS — Z1159 Encounter for screening for other viral diseases: Secondary | ICD-10-CM | POA: Diagnosis not present

## 2022-06-12 DIAGNOSIS — Z131 Encounter for screening for diabetes mellitus: Secondary | ICD-10-CM | POA: Diagnosis not present

## 2022-06-12 DIAGNOSIS — I10 Essential (primary) hypertension: Secondary | ICD-10-CM | POA: Diagnosis not present

## 2022-06-12 DIAGNOSIS — M25562 Pain in left knee: Secondary | ICD-10-CM | POA: Diagnosis not present

## 2022-06-12 DIAGNOSIS — Z0000 Encounter for general adult medical examination without abnormal findings: Secondary | ICD-10-CM | POA: Diagnosis not present

## 2022-06-12 DIAGNOSIS — M545 Low back pain, unspecified: Secondary | ICD-10-CM | POA: Diagnosis not present

## 2022-06-12 DIAGNOSIS — G8929 Other chronic pain: Secondary | ICD-10-CM | POA: Diagnosis not present

## 2022-06-12 DIAGNOSIS — M25561 Pain in right knee: Secondary | ICD-10-CM | POA: Diagnosis not present

## 2022-06-12 DIAGNOSIS — R5383 Other fatigue: Secondary | ICD-10-CM | POA: Diagnosis not present

## 2022-06-12 DIAGNOSIS — M542 Cervicalgia: Secondary | ICD-10-CM | POA: Diagnosis not present

## 2022-06-12 DIAGNOSIS — Z79899 Other long term (current) drug therapy: Secondary | ICD-10-CM | POA: Diagnosis not present

## 2022-06-12 DIAGNOSIS — M25512 Pain in left shoulder: Secondary | ICD-10-CM | POA: Diagnosis not present

## 2022-06-12 DIAGNOSIS — R030 Elevated blood-pressure reading, without diagnosis of hypertension: Secondary | ICD-10-CM | POA: Diagnosis not present

## 2022-06-12 DIAGNOSIS — M129 Arthropathy, unspecified: Secondary | ICD-10-CM | POA: Diagnosis not present

## 2022-07-03 ENCOUNTER — Other Ambulatory Visit: Payer: Self-pay | Admitting: Family Medicine

## 2022-07-03 DIAGNOSIS — G8929 Other chronic pain: Secondary | ICD-10-CM

## 2022-07-15 DIAGNOSIS — I70219 Atherosclerosis of native arteries of extremities with intermittent claudication, unspecified extremity: Secondary | ICD-10-CM | POA: Diagnosis not present

## 2022-07-15 DIAGNOSIS — Z23 Encounter for immunization: Secondary | ICD-10-CM | POA: Diagnosis not present

## 2022-07-15 DIAGNOSIS — I10 Essential (primary) hypertension: Secondary | ICD-10-CM | POA: Diagnosis not present

## 2022-07-15 DIAGNOSIS — I5032 Chronic diastolic (congestive) heart failure: Secondary | ICD-10-CM | POA: Diagnosis not present

## 2022-07-15 DIAGNOSIS — E785 Hyperlipidemia, unspecified: Secondary | ICD-10-CM | POA: Diagnosis not present

## 2022-07-15 DIAGNOSIS — I739 Peripheral vascular disease, unspecified: Secondary | ICD-10-CM | POA: Diagnosis not present

## 2022-07-15 DIAGNOSIS — F17210 Nicotine dependence, cigarettes, uncomplicated: Secondary | ICD-10-CM | POA: Diagnosis not present

## 2022-07-15 DIAGNOSIS — I110 Hypertensive heart disease with heart failure: Secondary | ICD-10-CM | POA: Diagnosis not present

## 2022-07-15 DIAGNOSIS — Z720 Tobacco use: Secondary | ICD-10-CM | POA: Diagnosis not present

## 2022-07-17 DIAGNOSIS — E119 Type 2 diabetes mellitus without complications: Secondary | ICD-10-CM | POA: Diagnosis not present

## 2022-07-17 DIAGNOSIS — R030 Elevated blood-pressure reading, without diagnosis of hypertension: Secondary | ICD-10-CM | POA: Diagnosis not present

## 2022-07-17 DIAGNOSIS — M545 Low back pain, unspecified: Secondary | ICD-10-CM | POA: Diagnosis not present

## 2022-07-17 DIAGNOSIS — K2270 Barrett's esophagus without dysplasia: Secondary | ICD-10-CM | POA: Diagnosis not present

## 2022-07-17 DIAGNOSIS — Z79899 Other long term (current) drug therapy: Secondary | ICD-10-CM | POA: Diagnosis not present

## 2022-07-21 DIAGNOSIS — Z79899 Other long term (current) drug therapy: Secondary | ICD-10-CM | POA: Diagnosis not present

## 2022-08-17 DIAGNOSIS — I10 Essential (primary) hypertension: Secondary | ICD-10-CM | POA: Diagnosis not present

## 2022-08-17 DIAGNOSIS — E119 Type 2 diabetes mellitus without complications: Secondary | ICD-10-CM | POA: Diagnosis not present

## 2022-08-17 DIAGNOSIS — Z79899 Other long term (current) drug therapy: Secondary | ICD-10-CM | POA: Diagnosis not present

## 2022-08-17 DIAGNOSIS — G8929 Other chronic pain: Secondary | ICD-10-CM | POA: Diagnosis not present

## 2022-08-17 DIAGNOSIS — M545 Low back pain, unspecified: Secondary | ICD-10-CM | POA: Diagnosis not present

## 2022-08-19 DIAGNOSIS — Z79899 Other long term (current) drug therapy: Secondary | ICD-10-CM | POA: Diagnosis not present

## 2022-08-26 DIAGNOSIS — E119 Type 2 diabetes mellitus without complications: Secondary | ICD-10-CM | POA: Diagnosis not present

## 2022-08-26 DIAGNOSIS — M62830 Muscle spasm of back: Secondary | ICD-10-CM | POA: Diagnosis not present

## 2022-08-26 DIAGNOSIS — E7800 Pure hypercholesterolemia, unspecified: Secondary | ICD-10-CM | POA: Diagnosis not present

## 2022-08-26 DIAGNOSIS — I10 Essential (primary) hypertension: Secondary | ICD-10-CM | POA: Diagnosis not present

## 2022-08-26 DIAGNOSIS — Z79899 Other long term (current) drug therapy: Secondary | ICD-10-CM | POA: Diagnosis not present

## 2022-08-26 DIAGNOSIS — R5383 Other fatigue: Secondary | ICD-10-CM | POA: Diagnosis not present

## 2022-08-26 DIAGNOSIS — E559 Vitamin D deficiency, unspecified: Secondary | ICD-10-CM | POA: Diagnosis not present

## 2022-08-26 DIAGNOSIS — M792 Neuralgia and neuritis, unspecified: Secondary | ICD-10-CM | POA: Diagnosis not present

## 2022-08-26 DIAGNOSIS — R0602 Shortness of breath: Secondary | ICD-10-CM | POA: Diagnosis not present

## 2022-08-26 DIAGNOSIS — G4730 Sleep apnea, unspecified: Secondary | ICD-10-CM | POA: Diagnosis not present

## 2022-09-16 DIAGNOSIS — M545 Low back pain, unspecified: Secondary | ICD-10-CM | POA: Diagnosis not present

## 2022-09-16 DIAGNOSIS — E119 Type 2 diabetes mellitus without complications: Secondary | ICD-10-CM | POA: Diagnosis not present

## 2022-09-16 DIAGNOSIS — Z79899 Other long term (current) drug therapy: Secondary | ICD-10-CM | POA: Diagnosis not present

## 2022-09-16 DIAGNOSIS — G8929 Other chronic pain: Secondary | ICD-10-CM | POA: Diagnosis not present

## 2022-09-16 DIAGNOSIS — I10 Essential (primary) hypertension: Secondary | ICD-10-CM | POA: Diagnosis not present

## 2022-09-22 DIAGNOSIS — Z79899 Other long term (current) drug therapy: Secondary | ICD-10-CM | POA: Diagnosis not present

## 2022-10-16 DIAGNOSIS — I10 Essential (primary) hypertension: Secondary | ICD-10-CM | POA: Diagnosis not present

## 2022-10-16 DIAGNOSIS — Z0000 Encounter for general adult medical examination without abnormal findings: Secondary | ICD-10-CM | POA: Diagnosis not present

## 2022-10-16 DIAGNOSIS — G4730 Sleep apnea, unspecified: Secondary | ICD-10-CM | POA: Diagnosis not present

## 2022-10-16 DIAGNOSIS — G8929 Other chronic pain: Secondary | ICD-10-CM | POA: Diagnosis not present

## 2022-10-16 DIAGNOSIS — Z79899 Other long term (current) drug therapy: Secondary | ICD-10-CM | POA: Diagnosis not present

## 2022-10-16 DIAGNOSIS — M792 Neuralgia and neuritis, unspecified: Secondary | ICD-10-CM | POA: Diagnosis not present

## 2022-10-16 DIAGNOSIS — E119 Type 2 diabetes mellitus without complications: Secondary | ICD-10-CM | POA: Diagnosis not present

## 2022-10-16 DIAGNOSIS — M545 Low back pain, unspecified: Secondary | ICD-10-CM | POA: Diagnosis not present

## 2022-10-16 DIAGNOSIS — M62830 Muscle spasm of back: Secondary | ICD-10-CM | POA: Diagnosis not present

## 2022-10-20 DIAGNOSIS — Z79899 Other long term (current) drug therapy: Secondary | ICD-10-CM | POA: Diagnosis not present

## 2022-11-12 DIAGNOSIS — I10 Essential (primary) hypertension: Secondary | ICD-10-CM | POA: Diagnosis not present

## 2022-11-12 DIAGNOSIS — Z79899 Other long term (current) drug therapy: Secondary | ICD-10-CM | POA: Diagnosis not present

## 2022-11-12 DIAGNOSIS — G8929 Other chronic pain: Secondary | ICD-10-CM | POA: Diagnosis not present

## 2022-11-12 DIAGNOSIS — M545 Low back pain, unspecified: Secondary | ICD-10-CM | POA: Diagnosis not present

## 2022-11-12 DIAGNOSIS — G4730 Sleep apnea, unspecified: Secondary | ICD-10-CM | POA: Diagnosis not present

## 2022-11-12 DIAGNOSIS — M62830 Muscle spasm of back: Secondary | ICD-10-CM | POA: Diagnosis not present

## 2022-11-12 DIAGNOSIS — E119 Type 2 diabetes mellitus without complications: Secondary | ICD-10-CM | POA: Diagnosis not present

## 2022-11-16 DIAGNOSIS — Z79899 Other long term (current) drug therapy: Secondary | ICD-10-CM | POA: Diagnosis not present

## 2022-12-04 DIAGNOSIS — I509 Heart failure, unspecified: Secondary | ICD-10-CM | POA: Diagnosis not present

## 2022-12-04 DIAGNOSIS — J9811 Atelectasis: Secondary | ICD-10-CM | POA: Diagnosis not present

## 2022-12-04 DIAGNOSIS — Z8639 Personal history of other endocrine, nutritional and metabolic disease: Secondary | ICD-10-CM | POA: Diagnosis not present

## 2022-12-04 DIAGNOSIS — E119 Type 2 diabetes mellitus without complications: Secondary | ICD-10-CM | POA: Diagnosis not present

## 2022-12-04 DIAGNOSIS — R0789 Other chest pain: Secondary | ICD-10-CM | POA: Diagnosis not present

## 2022-12-04 DIAGNOSIS — R918 Other nonspecific abnormal finding of lung field: Secondary | ICD-10-CM | POA: Diagnosis not present

## 2022-12-04 DIAGNOSIS — G44209 Tension-type headache, unspecified, not intractable: Secondary | ICD-10-CM | POA: Diagnosis not present

## 2022-12-04 DIAGNOSIS — K219 Gastro-esophageal reflux disease without esophagitis: Secondary | ICD-10-CM | POA: Diagnosis not present

## 2022-12-04 DIAGNOSIS — Z77118 Contact with and (suspected) exposure to other environmental pollution: Secondary | ICD-10-CM | POA: Diagnosis not present

## 2022-12-04 DIAGNOSIS — R600 Localized edema: Secondary | ICD-10-CM | POA: Diagnosis not present

## 2022-12-17 DIAGNOSIS — I70211 Atherosclerosis of native arteries of extremities with intermittent claudication, right leg: Secondary | ICD-10-CM | POA: Diagnosis not present

## 2022-12-17 DIAGNOSIS — I70219 Atherosclerosis of native arteries of extremities with intermittent claudication, unspecified extremity: Secondary | ICD-10-CM | POA: Diagnosis not present

## 2022-12-18 DIAGNOSIS — Z79899 Other long term (current) drug therapy: Secondary | ICD-10-CM | POA: Diagnosis not present

## 2022-12-18 DIAGNOSIS — G8929 Other chronic pain: Secondary | ICD-10-CM | POA: Diagnosis not present

## 2022-12-18 DIAGNOSIS — R030 Elevated blood-pressure reading, without diagnosis of hypertension: Secondary | ICD-10-CM | POA: Diagnosis not present

## 2022-12-18 DIAGNOSIS — M62830 Muscle spasm of back: Secondary | ICD-10-CM | POA: Diagnosis not present

## 2022-12-18 DIAGNOSIS — M545 Low back pain, unspecified: Secondary | ICD-10-CM | POA: Diagnosis not present

## 2022-12-22 DIAGNOSIS — Z79899 Other long term (current) drug therapy: Secondary | ICD-10-CM | POA: Diagnosis not present

## 2023-01-14 DIAGNOSIS — E785 Hyperlipidemia, unspecified: Secondary | ICD-10-CM | POA: Diagnosis not present

## 2023-01-14 DIAGNOSIS — I739 Peripheral vascular disease, unspecified: Secondary | ICD-10-CM | POA: Diagnosis not present

## 2023-01-14 DIAGNOSIS — E119 Type 2 diabetes mellitus without complications: Secondary | ICD-10-CM | POA: Diagnosis not present

## 2023-01-14 DIAGNOSIS — Z720 Tobacco use: Secondary | ICD-10-CM | POA: Diagnosis not present

## 2023-01-14 DIAGNOSIS — I5032 Chronic diastolic (congestive) heart failure: Secondary | ICD-10-CM | POA: Diagnosis not present

## 2023-01-14 DIAGNOSIS — I110 Hypertensive heart disease with heart failure: Secondary | ICD-10-CM | POA: Diagnosis not present

## 2023-01-18 DIAGNOSIS — E119 Type 2 diabetes mellitus without complications: Secondary | ICD-10-CM | POA: Diagnosis not present

## 2023-01-18 DIAGNOSIS — R030 Elevated blood-pressure reading, without diagnosis of hypertension: Secondary | ICD-10-CM | POA: Diagnosis not present

## 2023-01-18 DIAGNOSIS — M545 Low back pain, unspecified: Secondary | ICD-10-CM | POA: Diagnosis not present

## 2023-01-18 DIAGNOSIS — G8929 Other chronic pain: Secondary | ICD-10-CM | POA: Diagnosis not present

## 2023-01-18 DIAGNOSIS — Z79899 Other long term (current) drug therapy: Secondary | ICD-10-CM | POA: Diagnosis not present

## 2023-01-18 DIAGNOSIS — M792 Neuralgia and neuritis, unspecified: Secondary | ICD-10-CM | POA: Diagnosis not present

## 2023-01-18 DIAGNOSIS — M62830 Muscle spasm of back: Secondary | ICD-10-CM | POA: Diagnosis not present

## 2023-01-20 DIAGNOSIS — Z79899 Other long term (current) drug therapy: Secondary | ICD-10-CM | POA: Diagnosis not present

## 2023-02-11 DIAGNOSIS — R197 Diarrhea, unspecified: Secondary | ICD-10-CM | POA: Diagnosis not present

## 2023-02-11 DIAGNOSIS — R1084 Generalized abdominal pain: Secondary | ICD-10-CM | POA: Diagnosis not present

## 2023-02-11 DIAGNOSIS — R030 Elevated blood-pressure reading, without diagnosis of hypertension: Secondary | ICD-10-CM | POA: Diagnosis not present

## 2023-02-11 DIAGNOSIS — F17200 Nicotine dependence, unspecified, uncomplicated: Secondary | ICD-10-CM | POA: Diagnosis not present

## 2023-02-11 DIAGNOSIS — E7800 Pure hypercholesterolemia, unspecified: Secondary | ICD-10-CM | POA: Diagnosis not present

## 2023-02-11 DIAGNOSIS — K580 Irritable bowel syndrome with diarrhea: Secondary | ICD-10-CM | POA: Diagnosis not present

## 2023-02-11 DIAGNOSIS — F17210 Nicotine dependence, cigarettes, uncomplicated: Secondary | ICD-10-CM | POA: Diagnosis not present

## 2023-02-17 DIAGNOSIS — E119 Type 2 diabetes mellitus without complications: Secondary | ICD-10-CM | POA: Diagnosis not present

## 2023-02-17 DIAGNOSIS — M792 Neuralgia and neuritis, unspecified: Secondary | ICD-10-CM | POA: Diagnosis not present

## 2023-02-17 DIAGNOSIS — M545 Low back pain, unspecified: Secondary | ICD-10-CM | POA: Diagnosis not present

## 2023-02-17 DIAGNOSIS — M62830 Muscle spasm of back: Secondary | ICD-10-CM | POA: Diagnosis not present

## 2023-02-17 DIAGNOSIS — R5383 Other fatigue: Secondary | ICD-10-CM | POA: Diagnosis not present

## 2023-02-17 DIAGNOSIS — R030 Elevated blood-pressure reading, without diagnosis of hypertension: Secondary | ICD-10-CM | POA: Diagnosis not present

## 2023-02-17 DIAGNOSIS — E559 Vitamin D deficiency, unspecified: Secondary | ICD-10-CM | POA: Diagnosis not present

## 2023-02-17 DIAGNOSIS — G8929 Other chronic pain: Secondary | ICD-10-CM | POA: Diagnosis not present

## 2023-02-17 DIAGNOSIS — E7800 Pure hypercholesterolemia, unspecified: Secondary | ICD-10-CM | POA: Diagnosis not present

## 2023-02-17 DIAGNOSIS — Z79899 Other long term (current) drug therapy: Secondary | ICD-10-CM | POA: Diagnosis not present

## 2023-02-19 DIAGNOSIS — Z79899 Other long term (current) drug therapy: Secondary | ICD-10-CM | POA: Diagnosis not present

## 2023-02-22 DIAGNOSIS — R197 Diarrhea, unspecified: Secondary | ICD-10-CM | POA: Diagnosis not present

## 2023-03-22 DIAGNOSIS — Z79899 Other long term (current) drug therapy: Secondary | ICD-10-CM | POA: Diagnosis not present

## 2023-03-22 DIAGNOSIS — E7800 Pure hypercholesterolemia, unspecified: Secondary | ICD-10-CM | POA: Diagnosis not present

## 2023-03-22 DIAGNOSIS — M545 Low back pain, unspecified: Secondary | ICD-10-CM | POA: Diagnosis not present

## 2023-03-22 DIAGNOSIS — E119 Type 2 diabetes mellitus without complications: Secondary | ICD-10-CM | POA: Diagnosis not present

## 2023-03-22 DIAGNOSIS — K580 Irritable bowel syndrome with diarrhea: Secondary | ICD-10-CM | POA: Diagnosis not present

## 2023-03-22 DIAGNOSIS — G8929 Other chronic pain: Secondary | ICD-10-CM | POA: Diagnosis not present

## 2023-04-19 DIAGNOSIS — G8929 Other chronic pain: Secondary | ICD-10-CM | POA: Diagnosis not present

## 2023-04-19 DIAGNOSIS — Z79899 Other long term (current) drug therapy: Secondary | ICD-10-CM | POA: Diagnosis not present

## 2023-04-19 DIAGNOSIS — M545 Low back pain, unspecified: Secondary | ICD-10-CM | POA: Diagnosis not present

## 2023-04-19 DIAGNOSIS — E119 Type 2 diabetes mellitus without complications: Secondary | ICD-10-CM | POA: Diagnosis not present

## 2023-04-21 DIAGNOSIS — Z79899 Other long term (current) drug therapy: Secondary | ICD-10-CM | POA: Diagnosis not present

## 2023-06-28 DIAGNOSIS — E7800 Pure hypercholesterolemia, unspecified: Secondary | ICD-10-CM | POA: Diagnosis not present

## 2023-06-28 DIAGNOSIS — M62838 Other muscle spasm: Secondary | ICD-10-CM | POA: Diagnosis not present

## 2023-06-28 DIAGNOSIS — G4730 Sleep apnea, unspecified: Secondary | ICD-10-CM | POA: Diagnosis not present

## 2023-06-28 DIAGNOSIS — E119 Type 2 diabetes mellitus without complications: Secondary | ICD-10-CM | POA: Diagnosis not present

## 2023-07-06 DIAGNOSIS — I70219 Atherosclerosis of native arteries of extremities with intermittent claudication, unspecified extremity: Secondary | ICD-10-CM | POA: Diagnosis not present

## 2023-09-18 ENCOUNTER — Encounter (HOSPITAL_COMMUNITY): Payer: Self-pay

## 2023-09-18 ENCOUNTER — Emergency Department (HOSPITAL_COMMUNITY): Payer: 59

## 2023-09-18 ENCOUNTER — Other Ambulatory Visit: Payer: Self-pay

## 2023-09-18 ENCOUNTER — Emergency Department (HOSPITAL_COMMUNITY)
Admission: EM | Admit: 2023-09-18 | Discharge: 2023-09-18 | Disposition: A | Discharge status: Home / Self Care | Payer: 59 | Attending: Emergency Medicine | Admitting: Emergency Medicine

## 2023-09-18 DIAGNOSIS — R6889 Other general symptoms and signs: Secondary | ICD-10-CM | POA: Diagnosis not present

## 2023-09-18 DIAGNOSIS — Y9301 Activity, walking, marching and hiking: Secondary | ICD-10-CM | POA: Insufficient documentation

## 2023-09-18 DIAGNOSIS — W010XXA Fall on same level from slipping, tripping and stumbling without subsequent striking against object, initial encounter: Secondary | ICD-10-CM | POA: Insufficient documentation

## 2023-09-18 DIAGNOSIS — M25562 Pain in left knee: Secondary | ICD-10-CM | POA: Diagnosis not present

## 2023-09-18 DIAGNOSIS — E119 Type 2 diabetes mellitus without complications: Secondary | ICD-10-CM | POA: Diagnosis not present

## 2023-09-18 DIAGNOSIS — I509 Heart failure, unspecified: Secondary | ICD-10-CM | POA: Insufficient documentation

## 2023-09-18 DIAGNOSIS — J449 Chronic obstructive pulmonary disease, unspecified: Secondary | ICD-10-CM | POA: Insufficient documentation

## 2023-09-18 DIAGNOSIS — S80919A Unspecified superficial injury of unspecified knee, initial encounter: Secondary | ICD-10-CM | POA: Diagnosis not present

## 2023-09-18 DIAGNOSIS — F17210 Nicotine dependence, cigarettes, uncomplicated: Secondary | ICD-10-CM | POA: Diagnosis not present

## 2023-09-18 DIAGNOSIS — M25561 Pain in right knee: Secondary | ICD-10-CM | POA: Diagnosis not present

## 2023-09-18 DIAGNOSIS — M170 Bilateral primary osteoarthritis of knee: Secondary | ICD-10-CM | POA: Diagnosis not present

## 2023-09-18 DIAGNOSIS — I110 Hypertensive heart disease with heart failure: Secondary | ICD-10-CM | POA: Insufficient documentation

## 2023-09-18 DIAGNOSIS — W19XXXA Unspecified fall, initial encounter: Secondary | ICD-10-CM

## 2023-09-18 DIAGNOSIS — I10 Essential (primary) hypertension: Secondary | ICD-10-CM | POA: Diagnosis not present

## 2023-09-18 DIAGNOSIS — Z743 Need for continuous supervision: Secondary | ICD-10-CM | POA: Diagnosis not present

## 2023-09-18 LAB — CBG MONITORING, ED: Glucose-Capillary: 102 mg/dL — ABNORMAL HIGH (ref 70–99)

## 2023-09-18 MED ORDER — ACETAMINOPHEN 500 MG PO TABS
1000.0000 mg | ORAL_TABLET | Freq: Once | ORAL | Status: AC
  Administered 2023-09-18: 1000 mg via ORAL
  Filled 2023-09-18: qty 2

## 2023-09-18 MED ORDER — OXYCODONE HCL 5 MG PO TABS
5.0000 mg | ORAL_TABLET | Freq: Once | ORAL | Status: AC
  Administered 2023-09-18: 5 mg via ORAL
  Filled 2023-09-18: qty 1

## 2023-09-18 MED ORDER — MELOXICAM 7.5 MG PO TABS: 7.5000 mg | ORAL_TABLET | Freq: Every day | ORAL | 0 refills | Status: AC

## 2023-09-18 MED ORDER — KETOROLAC TROMETHAMINE 15 MG/ML IJ SOLN
15.0000 mg | Freq: Once | INTRAMUSCULAR | Status: AC
Start: 2023-09-18 — End: 2023-09-18
  Administered 2023-09-18: 15 mg via INTRAMUSCULAR
  Filled 2023-09-18: qty 1

## 2023-09-18 NOTE — ED Notes (Signed)
Patient to XR

## 2023-09-18 NOTE — ED Notes (Signed)
Upon entering pt room to introduce myself and reassess pt pain level. Pt found sitting in the bed with her right knee bent up greater than 90 degrees, appears in nad, and talking with unknown person on her personal cell phone. Pt reports "oh my pain is  not better, its a 10!" Provider notified that pt having no difficulty with full mobility of lower extremities. Mild bruising to front of bilateral knees. As soon as attempt to reassess pt pain level, pt immediately straightens right lower extremity to lay flat.

## 2023-09-18 NOTE — Discharge Instructions (Addendum)
You have been seen here in the emergency department for fall. We have obtained a full history, performed a physical exam, in addition to other diagnostic tests and treatments. Right now, we feel that you are safe for discharge from a medical perspective, and do not have an acute life threatening illness.   You have no fractures.  To do: 1.) Take all medications as prescribed.   2.) If anything changes, or you develop fevers, chills, inability to eat or drink, severe pain, new symptoms, return of symptoms, worsening of symptoms, or any other concerns, please call 911 or come back to the emergency department as soon as possible.   3.) Please make an appointment with your primary care doctor for a follow-up visit after being seen here in the emergency department. If you do not have a primary care doctor, you can call [phone] for assistance in finding one or your health care insurance company.    Thank you for allowing me to take care of you today. We hope that you feel better soon.

## 2023-09-18 NOTE — ED Provider Notes (Signed)
[facility] EMERGENCY DEPARTMENT AT [facility] Provider Note  HPI   Elizabeth Diaz is a 57 y.o. female patient with a PMHx of chronic pain diabetes, obesity, hypertension, here today after a fall.  Patient states that she was walking, she is post to be walking with a cane or walker, and she tripped, landed on her knees, did not fall did not hit her head is not on any blood thinners.  She had no preceding symptoms prior to this, she needed assistance getting up she came for evaluation of bilateral knee pain  ROS Negative except as per HPI   Medical Decision Making   Upon presentation, the patient is afebrile hemodynamically stable neurovascular intact bilateral anterior knee pain  I performed a screening EKG and glucose, there is no signs of ischemia or arrhythmia, glucose check normal at 102.  Bilateral knee x-rays were obtained, do not think the patient requires any head or neck imaging, she did not fall she just landed on her knees, from the ground, significant arthritis present in the knees, but there is no acute fracture.  I treated this patient's pain, gave her oxycodone, which she takes at home, as well as Tylenol, as well as Toradol shot.  She requested a refill of her oxycodone medication, however I feel like this would be better served with her primary care doctor who would refill this.  I did give her a short course of meloxicam which she understands is not to be taken with her ibuprofen, last creatinine 0.73  At this time, I feel that the patient is medically cleared for discharge and have discussed this with my attending who agrees.  I discussed with the patient and/or family my overall assessment, including my physical exam, labs, imaging, other diagnostic tests, and therapeutics given.  All questions answered and understanding is expressed.  I have instructed to call PCP to establish an outpatient appointment after this ED visit, and necessary specialty follow up if  needed. I gave strict return precautions to come back to the ED including fevers, chills, severe pain, worsening of symptoms, return of symptoms, new and concerning symptoms, inability to tolerate p.o. intake, among others. I specifically stated to return if symptoms worsen or return   1. Fall, initial encounter     @DISPOSITION @  Rx / DC Orders ED Discharge Orders          Ordered    meloxicam (MOBIC) 7.5 MG tablet  Daily        09/18/23 1940             Past Medical History:  Diagnosis Date   Arthritis    Atypical chest pain 12/12/2015   Benign neoplasm of rectum    Chronic pain    Congestive heart failure (CHF) (HCC)    COPD (chronic obstructive pulmonary disease) (HCC)    Diabetes mellitus (HCC)    Diverticulosis    Foot pain, left 07/01/2021   GERD (gastroesophageal reflux disease) 12/29/2016   Head pain 07/01/2021   HTN (hypertension) 12/29/2016   Hyperplastic colon polyp    Lower extremity edema 12/12/2015   Mood altered 10/05/2015   Numbness    left leg   Obesity    Pneumothorax    Routine screening for STI (sexually transmitted infection) 08/07/2020   Schatzki's ring    Screen for sexually transmitted diseases 08/07/2020   Seizures (HCC)    hx of, no seizures last few years   Type 2 diabetes mellitus (HCC) 01/27/2018  UTI (urinary tract infection) 01/29/2020   Past Surgical History:  Procedure Laterality Date   CHEST TUBE INSERTION     COLONOSCOPY WITH PROPOFOL N/A 11/01/2014   Procedure: COLONOSCOPY WITH PROPOFOL;  Surgeon: Beverley Fiedler, MD;  Location: [facility];  Service: Gastroenterology;  Laterality: N/A;   ESOPHAGOGASTRODUODENOSCOPY (EGD) WITH PROPOFOL N/A 07/29/2015   Procedure: ESOPHAGOGASTRODUODENOSCOPY (EGD) WITH PROPOFOL;  Surgeon: Beverley Fiedler, MD;  Location: [facility];  Service: Gastroenterology;  Laterality: N/A;   PERIPHERAL VASCULAR CATHETERIZATION N/A 01/07/2016   Procedure: Lower Extremity Angiography;  Surgeon: Yates Decamp, MD;  Location: [facility]  INVASIVE CV LAB;  Service: Cardiovascular;  Laterality: N/A;   PERIPHERAL VASCULAR CATHETERIZATION Right 02/25/2016   Procedure: Peripheral Vascular Atherectomy;  Surgeon: Yates Decamp, MD;  Location: [facility] INVASIVE CV LAB;  Service: Cardiovascular;  Laterality: Right;  SFA ATHERECTOMY/DRUG COATED PTA   TUBAL LIGATION     Family History  Problem Relation Age of Onset   Heart failure Father    Diabetes Father    Diabetes Maternal Grandmother    Diabetes Paternal Grandfather    Parkinson's disease Paternal Grandfather    Cancer Maternal Grandfather        unknown type   Social History   Socioeconomic History   Marital status: Single    Spouse name: Not on file   Number of children: 2   Years of education: Not on file   Highest education level: Not on file  Occupational History   Occupation: Prep Cook  Tobacco Use   Smoking status: Some Days    Current packs/day: 0.00    Types: Cigarettes    Start date: 05/04/2009    Last attempt to quit: 05/04/2009    Years since quitting: 14.3    Passive exposure: Current   Smokeless tobacco: Never   Tobacco comments:    Smokes about 2 cigs / week  Vaping Use   Vaping status: Never Used  Substance and Sexual Activity   Alcohol use: Yes    Comment: occasionally   Drug use: Not Currently    Types: Cocaine, Marijuana    Comment: 20 year history of crack cocaine. Haven't used any in two years.    Sexual activity: Not on file  Other Topics Concern   Not on file  Social History Narrative   Lives in [place]    Works in [employer] at [employer])   Hobbies: listening to music. Bowling.    Social Drivers of Corporate investment banker Strain: High Risk (06/27/2018)   Received from [facility], [facility]   Overall Financial Resource Strain (CARDIA)    Difficulty of Paying Living Expenses: Hard  Food Insecurity: Food Insecurity Present (06/27/2018)   Received from [facility],  [facility]   Hunger Vital Sign    Worried About Running Out of Food in the Last Year: Sometimes true    Ran Out of Food in the Last Year: Sometimes true  Transportation Needs: No Transportation Needs (06/27/2018)   Received from [facility], [facility]   PRAPARE - Transportation    Lack of Transportation (Medical): No    Lack of Transportation (Non-Medical): No  Physical Activity: Insufficiently Active (06/27/2018)   Received from [facility], [facility]   Exercise Vital Sign    Days of Exercise per Week: 2 days    Minutes of Exercise per Session: 30 min  Stress:  Stress Concern Present (06/27/2018)   Received from [facility], [facility]   [employer] of Occupational Health - Occupational Stress Questionnaire    Feeling of Stress : Rather much  Social Connections: Unknown (02/09/2022)   Received from [facility], [facility]   Social Network    Social Network: Not on file  Intimate Partner Violence: Unknown (01/01/2022)   Received from [facility], [facility]   HITS    Physically Hurt: Not on file    Insult or Talk Down To: Not on file    Threaten Physical Harm: Not on file    Scream or Curse: Not on file     Physical Exam   Vitals:   09/18/23 1741 09/18/23 1746 09/18/23 1900  BP:  111/88 (!) 150/72  Pulse:  76 75  Resp:  20 (!) 23  Temp:  98.6 F (37 C)   TempSrc:  Oral   SpO2:  100% 97%  Weight: 107 kg    Height: 5' (1.524 m)      Physical Exam Vitals and nursing note reviewed.  Constitutional:      General: She is not in acute distress.    Appearance: She is well-developed. She is obese.     Comments: Morbid obesity  HENT:     Head: Normocephalic and atraumatic.  Eyes:     Conjunctiva/sclera: Conjunctivae normal.  Neck:     Comments: No cervical thoracic or lumbar midline tenderness Cardiovascular:     Rate and Rhythm:  Normal rate and regular rhythm.     Heart sounds: No murmur heard. Pulmonary:     Effort: Pulmonary effort is normal. No respiratory distress.     Breath sounds: Normal breath sounds.  Abdominal:     Palpations: Abdomen is soft.     Tenderness: There is no abdominal tenderness.  Musculoskeletal:        General: No swelling.     Cervical back: Neck supple.     Comments: There is bilateral anterior patellar tenderness, there is distally neurovascular intact, 2+ DP and PT pulses,  Skin:    General: Skin is warm and dry.     Capillary Refill: Capillary refill takes less than 2 seconds.  Neurological:     Mental Status: She is alert.  Psychiatric:        Mood and Affect: Mood normal.      Procedures   If procedures were preformed on this patient, they are listed below:  Procedures  The patient was seen, evaluated, and treated in conjunction with the attending physician, who voiced agreement in the care provided.  Note generated using Dragon voice dictation software and may contain dictation errors. Please contact me for any clarification or with any questions.   Electronically signed by:  Osvaldo Shipper, M.D. (PGY-2)    Gunnar Bulla, MD 09/18/23 2037    Eber Hong, MD 09/19/23 1114

## 2023-09-18 NOTE — ED Triage Notes (Addendum)
Patient arrives via [facility] for bilateral knee pain that resulted from a fall yesterday when walking. Patient fell to her knees on the cement stating her legs felt weak, denies hitting her head, denies thinners, denies LOC. Patient endorses chronic knee pain that has worsened since fall making it difficult to walk. Patient arrives GCS 15, alert and oriented. VSS per EMS. Hx of chronic back and knee pain.

## 2023-09-18 NOTE — ED Notes (Signed)
Pt ambulatory from her room to restroom and back unassisted with steady gait observed.

## 2023-10-19 ENCOUNTER — Other Ambulatory Visit: Payer: Self-pay | Admitting: Nurse Practitioner

## 2023-10-19 DIAGNOSIS — Z1231 Encounter for screening mammogram for malignant neoplasm of breast: Secondary | ICD-10-CM

## 2023-11-03 ENCOUNTER — Ambulatory Visit: Payer: 59

## 2023-11-09 ENCOUNTER — Ambulatory Visit: Payer: 59

## 2023-12-22 ENCOUNTER — Other Ambulatory Visit: Payer: Self-pay | Admitting: Nurse Practitioner

## 2023-12-22 DIAGNOSIS — G8929 Other chronic pain: Secondary | ICD-10-CM

## 2023-12-24 ENCOUNTER — Telehealth: Payer: Self-pay | Admitting: Gastroenterology

## 2023-12-24 NOTE — Telephone Encounter (Signed)
 Good morning Dr. Lavon Paganini  The following patient is requesting to transfer care to our facility for IBS. She has been a patient of [facility] but after losing her car, she is unable to continue going there and needs something closer to home. Records are available with care everywhere. Please review and advise of scheduling. Thank you.

## 2023-12-24 NOTE — Telephone Encounter (Signed)
 Ok to schedule office visit, please inform patient that it may be few months before we can get her

## 2023-12-29 ENCOUNTER — Ambulatory Visit
Admission: RE | Admit: 2023-12-29 | Discharge: 2023-12-29 | Disposition: A | Discharge status: Home / Self Care | Source: Ambulatory Visit | Attending: Nurse Practitioner

## 2023-12-29 DIAGNOSIS — R519 Other chronic pain: Secondary | ICD-10-CM

## 2023-12-30 ENCOUNTER — Ambulatory Visit
Admission: RE | Admit: 2023-12-30 | Discharge: 2023-12-30 | Disposition: A | Discharge status: Home / Self Care | Source: Ambulatory Visit | Attending: Nurse Practitioner | Admitting: Nurse Practitioner

## 2023-12-30 DIAGNOSIS — Z1231 Encounter for screening mammogram for malignant neoplasm of breast: Secondary | ICD-10-CM

## 2023-12-31 ENCOUNTER — Encounter: Payer: Self-pay | Admitting: Gastroenterology

## 2023-12-31 NOTE — Telephone Encounter (Signed)
 Call disconnected while trying to schedule with Dr. Lavon Paganini

## 2024-01-10 ENCOUNTER — Ambulatory Visit: Admitting: Podiatry

## 2024-01-31 ENCOUNTER — Ambulatory Visit (INDEPENDENT_AMBULATORY_CARE_PROVIDER_SITE_OTHER): Admitting: Podiatry

## 2024-01-31 ENCOUNTER — Encounter: Payer: Self-pay | Admitting: Podiatry

## 2024-01-31 VITALS — Ht 60.0 in | Wt 236.0 lb

## 2024-01-31 DIAGNOSIS — R2681 Unsteadiness on feet: Secondary | ICD-10-CM | POA: Diagnosis not present

## 2024-01-31 DIAGNOSIS — E0843 Diabetes mellitus due to underlying condition with diabetic autonomic (poly)neuropathy: Secondary | ICD-10-CM | POA: Diagnosis not present

## 2024-01-31 DIAGNOSIS — M5416 Radiculopathy, lumbar region: Secondary | ICD-10-CM

## 2024-01-31 NOTE — Progress Notes (Signed)
 Chief Complaint  Patient presents with   Diabetes    NP (Diabetic) feet get numb when walking and on shoots thru [facility] foot and eval for orthotics    HPI: 58 y.o. female presenting today for evaluation of numbness and tingling neuropathy symptoms to the bilateral feet.  Patient also has a long history of chronic lumbar radiculopathy and is being managed by pain management.  Past Medical History:  Diagnosis Date   Arthritis    Atypical chest pain 12/12/2015   Benign neoplasm of rectum    Chronic pain    Congestive heart failure (CHF) (HCC)    COPD (chronic obstructive pulmonary disease) (HCC)    Diabetes mellitus (HCC)    Diverticulosis    Foot pain, left 07/01/2021   GERD (gastroesophageal reflux disease) 12/29/2016   Head pain 07/01/2021   HTN (hypertension) 12/29/2016   Hyperplastic colon polyp    Lower extremity edema 12/12/2015   Mood altered 10/05/2015   Numbness    left leg   Obesity    Pneumothorax    Routine screening for STI (sexually transmitted infection) 08/07/2020   Schatzki's ring    Screen for sexually transmitted diseases 08/07/2020   Seizures (HCC)    hx of, no seizures last few years   Type 2 diabetes mellitus (HCC) 01/27/2018   UTI (urinary tract infection) 01/29/2020    Past Surgical History:  Procedure Laterality Date   CHEST TUBE INSERTION     COLONOSCOPY WITH PROPOFOL  N/A 11/01/2014   Procedure: COLONOSCOPY WITH PROPOFOL ;  Surgeon: Nannette Babe, MD;  [facility];  Service: Gastroenterology;  Laterality: N/A;   ESOPHAGOGASTRODUODENOSCOPY (EGD) WITH PROPOFOL  N/A 07/29/2015   Procedure: ESOPHAGOGASTRODUODENOSCOPY (EGD) WITH PROPOFOL ;  Surgeon: Nannette Babe, MD;  Location: [facility];  Service: Gastroenterology;  Laterality: N/A;   PERIPHERAL VASCULAR CATHETERIZATION N/A 01/07/2016   Procedure: Lower Extremity Angiography;  Surgeon: Knox Perl, MD;  Location: [facility] INVASIVE CV LAB;  Service: Cardiovascular;  Laterality: N/A;   PERIPHERAL VASCULAR  CATHETERIZATION Right 02/25/2016   Procedure: Peripheral Vascular Atherectomy;  Surgeon: Knox Perl, MD;  Location: [facility] INVASIVE CV LAB;  Service: Cardiovascular;  Laterality: Right;  SFA ATHERECTOMY/DRUG COATED PTA   TUBAL LIGATION      No Known Allergies   Physical Exam: General: The patient is alert and oriented x3 in no acute distress.  Dermatology: Skin is warm, dry and supple bilateral lower extremities.   Vascular: Known history of peripheral vascular disease.  Managed with vascular  Neurological: Grossly intact via light touch.Paresthesia with numbness and tingling sensation noted to the bilateral feet  Musculoskeletal Exam: No pedal deformity   Assessment/Plan of Care: 1.  Diabetes mellitus with peripheral polyneuropathy 2.  Lumbar radiculopathy; chronic 3.  Gait instability  -Patient evaluated.  Comprehensive diabetic foot exam performed today -Order placed for custom molded diabetic insoles with extra-depth diabetic shoes faxed to [facility] -Continue management of her chronic pain with pain management -Return to clinic with us  as needed       Dot Gazella, DPM [facility]  Dr. Dot Gazella, DPM    [address] [place], [place] [address]  Office [phone]  Fax [phone]

## 2024-03-08 ENCOUNTER — Ambulatory Visit: Admitting: Gastroenterology

## 2024-04-18 ENCOUNTER — Other Ambulatory Visit: Payer: Self-pay | Admitting: Nurse Practitioner

## 2024-04-18 DIAGNOSIS — L989 Disorder of the skin and subcutaneous tissue, unspecified: Secondary | ICD-10-CM

## 2024-04-19 ENCOUNTER — Ambulatory Visit
Admission: RE | Admit: 2024-04-19 | Discharge: 2024-04-19 | Disposition: A | Discharge status: Home / Self Care | Source: Ambulatory Visit | Attending: Nurse Practitioner | Admitting: Nurse Practitioner

## 2024-04-19 DIAGNOSIS — L989 Disorder of the skin and subcutaneous tissue, unspecified: Secondary | ICD-10-CM

## 2024-07-18 ENCOUNTER — Ambulatory Visit (HOSPITAL_COMMUNITY): Admission: EM | Admit: 2024-07-18 | Discharge: 2024-07-18 | Disposition: A | Discharge status: Home / Self Care

## 2024-07-18 ENCOUNTER — Encounter (HOSPITAL_COMMUNITY): Payer: Self-pay | Admitting: Emergency Medicine

## 2024-07-18 DIAGNOSIS — M720 Palmar fascial fibromatosis [Dupuytren]: Secondary | ICD-10-CM

## 2024-07-18 NOTE — Discharge Instructions (Addendum)
Recommend follow up with orthopedics.

## 2024-07-18 NOTE — ED Triage Notes (Signed)
 Pt c/o right middle finger pain and will get stuck for about a month. Denies any injury.

## 2024-07-18 NOTE — ED Provider Notes (Signed)
 [facility]    CSN: [id number] Arrival date & time: 07/18/24  1823      History   Chief Complaint Chief Complaint  Patient presents with   Hand Pain    HPI Elizabeth Diaz is a 58 y.o. female.   Patient presents with right middle finger pain and stating that the finger gets stuck in flexion.  This has been going on for about a month.  Denies injury or trauma.  Nothing seems to make symptoms better or worse.  She has never experienced this before.    Past Medical History:  Diagnosis Date   Arthritis    Atypical chest pain 12/12/2015   Benign neoplasm of rectum    Chronic pain    Congestive heart failure (CHF) (HCC)    COPD (chronic obstructive pulmonary disease) (HCC)    Diabetes mellitus (HCC)    Diverticulosis    Foot pain, left 07/01/2021   GERD (gastroesophageal reflux disease) 12/29/2016   Head pain 07/01/2021   HTN (hypertension) 12/29/2016   Hyperplastic colon polyp    Lower extremity edema 12/12/2015   Mood altered 10/05/2015   Numbness    left leg   Obesity    Pneumothorax    Routine screening for STI (sexually transmitted infection) 08/07/2020   Schatzki's ring    Screen for sexually transmitted diseases 08/07/2020   Seizures (HCC)    hx of, no seizures last few years   Type 2 diabetes mellitus (HCC) 01/27/2018   UTI (urinary tract infection) 01/29/2020    Patient Active Problem List   Diagnosis Date Noted   Delusional disorder currently symptomatic (HCC) 04/05/2022   Bilateral shoulder pain 03/29/2022   Post-menopausal bleeding 11/05/2021   Shoulder pain, left 10/28/2021   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, subsequent encounter (HCC) 09/20/2021   Diabetic polyneuropathy associated with type 2 diabetes mellitus (HCC) 12/27/2020   (HFpEF) heart failure with preserved ejection fraction (HCC) 06/12/2020   Meralgia paraesthetica 11/10/2018   Bipolar affective disorder, depressed (HCC) 08/27/2018   Onychomycosis  02/14/2018   Type 2 diabetes mellitus without complication, without long-term current use of insulin  (HCC) 01/27/2018   COPD (chronic obstructive pulmonary disease) (HCC) 12/29/2016   Mixed hyperlipidemia - secondary prevention of LDL <70 12/29/2016   Gastro-esophageal reflux disease without esophagitis 08/09/2016   Carotid bruit 08/09/2016   PAD (peripheral artery disease) 02/25/2016   Obstructive sleep apnea, adult 02/07/2016   HTN, goal below 140/90 01/23/2016   Atherosclerosis with limb claudication 01/23/2016   Claudication in peripheral vascular disease 01/06/2016   Hearing loss 11/27/2015   Morbid obesity (HCC) 10/29/2015   Excessive daytime sleepiness 10/05/2015   Chronic diarrhea    Urinary incontinence 04/08/2015   Bilateral chronic knee pain 12/07/2014   IBS (irritable bowel syndrome)    Chronic back pain 05/14/2014    Past Surgical History:  Procedure Laterality Date   CHEST TUBE INSERTION     COLONOSCOPY WITH PROPOFOL  N/A 11/01/2014   Procedure: COLONOSCOPY WITH PROPOFOL ;  Surgeon: Gordy CHRISTELLA Starch, MD;  Location: [facility];  Service: Gastroenterology;  Laterality: N/A;   ESOPHAGOGASTRODUODENOSCOPY (EGD) WITH PROPOFOL  N/A 07/29/2015   Procedure: ESOPHAGOGASTRODUODENOSCOPY (EGD) WITH PROPOFOL ;  Surgeon: Gordy CHRISTELLA Starch, MD;  Location: [facility];  Service: Gastroenterology;  Laterality: N/A;   PERIPHERAL VASCULAR CATHETERIZATION N/A 01/07/2016   Procedure: Lower Extremity Angiography;  Surgeon: Gordy Bergamo, MD;  Location: [facility] INVASIVE CV LAB;  Service: Cardiovascular;  Laterality: N/A;   PERIPHERAL VASCULAR CATHETERIZATION  Right 02/25/2016   Procedure: Peripheral Vascular Atherectomy;  Surgeon: Gordy Bergamo, MD;  Location: [facility] INVASIVE CV LAB;  Service: Cardiovascular;  Laterality: Right;  SFA ATHERECTOMY/DRUG COATED PTA   TUBAL LIGATION      OB History   No obstetric history on file.      Home Medications    Prior to Admission medications   Medication Sig Start Date End  Date Taking? Authorizing Provider  albuterol  (ACCUNEB ) 1.25 MG/3ML nebulizer solution Take 3 mLs (1.25 mg total) by nebulization every 6 (six) hours as needed. 04/20/22   Macario Dorothyann HERO, MD  amLODipine  (NORVASC ) 5 MG tablet Take 1 tablet (5 mg total) by mouth daily. 04/20/22   Macario Dorothyann HERO, MD  ARIPiprazole  (ABILIFY ) 5 MG tablet Take 1 tablet (5 mg total) by mouth daily. 05/15/22 08/13/22  Carlyon Richerd PARAS, DO  blood glucose meter kit and supplies KIT Dispense based on patient and insurance preference. Use once daily as directed. Dx code E11.9. 11/30/18   Scarlet Elsie LABOR, MD  Blood Glucose Monitoring Suppl (ONETOUCH VERIO FLEX SYSTEM) w/Device KIT Use glucometer to test sugar. DX: E11.9 02/01/20   Shirley, [place], DO  budesonide -formoterol  (SYMBICORT ) 160-4.5 MCG/ACT inhaler Inhale 2 puffs into the lungs 2 (two) times daily. 04/20/22   Macario Dorothyann HERO, MD  cyclobenzaprine  (FLEXERIL ) 10 MG tablet TAKE 1 TO 2 TABLETS BY MOUTH TWICE DAILY AS NEEDED FOR MUSCLE SPASM 05/15/22   Paige, Victoria J, DO  diclofenac  Sodium (VOLTAREN ) 1 % GEL Apply 1 Application topically 3 (three) times daily as needed (pain). 04/20/22   Macario Dorothyann HERO, MD  DULoxetine  (CYMBALTA ) 30 MG capsule Take 1 capsule (30 mg total) by mouth daily. 05/15/22 08/13/22  Paige, Victoria J, DO  fluticasone  (FLONASE ) 50 MCG/ACT nasal spray Place 2 sprays into both nostrils daily as needed for up to 14 days for allergies or rhinitis. 04/20/22 05/04/22  Macario Dorothyann HERO, MD  furosemide  (LASIX ) 40 MG tablet TAKE 1/2 (ONE-HALF) TABLET BY MOUTH ONCE DAILY AS NEEDED Patient taking differently: Take 20 mg by mouth daily as needed for fluid or edema. 11/26/21   Macario Dorothyann HERO, MD  gabapentin  (NEURONTIN ) 400 MG capsule Take 1 capsule (400 mg total) by mouth 3 (three) times daily. 04/20/22 05/20/22  Macario Dorothyann HERO, MD  gabapentin  (NEURONTIN ) 800 MG tablet TAKE 1 TABLET BY MOUTH THREE TIMES DAILY 05/11/22   Macario Dorothyann HERO, MD  glucose blood  [facility] VERIO) test strip Use as instructed 01/29/20   Shirley, [place], DO  hydrochlorothiazide  (HYDRODIURIL ) 12.5 MG tablet Take 1 tablet (12.5 mg total) by mouth daily. 04/20/22 05/20/22  Macario Dorothyann HERO, MD  hydrOXYzine  (ATARAX ) 25 MG tablet Take 1 tablet (25 mg total) by mouth 3 (three) times daily as needed for anxiety or itching. 05/15/22   Paige, Victoria J, DO  Lancets Misc. (ACCU-CHEK SOFTCLIX LANCET DEV) KIT Use as instructed to test sugars once daily.  Dx code:E11.9 11/30/18   Scarlet Elsie LABOR, MD  meloxicam  (MOBIC ) 7.5 MG tablet Take 1 tablet (7.5 mg total) by mouth daily. 09/18/23   Gaetano Pac, MD  mometasone -formoterol  (DULERA ) 200-5 MCG/ACT AERO Inhale 2 puffs into the lungs 2 (two) times daily. 04/07/22 05/07/22  Onuoha, Josephine C, NP  OneTouch Delica Lancets 33G MISC 1 Units by Does not apply route daily. 01/29/20   Shirley, [place], DO  Semaglutide , 2 MG/DOSE, (OZEMPIC , 2 MG/DOSE,) 8 MG/3ML SOPN Inject 2 mg into the skin once a week. 05/15/22   Paige, Victoria J, DO  traZODone  (DESYREL ) 50 MG tablet Take 1 tablet (50 mg total) by mouth at bedtime for 14 days. 04/07/22 04/21/22  Alan Gailen BROCKS, NP    Family History Family History  Problem Relation Age of Onset   Heart failure Father    Diabetes Father    Diabetes Maternal Grandmother    Cancer Maternal Grandfather        unknown type   Diabetes Paternal Grandfather    Parkinson's disease Paternal Grandfather    BRCA 1/2 Neg Hx    Breast cancer Neg Hx     Social History Social History   Tobacco Use   Smoking status: Some Days    Current packs/day: 0.00    Types: Cigarettes    Start date: 05/04/2009    Last attempt to quit: 05/04/2009    Years since quitting: 15.2    Passive exposure: Current   Smokeless tobacco: Never   Tobacco comments:    Smokes about 2 cigs / week  Vaping Use   Vaping status: Never Used  Substance Use Topics   Alcohol use: Yes    Comment: occasionally   Drug use: Not Currently    Types:  Cocaine, Marijuana    Comment: 20 year history of crack cocaine. Haven't used any in two years.      Allergies   Patient has no known allergies.   Review of Systems Review of Systems  Constitutional:  Negative for chills and fever.  HENT:  Negative for ear pain and sore throat.   Eyes:  Negative for pain and visual disturbance.  Respiratory:  Negative for cough and shortness of breath.   Cardiovascular:  Negative for chest pain and palpitations.  Gastrointestinal:  Negative for abdominal pain and vomiting.  Genitourinary:  Negative for dysuria and hematuria.  Musculoskeletal:  Positive for arthralgias. Negative for back pain.  Skin:  Negative for color change and rash.  Neurological:  Negative for seizures and syncope.  All other systems reviewed and are negative.    Physical Exam Triage Vital Signs ED Triage Vitals  Encounter Vitals Group     BP 07/18/24 1846 123/63     Girls Systolic BP Percentile --      Girls Diastolic BP Percentile --      Boys Systolic BP Percentile --      Boys Diastolic BP Percentile --      Pulse Rate 07/18/24 1846 91     Resp 07/18/24 1846 18     Temp 07/18/24 1846 99.1 F (37.3 C)     Temp Source 07/18/24 1846 Oral     SpO2 07/18/24 1846 97 %     Weight --      Height --      Head Circumference --      Peak Flow --      Pain Score 07/18/24 1845 10     Pain Loc --      Pain Education --      Exclude from Growth Chart --    No data found.  Updated Vital Signs BP 123/63 (BP Location: Left Arm)   Pulse 91   Temp 99.1 F (37.3 C) (Oral)   Resp 18   LMP 12/03/2016 (Approximate)   SpO2 97%   Visual Acuity Right Eye Distance:   Left Eye Distance:   Bilateral Distance:    Right Eye Near:   Left Eye Near:    Bilateral Near:     Physical Exam Vitals and nursing note reviewed.  Constitutional:      General: She is not in acute distress.    Appearance: She is well-developed.  HENT:     Head: Normocephalic and atraumatic.   Eyes:     Conjunctiva/sclera: Conjunctivae normal.  Cardiovascular:     Rate and Rhythm: Normal rate and regular rhythm.     Heart sounds: No murmur heard. Pulmonary:     Effort: Pulmonary effort is normal. No respiratory distress.     Breath sounds: Normal breath sounds.  Abdominal:     Palpations: Abdomen is soft.     Tenderness: There is no abdominal tenderness.  Musculoskeletal:        General: No swelling.     Cervical back: Neck supple.     Comments: Palpable small nodule along flexor tendon near distal palmar crease of right middle finger.  Finger does get stuck in flexion.  Skin:    General: Skin is warm and dry.     Capillary Refill: Capillary refill takes less than 2 seconds.  Neurological:     Mental Status: She is alert.  Psychiatric:        Mood and Affect: Mood normal.      UC Treatments / Results  Labs (all labs ordered are listed, but only abnormal results are displayed) Labs Reviewed - No data to display  EKG   Radiology No results found.  Procedures Procedures (including critical care time)  Medications Ordered in UC Medications - No data to display  Initial Impression / Assessment and Plan / UC Course  I have reviewed the triage vital signs and the nursing notes.  Pertinent labs & imaging results that were available during my care of the patient were reviewed by me and considered in my medical decision making (see chart for details).     Likely Dupuytren's contracture of right middle finger.  Supportive care discussed.  Advised follow-up with orthopedics for possible steroid injection. Final Clinical Impressions(s) / UC Diagnoses   Final diagnoses:  Dupuytren's contracture of right hand     Discharge Instructions      Recommend follow up with orthopedics     ED Prescriptions   None    PDMP not reviewed this encounter.   Ward, Harlene PEDLAR, PA-C 07/18/24 [phone]

## 2024-07-31 ENCOUNTER — Ambulatory Visit (INDEPENDENT_AMBULATORY_CARE_PROVIDER_SITE_OTHER): Admitting: Podiatry

## 2024-07-31 ENCOUNTER — Encounter: Payer: Self-pay | Admitting: Podiatry

## 2024-07-31 VITALS — Ht 60.0 in | Wt 236.0 lb

## 2024-07-31 DIAGNOSIS — E0843 Diabetes mellitus due to underlying condition with diabetic autonomic (poly)neuropathy: Secondary | ICD-10-CM

## 2024-07-31 DIAGNOSIS — M5416 Radiculopathy, lumbar region: Secondary | ICD-10-CM

## 2024-07-31 DIAGNOSIS — R2681 Unsteadiness on feet: Secondary | ICD-10-CM | POA: Diagnosis not present

## 2024-07-31 NOTE — Progress Notes (Signed)
 Chief Complaint  Patient presents with   Diabetes    Pt is here needing last note for diabetic shoes with Elizabeth Diaz, note has to be within 4 months but her last visit seen in May.    HPI: 58 y.o. female presenting today for evaluation of numbness and tingling neuropathy symptoms to the bilateral feet.  Patient also has a long history of chronic lumbar radiculopathy and is being managed by pain management.  Past Medical History:  Diagnosis Date   Arthritis    Atypical chest pain 12/12/2015   Benign neoplasm of rectum    Chronic pain    Congestive heart failure (CHF) (HCC)    COPD (chronic obstructive pulmonary disease) (HCC)    Diabetes mellitus (HCC)    Diverticulosis    Foot pain, left 07/01/2021   GERD (gastroesophageal reflux disease) 12/29/2016   Head pain 07/01/2021   HTN (hypertension) 12/29/2016   Hyperplastic colon polyp    Lower extremity edema 12/12/2015   Mood altered 10/05/2015   Numbness    left leg   Obesity    Pneumothorax    Routine screening for STI (sexually transmitted infection) 08/07/2020   Schatzki's ring    Screen for sexually transmitted diseases 08/07/2020   Seizures (HCC)    hx of, no seizures last few years   Type 2 diabetes mellitus (HCC) 01/27/2018   UTI (urinary tract infection) 01/29/2020    Past Surgical History:  Procedure Laterality Date   CHEST TUBE INSERTION     COLONOSCOPY WITH PROPOFOL  N/A 11/01/2014   Procedure: COLONOSCOPY WITH PROPOFOL ;  Surgeon: Gordy CHRISTELLA Starch, MD;  Location: [facility];  Service: Gastroenterology;  Laterality: N/A;   ESOPHAGOGASTRODUODENOSCOPY (EGD) WITH PROPOFOL  N/A 07/29/2015   Procedure: ESOPHAGOGASTRODUODENOSCOPY (EGD) WITH PROPOFOL ;  Surgeon: Gordy CHRISTELLA Starch, MD;  Location: [facility];  Service: Gastroenterology;  Laterality: N/A;   PERIPHERAL VASCULAR CATHETERIZATION N/A 01/07/2016   Procedure: Lower Extremity Angiography;  Surgeon: Gordy Bergamo, MD;  Location: [facility] INVASIVE CV LAB;  Service: Cardiovascular;  Laterality: N/A;    PERIPHERAL VASCULAR CATHETERIZATION Right 02/25/2016   Procedure: Peripheral Vascular Atherectomy;  Surgeon: Gordy Bergamo, MD;  Location: [facility] INVASIVE CV LAB;  Service: Cardiovascular;  Laterality: Right;  SFA ATHERECTOMY/DRUG COATED PTA   TUBAL LIGATION      No Known Allergies   Physical Exam: General: The patient is alert and oriented x3 in no acute distress.  Dermatology: Skin is warm, dry and supple bilateral lower extremities.   Vascular: Known history of peripheral vascular disease.  Managed with vascular  Neurological: Grossly intact via light touch.Paresthesia with numbness and tingling sensation noted to the bilateral feet  Musculoskeletal Exam: No pedal deformity   Assessment/Plan of Care: 1.  Diabetes mellitus; controlled with peripheral polyneuropathy 2.  Lumbar radiculopathy; chronic 3.  Gait instability  Diabetes mellitus due to underlying condition with diabetic autonomic neuropathy, unspecified whether long term insulin  use (HCC)  Lumbar radiculopathy, chronic  Gait instability   -Patient evaluated.  Comprehensive diabetic foot exam performed today -Custom molded diabetic insoles with extra-depth diabetic shoes pending at [facility] -Continue management of her chronic pain with pain management -Return to clinic with us  annually    Thresa EMERSON Sar, DPM [facility]  Dr. Thresa EMERSON Sar, DPM    [address]  [place], [place] [address]                Office [phone]  Fax [phone]

## 2024-08-15 ENCOUNTER — Emergency Department (HOSPITAL_COMMUNITY)
Admission: EM | Admit: 2024-08-15 | Discharge: 2024-08-15 | Disposition: A | Discharge status: Home / Self Care | Attending: Emergency Medicine | Admitting: Emergency Medicine

## 2024-08-15 ENCOUNTER — Other Ambulatory Visit: Payer: Self-pay

## 2024-08-15 ENCOUNTER — Emergency Department (HOSPITAL_COMMUNITY)

## 2024-08-15 ENCOUNTER — Encounter (HOSPITAL_COMMUNITY): Payer: Self-pay

## 2024-08-15 DIAGNOSIS — Z79899 Other long term (current) drug therapy: Secondary | ICD-10-CM | POA: Diagnosis not present

## 2024-08-15 DIAGNOSIS — Z87891 Personal history of nicotine dependence: Secondary | ICD-10-CM | POA: Diagnosis not present

## 2024-08-15 DIAGNOSIS — I509 Heart failure, unspecified: Secondary | ICD-10-CM | POA: Insufficient documentation

## 2024-08-15 DIAGNOSIS — J449 Chronic obstructive pulmonary disease, unspecified: Secondary | ICD-10-CM | POA: Diagnosis not present

## 2024-08-15 DIAGNOSIS — R0789 Other chest pain: Secondary | ICD-10-CM | POA: Diagnosis present

## 2024-08-15 DIAGNOSIS — I110 Hypertensive heart disease with heart failure: Secondary | ICD-10-CM | POA: Diagnosis not present

## 2024-08-15 DIAGNOSIS — J209 Acute bronchitis, unspecified: Secondary | ICD-10-CM | POA: Insufficient documentation

## 2024-08-15 DIAGNOSIS — E119 Type 2 diabetes mellitus without complications: Secondary | ICD-10-CM | POA: Insufficient documentation

## 2024-08-15 LAB — CBC
HCT: 44.4 % (ref 36.0–46.0)
Hemoglobin: 14.8 g/dL (ref 12.0–15.0)
MCH: 30.6 pg (ref 26.0–34.0)
MCHC: 33.3 g/dL (ref 30.0–36.0)
MCV: 91.9 fL (ref 80.0–100.0)
Platelets: 332 K/uL (ref 150–400)
RBC: 4.83 MIL/uL (ref 3.87–5.11)
RDW: 13.9 % (ref 11.5–15.5)
WBC: 15.5 K/uL — ABNORMAL HIGH (ref 4.0–10.5)
nRBC: 0 % (ref 0.0–0.2)

## 2024-08-15 LAB — TROPONIN T, HIGH SENSITIVITY
Troponin T High Sensitivity: <15 ng/L (ref 0–19)
Troponin T High Sensitivity: <15 ng/L (ref 0–19)

## 2024-08-15 LAB — BASIC METABOLIC PANEL WITH GFR
Anion gap: 13 (ref 5–15)
BUN: 11 mg/dL (ref 6–20)
CO2: 27 mmol/L (ref 22–32)
Calcium: 9.5 mg/dL (ref 8.9–10.3)
Chloride: 100 mmol/L (ref 98–111)
Creatinine, Ser: 0.63 mg/dL (ref 0.44–1.00)
GFR, Estimated: >60 mL/min (ref >60)
Glucose, Bld: 168 mg/dL — ABNORMAL HIGH (ref 70–99)
Potassium: 3.9 mmol/L (ref 3.5–5.1)
Sodium: 139 mmol/L (ref 135–145)

## 2024-08-15 LAB — RESP PANEL BY RT-PCR (RSV, FLU A&B, COVID)  RVPGX2
Influenza A by PCR: NEGATIVE
Influenza B by PCR: NEGATIVE
Resp Syncytial Virus by PCR: NEGATIVE
SARS Coronavirus 2 by RT PCR: NEGATIVE

## 2024-08-15 LAB — D-DIMER, QUANTITATIVE: D-Dimer, Quant: <0.27 ug{FEU}/mL (ref 0.00–0.50)

## 2024-08-15 MED ORDER — KETOROLAC TROMETHAMINE 30 MG/ML IJ SOLN
30.0000 mg | Freq: Once | INTRAMUSCULAR | Status: AC
  Administered 2024-08-15: 30 mg via INTRAVENOUS
  Filled 2024-08-15: qty 1

## 2024-08-15 MED ORDER — OXYCODONE HCL 5 MG PO TABS
10.0000 mg | ORAL_TABLET | ORAL | 0 refills | Status: AC | PRN
Start: 2024-08-15 — End: ?

## 2024-08-15 MED ORDER — AZITHROMYCIN 250 MG PO TABS: 250.0000 mg | ORAL_TABLET | Freq: Every day | ORAL | 0 refills | Status: AC

## 2024-08-15 MED ORDER — PREDNISONE 20 MG PO TABS
60.0000 mg | ORAL_TABLET | Freq: Once | ORAL | Status: AC
  Administered 2024-08-15: 60 mg via ORAL
  Filled 2024-08-15: qty 3

## 2024-08-15 MED ORDER — PREDNISONE 50 MG PO TABS
50.0000 mg | ORAL_TABLET | Freq: Every day | ORAL | 0 refills | Status: AC
Start: 2024-08-15 — End: ?

## 2024-08-15 NOTE — ED Provider Notes (Signed)
 [place] EMERGENCY DEPARTMENT AT [facility] Provider Note   CSN: [id number] Arrival date & time: 08/15/24  9668     Patient presents with: Chest Pain   Elizabeth Diaz is a 58 y.o. female.   The history is provided by the patient.  Chest Pain  She has history of hypertension, diabetes, hyperlipidemia, heart failure, GERD, COPD, peripheral vascular disease, and comes in complaining of left-sided chest pain and shortness of breath over the last 3 days.  Pain is both sharp and dull, worse when she takes of breath or coughs.  Cough has been nonproductive.  She denies fever, but has had some chills and sweats sweats.  She denies nausea or vomiting.  Her pain does radiate to the left shoulder and left arm.  She states that she quit smoking 3 days ago.  She has not taken anything for pain other than her prescribed medications, states using her home nebulizer did not help her cough or her pain.    Prior to Admission medications   Medication Sig Start Date End Date Taking? Authorizing Provider  albuterol  (ACCUNEB ) 1.25 MG/3ML nebulizer solution Take 3 mLs (1.25 mg total) by nebulization every 6 (six) hours as needed. 04/20/22   Macario Dorothyann HERO, MD  amLODipine  (NORVASC ) 5 MG tablet Take 1 tablet (5 mg total) by mouth daily. 04/20/22   Macario Dorothyann HERO, MD  ARIPiprazole  (ABILIFY ) 5 MG tablet Take 1 tablet (5 mg total) by mouth daily. 05/15/22 07/31/24  Paige, Victoria J, DO  blood glucose meter kit and supplies KIT Dispense based on patient and insurance preference. Use once daily as directed. Dx code E11.9. 11/30/18   Scarlet Elsie LABOR, MD  Blood Glucose Monitoring Suppl (ONETOUCH VERIO FLEX SYSTEM) w/Device KIT Use glucometer to test sugar. DX: E11.9 02/01/20   Shirley, Jordan, DO  budesonide -formoterol  (SYMBICORT ) 160-4.5 MCG/ACT inhaler Inhale 2 puffs into the lungs 2 (two) times daily. 04/20/22   Macario Dorothyann HERO, MD  cyclobenzaprine  (FLEXERIL ) 10 MG tablet TAKE 1 TO 2 TABLETS BY  MOUTH TWICE DAILY AS NEEDED FOR MUSCLE SPASM 05/15/22   Paige, Victoria J, DO  diclofenac  Sodium (VOLTAREN ) 1 % GEL Apply 1 Application topically 3 (three) times daily as needed (pain). 04/20/22   Macario Dorothyann HERO, MD  DULoxetine  (CYMBALTA ) 30 MG capsule Take 1 capsule (30 mg total) by mouth daily. 05/15/22 07/31/24  Paige, Victoria J, DO  fluticasone  (FLONASE ) 50 MCG/ACT nasal spray Place 2 sprays into both nostrils daily as needed for up to 14 days for allergies or rhinitis. 04/20/22 07/31/24  Macario Dorothyann HERO, MD  furosemide  (LASIX ) 40 MG tablet TAKE 1/2 (ONE-HALF) TABLET BY MOUTH ONCE DAILY AS NEEDED Patient taking differently: Take 20 mg by mouth daily as needed for fluid or edema. 11/26/21   Macario Dorothyann HERO, MD  gabapentin  (NEURONTIN ) 400 MG capsule Take 1 capsule (400 mg total) by mouth 3 (three) times daily. 04/20/22 07/31/24  Macario Dorothyann HERO, MD  gabapentin  (NEURONTIN ) 800 MG tablet TAKE 1 TABLET BY MOUTH THREE TIMES DAILY 05/11/22   Macario Dorothyann HERO, MD  glucose blood [facility] VERIO) test strip Use as instructed 01/29/20   Shirley, Jordan, DO  hydrochlorothiazide  (HYDRODIURIL ) 12.5 MG tablet Take 1 tablet (12.5 mg total) by mouth daily. 04/20/22 07/31/24  Macario Dorothyann HERO, MD  hydrOXYzine  (ATARAX ) 25 MG tablet Take 1 tablet (25 mg total) by mouth 3 (three) times daily as needed for anxiety or itching. 05/15/22   Paige, Victoria J, DO  Lancets Misc. (  ACCU-CHEK SOFTCLIX LANCET DEV) KIT Use as instructed to test sugars once daily.  Dx code:E11.9 11/30/18   Scarlet Elsie LABOR, MD  meloxicam  (MOBIC ) 7.5 MG tablet Take 1 tablet (7.5 mg total) by mouth daily. 09/18/23   Gaetano Pac, MD  mometasone -formoterol  (DULERA ) 200-5 MCG/ACT AERO Inhale 2 puffs into the lungs 2 (two) times daily. 04/07/22 07/31/24  Onuoha, Josephine C, NP  OneTouch Delica Lancets 33G MISC 1 Units by Does not apply route daily. 01/29/20   Shirley, Jordan, DO  Semaglutide , 2 MG/DOSE, (OZEMPIC , 2 MG/DOSE,) 8 MG/3ML SOPN Inject 2 mg into the  skin once a week. 05/15/22   Paige, Victoria J, DO  traZODone  (DESYREL ) 50 MG tablet Take 1 tablet (50 mg total) by mouth at bedtime for 14 days. 04/07/22 07/31/24  Onuoha, Josephine C, NP    Allergies: Patient has no known allergies.    Review of Systems  Cardiovascular:  Positive for chest pain.  All other systems reviewed and are negative.   Updated Vital Signs BP (!) 172/79   Pulse 92   Temp 98.5 F (36.9 C)   Resp 16   LMP 12/03/2016 (Approximate)   SpO2 96%   Physical Exam Vitals and nursing note reviewed.   58 year old female, resting comfortably and in no acute distress. Vital signs are significant for elevated blood pressure. Oxygen saturation is 96%, which is normal. Head is normocephalic and atraumatic. PERRLA, EOMI.  Neck is nontender and supple without adenopathy or JVD. Lungs are clear without rales, wheezes, or rhonchi. Chest is moderately tender in the left anterior chest wall.  There is no crepitus. Heart has regular rate and rhythm without murmur. Abdomen is soft, flat, nontender Extremities have no cyanosis or edema Skin is warm and dry without rash. Neurologic: Mental status is normal, cranial nerves are intact, moves all extremities equally.  (all labs ordered are listed, but only abnormal results are displayed) Labs Reviewed  BASIC METABOLIC PANEL WITH GFR - Abnormal; Notable for the following components:      Result Value   Glucose, Bld 168 (*)    All other components within normal limits  CBC - Abnormal; Notable for the following components:   WBC 15.5 (*)    All other components within normal limits  TROPONIN T, HIGH SENSITIVITY  TROPONIN T, HIGH SENSITIVITY    EKG: EKG Interpretation Date/Time:  Tuesday August 15 2024 03:40:56 EST Ventricular Rate:  91 PR Interval:  118 QRS Duration:  95 QT Interval:  392 QTC Calculation: 483 R Axis:   19  Text Interpretation: Sinus rhythm Atrial premature complex Borderline short PR interval Low  voltage, precordial leads Posterior infarct, old Baseline wander in lead(s) V1 When compared with ECG of 09/18/2023, No significant change was found Confirmed by Raford Lenis ([id number]) on 08/15/2024 4:21:17 AM  Radiology: DG Chest 2 View Result Date: 08/15/2024 EXAM: 2 VIEW(S) XRAY OF THE CHEST 08/15/2024 04:00:00 AM COMPARISON: 10/02/2021. CLINICAL HISTORY: chest pain FINDINGS: LUNGS AND PLEURA: Low lung volumes. Similar bilateral linear lung opacities likely due to scarring. No pleural effusion. No pneumothorax. HEART AND MEDIASTINUM: No acute abnormality of the cardiac and mediastinal silhouettes. BONES AND SOFT TISSUES: Moderate degenerative changes of the thoracic spine. No acute osseous abnormality. IMPRESSION: 1. No acute cardiopulmonary findings. Electronically signed by: Waddell Calk MD 08/15/2024 05:16 AM EST RP Workstation: [id number]     Procedures   Medications Ordered in the ED  predniSONE  (DELTASONE ) tablet 60 mg (has no administration in time range)  ketorolac  (TORADOL ) 30 MG/ML injection 30 mg (30 mg Intravenous Given 08/15/24 0602)                                    Medical Decision Making Amount and/or Complexity of Data Reviewed Labs: ordered. Radiology: ordered.  Risk Prescription drug management.   Chest pain in the setting of cough.  This a presentation with wide range of treatment options and carries with it a high risk of morbidity and complications.  Differential diagnosis includes, but is not limited to, ACS, pneumonia, pleurisy, pericarditis, pulmonary embolism.  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  Chest x-ray shows no acute cardiopulmonary process.  I have independently viewed the images, and agree with the radiologists interpretation.  I have reviewed her laboratory tests, and my interpretation is mild leukocytosis which  is nonspecific, mildly elevated glucose level and otherwise normal basic metabolic panel, normal initial troponin.  I have ordered a D-dimer to rule out pulmonary embolism.  I have ordered a dose of ketorolac  for pain.  D-dimer is normal-no evidence of pulmonary embolism.  Respiratory pathogen panel is negative for COVID-19 and influenza and RSV.  She had only slight relief with ketorolac .  I am discharging her with prescriptions for azithromycin, prednisone  (initial dose of prednisone  given in the emergency department) and a small number of oxycodone  tablets for breakthrough pain.  I have advised her to primarily control pain with ice and over-the-counter NSAIDs and acetaminophen .  Follow-up with PCP in 1 week.     Final diagnoses:  Acute bronchitis, unspecified organism  Left-sided chest wall pain    ED Discharge Orders          Ordered    azithromycin (ZITHROMAX) 250 MG tablet  Daily        08/15/24 0750    predniSONE  (DELTASONE ) 50 MG tablet  Daily        08/15/24 0750    oxyCODONE  (ROXICODONE ) 5 MG immediate release tablet  Every 4 hours PRN        08/15/24 0750               Raford Lenis, MD 08/15/24 [phone]

## 2024-08-15 NOTE — ED Triage Notes (Signed)
 Pt reports with left sided chest pain that started 3 days ago. Pt states that it radiates down her left arm and right shoulder.

## 2024-08-15 NOTE — Discharge Instructions (Signed)
 Apply ice to where he you have pain in your chest and shoulder.  Ice should be applied for 30 minutes at a time, 4 times a day.  Take acetaminophen  and/or ibuprofen  as needed for pain.  When you combine acetaminophen  and ibuprofen , you will get better pain relief than you get from taking either medication by itself.  You may take oxycodone  for pain not relieved by the combination of acetaminophen  and ibuprofen .  Continue using your inhaler as needed.  Return to the emergency department if symptoms or not being adequately controlled at home.  Do not resume smoking.  It can take several months after you stop smoking for the damage to start reversing itself.
